# Patient Record
Sex: Female | Born: 1950 | Race: White | Hispanic: No | Smoking: Former smoker
Health system: Southern US, Community
[De-identification: ages and names within clinical notes are randomized; demographics above are authoritative.]

## PROBLEM LIST (undated history)

## (undated) DIAGNOSIS — R519 Headache, unspecified: Secondary | ICD-10-CM

## (undated) DIAGNOSIS — G459 Transient cerebral ischemic attack, unspecified: Secondary | ICD-10-CM

## (undated) DIAGNOSIS — G4730 Sleep apnea, unspecified: Secondary | ICD-10-CM

## (undated) DIAGNOSIS — F329 Major depressive disorder, single episode, unspecified: Secondary | ICD-10-CM

## (undated) DIAGNOSIS — I5030 Unspecified diastolic (congestive) heart failure: Secondary | ICD-10-CM

## (undated) DIAGNOSIS — M1990 Unspecified osteoarthritis, unspecified site: Secondary | ICD-10-CM

## (undated) DIAGNOSIS — E119 Type 2 diabetes mellitus without complications: Secondary | ICD-10-CM

## (undated) DIAGNOSIS — C801 Malignant (primary) neoplasm, unspecified: Secondary | ICD-10-CM

## (undated) DIAGNOSIS — I10 Essential (primary) hypertension: Secondary | ICD-10-CM

## (undated) DIAGNOSIS — C50919 Malignant neoplasm of unspecified site of unspecified female breast: Secondary | ICD-10-CM

## (undated) DIAGNOSIS — I4719 Other supraventricular tachycardia: Secondary | ICD-10-CM

## (undated) DIAGNOSIS — E785 Hyperlipidemia, unspecified: Secondary | ICD-10-CM

## (undated) DIAGNOSIS — I639 Cerebral infarction, unspecified: Secondary | ICD-10-CM

## (undated) DIAGNOSIS — I4891 Unspecified atrial fibrillation: Secondary | ICD-10-CM

## (undated) DIAGNOSIS — F32A Depression, unspecified: Secondary | ICD-10-CM

## (undated) DIAGNOSIS — R51 Headache: Secondary | ICD-10-CM

## (undated) DIAGNOSIS — E118 Type 2 diabetes mellitus with unspecified complications: Secondary | ICD-10-CM

## (undated) HISTORY — DX: Depression, unspecified: F32.A

## (undated) HISTORY — DX: Transient cerebral ischemic attack, unspecified: G45.9

## (undated) HISTORY — PX: CHOLECYSTECTOMY: SHX55

## (undated) HISTORY — DX: Unspecified osteoarthritis, unspecified site: M19.90

## (undated) HISTORY — DX: Hyperlipidemia, unspecified: E78.5

## (undated) HISTORY — DX: Type 2 diabetes mellitus without complications: E11.9

## (undated) HISTORY — PX: HERNIA REPAIR: SHX51

## (undated) HISTORY — DX: Sleep apnea, unspecified: G47.30

## (undated) HISTORY — DX: Unspecified atrial fibrillation: I48.91

## (undated) HISTORY — PX: WRIST SURGERY: SHX841

## (undated) HISTORY — DX: Essential (primary) hypertension: I10

## (undated) HISTORY — DX: Malignant neoplasm of unspecified site of unspecified female breast: C50.919

## (undated) HISTORY — DX: Type 2 diabetes mellitus with unspecified complications: E11.8

## (undated) HISTORY — PX: FOOT SURGERY: SHX648

## (undated) HISTORY — PX: TUBAL LIGATION: SHX77

## (undated) HISTORY — DX: Cerebral infarction, unspecified: I63.9

## (undated) HISTORY — DX: Major depressive disorder, single episode, unspecified: F32.9

## (undated) HISTORY — PX: ABDOMINAL HYSTERECTOMY: SHX81

---
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
## 2001-05-24 ENCOUNTER — Observation Stay (HOSPITAL_COMMUNITY): Admission: AD | Admit: 2001-05-24 | Discharge: 2001-05-25 | Payer: Self-pay | Admitting: *Deleted

## 2001-05-24 ENCOUNTER — Encounter: Payer: Self-pay | Admitting: *Deleted

## 2010-04-15 ENCOUNTER — Inpatient Hospital Stay (HOSPITAL_COMMUNITY): Admission: EM | Admit: 2010-04-15 | Discharge: 2010-04-19 | Payer: Self-pay | Admitting: Emergency Medicine

## 2011-01-12 LAB — COMPREHENSIVE METABOLIC PANEL
ALT: 17 U/L (ref 0–35)
AST: 19 U/L (ref 0–37)
AST: 25 U/L (ref 0–37)
Albumin: 2.8 g/dL — ABNORMAL LOW (ref 3.5–5.2)
Albumin: 3 g/dL — ABNORMAL LOW (ref 3.5–5.2)
Albumin: 3.2 g/dL — ABNORMAL LOW (ref 3.5–5.2)
Alkaline Phosphatase: 87 U/L (ref 39–117)
BUN: 9 mg/dL (ref 6–23)
BUN: 9 mg/dL (ref 6–23)
Calcium: 8.7 mg/dL (ref 8.4–10.5)
Calcium: 9 mg/dL (ref 8.4–10.5)
Chloride: 104 mEq/L (ref 96–112)
Creatinine, Ser: 0.71 mg/dL (ref 0.4–1.2)
Creatinine, Ser: 0.76 mg/dL (ref 0.4–1.2)
Creatinine, Ser: 0.91 mg/dL (ref 0.4–1.2)
GFR calc Af Amer: >60 mL/min (ref >60)
GFR calc Af Amer: >60 mL/min (ref >60)
GFR calc non Af Amer: >60 mL/min (ref >60)
Total Protein: 7.1 g/dL (ref 6.0–8.3)
Total Protein: 7.8 g/dL (ref 6.0–8.3)

## 2011-01-12 LAB — CBC
HCT: 33.1 % — ABNORMAL LOW (ref 36.0–46.0)
HCT: 33.6 % — ABNORMAL LOW (ref 36.0–46.0)
HCT: 36.9 % (ref 36.0–46.0)
HCT: 37.7 % (ref 36.0–46.0)
Hemoglobin: 11.6 g/dL — ABNORMAL LOW (ref 12.0–15.0)
Hemoglobin: 12.9 g/dL (ref 12.0–15.0)
MCH: 29.5 pg (ref 26.0–34.0)
MCHC: 33.9 g/dL (ref 30.0–36.0)
MCHC: 34 g/dL (ref 30.0–36.0)
MCHC: 34.2 g/dL (ref 30.0–36.0)
MCV: 85.7 fL (ref 78.0–100.0)
MCV: 85.7 fL (ref 78.0–100.0)
MCV: 86.3 fL (ref 78.0–100.0)
MCV: 86.5 fL (ref 78.0–100.0)
Platelets: 174 10*3/uL (ref 150–400)
Platelets: 179 10*3/uL (ref 150–400)
Platelets: 204 10*3/uL (ref 150–400)
Platelets: 223 10*3/uL (ref 150–400)
RBC: 3.95 MIL/uL (ref 3.87–5.11)
RBC: 4.4 MIL/uL (ref 3.87–5.11)
RDW: 12.9 % (ref 11.5–15.5)
RDW: 13 % (ref 11.5–15.5)
RDW: 13.1 % (ref 11.5–15.5)
RDW: 13.1 % (ref 11.5–15.5)
WBC: 16.5 10*3/uL — ABNORMAL HIGH (ref 4.0–10.5)

## 2011-01-12 LAB — CULTURE, BLOOD (ROUTINE X 2)
Culture: NO GROWTH
Report Status: 6252011

## 2011-01-12 LAB — LIPID PANEL
Cholesterol: 195 mg/dL (ref 0–200)
HDL: 41 mg/dL (ref >39)
LDL Cholesterol: 125 mg/dL — ABNORMAL HIGH (ref 0–99)
Total CHOL/HDL Ratio: 4.8 RATIO
Triglycerides: 144 mg/dL (ref <150)

## 2011-01-12 LAB — DIFFERENTIAL
Basophils Absolute: 0 10*3/uL (ref 0.0–0.1)
Basophils Absolute: 0.1 10*3/uL (ref 0.0–0.1)
Basophils Relative: 0 % (ref 0–1)
Basophils Relative: 0 % (ref 0–1)
Basophils Relative: 1 % (ref 0–1)
Eosinophils Absolute: 0 10*3/uL (ref 0.0–0.7)
Eosinophils Absolute: 0.1 10*3/uL (ref 0.0–0.7)
Eosinophils Relative: 0 % (ref 0–5)
Eosinophils Relative: 0 % (ref 0–5)
Eosinophils Relative: 2 % (ref 0–5)
Lymphocytes Relative: 17 % (ref 12–46)
Lymphocytes Relative: 9 % — ABNORMAL LOW (ref 12–46)
Lymphs Abs: 1.4 10*3/uL (ref 0.7–4.0)
Lymphs Abs: 1.8 10*3/uL (ref 0.7–4.0)
Lymphs Abs: 2.2 10*3/uL (ref 0.7–4.0)
Monocytes Absolute: 0.8 10*3/uL (ref 0.1–1.0)
Monocytes Absolute: 1 10*3/uL (ref 0.1–1.0)
Monocytes Absolute: 1.4 10*3/uL — ABNORMAL HIGH (ref 0.1–1.0)
Monocytes Relative: 11 % (ref 3–12)
Monocytes Relative: 8 % (ref 3–12)
Neutro Abs: 5.3 10*3/uL (ref 1.7–7.7)
Neutro Abs: 9.5 10*3/uL — ABNORMAL HIGH (ref 1.7–7.7)
Neutrophils Relative %: 61 % (ref 43–77)

## 2011-01-12 LAB — BASIC METABOLIC PANEL
BUN: 6 mg/dL (ref 6–23)
CO2: 28 mEq/L (ref 19–32)
Calcium: 8.8 mg/dL (ref 8.4–10.5)
Creatinine, Ser: 0.84 mg/dL (ref 0.4–1.2)
Creatinine, Ser: 0.89 mg/dL (ref 0.4–1.2)
GFR calc non Af Amer: >60 mL/min (ref >60)
Glucose, Bld: 118 mg/dL — ABNORMAL HIGH (ref 70–99)
Glucose, Bld: 147 mg/dL — ABNORMAL HIGH (ref 70–99)
Potassium: 3.5 mEq/L (ref 3.5–5.1)

## 2011-01-12 LAB — URINALYSIS, ROUTINE W REFLEX MICROSCOPIC
Glucose, UA: NEGATIVE mg/dL
Ketones, ur: 15 mg/dL — AB
Leukocytes, UA: NEGATIVE
Nitrite: NEGATIVE
Protein, ur: 100 mg/dL — AB
Specific Gravity, Urine: 1.025 (ref 1.005–1.030)
Urobilinogen, UA: 1 mg/dL (ref 0.0–1.0)
pH: 7.5 (ref 5.0–8.0)

## 2011-01-12 LAB — URINE MICROSCOPIC-ADD ON

## 2011-03-14 NOTE — Cardiovascular Report (Signed)
[place]. [facility]  Patient:    Diana Mathews Visit Number: [id number] MRN: [id number]          Service Type: Attending:  Arturo Morton. Riley Kill, M.D. [facility] Dictated by:   Arturo Morton. Riley Kill, M.D. [facility] Proc. Date: 05/25/01   CC:         CV Laboratory  Luis Abed, M.D. [facility]   Cardiac Catheterization  INDICATIONS: The patient is a 60 year old with systemic lupus, who presented in February with an acute inferior MI. She was treated with primary angioplasty with stenting. She has done well but continued to smoke. She presented tonight with acute onset of substernal chest pain associated with electrographic evidence of acute inferior wall infarction. She was transferred from the [facility] Emergency Room by [facility] to the cardiac catheterization for emergency treatment and evaluation. Risks and alternatives were discussed with the patient as she was rolled onto the table. She was agreeable to proceed.  PROCEDURES: 1. Left heart catheterization. 2. Selective coronary arteriography. 3. Selective left ventriculography. 4. Percutaneous angioplasty of the right coronary artery. 5. Temporary transvenous insertion.  DESCRIPTION OF PROCEDURE: The procedure was performed from the right femoral artery using 6 French catheters.  Initially, 6 French catheters were utilized. The patient had evidence of an occluded right coronary artery with bradycardia and first-degree AV block as well as some Mobitz I AV block. Because of this, a 6 French sheath was inserted in the right femoral vein. A temporary transvenous pacing wire was passed to the RV apex and tested and found to be appropriate. Following this, appropriate heparinization and Integrilin were given. A JR4 guiding catheter with side holes was utilized and the lesion was crossed with a 0.014 traverse guide wire. The lesion was dilated then using a 3.5 x 20 balloon and 3.5 x 30 balloon, there was a small amount of  residual thrombus noted distal to the stent site, but the RA was intact with good TIMI-3 flow. We elected not to intervene with any other device because of the lumen appeared to be widely patent. The temporary pacer was eventually removed. The patient was in atrial fibrillation, was given two doses of intravenous metoprolol, 5 mg as well as intravenous Lanoxin.  HEMODYNAMIC DATA: The central aortic pressure was 134/88, LV pressure was 128/19. There was no gradient on pullback across the aortic valve.  ANGIOGRAPHIC DATA: 1. The left main coronary artery had about 20% narrowing as previously noted. 2. The left anterior descending artery had about 20% narrowing in the    midportion of the vessel. There was mild irregularity at the ostium of the    circumflex. The LAD, ramus, and circumflex were otherwise free of    critical disease. 3. The right coronary artery was totally occluded at the proximal portion    of the stent. Distally, the vessel opened widely and provided a posterior    descending branch after reperfusion with the wire. The RCA was dilated to    less than 30% residual luminal narrowing.  VENTRICULOGRAPHY: Ventriculography in the RAO projection revealed severe inferior hypokinesis to akinesis. Overall, LV function was reduced but was done in setting of atrial fibrillation.  CONCLUSIONS: 1. Acute inferior wall infarction treated with primary percutaneous    angioplasty in a previously stented artery with subsequent acute    thrombosis. 2. Mild residual disease in the left system as noted above. 3. Systemic lupus erythematous, on multiple drug therapy.  DISPOSITION: The patient will be treated medically. Followup will  be with Dr. Myrtis Ser. Dictated by:   Arturo Morton Riley Kill, M.D. [facility] Attending:  Arturo Morton. Riley Kill, M.D. [facility] DD:  03/10/02 TD:  03/10/02 Job: [id number]

## 2011-03-14 NOTE — Discharge Summary (Signed)
[place]. [facility]  Patient:    Diana Mathews, Diana Mathews                  MRN: [id number] Adm. Date:  [id number] Disc. Date: 05/25/01 Attending:  Daisey Must Dictator:   Tereso Newcomer, P.A. CC:         Fara Chute, M.D.   Discharge Summary  DATE OF BIRTH:  1951-06-13  DISCHARGE DIAGNOSES: 1. Chest pain, etiology unclear. 2. Untreated hyperlipidemia. 3. Status post carpal tunnel surgery. 4. Status post cholecystectomy. 5. Status post hernia repair. 6. Status post tubal ligation. 7. Status post surgery to the left foot. 8. Ex-smoker. 9. Positive family history of coronary artery disease.  PROCEDURE:  Cardiac catheterization by Dr. Diona Browner on May 25, 2001 revealing no flow limiting coronary artery disease, no mitral regurgitation, no wall motion abnormalities, ejection fraction of 55%.  HOSPITAL COURSE:  This 60 year old female was admitted to [facility] on May 24, 2001 with new onset substernal chest pain.  She had been evaluated in the past for chest pain with Cardiolite in December 2001.  This test was negative with no evidence of ischemia.  She presented to [facility] with new onset chest pain.  She had pain throughout the weekend that was somewhat relieved by nitroglycerin.  She denied any exertional dyspnea or chest pain.  The patient had no EKG changes on admission but her pain on admission was relieved by two sublingual nitroglycerin.  PHYSICAL EXAMINATION  VITAL SIGNS:  Blood pressure was 123/84, pulse 78.  NECK:  Without bruits or JVD.  LUNGS:  Clear to auscultation bilaterally.  CARDIAC:  Regular rate and rhythm.  Normal S1, S2.  No murmurs, rubs, or gallops.  ABDOMEN:  Soft, nontender.  No rebound or guarding.  Good bowel sounds.  No abdominal bruits.  EXTREMITIES:  Without edema.  LABORATORIES:  EKG:  Normal sinus rhythm without acute ischemic changes. Chest x-ray:  No cardiomegaly.  No infiltrates.   Normal chest x-ray.  At [facility]:  CPK #1 50, CK-MB 1.6, troponin I 0.01.  CPK #2 89, CK-MB 0.6, troponin I 0.02.  White blood cell count 7200, hemoglobin 12.9, hematocrit 37.4, platelet count 279,000.  INR 1.2.  Sodium 134, potassium 3.6, chloride 102, CO2 27, glucose 134, BUN 15, creatinine 0.9.  The patient was transferred to [facility] for further evaluation.  On May 25, 2001 she had a cardiac catheterization performed by Dr. Diona Browner. The results are noted above.  She tolerated the procedure well, had no immediate complications.  Since her cardiac catheterization was negative, it was felt she was stable enough for discharge to home once her rest period was complete.  She would need to follow-up with her primary care physician, Dr. Neita Carp.  The patient does note indigestion at nighttime.  She denies any water brash symptoms or increased belching.  She does drink a lot of caffeine and carbonated sodas.  She is being sent home today with a prescription for Protonix 40 mg q.d.  She will possibly need a GI evaluation with her primary care physician.  DISCHARGE MEDICATIONS: 1. Ambien p.r.n. 2. Naprosyn p.r.n. 3. Protonix 40 mg q.d.  ACTIVITY:  No driving, heavy lifting, exertion, or sex for three days.  DIET:  Low fat, low sodium.  WOUND CARE:  The patient should call our office in [facility] for any groin swelling, bleeding, or bruising.  FOLLOW-UP:  She should follow-up with Dr. Neita Carp in one to two weeks.  She should call for an appointment.  She should follow-up with Dr. Jens Som only as needed. DD:  05/25/01 TD:  05/25/01 Job: [id number]

## 2011-03-14 NOTE — Cardiovascular Report (Signed)
[place]. [facility]  Patient:    Diana Mathews, Diana Mathews                  MRN: [id number] Proc. Date: 05/25/01 Adm. Date:  [id number] Attending:  Daisey Must                        Cardiac Catheterization  DATE OF BIRTH:  1951-09-24  REFERRING PHYSICIAN:  Fara Chute, M.D.  Corinda Gubler CARDIOLOGIST:  Lewayne Bunting, M.D.  INDICATIONS:  The patient is a 60 year old woman, with a history of hypertension, who presents with progressive chest discomfort.  She underwent a myocardial perfusion study approximately six months ago that was normal, but her symptoms have progressed since that time and she is now referred for cardiac catheterization to define her coronary anatomy.  PROCEDURES PERFORMED:  Left heart catheterization, coronary angiography, left ventriculography.  DESCRIPTION OF PROCEDURE:  After informed consent was obtained, the patient as taken to the cardiac catheterization lab.  She was prepped and draped in the usual sterile fashion and the area about the right femoral artery was anesthetized with 1% lidocaine.  A 6 French sheath was placed in the femoral artery via the modified Seldinger technique and selective coronary angiography was performed using JR4 and JL4 catheters.  A left ventriculogram was performed using a angled pigtail catheter.  The patient tolerated the procedure well without obvious complications.  HEMODYNAMICS:  LV 135/19 (post angiography), aorta 135/75.  ANGIOGRAPHIC FINDINGS: 1. The left main is free of significant flow-limiting coronary artery disease. 2. The left anterior descending is a medium caliber vessel with one    large diagonal branch.  There is no significant flow-limiting coronary    artery disease. 3. The circumflex coronary artery is a medium caliber vessel with two    obtuse marginal branches.  There is also a large atrial branch.  There is    no evidence of significant flow-limiting coronary artery  disease. 4. The right coronary artery is a large caliber vessel with a posterior    descending branch and a large posterolateral branch.  There is a small    area of kinking noted at the site of catheter engagement that may represent    an area of minor luminal irregularity at approximately 20% stenosis.  There    is no evidence of significant flow-limiting coronary artery disease.  LEFT VENTRICULOGRAM:  The left ventriculogram reveals a normal left ventricular ejection fraction of approximately 55% with no focal wall motion abnormalities.  There is no evidence of significant mitral regurgitation.  CONCLUSIONS AND RECOMMENDATIONS: 1. No evidence of significant flow-limiting coronary artery disease in the    major epicardial vessels. 2. Normal left ventricular contraction with an estimated ejection fraction of    55% and no focal wall motion abnormalities.  The left ventricular    end-diastolic pressure is elevated at 19 mmHg post angiography.  There is    no evidence of significant mitral regurgitation. 3. Would consider a possible gastroesophageal etiologies in terms of the    patients symptomatology. DD:  05/25/01 TD:  05/25/01 Job: [id number]

## 2016-04-15 ENCOUNTER — Ambulatory Visit (INDEPENDENT_AMBULATORY_CARE_PROVIDER_SITE_OTHER): Payer: BLUE CROSS/BLUE SHIELD | Admitting: Family Medicine

## 2016-04-15 ENCOUNTER — Encounter: Payer: Self-pay | Admitting: Family Medicine

## 2016-04-15 VITALS — BP 130/58 | HR 63 | Temp 98.3°F | Ht 64.0 in | Wt 261.0 lb

## 2016-04-15 DIAGNOSIS — M26621 Arthralgia of right temporomandibular joint: Secondary | ICD-10-CM

## 2016-04-15 DIAGNOSIS — F39 Unspecified mood [affective] disorder: Secondary | ICD-10-CM

## 2016-04-15 DIAGNOSIS — G4700 Insomnia, unspecified: Secondary | ICD-10-CM

## 2016-04-15 DIAGNOSIS — J040 Acute laryngitis: Secondary | ICD-10-CM

## 2016-04-15 DIAGNOSIS — I10 Essential (primary) hypertension: Secondary | ICD-10-CM | POA: Diagnosis not present

## 2016-04-15 MED ORDER — ZOLPIDEM TARTRATE 5 MG PO TABS: 5.0000 mg | ORAL_TABLET | Freq: Every evening | ORAL | Status: DC | PRN

## 2016-04-15 MED ORDER — METHYLPREDNISOLONE ACETATE 80 MG/ML IJ SUSP
80.0000 mg | Freq: Once | INTRAMUSCULAR | Status: AC
  Administered 2016-04-15: 80 mg via INTRAMUSCULAR

## 2016-04-15 NOTE — Patient Instructions (Signed)
Great to meet you!  I have given you the shot of steroids for your TMJ pain and the laryngitis, please come back or call if these get worse or do not resolve as expected  Temporomandibular Joint Syndrome Temporomandibular joint (TMJ) syndrome is a condition that affects the joints between your jaw and your skull. The TMJs are located near your ears and allow your jaw to open and close. These joints and the nearby muscles are involved in all movements of the jaw. People with TMJ syndrome have pain in the area of these joints and muscles. Chewing, biting, or other movements of the jaw can be difficult or painful. TMJ syndrome can be caused by various things. In many cases, the condition is mild and goes away within a few weeks. For some people, the condition can become a long-term problem. CAUSES Possible causes of TMJ syndrome include:  Grinding your teeth or clenching your jaw. Some people do this when they are under stress.  Arthritis.  Injury to the jaw.  Head or neck injury.  Teeth or dentures that are not aligned well. In some cases, the cause of TMJ syndrome may not be known. SIGNS AND SYMPTOMS The most common symptom is an aching pain on the side of the head in the area of the TMJ. Other symptoms may include:  Pain when moving your jaw, such as when chewing or biting.  Being unable to open your jaw all the way.  Making a clicking sound when you open your mouth.  Headache.  Earache.  Neck or shoulder pain. DIAGNOSIS Diagnosis can usually be made based on your symptoms, your medical history, and a physical exam. Your health care provider may check the range of motion of your jaw. Imaging tests, such as X-rays or an MRI, are sometimes done. You may need to see your dentist to determine if your teeth and jaw are lined up correctly. TREATMENT TMJ syndrome often goes away on its own. If treatment is needed, the options may include:  Eating soft foods and applying ice or  heat.  Medicines to relieve pain or inflammation.  Medicines to relax the muscles.  A splint, bite plate, or mouthpiece to prevent teeth grinding or jaw clenching.  Relaxation techniques or counseling to help reduce stress.  Transcutaneous electrical nerve stimulation (TENS). This helps to relieve pain by applying an electrical current through the skin.  Acupuncture. This is sometimes helpful to relieve pain.  Jaw surgery. This is rarely needed. HOME CARE INSTRUCTIONS  Take medicines only as directed by your health care provider.  Eat a soft diet if you are having trouble chewing.  Apply ice to the painful area.  Put ice in a plastic bag.  Place a towel between your skin and the bag.  Leave the ice on for 20 minutes, 2-3 times a day.  Apply a warm compress to the painful area as directed.  Massage your jaw area and perform any jaw stretching exercises as recommended by your health care provider.  If you were given a mouthpiece or bite plate, wear it as directed.  Avoid foods that require a lot of chewing. Do not chew gum.  Keep all follow-up visits as directed by your health care provider. This is important. SEEK MEDICAL CARE IF:  You are having trouble eating.  You have new or worsening symptoms. SEEK IMMEDIATE MEDICAL CARE IF:  Your jaw locks open or closed.   This information is not intended to replace advice given to you  by your health care provider. Make sure you discuss any questions you have with your health care provider.   Document Released: 07/08/2001 Document Revised: 11/03/2014 Document Reviewed: 05/18/2014     Laryngitis Laryngitis is inflammation of your vocal cords. This causes hoarseness, coughing, loss of voice, sore throat, or a dry throat. Your vocal cords are two bands of muscles that are found in your throat. When you speak, these cords come together and vibrate. These vibrations come out through your mouth as sound. When your vocal cords  are inflamed, your voice sounds different. Laryngitis can be temporary (acute) or long-term (chronic). Most cases of acute laryngitis improve with time. Chronic laryngitis is laryngitis that lasts for more than three weeks. CAUSES Acute laryngitis may be caused by:  A viral infection.  Lots of talking, yelling, or singing. This is also called vocal strain.  Bacterial infections. Chronic laryngitis may be caused by:  Vocal strain.  Injury to your vocal cords.  Acid reflux (gastroesophageal reflux disease or GERD).  Allergies.  Sinus infection.  Smoking.  Alcohol abuse.  Breathing in chemicals or dust.  Growths on the vocal cords. RISK FACTORS Risk factors for laryngitis include:  Smoking.  Alcohol abuse.  Having allergies. SIGNS AND SYMPTOMS Symptoms of laryngitis may include:  Low, hoarse voice.  Loss of voice.  Dry cough.  Sore throat.  Stuffy nose. DIAGNOSIS Laryngitis may be diagnosed by:  Physical exam.  Throat culture.  Blood test.  Laryngoscopy. This procedure allows your health care provider to look at your vocal cords with a mirror or viewing tube. TREATMENT Treatment for laryngitis depends on what is causing it. Usually, treatment involves resting your voice and using medicines to soothe your throat. However, if your laryngitis is caused by a bacterial infection, you may need to take antibiotic medicine. If your laryngitis is caused by a growth, you may need to have a procedure to remove it. HOME CARE INSTRUCTIONS  Drink enough fluid to keep your urine clear or pale yellow.  Breathe in moist air. Use a humidifier if you live in a dry climate.  Take medicines only as directed by your health care provider.  If you were prescribed an antibiotic medicine, finish it all even if you start to feel better.  Do not smoke cigarettes or electronic cigarettes. If you need help quitting, ask your health care provider.  Talk as little as possible.  Also avoid whispering, which can cause vocal strain.  Write instead of talking. Do this until your voice is back to normal. SEEK MEDICAL CARE IF:  You have a fever.  You have increasing pain.  You have difficulty swallowing. SEEK IMMEDIATE MEDICAL CARE IF:  You cough up blood.  You have trouble breathing.   This information is not intended to replace advice given to you by your health care provider. Make sure you discuss any questions you have with your health care provider.   Document Released: 10/13/2005 Document Revised: 11/03/2014 Document Reviewed: 03/28/2014 Elsevier Interactive Patient Education 2016 [employer].  Chartered certified accountant Patient Education [employer].

## 2016-04-15 NOTE — Progress Notes (Signed)
HPI  Patient presents today here to establish care with laryngitis and ear pain.  Hoarseness Is been going on for 9-10 days Does not seem to be getting better.  She's been seen in urgent care and treated with azithromycin with no improvement.  Ear pain Has been going on for about 2-3 months, worse in the morning and associated with dizziness. Describes her dizziness as unsteadiness, no room spinning. No headaches, shortness of breath, or history of stroke.  Hypertension Good medication compliance No chest pain, shortness of breath  Mood disorder Not really helped by fluoxetine  Insomnia Has been taking 5 mg of Ambien for years  PMH: Smoking status noted Past medical history significant for arthritis, depression, sleep apnea, and hypertension Surgical history positive for abdominal hysterectomy and cholecystectomy Family history positive for diabetes in mother, brother, and sister, brother with mental illness She is a never smoker ROS: Per HPI  Objective: BP 130/58 mmHg  Pulse 63  Temp(Src) 98.3 F (36.8 C) (Oral)  Ht 5\' 4"  (1.626 m)  Wt 261 lb (118.389 kg)  BMI 44.78 kg/m2 Gen: NAD, alert, cooperative with exam HEENT: NCAT, tenderness to palpation of right TMJ with mouth opening, TMs normal bilaterally, EOMI, PERRLA CV: RRR, good S1/S2, no murmur Resp: CTABL, no wheezes, non-labored Ext: No edema, warm Neuro: Alert and oriented,  strength 5/5 and sensation intact in bilateral upper and lower extremities, cranial nerves II through XII intact, normal gait   Depression screen PHQ 2/9 04/15/2016  Decreased Interest 0  Down, Depressed, Hopeless 0  PHQ - 2 Score 0      Assessment and plan:  # Temporomandibular joint arthralgia Discussed usual course of illness Discussed options of steroids, which may help her laryngitis, versus NSAIDs and ice She would like systemic steroids No contraindications obvious for systemic steroids   # Laryngitis No improvement  with azithromycin, discussed likely viral etiology Given 9 days with no improvement I have offered systemic steroids today IM Depo-Medrol   # Hypertension Well-controlled with metoprolol  # Insomnia Refilled Ambien, she's been using this for years     Meds ordered this encounter  Medications  . Multiple Vitamin (MULTIVITAMIN) tablet    Sig: Take 1 tablet by mouth daily.  . calcium-vitamin D (OSCAL WITH D) 500-200 MG-UNIT tablet    Sig: Take 1 tablet by mouth.  . Omega-3 Fatty Acids (FISH OIL) 1200 MG CAPS    Sig: Take 1,200 mg by mouth 2 (two) times daily.  . Black Cohosh 40 MG CAPS    Sig: Take 120 mg by mouth daily.  . vitamin B-12 (CYANOCOBALAMIN) 1000 MCG tablet    Sig: Take 1,000 mcg by mouth daily.  Marland Kitchen aspirin 81 MG tablet    Sig: Take 81 mg by mouth daily.  Marland Kitchen acetaminophen (TYLENOL) 500 MG tablet    Sig: Take 500 mg by mouth every 6 (six) hours as needed.  Marland Kitchen DISCONTD: zolpidem (AMBIEN) 5 MG tablet    Sig: Take 5 mg by mouth at bedtime as needed for sleep.  . cetirizine (ZYRTEC) 10 MG tablet    Sig: Take 10 mg by mouth at bedtime.  . metoprolol (LOPRESSOR) 50 MG tablet    Sig: Take 50 mg by mouth 2 (two) times daily.  Marland Kitchen FLUoxetine (PROZAC) 40 MG capsule    Sig: Take 40 mg by mouth daily.  . methylPREDNISolone acetate (DEPO-MEDROL) injection 80 mg    Sig:   . zolpidem (AMBIEN) 5 MG tablet  Sig: Take 1 tablet (5 mg total) by mouth at bedtime as needed for sleep.    Dispense:  30 tablet    Refill:  [place], MD [facility] 04/15/2016, 2:58 PM

## 2016-04-17 ENCOUNTER — Telehealth: Payer: Self-pay | Admitting: Family Medicine

## 2016-04-17 NOTE — Telephone Encounter (Signed)
Patient aware.

## 2016-04-17 NOTE — Telephone Encounter (Signed)
May need to be seen again if her symptoms are severe. Would recommend chest x ray if she returns.   Otherwise would expect gradual improvement over th enetx 3-5 days.   Laroy Apple, MD [facility] 04/17/2016, 1:27 PM

## 2016-05-16 ENCOUNTER — Telehealth: Payer: Self-pay | Admitting: Family Medicine

## 2016-05-16 ENCOUNTER — Other Ambulatory Visit: Payer: Self-pay | Admitting: *Deleted

## 2016-05-19 NOTE — Telephone Encounter (Signed)
Patient called and told 2 refills from 04/15/16  [facility] and straightened out

## 2016-05-26 ENCOUNTER — Telehealth: Payer: Self-pay | Admitting: Family Medicine

## 2016-05-26 MED ORDER — METOPROLOL TARTRATE 50 MG PO TABS: 50.0000 mg | ORAL_TABLET | Freq: Two times a day (BID) | ORAL | 1 refills | Status: DC

## 2016-06-06 ENCOUNTER — Ambulatory Visit (INDEPENDENT_AMBULATORY_CARE_PROVIDER_SITE_OTHER): Payer: BLUE CROSS/BLUE SHIELD | Admitting: Family Medicine

## 2016-06-06 ENCOUNTER — Encounter: Payer: Self-pay | Admitting: Family Medicine

## 2016-06-06 VITALS — BP 138/66 | HR 54 | Temp 97.7°F | Ht 64.0 in | Wt 250.8 lb

## 2016-06-06 DIAGNOSIS — L089 Local infection of the skin and subcutaneous tissue, unspecified: Secondary | ICD-10-CM

## 2016-06-06 DIAGNOSIS — L98492 Non-pressure chronic ulcer of skin of other sites with fat layer exposed: Secondary | ICD-10-CM

## 2016-06-06 MED ORDER — TRAMADOL HCL 50 MG PO TABS: 50.0000 mg | ORAL_TABLET | Freq: Three times a day (TID) | ORAL | 0 refills | Status: DC | PRN

## 2016-06-06 MED ORDER — DOXYCYCLINE HYCLATE 100 MG PO TABS: 100.0000 mg | ORAL_TABLET | Freq: Two times a day (BID) | ORAL | 0 refills | Status: DC

## 2016-06-06 NOTE — Progress Notes (Signed)
BP 138/66 (BP Location: Left Arm, Patient Position: Sitting, Cuff Size: Large)   Pulse (!) 54   Temp 97.7 F (36.5 C) (Oral)   Ht 5\' 4"  (1.626 m)   Wt 250 lb 12.8 oz (113.8 kg)   BMI 43.05 kg/m    Subjective:    Patient ID: Diana Mathews, female    DOB: 04-28-1951, 65 y.o.   MRN: [id number]  HPI: Diana Mathews is a 65 y.o. female presenting on 06/06/2016 for Insect Bite (happened last week - to right side of stomach, area is red and inflammed, draining)   HPI Insect bite and chills and aches Patient is coming today because she is having generalized aches and chills and not feeling well and she has an insect bite on her right abdomen that has been growing worse and she has been using some topical antibiotic on it to get some improvement but she is still just generally not feeling well. She does not know what bit her but she started to develop an ulceration and redness in her site. She says she was bitten about a week and a half to 2 weeks ago.  Relevant past medical, surgical, family and social history reviewed and updated as indicated. Interim medical history since our last visit reviewed. Allergies and medications reviewed and updated.  Review of Systems  Constitutional: Negative for chills and fever.  HENT: Negative for congestion, ear discharge and ear pain.   Eyes: Negative for redness and visual disturbance.  Respiratory: Negative for chest tightness and shortness of breath.   Cardiovascular: Negative for chest pain and leg swelling.  Genitourinary: Negative for difficulty urinating and dysuria.  Musculoskeletal: Negative for back pain and gait problem.  Skin: Positive for color change and wound. Negative for rash.  Neurological: Negative for light-headedness and headaches.  Psychiatric/Behavioral: Negative for agitation and behavioral problems.  All other systems reviewed and are negative.   Per HPI unless specifically indicated above     Medication List      Accurate as of 06/06/16  8:48 AM. Always use your most recent med list.          ALEVE 220 MG Caps Generic drug:  Naproxen Sodium Take 220 mg by mouth 2 (two) times daily.   aspirin 81 MG tablet Take 81 mg by mouth daily.   Black Cohosh 40 MG Caps Take 120 mg by mouth daily.   calcium-vitamin D 500-200 MG-UNIT tablet Commonly known as:  OSCAL WITH D Take 1 tablet by mouth.   cetirizine 10 MG tablet Commonly known as:  ZYRTEC Take 10 mg by mouth at bedtime.   doxycycline 100 MG tablet Commonly known as:  VIBRA-TABS Take 1 tablet (100 mg total) by mouth 2 (two) times daily.   Fish Oil 1200 MG Caps Take 1,200 mg by mouth 2 (two) times daily.   FLUoxetine 40 MG capsule Commonly known as:  PROZAC Take 40 mg by mouth daily.   metoprolol 50 MG tablet Commonly known as:  LOPRESSOR Take 1 tablet (50 mg total) by mouth 2 (two) times daily.   multivitamin tablet Take 1 tablet by mouth daily.   traMADol 50 MG tablet Commonly known as:  ULTRAM Take 1 tablet (50 mg total) by mouth every 8 (eight) hours as needed.   vitamin B-12 1000 MCG tablet Commonly known as:  CYANOCOBALAMIN Take 1,000 mcg by mouth daily.   zolpidem 5 MG tablet Commonly known as:  AMBIEN Take 1 tablet (5 mg total) by  mouth at bedtime as needed for sleep.          Objective:    BP 138/66 (BP Location: Left Arm, Patient Position: Sitting, Cuff Size: Large)   Pulse (!) 54   Temp 97.7 F (36.5 C) (Oral)   Ht 5\' 4"  (1.626 m)   Wt 250 lb 12.8 oz (113.8 kg)   BMI 43.05 kg/m   Wt Readings from Last 3 Encounters:  06/06/16 250 lb 12.8 oz (113.8 kg)  04/15/16 261 lb (118.4 kg)    Physical Exam  Constitutional: She is oriented to person, place, and time. She appears well-developed and well-nourished. No distress.  Eyes: Conjunctivae and EOM are normal. Pupils are equal, round, and reactive to light.  Cardiovascular: Normal rate, regular rhythm, normal heart sounds and intact distal pulses.   No  murmur heard. Pulmonary/Chest: Effort normal and breath sounds normal. No respiratory distress. She has no wheezes.  Musculoskeletal: Normal range of motion. She exhibits no edema or tenderness.  Neurological: She is alert and oriented to person, place, and time. Coordination normal.  Skin: Skin is warm and dry. Lesion (Patient has a 2.5 cm area of ulceration with surrounding erythema and induration and the ulceration appears to go at least centimeter deep. Some purulent drainage is noted.) noted. No rash noted. She is not diaphoretic. There is erythema.  Psychiatric: She has a normal mood and affect. Her behavior is normal.  Nursing note and vitals reviewed.     Assessment & Plan:   Problem List Items Addressed This Visit    None    Visit Diagnoses    Infected ulcer of skin, with fat layer exposed ([place])    -  Primary   Right abdominal wall, will do antibiotic for 1 week and if not improving may have to send to surgery   Relevant Medications   doxycycline (VIBRA-TABS) 100 MG tablet   traMADol (ULTRAM) 50 MG tablet       Follow up plan: Return in about 7 days (around 06/13/2016), or if symptoms worsen or fail to improve, for Follow-up skin ulcer.  Counseling provided for all of the vaccine components No orders of the defined types were placed in this encounter.   Caryl Pina, MD [facility] 06/06/2016, 8:48 AM

## 2016-06-11 ENCOUNTER — Telehealth: Payer: Self-pay | Admitting: Family Medicine

## 2016-06-11 NOTE — Telephone Encounter (Signed)
Pt aware.

## 2016-06-11 NOTE — Telephone Encounter (Signed)
Tell her she can take 2, double the dose. No pain medication will fully relieve the pain but this will hopefully just take the edge off.

## 2016-06-17 ENCOUNTER — Encounter: Payer: Self-pay | Admitting: Family Medicine

## 2016-06-17 ENCOUNTER — Ambulatory Visit (INDEPENDENT_AMBULATORY_CARE_PROVIDER_SITE_OTHER): Payer: BLUE CROSS/BLUE SHIELD | Admitting: Family Medicine

## 2016-06-17 VITALS — BP 135/74 | HR 68 | Temp 98.0°F | Ht 64.0 in | Wt 254.6 lb

## 2016-06-17 DIAGNOSIS — L98492 Non-pressure chronic ulcer of skin of other sites with fat layer exposed: Secondary | ICD-10-CM | POA: Diagnosis not present

## 2016-06-17 MED ORDER — TRAMADOL HCL 50 MG PO TABS: 50.0000 mg | ORAL_TABLET | Freq: Three times a day (TID) | ORAL | 0 refills | Status: DC | PRN

## 2016-06-17 NOTE — Progress Notes (Signed)
BP 135/74 (BP Location: Left Arm, Patient Position: Sitting, Cuff Size: Large)   Pulse 68   Temp 98 F (36.7 C) (Oral)   Ht 5\' 4"  (1.626 m)   Wt 254 lb 9.6 oz (115.5 kg)   BMI 43.70 kg/m    Subjective:    Patient ID: Diana Mathews, female    DOB: [birth year], 65 y.o.   MRN: [id number]  HPI: Diana Mathews is a 65 y.o. female presenting on 06/17/2016 for Followup skin ulcer of abdomen (finished Doxycycline yesterday)   HPI Wound care recheck Patient has significantly reduced in amount of redness and warmth and drainage coming out of it. She still has a small ulceration although smaller than it was previously on her right lower abdomen. She denies any fevers or chills. She denies any skin changes or rashes anywhere else. She finished the doxycycline just yesterday.  Relevant past medical, surgical, family and social history reviewed and updated as indicated. Interim medical history since our last visit reviewed. Allergies and medications reviewed and updated.  Review of Systems  Constitutional: Negative for chills and fever.  HENT: Negative for congestion, ear discharge and ear pain.   Eyes: Negative for redness and visual disturbance.  Respiratory: Negative for chest tightness and shortness of breath.   Cardiovascular: Negative for chest pain and leg swelling.  Genitourinary: Negative for difficulty urinating and dysuria.  Musculoskeletal: Negative for back pain and gait problem.  Skin: Positive for color change and wound. Negative for rash.  Neurological: Negative for light-headedness and headaches.  Psychiatric/Behavioral: Negative for agitation and behavioral problems.  All other systems reviewed and are negative.   Per HPI unless specifically indicated above     Medication List       Accurate as of 06/17/16  8:33 AM. Always use your most recent med list.          ALEVE 220 MG Caps Generic drug:  Naproxen Sodium Take 220 mg by mouth 2 (two) times daily.     aspirin 81 MG tablet Take 81 mg by mouth daily.   Black Cohosh 40 MG Caps Take 120 mg by mouth daily.   calcium-vitamin D 500-200 MG-UNIT tablet Commonly known as:  OSCAL WITH D Take 1 tablet by mouth.   cetirizine 10 MG tablet Commonly known as:  ZYRTEC Take 10 mg by mouth at bedtime.   Fish Oil 1200 MG Caps Take 1,200 mg by mouth 2 (two) times daily.   FLUoxetine 40 MG capsule Commonly known as:  PROZAC Take 40 mg by mouth daily.   metoprolol 50 MG tablet Commonly known as:  LOPRESSOR Take 1 tablet (50 mg total) by mouth 2 (two) times daily.   multivitamin tablet Take 1 tablet by mouth daily.   traMADol 50 MG tablet Commonly known as:  ULTRAM Take 1 tablet (50 mg total) by mouth every 8 (eight) hours as needed.   vitamin B-12 1000 MCG tablet Commonly known as:  CYANOCOBALAMIN Take 1,000 mcg by mouth daily.   zolpidem 5 MG tablet Commonly known as:  AMBIEN Take 1 tablet (5 mg total) by mouth at bedtime as needed for sleep.          Objective:    BP 135/74 (BP Location: Left Arm, Patient Position: Sitting, Cuff Size: Large)   Pulse 68   Temp 98 F (36.7 C) (Oral)   Ht 5\' 4"  (1.626 m)   Wt 254 lb 9.6 oz (115.5 kg)   BMI 43.70 kg/m  Wt Readings from Last 3 Encounters:  06/17/16 254 lb 9.6 oz (115.5 kg)  06/06/16 250 lb 12.8 oz (113.8 kg)  04/15/16 261 lb (118.4 kg)    Physical Exam  Constitutional: She is oriented to person, place, and time. She appears well-developed and well-nourished. No distress.  Eyes: Conjunctivae and EOM are normal. Pupils are equal, round, and reactive to light.  Cardiovascular: Normal rate, regular rhythm, normal heart sounds and intact distal pulses.   No murmur heard. Pulmonary/Chest: Effort normal and breath sounds normal. No respiratory distress. She has no wheezes.  Musculoskeletal: Normal range of motion. She exhibits no edema or tenderness.  Neurological: She is alert and oriented to person, place, and time.  Coordination normal.  Skin: Skin is warm and dry. Lesion (Patient has a 2cm area of ulceration with no surrounding erythema and is 0.5 centimeter deep. No purulent drainage is noted.) noted. No rash noted. She is not diaphoretic. There is erythema.  Psychiatric: She has a normal mood and affect. Her behavior is normal.  Nursing note and vitals reviewed.   Wound care: Place petroleum jelly covering/Xeroform and a nonstick gauze and tape that place. Change every 24 hours, return sooner if worsens.    Assessment & Plan:   Problem List Items Addressed This Visit    None    Visit Diagnoses    Skin ulcer, stage 3, with fat layer exposed ([place])    -  Primary   abdomen, infection cleared, patient given instructions for wound care   Relevant Medications   traMADol (ULTRAM) 50 MG tablet       Follow up plan: Return in about 2 weeks (around 07/01/2016), or if symptoms worsen or fail to improve, for Wound recheck.  Counseling provided for all of the vaccine components No orders of the defined types were placed in this encounter.   Caryl Pina, MD [facility] 06/17/2016, 8:33 AM

## 2016-07-02 ENCOUNTER — Encounter: Payer: Self-pay | Admitting: Family Medicine

## 2016-07-02 ENCOUNTER — Ambulatory Visit (INDEPENDENT_AMBULATORY_CARE_PROVIDER_SITE_OTHER): Payer: BLUE CROSS/BLUE SHIELD | Admitting: Family Medicine

## 2016-07-02 VITALS — BP 134/70 | HR 69 | Temp 97.3°F | Ht 64.0 in | Wt 255.0 lb

## 2016-07-02 DIAGNOSIS — G5601 Carpal tunnel syndrome, right upper limb: Secondary | ICD-10-CM | POA: Diagnosis not present

## 2016-07-02 DIAGNOSIS — L98492 Non-pressure chronic ulcer of skin of other sites with fat layer exposed: Secondary | ICD-10-CM | POA: Diagnosis not present

## 2016-07-02 NOTE — Progress Notes (Signed)
BP 134/70   Pulse 69   Temp 97.3 F (36.3 C) (Oral)   Ht 5\' 4"  (1.626 m)   Wt 255 lb (115.7 kg)   BMI 43.77 kg/m    Subjective:    Patient ID: Diana Mathews, female    DOB: [birth year], 65 y.o.   MRN: [id number]  HPI: Diana Mathews is a 65 y.o. female presenting on 07/02/2016 for Followup skin ulcer of abdomen and Pain and swelling in palm of right hand (no known injury)   HPI Recheck on skin ulcer of abdomen Patient is coming in for recheck on the wound on her abdomen. It has shrunken in size and has almost no depth now and she has continued to use Xeroform gauze. She denies any drainage out of it. It still is quite tender and irritated and is very pruritic now because of its healing. She denies any fevers or chills or ulcerations go anywhere else.  Hand pain right Patient has pain in her right hand over the palm on her thumb side. She also has some tingling going into her first 3 fingers on the right hand. She has had a known history of carpal tunnel syndrome and has had surgery bilaterally. This started flaring up within the past week. For work on a day-to-day basis she does a lot of housework and vacuuming and pushing and pulling things with that hand. This is her dominant hand. She denies any warmth or overlying skin changes. She denies any weakness  Relevant past medical, surgical, family and social history reviewed and updated as indicated. Interim medical history since our last visit reviewed. Allergies and medications reviewed and updated.  Review of Systems  Constitutional: Negative for chills and fever.  HENT: Negative for congestion, ear discharge and ear pain.   Eyes: Negative for redness and visual disturbance.  Respiratory: Negative for chest tightness and shortness of breath.   Cardiovascular: Negative for chest pain and leg swelling.  Genitourinary: Negative for difficulty urinating and dysuria.  Musculoskeletal: Positive for myalgias.  Skin: Positive for  color change and wound. Negative for rash.  Neurological: Positive for numbness. Negative for weakness, light-headedness and headaches.  Psychiatric/Behavioral: Negative for agitation and behavioral problems.  All other systems reviewed and are negative.   Per HPI unless specifically indicated above      Objective:    BP 134/70   Pulse 69   Temp 97.3 F (36.3 C) (Oral)   Ht 5\' 4"  (1.626 m)   Wt 255 lb (115.7 kg)   BMI 43.77 kg/m   Wt Readings from Last 3 Encounters:  07/02/16 255 lb (115.7 kg)  06/17/16 254 lb 9.6 oz (115.5 kg)  06/06/16 250 lb 12.8 oz (113.8 kg)    Physical Exam  Constitutional: She is oriented to person, place, and time. She appears well-developed and well-nourished. No distress.  Eyes: Conjunctivae are normal.  Cardiovascular: Normal rate, regular rhythm, normal heart sounds and intact distal pulses.   No murmur heard. Pulmonary/Chest: Effort normal and breath sounds normal. No respiratory distress. She has no wheezes.  Musculoskeletal: Normal range of motion. She exhibits no edema or tenderness.       Hands: Neurological: She is alert and oriented to person, place, and time. Coordination normal.  Skin: Skin is warm and dry. Lesion noted. No rash noted. She is not diaphoretic.     Psychiatric: She has a normal mood and affect. Her behavior is normal.  Nursing note and vitals reviewed.  Assessment & Plan:   Problem List Items Addressed This Visit    None    Visit Diagnoses    Skin ulcer, stage 3, with fat layer exposed ([place])    -  Primary   Half of the size that it was, continue using petroleum jelly in trouble antibiotic and keeping covered, return in 2 weeks   Carpal tunnel syndrome on right       Shooting pain into palm on thumb side with numbness in the first 3 fingers, use ice, splint, naproxen       Follow up plan: Return if symptoms worsen or fail to improve.  Counseling provided for all of the vaccine components No orders of the  defined types were placed in this encounter.   Caryl Pina, MD [facility] 07/02/2016, 8:44 AM

## 2016-07-14 ENCOUNTER — Other Ambulatory Visit: Payer: Self-pay

## 2016-07-14 MED ORDER — ZOLPIDEM TARTRATE 5 MG PO TABS: 5.0000 mg | ORAL_TABLET | Freq: Every evening | ORAL | 2 refills | Status: DC | PRN

## 2016-07-17 ENCOUNTER — Ambulatory Visit: Payer: BLUE CROSS/BLUE SHIELD | Admitting: Family Medicine

## 2016-07-25 ENCOUNTER — Ambulatory Visit (INDEPENDENT_AMBULATORY_CARE_PROVIDER_SITE_OTHER): Payer: BLUE CROSS/BLUE SHIELD | Admitting: Family Medicine

## 2016-07-25 ENCOUNTER — Encounter: Payer: Self-pay | Admitting: Family Medicine

## 2016-07-25 VITALS — BP 138/62 | HR 64 | Temp 97.4°F | Ht 64.0 in | Wt 251.0 lb

## 2016-07-25 DIAGNOSIS — G5601 Carpal tunnel syndrome, right upper limb: Secondary | ICD-10-CM | POA: Diagnosis not present

## 2016-07-25 DIAGNOSIS — L98499 Non-pressure chronic ulcer of skin of other sites with unspecified severity: Secondary | ICD-10-CM | POA: Diagnosis not present

## 2016-07-25 DIAGNOSIS — Z23 Encounter for immunization: Secondary | ICD-10-CM

## 2016-07-25 DIAGNOSIS — I10 Essential (primary) hypertension: Secondary | ICD-10-CM

## 2016-07-25 DIAGNOSIS — F39 Unspecified mood [affective] disorder: Secondary | ICD-10-CM

## 2016-07-25 DIAGNOSIS — M6530 Trigger finger, unspecified finger: Secondary | ICD-10-CM | POA: Diagnosis not present

## 2016-07-25 MED ORDER — METOPROLOL TARTRATE 50 MG PO TABS: 50.0000 mg | ORAL_TABLET | Freq: Two times a day (BID) | ORAL | 3 refills | Status: DC

## 2016-07-25 MED ORDER — ARIPIPRAZOLE 5 MG PO TABS: 5.0000 mg | ORAL_TABLET | Freq: Every day | ORAL | 3 refills | Status: DC

## 2016-07-25 MED ORDER — FLUOXETINE HCL 40 MG PO CAPS: 40.0000 mg | ORAL_CAPSULE | Freq: Every day | ORAL | 3 refills | Status: DC

## 2016-07-25 MED ORDER — CETIRIZINE HCL 10 MG PO TABS: 10.0000 mg | ORAL_TABLET | Freq: Every day | ORAL | 3 refills | Status: DC

## 2016-07-25 NOTE — Progress Notes (Signed)
   HPI  Patient presents today here for follow-up of chronic medical conditions.  Carpal tunnel syndrome Only mild improvement with cock-up splint, persistent symptoms in the right hand of the first through third fingers Described as pins and needle type pain. Feels that she has a swelling over the radial side of her wrist as well.  Trigger finger Intermittent for a few months on the right third finger.  Hypertension Good medication compliance, needs metoprolol refill.  Anxiety States that her "nerves have been really bad" for a few weeks. Also has severe depression. Denies suicidal thoughts Good Prozac compliance, not helping as much as it was previously  abd wall ulcer Healing, tolerating Neosporin well.  PMH: Smoking status noted ROS: Per HPI  Objective: BP 138/62   Pulse 64   Temp 97.4 F (36.3 C) (Oral)   Ht '5\' 4"'$  (1.626 m)   Wt 251 lb (113.9 kg)   BMI 43.08 kg/m  Gen: NAD, alert, cooperative with exam HEENT: NCAT CV: RRR, good S1/S2, no murmur Resp: CTABL, no wheezes, non-labored Ext: No edema, warm Neuro: Alert and oriented, No gross deficits  Wall ulcer Erythematous ring measuring 1.5 cm in diameter, central lesion with heme crusting measuring 5 mm.  Assessment and plan:  # Mood disorder Exacerbated, uncontrolled mixed anxiety and depression Continue Prozac, starting Abilify  # Hypertension Well-controlled on beta blocker Labs Refill Toprol  # Trigger finger New complaint, no triggering in clinic today Referring orthopedic surgery with carpal tunnel syndrome as well  # Tunnel syndrome Pins and needle type pain in the characteristic distribution of the right hand Mild improvement with cock-up splint Referring for further evaluation  Abdominal wall ulcer Improving, continue neopsporin    Orders Placed This Encounter  Procedures  . CMP14+EGFR  . CBC with Differential  . TSH  . LDL Cholesterol, Direct  . Ambulatory referral to  Orthopedic Surgery    Referral Priority:   Routine    Referral Type:   Surgical    Referral Reason:   Specialty Services Required    Requested Specialty:   Orthopedic Surgery    Number of Visits Requested:   1    Meds ordered this encounter  Medications  . ARIPiprazole (ABILIFY) 5 MG tablet    Sig: Take 1 tablet (5 mg total) by mouth daily.    Dispense:  30 tablet    Refill:  3  . FLUoxetine (PROZAC) 40 MG capsule    Sig: Take 1 capsule (40 mg total) by mouth daily.    Dispense:  90 capsule    Refill:  3  . metoprolol (LOPRESSOR) 50 MG tablet    Sig: Take 1 tablet (50 mg total) by mouth 2 (two) times daily.    Dispense:  180 tablet    Refill:  3  . cetirizine (ZYRTEC) 10 MG tablet    Sig: Take 1 tablet (10 mg total) by mouth at bedtime.    Dispense:  90 tablet    Refill:  Crystal [place], MD [facility] 07/25/2016, 3:27 PM

## 2016-07-25 NOTE — Patient Instructions (Addendum)
Great to see you!  Come back in 1 month to discuss anxiety and depression  Started you on Abilify, one half pill once daily for 2 weeks, then 1 pill once daily.  I have written a referral for orthopedic surgery for you to talk about your wrist pain.

## 2016-07-26 LAB — CBC WITH DIFFERENTIAL/PLATELET
Basophils Absolute: 0 10*3/uL (ref 0.0–0.2)
Basos: 0 %
EOS (ABSOLUTE): 0.2 10*3/uL (ref 0.0–0.4)
EOS: 3 %
Hematocrit: 38.7 % (ref 34.0–46.6)
Hemoglobin: 13.3 g/dL (ref 11.1–15.9)
IMMATURE GRANULOCYTES: 0 %
Immature Grans (Abs): 0 10*3/uL (ref 0.0–0.1)
LYMPHS: 44 %
Lymphocytes Absolute: 3.2 10*3/uL — ABNORMAL HIGH (ref 0.7–3.1)
MCH: 30.6 pg (ref 26.6–33.0)
MCHC: 34.4 g/dL (ref 31.5–35.7)
MCV: 89 fL (ref 79–97)
MONOS ABS: 0.5 10*3/uL (ref 0.1–0.9)
Monocytes: 8 %
NEUTROS PCT: 45 %
Neutrophils Absolute: 3.2 10*3/uL (ref 1.4–7.0)
PLATELETS: 229 10*3/uL (ref 150–379)
RBC: 4.35 x10E6/uL (ref 3.77–5.28)
RDW: 14.2 % (ref 12.3–15.4)
WBC: 7.1 10*3/uL (ref 3.4–10.8)

## 2016-07-26 LAB — CMP14+EGFR
ALK PHOS: 95 IU/L (ref 39–117)
ALT: 43 IU/L — AB (ref 0–32)
AST: 31 IU/L (ref 0–40)
Albumin/Globulin Ratio: 1.5 (ref 1.2–2.2)
Albumin: 4.4 g/dL (ref 3.6–4.8)
BUN/Creatinine Ratio: 29 — ABNORMAL HIGH (ref 12–28)
BUN: 20 mg/dL (ref 8–27)
Bilirubin Total: 0.3 mg/dL (ref 0.0–1.2)
CO2: 25 mmol/L (ref 18–29)
CREATININE: 0.68 mg/dL (ref 0.57–1.00)
Calcium: 9.6 mg/dL (ref 8.7–10.3)
Chloride: 103 mmol/L (ref 96–106)
GFR calc Af Amer: 108 mL/min/{1.73_m2} (ref >59)
GFR calc non Af Amer: 94 mL/min/{1.73_m2} (ref >59)
GLOBULIN, TOTAL: 3 g/dL (ref 1.5–4.5)
GLUCOSE: 110 mg/dL — AB (ref 65–99)
POTASSIUM: 4.7 mmol/L (ref 3.5–5.2)
SODIUM: 143 mmol/L (ref 134–144)
Total Protein: 7.4 g/dL (ref 6.0–8.5)

## 2016-07-26 LAB — TSH: TSH: 1.53 u[IU]/mL (ref 0.450–4.500)

## 2016-07-26 LAB — LDL CHOLESTEROL, DIRECT: LDL DIRECT: 144 mg/dL — AB (ref 0–99)

## 2016-08-26 ENCOUNTER — Encounter: Payer: Self-pay | Admitting: Family Medicine

## 2016-08-26 ENCOUNTER — Ambulatory Visit (INDEPENDENT_AMBULATORY_CARE_PROVIDER_SITE_OTHER): Payer: BLUE CROSS/BLUE SHIELD | Admitting: Family Medicine

## 2016-08-26 VITALS — BP 157/62 | HR 61 | Temp 97.0°F | Ht 64.0 in | Wt 257.6 lb

## 2016-08-26 DIAGNOSIS — F39 Unspecified mood [affective] disorder: Secondary | ICD-10-CM

## 2016-08-26 DIAGNOSIS — I10 Essential (primary) hypertension: Secondary | ICD-10-CM | POA: Diagnosis not present

## 2016-08-26 DIAGNOSIS — M5441 Lumbago with sciatica, right side: Secondary | ICD-10-CM

## 2016-08-26 DIAGNOSIS — E785 Hyperlipidemia, unspecified: Secondary | ICD-10-CM | POA: Diagnosis not present

## 2016-08-26 DIAGNOSIS — R7301 Impaired fasting glucose: Secondary | ICD-10-CM

## 2016-08-26 MED ORDER — ARIPIPRAZOLE 5 MG PO TABS: ORAL_TABLET | ORAL | 3 refills | Status: DC

## 2016-08-26 MED ORDER — PREDNISONE 10 MG PO TABS: ORAL_TABLET | ORAL | 0 refills | Status: DC

## 2016-08-26 NOTE — Patient Instructions (Signed)
Great to see you!  Come back in 1 month, I think if we stick with it we can get you good relief for some of your depression. Consider making a counseling appointment with your pastor

## 2016-08-26 NOTE — Progress Notes (Signed)
   HPI  Patient presents today here to follow-up for depression.  She states that she continues to take Abilify, however she does not see a difference. She has frequent thoughts of being better off dead but states that she wouldn't hurt herself. She contracts for safety. She states that most of her depression comes from being very busy, she "never gets a break". She has never had a vacation.  She takes her blood pressure medications daily. No chest pain, dyspnea, palpitations.  She does not think a colonoscopy, she will do of stool blood test. And she agrees to do colonoscopy if it's positive.  Versus 2 weeks of right-sided low back pain with sciatica The leg weakness, bowel or bladder dysfunction, or saddle anesthesia. No improvement with Aleve   PMH: Smoking status noted ROS: Per HPI  Objective: BP (!) 157/62   Pulse 61   Temp 97 F (36.1 C) (Oral)   Ht 5' 4" (1.626 m)   Wt 257 lb 9.6 oz (116.8 kg)   BMI 44.22 kg/m  Gen: NAD, alert, cooperative with exam HEENT: NCAT CV: RRR, good S1/S2, no murmur Resp: CTABL, no wheezes, non-labored Ext: No edema, warm Neuro: Alert and oriented, drink 5/5 and sensation intact in bilateral lower extremities  MSK Tenderness to palpation of right lower back over the paraspinal muscles and SI joint   Psych: Appropriate mood and affect, affect is actually not depressed or anxious at all, she is quite pleasant. Contracts for safety, denies suicidal thoughts but it does endorse feelings of being "better off that"  Depression screen [facility] 2/9 08/26/2016 07/25/2016 07/02/2016 06/17/2016 06/06/2016  Decreased Interest _0 0  Down, Depressed, Hopeless _1 0  PHQ - 2 Score _2 0  Altered sleeping _3 -  Tired, decreased energy _4 -  Change in appetite _5 -  Feeling bad or failure about yourself  _6 -  Trouble concentrating _7 -  Moving slowly or fidgety/restless 0 0 0 2 -  Suicidal thoughts 2 0 0 0 -  PHQ-9  Score _8 -  Difficult doing work/chores Very difficult Very difficult Somewhat difficult Somewhat difficult -     Assessment and plan:  # Mood disorder  No improvement with Abilify, tolerating well Titrated 10 mg Follow-up 3-4 weeks Consider changing Prozac to trintellix  # Hypertension Elevated today, continue following Continue beta blocker for now   # Hyperlipidemia Checking fasting labs today  # Right-sided low back pain with sciatica Prednisone course No red flags   Orders Placed This Encounter  Procedures  . CMP14+EGFR  . Lipid panel    Meds ordered this encounter  Medications  . ARIPiprazole (ABILIFY) 5 MG tablet    Sig: Take 1.5 tabs daily for 2 weeks then increase to 2 tabs daily    Dispense:  60 tablet    Refill:  3  . predniSONE (DELTASONE) 10 MG tablet    Sig: Take 4 pills a day for 3 days, then 3 pills a day for 3 days, then 2 pills a day for 3 days, then 1 pill a day for 3 days, then stop    Dispense:  30 tablet    Refill:  0    Laroy Apple, MD [facility] 08/26/2016, 8:55 AM

## 2016-08-27 LAB — LIPID PANEL
Chol/HDL Ratio: 3.1 ratio units (ref 0.0–4.4)
Cholesterol, Total: 190 mg/dL (ref 100–199)
HDL: 61 mg/dL (ref >39)
LDL Calculated: 100 mg/dL — ABNORMAL HIGH (ref 0–99)
TRIGLYCERIDES: 143 mg/dL (ref 0–149)
VLDL Cholesterol Cal: 29 mg/dL (ref 5–40)

## 2016-08-27 LAB — CMP14+EGFR
A/G RATIO: 1.3 (ref 1.2–2.2)
ALT: 52 IU/L — AB (ref 0–32)
AST: 29 IU/L (ref 0–40)
Albumin: 4.1 g/dL (ref 3.6–4.8)
Alkaline Phosphatase: 100 IU/L (ref 39–117)
BILIRUBIN TOTAL: 0.2 mg/dL (ref 0.0–1.2)
BUN/Creatinine Ratio: 23 (ref 12–28)
BUN: 14 mg/dL (ref 8–27)
CHLORIDE: 106 mmol/L (ref 96–106)
CO2: 25 mmol/L (ref 18–29)
Calcium: 9.2 mg/dL (ref 8.7–10.3)
Creatinine, Ser: 0.62 mg/dL (ref 0.57–1.00)
GFR calc Af Amer: 112 mL/min/{1.73_m2} (ref >59)
GFR calc non Af Amer: 97 mL/min/{1.73_m2} (ref >59)
Globulin, Total: 3.1 g/dL (ref 1.5–4.5)
Glucose: 116 mg/dL — ABNORMAL HIGH (ref 65–99)
POTASSIUM: 4.8 mmol/L (ref 3.5–5.2)
SODIUM: 147 mmol/L — AB (ref 134–144)
Total Protein: 7.2 g/dL (ref 6.0–8.5)

## 2016-08-28 ENCOUNTER — Other Ambulatory Visit: Payer: Self-pay

## 2016-08-28 DIAGNOSIS — R7301 Impaired fasting glucose: Secondary | ICD-10-CM

## 2016-08-28 LAB — BAYER DCA HB A1C WAIVED: HB A1C (BAYER DCA - WAIVED): 5.7 % (ref <7.0)

## 2016-08-28 NOTE — Addendum Note (Signed)
Addended by: Liliane Bade on: 08/28/2016 12:10 PM   Modules accepted: Orders

## 2016-09-05 ENCOUNTER — Other Ambulatory Visit: Payer: BLUE CROSS/BLUE SHIELD

## 2016-09-05 DIAGNOSIS — Z1212 Encounter for screening for malignant neoplasm of rectum: Secondary | ICD-10-CM

## 2016-09-07 LAB — FECAL OCCULT BLOOD, IMMUNOCHEMICAL: FECAL OCCULT BLD: NEGATIVE

## 2016-09-12 ENCOUNTER — Other Ambulatory Visit: Payer: Self-pay | Admitting: *Deleted

## 2016-09-12 MED ORDER — ZOLPIDEM TARTRATE 5 MG PO TABS: 5.0000 mg | ORAL_TABLET | Freq: Every evening | ORAL | 2 refills | Status: DC | PRN

## 2016-09-12 NOTE — Telephone Encounter (Signed)
Refill called in to [facility] for Ambien 5 mg #30, with 2 refills, patient notified

## 2016-09-12 NOTE — Telephone Encounter (Signed)
Last seen bradshaw - 08/26/16

## 2016-11-27 ENCOUNTER — Encounter: Payer: Self-pay | Admitting: Family Medicine

## 2016-11-27 ENCOUNTER — Ambulatory Visit (INDEPENDENT_AMBULATORY_CARE_PROVIDER_SITE_OTHER): Payer: BLUE CROSS/BLUE SHIELD | Admitting: Family Medicine

## 2016-11-27 VITALS — BP 150/68 | HR 76 | Temp 96.2°F | Ht 64.0 in | Wt 264.2 lb

## 2016-11-27 DIAGNOSIS — F39 Unspecified mood [affective] disorder: Secondary | ICD-10-CM

## 2016-11-27 DIAGNOSIS — R252 Cramp and spasm: Secondary | ICD-10-CM | POA: Diagnosis not present

## 2016-11-27 DIAGNOSIS — I10 Essential (primary) hypertension: Secondary | ICD-10-CM

## 2016-11-27 MED ORDER — GABAPENTIN 300 MG PO CAPS: 300.0000 mg | ORAL_CAPSULE | Freq: Every day | ORAL | 0 refills | Status: DC

## 2016-11-27 MED ORDER — AMLODIPINE BESYLATE 10 MG PO TABS: 10.0000 mg | ORAL_TABLET | Freq: Every day | ORAL | 3 refills | Status: DC

## 2016-11-27 NOTE — Patient Instructions (Signed)
Great to see you!  I have sent gabapentin for your leg cramps, take 1 capsule for 3 nights, if you are not seeing relief, try 2.   In 1 week start amlodipine, 1 pill once daily at night.   Come back in 3 months unless you need [place] sooner.

## 2016-11-27 NOTE — Progress Notes (Signed)
   HPI  Patient presents today here for chronic medical condition follow-up as well as milligrams.  Mood disorders doing much better, denies any suicidal thoughts or feelings of being better off dead. Good medication compliance with no side effects.  Hypertension routinely Q000111Q and 0000000 systolic, heart rate usually in the Q000111Q, diastolic usually the Q000111Q and 80s No headache, chest pain, palpitations, leg swelling.  Leg cramps Patient reports 3-4 weeks of bilateral leg pain and foot pain and leg cramps mostly at night. Denies any problems during the daytime. She has good relief with rinsing her legs with warm water and then placing socks on going back to bed.  She denies any serious back pain, she does have some mild right-sided low back pain in the paraspinal muscles.  PMH: Smoking status noted ROS: Per HPI  Objective: BP (!) 150/68   Pulse 76   Temp (!) 96.2 F (35.7 C) (Oral)   Ht 5\' 4"  (1.626 m)   Wt 264 lb 3.2 oz (119.8 kg)   BMI 45.35 kg/m  Gen: NAD, alert, cooperative with exam HEENT: NCAT CV: RRR, good S1/S2, no murmur Resp: CTABL, no wheezes, non-labored Ext: No edema, warm Neuro: Alert and oriented, No gross deficits  Msk:  Tenderness to palpation of paraspinal muscles and lumbar spine or midline spine Normal-appearing feet with 2+ dorsalis pedis pulses, sensation intact to monofilament throughout   Assessment and plan:  # Hypertension Uncontrolled Continue metoprolol, having amlodipine Follow-up 3 months  Amlodipine one week after start gabapentin  # Mood disorder Improved, denies suicidal thoughts Continue Abilify plus Prozac  # Leg cramps No clear etiology, new problem Good circulation in feet Trial of gabapentin, may titrate to 600 mg once at night in 3 days if not improved   Laroy Apple, MD [facility] 11/27/2016, 8:31 AM

## 2016-12-11 ENCOUNTER — Other Ambulatory Visit: Payer: Self-pay | Admitting: Family Medicine

## 2016-12-12 ENCOUNTER — Telehealth: Payer: Self-pay | Admitting: *Deleted

## 2016-12-12 NOTE — Telephone Encounter (Signed)
Ambien [facility] to [facility]

## 2016-12-19 ENCOUNTER — Ambulatory Visit (INDEPENDENT_AMBULATORY_CARE_PROVIDER_SITE_OTHER): Payer: BLUE CROSS/BLUE SHIELD | Admitting: Family Medicine

## 2016-12-19 ENCOUNTER — Encounter: Payer: Self-pay | Admitting: Family Medicine

## 2016-12-19 VITALS — BP 137/62 | HR 84 | Temp 98.3°F | Ht 64.0 in | Wt 270.0 lb

## 2016-12-19 DIAGNOSIS — J209 Acute bronchitis, unspecified: Secondary | ICD-10-CM | POA: Diagnosis not present

## 2016-12-19 LAB — VERITOR FLU A/B WAIVED
Influenza A: NEGATIVE
Influenza B: NEGATIVE

## 2016-12-19 MED ORDER — AZITHROMYCIN 250 MG PO TABS: ORAL_TABLET | ORAL | 0 refills | Status: DC

## 2016-12-19 NOTE — Progress Notes (Signed)
BP 137/62   Pulse 84   Temp 98.3 F (36.8 C) (Oral)   Ht 5\' 4"  (1.626 m)   Wt 270 lb (122.5 kg)   BMI 46.35 kg/m    Subjective:    Patient ID: Diana Mathews, female    DOB: 17-Jan-1951, 66 y.o.   MRN: [id number]  HPI: Diana Mathews is a 66 y.o. female presenting on 12/19/2016 for Generalized Body Aches; Fever; and Cough   HPI Cough and fever and body aches Patient has been having cough and fever and body aches is going on for the past 2 days. 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 She developed these symptoms starting 2 days ago and had a fever of 99 yesterday. She denies any fevers higher than that. She has been using Aleve which is helped with the achiness and the fever but not really using anything else to this point. She denies any shortness of breath but thinks she may have had some wheezing intermittently especially at night. The cough is especially worse at night as well.  Relevant past medical, surgical, family and social history reviewed and updated as indicated. Interim medical history since our last visit reviewed. Allergies and medications reviewed and updated.  Review of Systems  Constitutional: Positive for chills and fever.  HENT: Positive for congestion, postnasal drip, rhinorrhea, sinus pressure and sore throat. Negative for ear discharge, ear pain and sneezing.   Eyes: Negative for pain, redness and visual disturbance.  Respiratory: Positive for cough. Negative for chest tightness and shortness of breath.   Cardiovascular: Negative for chest pain and leg swelling.  Genitourinary: Negative for difficulty urinating and dysuria.  Musculoskeletal: Positive for myalgias. Negative for back pain and gait problem.  Skin: Negative for rash.  Neurological: Negative for light-headedness and headaches.  Psychiatric/Behavioral: Negative for agitation and behavioral problems.  All other systems  reviewed and are negative.   Per HPI unless specifically indicated above   Allergies as of 12/19/2016      Reactions   Codeine Rash      Medication List       Accurate as of 12/19/16  8:58 AM. Always use your most recent med list.          amLODipine 10 MG tablet Commonly known as:  NORVASC Take 1 tablet (10 mg total) by mouth daily.   ARIPiprazole 5 MG tablet Commonly known as:  ABILIFY Take 1.5 tabs daily for 2 weeks then increase to 2 tabs daily   aspirin 81 MG tablet Take 81 mg by mouth daily.   azithromycin 250 MG tablet Commonly known as:  ZITHROMAX Take 2 the first day and then one each day after.   cetirizine 10 MG tablet Commonly known as:  ZYRTEC Take 1 tablet (10 mg total) by mouth at bedtime.   FLUoxetine 40 MG capsule Commonly known as:  PROZAC Take 1 capsule (40 mg total) by mouth daily.   gabapentin 300 MG capsule Commonly known as:  NEURONTIN Take 1-2 capsules (300-600 mg total) by mouth at bedtime.   metoprolol 50 MG tablet Commonly known as:  LOPRESSOR Take 1 tablet (50 mg total) by mouth 2 (two) times daily.   vitamin B-12 1000 MCG tablet Commonly known as:  CYANOCOBALAMIN Take 1,000 mcg by mouth daily.   zolpidem 5 MG tablet Commonly known as:  AMBIEN TAKE ONE (1) TABLET EACH DAY  Objective:    BP 137/62   Pulse 84   Temp 98.3 F (36.8 C) (Oral)   Ht 5\' 4"  (1.626 m)   Wt 270 lb (122.5 kg)   BMI 46.35 kg/m   Wt Readings from Last 3 Encounters:  12/19/16 270 lb (122.5 kg)  11/27/16 264 lb 3.2 oz (119.8 kg)  08/26/16 257 lb 9.6 oz (116.8 kg)    Physical Exam  Constitutional: She is oriented to person, place, and time. She appears well-developed and well-nourished. No distress.  HENT:  Right Ear: Tympanic membrane, external ear and ear canal normal.  Left Ear: Tympanic membrane, external ear and ear canal normal.  Nose: Mucosal edema and rhinorrhea present. No epistaxis. Right sinus exhibits no maxillary sinus  tenderness and no frontal sinus tenderness. Left sinus exhibits no maxillary sinus tenderness and no frontal sinus tenderness.  Mouth/Throat: Uvula is midline and mucous membranes are normal. Posterior oropharyngeal edema and posterior oropharyngeal erythema present. No oropharyngeal exudate or tonsillar abscesses.  Eyes: Conjunctivae are normal.  Cardiovascular: Normal rate, regular rhythm, normal heart sounds and intact distal pulses.   No murmur heard. Pulmonary/Chest: Effort normal and breath sounds normal. No respiratory distress. She has no wheezes. She has no rales.  Musculoskeletal: Normal range of motion. She exhibits no edema or tenderness.  Neurological: She is alert and oriented to person, place, and time. Coordination normal.  Skin: Skin is warm and dry. No rash noted. She is not diaphoretic.  Psychiatric: She has a normal mood and affect. Her behavior is normal.  Vitals reviewed.   Rapid flu: Negative    Assessment & Plan:   Problem List Items Addressed This Visit    None    Visit Diagnoses    Acute bronchitis, unspecified organism    -  Primary   Relevant Medications   azithromycin (ZITHROMAX) 250 MG tablet   Other Relevant Orders   Veritor Flu A/B Waived       Follow up plan: Return if symptoms worsen or fail to improve.  Counseling provided for all of the vaccine components Orders Placed This Encounter  Procedures  . Veritor Flu A/B [place], MD [facility] 12/19/2016, 8:58 AM

## 2016-12-30 ENCOUNTER — Other Ambulatory Visit: Payer: Self-pay | Admitting: Family Medicine

## 2017-01-30 ENCOUNTER — Other Ambulatory Visit: Payer: Self-pay | Admitting: Family Medicine

## 2017-02-17 ENCOUNTER — Telehealth: Payer: Self-pay

## 2017-02-17 MED ORDER — ARIPIPRAZOLE 10 MG PO TABS: 10.0000 mg | ORAL_TABLET | Freq: Every day | ORAL | 3 refills | Status: DC

## 2017-02-17 NOTE — Telephone Encounter (Signed)
Order changed.   Laroy Apple, MD [facility] 02/17/2017, 12:27 PM

## 2017-02-26 ENCOUNTER — Ambulatory Visit: Payer: BLUE CROSS/BLUE SHIELD | Admitting: Family Medicine

## 2017-03-18 ENCOUNTER — Other Ambulatory Visit: Payer: Self-pay | Admitting: Family Medicine

## 2017-04-09 ENCOUNTER — Encounter: Payer: Self-pay | Admitting: Family Medicine

## 2017-04-09 ENCOUNTER — Ambulatory Visit (INDEPENDENT_AMBULATORY_CARE_PROVIDER_SITE_OTHER): Payer: BLUE CROSS/BLUE SHIELD | Admitting: Family Medicine

## 2017-04-09 VITALS — BP 152/71 | HR 63 | Temp 97.6°F | Ht 64.0 in | Wt 264.8 lb

## 2017-04-09 DIAGNOSIS — I10 Essential (primary) hypertension: Secondary | ICD-10-CM | POA: Diagnosis not present

## 2017-04-09 DIAGNOSIS — G4700 Insomnia, unspecified: Secondary | ICD-10-CM

## 2017-04-09 DIAGNOSIS — B349 Viral infection, unspecified: Secondary | ICD-10-CM

## 2017-04-09 MED ORDER — METOPROLOL TARTRATE 50 MG PO TABS: 50.0000 mg | ORAL_TABLET | Freq: Two times a day (BID) | ORAL | 3 refills | Status: DC

## 2017-04-09 MED ORDER — ZOLPIDEM TARTRATE 5 MG PO TABS: ORAL_TABLET | ORAL | 2 refills | Status: DC

## 2017-04-09 NOTE — Patient Instructions (Signed)
Great to see you!  Please call if you are having worsening symptoms. At this point I think you should gradually improve over the next 2 weeks.

## 2017-04-09 NOTE — Progress Notes (Signed)
   HPI  Patient presents today here for follow-up of chronic medical conditions also with headache and balance difficulty.  Patient explains she's had right-sided headache, off and on for about 2 weeks. He states that it's improved today, she also has had some unsteadiness of gait off and on for the same time. She reports 2-3 days of feeling ill with chills and fatigue. Patient states that she stayed in bed most the time during that time.  No cough, shortness of breath, chest pain.  She did have shortness of breath and racing heart with activity during this time. These are all improving at this time.  Sleep, doing well with Ambien, still has difficulty sleeping at night without Ambien.  Hypertension Good medication compliance, needs refill of metoprolol.  PMH: Smoking status noted ROS: Per HPI  Objective: BP (!) 152/71   Pulse 63   Temp 97.6 F (36.4 C) (Oral)   Ht 5\' 4"  (1.626 m)   Wt 264 lb 12.8 oz (120.1 kg)   BMI 45.45 kg/m  Gen: NAD, alert, cooperative with exam HEENT: NCAT, no tenderness to palpation over frontal or maxillary sinus, erythema in the nares, oropharynx clear, + tenderness to palpation over the right mastoid area as well as superior to the right ear. CV: RRR, good S1/S2, no murmur Resp: CTABL, no wheezes, non-labored Abd: SNTND, BS present, no guarding or organomegaly Ext: No edema, warm Neuro: Alert and oriented, No gross deficits  Assessment and plan:  # Viral illness Symptoms most consistent with viral illness, and concerned with mild mastoid tenderness. However her symptoms are improving I do not believe workup for mastoiditis is warranted today. I have recommended the patient keep a very low threshold for calling back, if her right sided head pain returns or if her symptoms like chills or fatigue return I would like to pursue CT scan of the head to rule out mastoiditis.   # Hypertension Doing well, slightly elevated today. Refilled metoprolol No  changes.  # Insomnia Doing well with Ambien, refilled Discussed refill only at [facility] ordered this encounter  Medications  . metoprolol tartrate (LOPRESSOR) 50 MG tablet    Sig: Take 1 tablet (50 mg total) by mouth 2 (two) times daily.    Dispense:  180 tablet    Refill:  3  . zolpidem (AMBIEN) 5 MG tablet    Sig: TAKE ONE (1) TABLET EACH DAY    Dispense:  30 tablet    Refill:  2    Laroy Apple, MD [facility] 04/09/2017, 8:53 AM

## 2017-05-19 ENCOUNTER — Other Ambulatory Visit: Payer: Self-pay | Admitting: Family Medicine

## 2017-07-02 ENCOUNTER — Other Ambulatory Visit: Payer: Self-pay | Admitting: Family Medicine

## 2017-07-03 NOTE — Telephone Encounter (Signed)
Last seen 04/09/17  Dr Wendi Snipes  If approved route to nurse to call into [facility]

## 2017-07-03 NOTE — Telephone Encounter (Signed)
Rx phoned in. Attempted to contact patient and busy x2. Drug store will let patient know she needs to be seen for any more refills.

## 2018-05-20 ENCOUNTER — Ambulatory Visit (INDEPENDENT_AMBULATORY_CARE_PROVIDER_SITE_OTHER): Payer: PRIVATE HEALTH INSURANCE | Admitting: Family Medicine

## 2018-05-20 ENCOUNTER — Ambulatory Visit (INDEPENDENT_AMBULATORY_CARE_PROVIDER_SITE_OTHER): Payer: PRIVATE HEALTH INSURANCE

## 2018-05-20 ENCOUNTER — Encounter: Payer: Self-pay | Admitting: Family Medicine

## 2018-05-20 VITALS — BP 134/76 | HR 82 | Temp 98.0°F | Ht 64.0 in | Wt 255.0 lb

## 2018-05-20 DIAGNOSIS — F339 Major depressive disorder, recurrent, unspecified: Secondary | ICD-10-CM | POA: Diagnosis not present

## 2018-05-20 DIAGNOSIS — M545 Low back pain, unspecified: Secondary | ICD-10-CM

## 2018-05-20 DIAGNOSIS — F39 Unspecified mood [affective] disorder: Secondary | ICD-10-CM

## 2018-05-20 DIAGNOSIS — L989 Disorder of the skin and subcutaneous tissue, unspecified: Secondary | ICD-10-CM | POA: Diagnosis not present

## 2018-05-20 MED ORDER — CYCLOBENZAPRINE HCL 10 MG PO TABS: 10.0000 mg | ORAL_TABLET | Freq: Every day | ORAL | 0 refills | Status: DC

## 2018-05-20 MED ORDER — NAPROXEN 500 MG PO TABS: 500.0000 mg | ORAL_TABLET | Freq: Two times a day (BID) | ORAL | 0 refills | Status: DC

## 2018-05-20 MED ORDER — MUPIROCIN 2 % EX OINT: 1.0000 "application " | TOPICAL_OINTMENT | Freq: Two times a day (BID) | CUTANEOUS | 0 refills | Status: DC

## 2018-05-20 MED ORDER — MIRTAZAPINE 15 MG PO TABS: 15.0000 mg | ORAL_TABLET | Freq: Every day | ORAL | 3 refills | Status: DC
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
## 2018-06-02 ENCOUNTER — Other Ambulatory Visit: Payer: Self-pay | Admitting: Family Medicine

## 2018-06-12 ENCOUNTER — Emergency Department (HOSPITAL_COMMUNITY): Payer: PRIVATE HEALTH INSURANCE

## 2018-06-12 ENCOUNTER — Encounter (HOSPITAL_COMMUNITY): Payer: Self-pay | Admitting: Emergency Medicine

## 2018-06-12 ENCOUNTER — Ambulatory Visit (INDEPENDENT_AMBULATORY_CARE_PROVIDER_SITE_OTHER): Payer: PRIVATE HEALTH INSURANCE | Admitting: Family Medicine

## 2018-06-12 ENCOUNTER — Other Ambulatory Visit: Payer: Self-pay

## 2018-06-12 ENCOUNTER — Observation Stay (HOSPITAL_COMMUNITY): Payer: PRIVATE HEALTH INSURANCE

## 2018-06-12 ENCOUNTER — Encounter: Payer: Self-pay | Admitting: Family Medicine

## 2018-06-12 ENCOUNTER — Inpatient Hospital Stay (HOSPITAL_COMMUNITY)
Admission: EM | Admit: 2018-06-12 | Discharge: 2018-06-14 | DRG: 069 | Disposition: A | Discharge status: Home / Self Care | Payer: PRIVATE HEALTH INSURANCE | Attending: Family Medicine | Admitting: Family Medicine

## 2018-06-12 VITALS — BP 161/86 | HR 136 | Temp 98.2°F | Ht 64.0 in | Wt 254.5 lb

## 2018-06-12 DIAGNOSIS — Z713 Dietary counseling and surveillance: Secondary | ICD-10-CM

## 2018-06-12 DIAGNOSIS — G459 Transient cerebral ischemic attack, unspecified: Secondary | ICD-10-CM | POA: Diagnosis not present

## 2018-06-12 DIAGNOSIS — I10 Essential (primary) hypertension: Secondary | ICD-10-CM | POA: Diagnosis not present

## 2018-06-12 DIAGNOSIS — I160 Hypertensive urgency: Secondary | ICD-10-CM

## 2018-06-12 DIAGNOSIS — R471 Dysarthria and anarthria: Secondary | ICD-10-CM | POA: Diagnosis not present

## 2018-06-12 DIAGNOSIS — E876 Hypokalemia: Secondary | ICD-10-CM | POA: Diagnosis present

## 2018-06-12 DIAGNOSIS — Z8673 Personal history of transient ischemic attack (TIA), and cerebral infarction without residual deficits: Secondary | ICD-10-CM

## 2018-06-12 DIAGNOSIS — Z833 Family history of diabetes mellitus: Secondary | ICD-10-CM | POA: Diagnosis not present

## 2018-06-12 DIAGNOSIS — R002 Palpitations: Secondary | ICD-10-CM | POA: Diagnosis present

## 2018-06-12 DIAGNOSIS — I4891 Unspecified atrial fibrillation: Secondary | ICD-10-CM | POA: Diagnosis present

## 2018-06-12 DIAGNOSIS — R29702 NIHSS score 2: Secondary | ICD-10-CM | POA: Diagnosis not present

## 2018-06-12 DIAGNOSIS — G4733 Obstructive sleep apnea (adult) (pediatric): Secondary | ICD-10-CM | POA: Diagnosis present

## 2018-06-12 DIAGNOSIS — Z7982 Long term (current) use of aspirin: Secondary | ICD-10-CM

## 2018-06-12 DIAGNOSIS — Z9049 Acquired absence of other specified parts of digestive tract: Secondary | ICD-10-CM | POA: Diagnosis not present

## 2018-06-12 DIAGNOSIS — E1165 Type 2 diabetes mellitus with hyperglycemia: Secondary | ICD-10-CM | POA: Diagnosis not present

## 2018-06-12 DIAGNOSIS — E785 Hyperlipidemia, unspecified: Secondary | ICD-10-CM | POA: Diagnosis present

## 2018-06-12 DIAGNOSIS — Z79899 Other long term (current) drug therapy: Secondary | ICD-10-CM

## 2018-06-12 DIAGNOSIS — F329 Major depressive disorder, single episode, unspecified: Secondary | ICD-10-CM | POA: Diagnosis present

## 2018-06-12 DIAGNOSIS — Z91120 Patient's intentional underdosing of medication regimen due to financial hardship: Secondary | ICD-10-CM | POA: Diagnosis not present

## 2018-06-12 DIAGNOSIS — R4702 Dysphasia: Secondary | ICD-10-CM | POA: Diagnosis present

## 2018-06-12 DIAGNOSIS — R4781 Slurred speech: Secondary | ICD-10-CM | POA: Insufficient documentation

## 2018-06-12 DIAGNOSIS — T465X6A Underdosing of other antihypertensive drugs, initial encounter: Secondary | ICD-10-CM | POA: Diagnosis present

## 2018-06-12 DIAGNOSIS — E66813 Obesity, class 3: Secondary | ICD-10-CM

## 2018-06-12 DIAGNOSIS — Z885 Allergy status to narcotic agent status: Secondary | ICD-10-CM | POA: Diagnosis not present

## 2018-06-12 DIAGNOSIS — Z90710 Acquired absence of both cervix and uterus: Secondary | ICD-10-CM | POA: Diagnosis not present

## 2018-06-12 DIAGNOSIS — Z6839 Body mass index (BMI) 39.0-39.9, adult: Secondary | ICD-10-CM | POA: Insufficient documentation

## 2018-06-12 DIAGNOSIS — R739 Hyperglycemia, unspecified: Secondary | ICD-10-CM

## 2018-06-12 DIAGNOSIS — R000 Tachycardia, unspecified: Secondary | ICD-10-CM | POA: Diagnosis not present

## 2018-06-12 DIAGNOSIS — Z79890 Hormone replacement therapy (postmenopausal): Secondary | ICD-10-CM

## 2018-06-12 DIAGNOSIS — Z825 Family history of asthma and other chronic lower respiratory diseases: Secondary | ICD-10-CM | POA: Diagnosis not present

## 2018-06-12 DIAGNOSIS — Z6841 Body Mass Index (BMI) 40.0 and over, adult: Secondary | ICD-10-CM

## 2018-06-12 DIAGNOSIS — I639 Cerebral infarction, unspecified: Secondary | ICD-10-CM

## 2018-06-12 HISTORY — DX: Headache, unspecified: R51.9

## 2018-06-12 HISTORY — DX: Unspecified atrial fibrillation: I48.91

## 2018-06-12 HISTORY — DX: Headache: R51

## 2018-06-12 LAB — URINALYSIS, ROUTINE W REFLEX MICROSCOPIC
BILIRUBIN URINE: NEGATIVE
GLUCOSE, UA: NEGATIVE mg/dL
Hgb urine dipstick: NEGATIVE
KETONES UR: NEGATIVE mg/dL
Leukocytes, UA: NEGATIVE
Nitrite: NEGATIVE
PH: 5 (ref 5.0–8.0)
PROTEIN: NEGATIVE mg/dL
Specific Gravity, Urine: 1.025 (ref 1.005–1.030)

## 2018-06-12 LAB — CBC WITH DIFFERENTIAL/PLATELET
BASOS ABS: 0 10*3/uL (ref 0.0–0.1)
Basophils Relative: 1 %
EOS ABS: 0.1 10*3/uL (ref 0.0–0.7)
EOS PCT: 2 %
HCT: 41 % (ref 36.0–46.0)
Hemoglobin: 14.1 g/dL (ref 12.0–15.0)
LYMPHS ABS: 2.6 10*3/uL (ref 0.7–4.0)
Lymphocytes Relative: 42 %
MCH: 32 pg (ref 26.0–34.0)
MCHC: 34.4 g/dL (ref 30.0–36.0)
MCV: 93.2 fL (ref 78.0–100.0)
MONO ABS: 0.5 10*3/uL (ref 0.1–1.0)
Monocytes Relative: 8 %
Neutro Abs: 3.1 10*3/uL (ref 1.7–7.7)
Neutrophils Relative %: 49 %
PLATELETS: 269 10*3/uL (ref 150–400)
RBC: 4.4 MIL/uL (ref 3.87–5.11)
RDW: 12.4 % (ref 11.5–15.5)
WBC: 6.3 10*3/uL (ref 4.0–10.5)

## 2018-06-12 LAB — BASIC METABOLIC PANEL
Anion gap: 13 (ref 5–15)
BUN: 22 mg/dL (ref 8–23)
CO2: 22 mmol/L (ref 22–32)
CREATININE: 0.86 mg/dL (ref 0.44–1.00)
Calcium: 9.7 mg/dL (ref 8.9–10.3)
Chloride: 103 mmol/L (ref 98–111)
GFR calc Af Amer: >60 mL/min (ref >60)
GLUCOSE: 192 mg/dL — AB (ref 70–99)
Potassium: 3.4 mmol/L — ABNORMAL LOW (ref 3.5–5.1)
SODIUM: 138 mmol/L (ref 135–145)

## 2018-06-12 LAB — TROPONIN I

## 2018-06-12 LAB — MRSA PCR SCREENING: MRSA by PCR: NEGATIVE

## 2018-06-12 LAB — GLUCOSE, CAPILLARY
Glucose-Capillary: 115 mg/dL — ABNORMAL HIGH (ref 70–99)
Glucose-Capillary: 122 mg/dL — ABNORMAL HIGH (ref 70–99)

## 2018-06-12 MED ORDER — SENNOSIDES-DOCUSATE SODIUM 8.6-50 MG PO TABS: 1.0000 | ORAL_TABLET | Freq: Every evening | ORAL | Status: DC | PRN

## 2018-06-12 MED ORDER — INSULIN ASPART 100 UNIT/ML ~~LOC~~ SOLN
0.0000 [IU] | Freq: Three times a day (TID) | SUBCUTANEOUS | Status: DC
  Administered 2018-06-13 – 2018-06-14 (×4): 1 [IU] via SUBCUTANEOUS

## 2018-06-12 MED ORDER — ASPIRIN 325 MG PO TABS
325.0000 mg | ORAL_TABLET | Freq: Every day | ORAL | Status: DC
  Administered 2018-06-13 – 2018-06-14 (×2): 325 mg via ORAL
  Filled 2018-06-12 (×2): qty 1

## 2018-06-12 MED ORDER — IOPAMIDOL (ISOVUE-370) INJECTION 76%
50.0000 mL | Freq: Once | INTRAVENOUS | Status: AC | PRN
  Administered 2018-06-12: 50 mL via INTRAVENOUS

## 2018-06-12 MED ORDER — ASPIRIN 81 MG PO CHEW
324.0000 mg | CHEWABLE_TABLET | Freq: Once | ORAL | Status: AC
  Administered 2018-06-12: 324 mg via ORAL
  Filled 2018-06-12: qty 4

## 2018-06-12 MED ORDER — MELATONIN 3 MG PO TABS
3.0000 mg | ORAL_TABLET | Freq: Every day | ORAL | Status: DC
Start: 2018-06-12 — End: 2018-06-14
  Administered 2018-06-12 – 2018-06-13 (×2): 3 mg via ORAL
  Filled 2018-06-12 (×2): qty 1

## 2018-06-12 MED ORDER — ACETAMINOPHEN 160 MG/5ML PO SOLN: 650.0000 mg | ORAL | Status: DC | PRN

## 2018-06-12 MED ORDER — ENOXAPARIN SODIUM 40 MG/0.4ML ~~LOC~~ SOLN
40.0000 mg | SUBCUTANEOUS | Status: DC
  Administered 2018-06-12 – 2018-06-13 (×2): 40 mg via SUBCUTANEOUS
  Filled 2018-06-12 (×2): qty 0.4

## 2018-06-12 MED ORDER — ACETAMINOPHEN 325 MG PO TABS
650.0000 mg | ORAL_TABLET | ORAL | Status: DC | PRN
  Administered 2018-06-12 – 2018-06-13 (×2): 650 mg via ORAL
  Filled 2018-06-12 (×2): qty 2

## 2018-06-12 MED ORDER — ACETAMINOPHEN 325 MG PO TABS
650.0000 mg | ORAL_TABLET | Freq: Once | ORAL | Status: AC
  Administered 2018-06-12: 650 mg via ORAL
  Filled 2018-06-12: qty 2

## 2018-06-12 MED ORDER — HYDRALAZINE HCL 20 MG/ML IJ SOLN: 10.0000 mg | Freq: Four times a day (QID) | INTRAMUSCULAR | Status: DC | PRN

## 2018-06-12 MED ORDER — MIRTAZAPINE 15 MG PO TABS
15.0000 mg | ORAL_TABLET | Freq: Every day | ORAL | Status: DC
  Administered 2018-06-12 – 2018-06-13 (×2): 15 mg via ORAL
  Filled 2018-06-12 (×2): qty 1

## 2018-06-12 MED ORDER — ACETAMINOPHEN 650 MG RE SUPP: 650.0000 mg | RECTAL | Status: DC | PRN

## 2018-06-12 MED ORDER — SODIUM CHLORIDE 0.9 % IV SOLN
INTRAVENOUS | Status: DC
  Administered 2018-06-13: 01:00:00 via INTRAVENOUS

## 2018-06-12 MED ORDER — STROKE: EARLY STAGES OF RECOVERY BOOK
Freq: Once | Status: AC
  Administered 2018-06-12: 18:00:00
  Filled 2018-06-12: qty 1

## 2018-06-12 MED ORDER — IOPAMIDOL (ISOVUE-370) INJECTION 76%
INTRAVENOUS | Status: AC
  Filled 2018-06-12: qty 50

## 2018-06-12 MED ORDER — ASPIRIN 300 MG RE SUPP: 300.0000 mg | Freq: Every day | RECTAL | Status: DC

## 2018-06-12 NOTE — Consult Note (Addendum)
NEUROLOGY CONSULT  Reason for Consult: stroke Referring Physician: Forestine Na  CC: "my doctor thinks I'm having TIAs"  HPI: Diana Mathews is an 67 y.o. female who was admitted to [facility] for new onset Afib discovered at her PCP office by 12-lead EKG.  She reports that she has been quite distressed over the last week due to the death of her neighbor. On Feb 21, 2023, she reports that she had a racing HR. Her son at bedside says that she has been having waxing/waning speech changes over last few days.  Speech was described as being slurred.  It has been improving over the last 2 days but is still not back to baseline. CTH showed a left thalamic infarct likely remote (she reports right arm numbness for "a long time"). MRI ordered to assess for acute changes that would relate to her new speech changes. She currently has some mild dysarthria on exam, NIHSS 2 (scored 1 for the chronic numbness in right arm + 1 for dysarthria).  Patient reports history of hypertension but not taking medications because of lack of insurance. Neurology was consulted for stroke work up. No acute stroke treatments offered due to several days outside window and low NIH stroke scale.  Last seen normal-7 days ago NIH stroke scale-2 TPA given: Known-outside the window.  Stroke Risks: obesity, HTN, HLD, OSA, prev stroke   Past Medical History Past Medical History:  Diagnosis Date  . Arthritis    osteoarthritis  . Depression    pt taking prozac  . Headache   . HLD (hyperlipidemia) 08/26/2016  . Hypertension   . Sleep apnea    pt is supposed to be on CPAP    Past Surgical History Past Surgical History:  Procedure Laterality Date  . ABDOMINAL HYSTERECTOMY    . CHOLECYSTECTOMY    . HERNIA REPAIR    . WRIST SURGERY Bilateral    Carpal Tunnel    Family History Family History  Problem Relation Age of Onset  . Arthritis Mother   . COPD Mother   . Diabetes Mother   . Hypertension Mother   .  Arthritis Sister   . Diabetes Brother   . Arthritis Sister   . Diabetes Sister   . Diabetes Sister   . Diabetes Brother   . Mental illness Brother     Social History    reports that she has never smoked. She has never used smokeless tobacco. She reports that she does not drink alcohol or use drugs.  Allergies Allergies  Allergen Reactions  . Codeine Rash    Home Medications Medications Prior to Admission  Medication Sig Dispense Refill  . acetaminophen (TYLENOL) 500 MG tablet Take 1,000 mg by mouth every 6 (six) hours as needed.    Marland Kitchen aspirin 81 MG tablet Take 81 mg by mouth daily.    . cyclobenzaprine (FLEXERIL) 10 MG tablet Take 1 tablet (10 mg total) by mouth at bedtime. 20 tablet 0  . diphenhydrAMINE (BENADRYL) 25 mg capsule Take 25 mg by mouth at bedtime as needed.    . Melatonin 10 MG CAPS Take 20 mg by mouth at bedtime.    . mirtazapine (REMERON) 15 MG tablet Take 1 tablet (15 mg total) by mouth at bedtime. 30 tablet 3  . mupirocin ointment (BACTROBAN) 2 % Apply 1 application topically 2 (two) times daily. 22 g 0  . naproxen (NAPROSYN) 500 MG tablet TAKE 1 TABLET TWICE DAILY WITH A MEAL 28 tablet 2  Hospital Medications .  stroke: mapping our early stages of recovery book   Does not apply Once  . [START ON 06/13/2018] aspirin  300 mg Rectal Daily   Or  . [START ON 06/13/2018] aspirin  325 mg Oral Daily  . enoxaparin (LOVENOX) injection  40 mg Subcutaneous Q24H  . insulin aspart  0-9 Units Subcutaneous TID WC  . Melatonin  3 mg Oral QHS  . mirtazapine  15 mg Oral QHS   ROS: History obtained from pt  General ROS: negative for - chills, fatigue, fever, night sweats, weight gain or weight loss Psychological ROS: negative for - behavioral disorder, hallucinations, memory difficulties, mood swings or suicidal ideation. + grief in setting of loss of close neighbor/friend. Ophthalmic ROS: negative for - double vision, eye pain or loss of vision. + blurry vision ENT ROS:  negative for - epistaxis, nasal discharge, oral lesions, sore throat, tinnitus or vertigo Allergy and Immunology ROS: negative for - hives or itchy/watery eyes Hematological and Lymphatic ROS: negative for - bleeding problems, bruising or swollen lymph nodes Endocrine ROS: negative for - galactorrhea, hair pattern changes, polydipsia/polyuria or temperature intolerance Respiratory ROS: negative for - cough, hemoptysis, shortness of breath or wheezing Cardiovascular ROS: negative for - chest pain, dyspnea on exertion, edema or irregular heartbeat Gastrointestinal ROS: negative for - abdominal pain, diarrhea, hematemesis, nausea/vomiting or stool incontinence Genito-Urinary ROS: negative for - dysuria, hematuria, incontinence or urinary frequency/urgency Musculoskeletal ROS: negative for - joint swelling or muscular weakness Neurological ROS: as noted in HPI Dermatological ROS: negative for rash and skin lesion changes   Physical Examination: Vitals:   06/12/18 1209 06/12/18 1400 06/12/18 1430 06/12/18 1553  BP: 133/82 138/82 (!) 143/73 (!) 153/86  Pulse: 96 94 93 96  Resp: 20 17 (!) 30 18  Temp:    97.7 F (36.5 C)  TempSrc:    Oral  SpO2: 99% 97% 97% 97%  Weight:      Height:        General - no acute distress Heart - Regular rate and rhythm - no murmer Lungs - Clear to auscultation Abdomen - Soft - non tender Extremities - Distal pulses intact - no edema Skin - Warm and dry  Neurologic Examination:   Mental Status:  Alert, oriented, thought content appropriate. Speech with mild dysarthria. No aphasia. Able to follow 3 step commands without difficulty.  Cranial Nerves:  II-bilateral visual fields intact III/IV/VI-Pupils were equal and reacted. Extraocular movements were full.  V/VII-no facial numbness and no facial weakness.  VIII-hearing normal.  X-normal speech and symmetrical palatal movement.  XII-midline tongue extension  Motor: Right : Upper extremity    5/5    Left:     Upper extremity   5/5  Lower extremity   5/5     Lower extremity   5/5 Tone and bulk:normal tone throughout; no atrophy noted Sensory: Intact to light touch in all extremities except for right arm Deep Tendon Reflexes: 2/4 throughout Plantars: Downgoing bilaterally  Cerebellar: Normal finger to nose and heel to shin bilaterally. Gait: not tested  NIHSS 2 (scored 1 for the chronic numbness in right arm + 1 for dysarthria).  LABORATORY STUDIES:  Basic Metabolic Panel: Recent Labs  Lab 06/12/18 0954  NA 138  K 3.4*  CL 103  CO2 22  GLUCOSE 192*  BUN 22  CREATININE 0.86  CALCIUM 9.7  CBC: Recent Labs  Lab 06/12/18 0954  WBC 6.3  NEUTROABS 3.1  HGB 14.1  HCT 41.0  MCV 93.2  PLT 269   Urinalysis:  Recent Labs  Lab 06/12/18 1022  COLORURINE YELLOW  LABSPEC 1.025  PHURINE 5.0  GLUCOSEU NEGATIVE  HGBUR NEGATIVE  BILIRUBINUR NEGATIVE  KETONESUR NEGATIVE  PROTEINUR NEGATIVE  NITRITE NEGATIVE  LEUKOCYTESUR NEGATIVE     EKG  EKG-reportedly twelve-lead EKG showed A. fib with RVR at the primary care's office this morning at [facility].   IMAGING: Dg Chest 2 View  Result Date: 06/12/2018 CLINICAL DATA:  palpitations for 2 days, migraine for 1 week, HTN, A-Fib, SOB- Death of neighbor this week has her upset EXAM: CHEST - 2 VIEW COMPARISON:  None. FINDINGS: Lateral view degraded by patient arm position. Moderate thoracic spondylosis. Midline trachea. Borderline cardiomegaly. No pleural effusion or pneumothorax. Clear lungs. IMPRESSION: Borderline cardiomegaly, without acute disease. Electronically Signed   By: Abigail Miyamoto M.D.   On: 06/12/2018 10:49   Ct Head Wo Contrast  Result Date: 06/12/2018 CLINICAL DATA:  67 year old female with acute headache and dizziness for 2 days. EXAM: CT HEAD WITHOUT CONTRAST TECHNIQUE: Contiguous axial images were obtained from the base of the skull through the vertex without intravenous contrast. COMPARISON:  None.  FINDINGS: Brain: A LEFT thalamic infarct appears more remote. Mild probable chronic small-vessel white matter ischemic changes noted. There is no evidence of hemorrhage, midline shift, mass lesion, mass effect, extra-axial collection or other infarct. Vascular: No hyperdense vessel or unexpected calcification. Skull: Normal. Negative for fracture or focal lesion. Sinuses/orbits: No acute finding. Other: None. IMPRESSION: 1. LEFT thalamic infarct which appears more remote but correlate clinically. 2. Mild probable chronic small-vessel white matter ischemic changes. Electronically Signed   By: Margarette Canada M.D.   On: 06/12/2018 11:40   Assessment/Plan:  67yr old lady with possible new onset Afib with waxing/waning dysarthria.  Age-indeterminate lacunar stroke seen on CTH in left thalamus. Current symptoms might be consistent with a new lacunar stroke. Other differentials include TIA. Although the location of the thalamic stroke is not typical for cardio embolic stroke, cardiac Bolick strokes can happen in small vessel territories.  Impression Evaluate for stroke/TIA.  # Dysarthria- new onset Thursday. Wax/waning in nature. Ddx: stroke or TIA. MRI to look for acute strokes not seen on CT # h/o left thalmic stroke- seems to have subsequent right arm numbness. Currently takes daily ASA # Afib RVR (new onset)-  CHADS2 score 4. Anticoagulation is indicated in setting of secondary stroke prevention.  # HTN urgency- normotensive goals. Will also r/o PRES with MRI # DM2 w/hyperglycemia- A1c pending # HLD- LDL pending   PLAN/RECS:  MRI brain without contrast CTA head/neck Echo Lipid/A1C in am Frequent neurochecks Rehab evals Management of possible A. fib/PSVT per primary team/cardiology. If A. fib is confirmed, patient will need anticoagulation for stroke prevention. Stroke team follow up  Attending neurologist's note to follow  Attending Neurohospitalist Addendum Patient seen and examined with  APP/Resident. Agree with the history and physical as documented above. Agree with the plan as documented, which I helped formulate. I have independently reviewed the chart, obtained history, review of systems and examined the patient.I have personally reviewed pertinent head/neck/spine imaging (CT/MRI). Please feel free to call with any questions. --- Amie Portland, MD [facility] Pager: [phone]  If 7pm to 7am, please call on call as listed on AMION.

## 2018-06-12 NOTE — ED Notes (Signed)
[facility] arrived  

## 2018-06-12 NOTE — ED Notes (Signed)
Attempted report. RN will call when available.

## 2018-06-12 NOTE — ED Triage Notes (Addendum)
Patient sent to ER from PCP for abnormal EKG indicating SVT. Patient reports feeling her heart race and dizziness since yesterday. Per patient has had SVT once in the pass. Denies any chest pain but reports shortness of breath. Patient on monitor-afib, no hx, EDP made aware.  Patient also reports having EMS called to house on Thursday because her son couldn't wake her after she took tylenol for headache but patient refused to come to Er. Patient still reports having the headache.

## 2018-06-12 NOTE — ED Notes (Signed)
Pt back from XR, hooked back up to monitor

## 2018-06-12 NOTE — Progress Notes (Signed)
Received patient from [facility] alert, oriented x 4 with no Neuro deficits except for complaint of headache and slight dizziness after standing a bit. Patient down for CT without any problems.

## 2018-06-12 NOTE — H&P (Signed)
History and Physical  Diana Mathews AOZ:[id number] DOB: 10-Apr-1951 DOA: 06/12/2018   PCP: Claretta Fraise, MD   Patient coming from: Home  Chief Complaint: palpitations and dysarthria  HPI:  Diana Mathews is a 67 y.o. female with medical history of palpitations, headache, and dysarthria for 2 to 3 days.  The patient states that she developed a headache on 06/07/2018 and had been taking acetaminophen.  In early afternoon on 06/10/2018, the patient took Tylenol and went to take a nap.  When her son came home from work, the patient was noted to be unresponsive and difficult to arouse.  EMS was activated.  Upon EMS arrival, the patient was noted to have a blood pressure of 215/100.  She subsequently woke up and was lucid.  She refused to go to the hospital at that time.  Later that evening, the patient developed dysphasia and dysarthria.  On 06/11/2018, she felt that her dysarthria was a little bit better, but she had felt generalized weakness.  She contacted her primary care provider, but could not get an appointment until the morning of 06/12/2018.  She continued to have palpitations.  At her PCPs office, the patient had an EKG which showed atrial fibrillation with RVR with a heart rate in the 120s.  As result, the patient was sent to the emergency department for further evaluation.  The patient denies any visual loss, focal extremity weakness, fevers, chills, chest pain, shortness breath, nausea, vomiting, diarrhea.  She feels that her dysarthria and dysphasia are better but not completely resolved in the ED. The patient denied taking any other muscle relaxers or opioids with the acetaminophen that she took on 06/10/2018. In the emergency department, the patient was initially noted to have atrial fibrillation with RVR with heart rate in the 130s.  She spontaneously converted to sinus rhythm.  She was afebrile hemodynamically stable saturating 99% on room air.  BMP showed a potassium 3.4, but was  otherwise unremarkable.  CBC was unremarkable.  Urinalysis was negative for pyuria.  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  Chest x-ray was negative for acute findings.  Because of no neurology coverage and lack of MRI capability, the patient is being transferred to [facility] for further evaluation.  Assessment/Plan: New onset atrial fibrillation -Patient has spontaneously converted to sinus rhythm -CHADSVASc  = 4 if pt is determined to have a stroke -defer anticoagulation until MRI brain is obtained to help risk stratify -ASA for now  Dysarthria/Dysphasia -certainly, the patient may have a complicated migraine, but in the setting of new onset atrial fibrillation, I am concerned about stroke -Transfer to [facility] for neurology consult and MRI -PT/OT evaluation -Speech therapy eval -CT brain-- -MRI brain-- -MRA brain-- -Carotid Duplex-- -Echo-- -LDL-- -HbA1C-- -Antiplatelet--ASA 325 mg  Essential hypertension -The patient states that she has taken herself off of antihypertensive medications because of lack of insurance -She has not taken any antihypertensive medications for over 1 year -In the setting of possible stroke, allow for permissive hypertension for now -hydralazine prn SBP >220  Hyperglycemia -check A1C -novolog sliding scale  Hypokalemia -Replete -Check magnesium  Morbid obesity -Lifestyle modification -BMI 43.60  Depression -continue remeron        Past Medical History:  Diagnosis Date  . Arthritis    osteoarthritis  . Depression    pt taking prozac  . Headache   . HLD (hyperlipidemia) 08/26/2016  .  Hypertension   . Sleep apnea    pt is supposed to be on CPAP   Past Surgical History:  Procedure Laterality Date  . ABDOMINAL HYSTERECTOMY    . CHOLECYSTECTOMY    . HERNIA REPAIR    . WRIST SURGERY Bilateral    Carpal Tunnel   Social History:  reports that she has never  smoked. She has never used smokeless tobacco. She reports that she does not drink alcohol or use drugs.   Family History  Problem Relation Age of Onset  . Arthritis Mother   . COPD Mother   . Diabetes Mother   . Hypertension Mother   . Arthritis Sister   . Diabetes Brother   . Arthritis Sister   . Diabetes Sister   . Diabetes Sister   . Diabetes Brother   . Mental illness Brother      Allergies  Allergen Reactions  . Codeine Rash     Prior to Admission medications   Medication Sig Start Date End Date Taking? Authorizing Provider  acetaminophen (TYLENOL) 500 MG tablet Take 1,000 mg by mouth every 6 (six) hours as needed.   Yes [provider]  aspirin 81 MG tablet Take 81 mg by mouth daily.   Yes [provider]  cyclobenzaprine (FLEXERIL) 10 MG tablet Take 1 tablet (10 mg total) by mouth at bedtime. 05/20/18  Yes Timmothy Euler, MD  diphenhydrAMINE (BENADRYL) 25 mg capsule Take 25 mg by mouth at bedtime as needed.   Yes [provider]  Melatonin 10 MG CAPS Take 20 mg by mouth at bedtime.   Yes [provider]  mirtazapine (REMERON) 15 MG tablet Take 1 tablet (15 mg total) by mouth at bedtime. 05/20/18  Yes Timmothy Euler, MD  mupirocin ointment (BACTROBAN) 2 % Apply 1 application topically 2 (two) times daily. 05/20/18  Yes Timmothy Euler, MD  naproxen (NAPROSYN) 500 MG tablet TAKE 1 TABLET TWICE DAILY WITH A MEAL 06/03/18  Yes Chipper Herb, MD    Review of Systems:  Constitutional:  No weight loss, night sweats, Fevers, chills,  Head&Eyes: No headache.  No vision loss.  No eye pain or scotoma ENT:  No Difficulty swallowing,Tooth/dental problems,Sore throat,  No ear ache, post nasal drip,  Cardio-vascular:  No chest pain, Orthopnea, PND, swelling in lower extremities,  dizziness GI:  No  abdominal pain, nausea, vomiting, diarrhea, loss of appetite, hematochezia, melena, heartburn, indigestion, Resp:  No shortness of  breath with exertion or at rest. No cough. No coughing up of blood .No wheezing.No chest wall deformity  Skin:  no rash or lesions.  GU:  no dysuria, change in color of urine, no urgency or frequency. No flank pain.  Musculoskeletal:  No joint pain or swelling. No decreased range of motion. No back pain.  Psych:  No change in mood or affect. No depression or anxiety. Neurologic: No headache, no dysesthesia, no focal weakness, no vision loss. No syncope  Physical Exam: Vitals:   06/12/18 0939 06/12/18 1000 06/12/18 1140  BP:  (!) 146/106 (!) 142/85  Pulse:  (!) 107 (!) 101  Resp:  (!) 22 20  SpO2:  98% 98%  Weight: 115.2 kg    Height: 5\' 4"  (1.626 m)     General:  A&O x 3, NAD, nontoxic, pleasant/cooperative Head/Eye: No conjunctival hemorrhage, no icterus, [place]/AT, No nystagmus ENT:  No icterus,  No thrush, good dentition, no pharyngeal exudate Neck:  No masses, no lymphadenpathy, no bruits CV:  RRR, no rub, no gallop, no S3 Lung:  CTAB, good air movement, no wheeze, no rhonchi Abdomen: soft/NT, +BS, nondistended, no peritoneal signs Ext: No cyanosis, No rashes, No petechiae, No lymphangitis, No edema Neuro: CNII-XII intact, strength 4/5 in bilateral upper and lower extremities, no dysmetria  Labs on Admission:  Basic Metabolic Panel: Recent Labs  Lab 06/12/18 0954  NA 138  K 3.4*  CL 103  CO2 22  GLUCOSE 192*  BUN 22  CREATININE 0.86  CALCIUM 9.7   Liver Function Tests: No results for input(s): AST, ALT, ALKPHOS, BILITOT, PROT, ALBUMIN in the last 168 hours. No results for input(s): LIPASE, AMYLASE in the last 168 hours. No results for input(s): AMMONIA in the last 168 hours. CBC: Recent Labs  Lab 06/12/18 0954  WBC 6.3  NEUTROABS 3.1  HGB 14.1  HCT 41.0  MCV 93.2  PLT 269   Coagulation Profile: No results for input(s): INR, PROTIME in the last 168 hours. Cardiac Enzymes: Recent Labs  Lab 06/12/18 0954  TROPONINI <0.03   BNP: Invalid input(s):  POCBNP CBG: No results for input(s): GLUCAP in the last 168 hours. Urine analysis:    Component Value Date/Time   COLORURINE YELLOW 06/12/2018 1022   APPEARANCEUR CLEAR 06/12/2018 1022   LABSPEC 1.025 06/12/2018 1022   PHURINE 5.0 06/12/2018 1022   GLUCOSEU NEGATIVE 06/12/2018 1022   HGBUR NEGATIVE 06/12/2018 1022   BILIRUBINUR NEGATIVE 06/12/2018 1022   KETONESUR NEGATIVE 06/12/2018 1022   PROTEINUR NEGATIVE 06/12/2018 1022   UROBILINOGEN 1.0 04/15/2010 1656   NITRITE NEGATIVE 06/12/2018 1022   LEUKOCYTESUR NEGATIVE 06/12/2018 1022   Sepsis Labs: @LABRCNTIP (procalcitonin:4,lacticidven:4) )No results found for this or any previous visit (from the past 240 hour(s)).   Radiological Exams on Admission: Dg Chest 2 View  Result Date: 06/12/2018 CLINICAL DATA:  palpitations for 2 days, migraine for 1 week, HTN, A-Fib, SOB- Death of neighbor this week has her upset EXAM: CHEST - 2 VIEW COMPARISON:  None. FINDINGS: Lateral view degraded by patient arm position. Moderate thoracic spondylosis. Midline trachea. Borderline cardiomegaly. No pleural effusion or pneumothorax. Clear lungs. IMPRESSION: Borderline cardiomegaly, without acute disease. Electronically Signed   By: Abigail Miyamoto M.D.   On: 06/12/2018 10:49   Ct Head Wo Contrast  Result Date: 06/12/2018 CLINICAL DATA:  67 year old female with acute headache and dizziness for 2 days. EXAM: CT HEAD WITHOUT CONTRAST TECHNIQUE: Contiguous axial images were obtained from the base of the skull through the vertex without intravenous contrast. COMPARISON:  None. FINDINGS: Brain: A LEFT thalamic infarct appears more remote. Mild probable chronic small-vessel white matter ischemic changes noted. There is no evidence of hemorrhage, midline shift, mass lesion, mass effect, extra-axial collection or other infarct. Vascular: No hyperdense vessel or unexpected calcification. Skull: Normal. Negative for fracture or focal lesion. Sinuses/orbits: No acute  finding. Other: None. IMPRESSION: 1. LEFT thalamic infarct which appears more remote but correlate clinically. 2. Mild probable chronic small-vessel white matter ischemic changes. Electronically Signed   By: Margarette Canada M.D.   On: 06/12/2018 11:40    EKG: Independently reviewed. Afib, no ST-T changes    Time spent:60 minutes Code Status:   FULL Family Communication:  No Family at bedside Disposition Plan: expect 1-2 day hospitalization Consults called: neurology DVT Prophylaxis: [place] Lovenox  Orson Eva, DO  [facility] Pager [phone]  If 7PM-7AM, please contact night-coverage [email or web address] Password [facility] 06/12/2018, 12:39 PM

## 2018-06-12 NOTE — ED Notes (Signed)
Hospitalist at bedside 

## 2018-06-12 NOTE — Progress Notes (Signed)
Subjective: CC: headache and weak PCP: Timmothy Euler, MD [facility]:Diana Mathews is a 67 y.o. female presenting to clinic today for:  1. Headache Patient reports that she has had elevated blood pressures over the last week.  She states that earlier in the week, she had such a severe headache that she took 4 Tylenols and laid down.  Her son came to the house and felt like she was unresponsive after trying to arouse her twice.  He called EMS and she had a measured blood pressure of 215/105.  She reports that EMS had questioned as to whether or not she had an irregular heartbeat.  She declined transport to the hospital because she "wanted to sleep".  She notes that she had some difficulty with speech yesterday.  This is since resolved.  She denies any visual disturbance, unilateral weakness, numbness or tingling, falls.  She feels like she is generally fatigued and weak today.  Denies any chest pain, shortness of breath.  She continues to have a headache today.  She notes that she was previously on blood pressure medication but has been off of it since losing her job last year.  ROS: Per HPI  Allergies  Allergen Reactions  . Codeine Rash   Past Medical History:  Diagnosis Date  . Arthritis    osteoarthritis  . Depression    pt taking prozac  . HLD (hyperlipidemia) 08/26/2016  . Hypertension   . Sleep apnea    pt is supposed to be on CPAP    Current Outpatient Medications:  .  acetaminophen (TYLENOL) 500 MG tablet, Take 1,000 mg by mouth every 6 (six) hours as needed., Disp: , Rfl:  .  aspirin 81 MG tablet, Take 81 mg by mouth daily., Disp: , Rfl:  .  cyclobenzaprine (FLEXERIL) 10 MG tablet, Take 1 tablet (10 mg total) by mouth at bedtime., Disp: 20 tablet, Rfl: 0 .  Melatonin 10 MG CAPS, Take 20 mg by mouth at bedtime., Disp: , Rfl:  .  mirtazapine (REMERON) 15 MG tablet, Take 1 tablet (15 mg total) by mouth at bedtime., Disp: 30 tablet, Rfl: 3 .  mupirocin ointment (BACTROBAN) 2  %, Apply 1 application topically 2 (two) times daily., Disp: 22 g, Rfl: 0 .  naproxen (NAPROSYN) 500 MG tablet, TAKE 1 TABLET TWICE DAILY WITH A MEAL, Disp: 28 tablet, Rfl: 2 Social History   Socioeconomic History  . Marital status: Divorced    Spouse name: Not on file  . Number of children: Not on file  . Years of education: Not on file  . Highest education level: Not on file  Occupational History  . Not on file  Social Needs  . Financial resource strain: Not on file  . Food insecurity:    Worry: Not on file    Inability: Not on file  . Transportation needs:    Medical: Not on file    Non-medical: Not on file  Tobacco Use  . Smoking status: Never Smoker  . Smokeless tobacco: Never Used  Substance and Sexual Activity  . Alcohol use: No    Alcohol/week: 0.0 standard drinks  . Drug use: No  . Sexual activity: Not on file  Lifestyle  . Physical activity:    Days per week: Not on file    Minutes per session: Not on file  . Stress: Not on file  Relationships  . Social connections:    Talks on phone: Not on file    Gets together:  Not on file    Attends religious service: Not on file    Active member of club or organization: Not on file    Attends meetings of clubs or organizations: Not on file    Relationship status: Not on file  . Intimate partner violence:    Fear of current or ex partner: Not on file    Emotionally abused: Not on file    Physically abused: Not on file    Forced sexual activity: Not on file  Other Topics Concern  . Not on file  Social History Narrative  . Not on file   Family History  Problem Relation Age of Onset  . Arthritis Mother   . COPD Mother   . Diabetes Mother   . Hypertension Mother   . Arthritis Sister   . Diabetes Brother   . Arthritis Sister   . Diabetes Sister   . Diabetes Sister   . Diabetes Brother   . Mental illness Brother     Objective: Office vital signs reviewed. BP (!) 161/86   Pulse (!) 136   Temp 98.2 F (36.8  C) (Oral)   Ht 5\' 4"  (1.626 m)   Wt 254 lb 8 oz (115.4 kg)   BMI 43.68 kg/m   Physical Examination:  General: Awake, alert, obese, No acute distress HEENT: Normal    Neck: No masses palpated. No lymphadenopathy; no goiter    Eyes: PERRLA, extraocular membranes intact, sclera white.  No exophthalmos    Throat: moist mucus membranes, no erythema.  Symmetric rise of palate Cardio: Tachycardic with seemingly regular rhythm, S1S2 heard, no murmurs appreciated Pulm: clear to auscultation bilaterally, no wheezes, rhonchi or rales; normal work of breathing on room air Extremities: warm, well perfused, No edema, cyanosis or clubbing; +2 pulses bilaterally MSK: Normal gait and normal station Neuro: 5/5 UE and LE Strength and light touch sensation grossly intact, cranial nerves II through XII grossly intact.  Alert and oriented x3.  Assessment/ Plan: 67 y.o. female   1. Hypertensive urgency Blood pressure elevated though from her report appears to be near normal then when checked earlier this week.  She was tachycardic on exam and therefore EKG was obtained.  EKG with atrial fibrillation with RVR.  Heart rate 142.  I recommended the patient seek immediate medical attention the emergency department.  I recommended that she be transported by EMS the patient declined.  She has capacity and was unable to understand the risks of transport by private vehicle, including the risk of death.  She excepted these risks and will proceed immediately to [place] was contacted and updated as to patient's condition.  2. Accelerated hypertension No medication started today given findings on EKG.  She will be seen emergently in the emergency department for supraventricular tachycardia.  3. Tachycardia - EKG 12-Lead  4. Atrial fibrillation with RVR ([place]) Appreciated on EKG.  Patient to go directly to ED.   Orders Placed This Encounter  Procedures  . EKG 12-Lead    Janora Norlander, [facility] [phone]

## 2018-06-12 NOTE — ED Provider Notes (Signed)
[facility] EMERGENCY DEPARTMENT Provider Note   CSN: [id number] Arrival date & time: 06/12/18  4627     History   Chief Complaint Chief Complaint  Patient presents with  . Palpitations    HPI Diana Mathews is a 67 y.o. female.  HPI  Pt was seen at 0950.  Per pt, c/o gradual onset and persistence of intermittent "palpitations" for the past 2 days. Pt states she was evaluated by her PMD PTA, was told her "HR was high" and to come to the ED for evaluation. Pt also states she has had a vague frontal headache for the past 2 days. Endorses hx of migraine headaches. States she took 2 OTC tylenol 2 days ago and laid down. Pt states her son tried to wake her, and "told me I was unresponsive twice when he tried to wake me up." EMS was called to the house, pt refused transport to the ED, but her BP was noted to be "215/105." Pt states she "just went to bed because I was tired." States starting yesterday her "speech wasn't right." States "it's better today, but still there." States she continues to feel generalized fatigue/weakness today. Pt non-compliant with her BP meds. Denies taking any other meds other than tylenol and her rx medications. Denies headache was sudden or maximal at onset or at any time. Denies CP/SOB, no abd pain, no N/V/D, no fevers, no neck pain, no injury, no visual changes, no focal motor weakness, no tingling/numbness in extremities, no ataxia, no facial droop.     Past Medical History:  Diagnosis Date  . Arthritis    osteoarthritis  . Depression    pt taking prozac  . Headache   . HLD (hyperlipidemia) 08/26/2016  . Hypertension   . Sleep apnea    pt is supposed to be on CPAP    Patient Active Problem List   Diagnosis Date Noted  . Dysarthria 06/12/2018  . HLD (hyperlipidemia) 08/26/2016  . HTN (hypertension) 04/15/2016  . Mood disorder ([place]) 04/15/2016  . Insomnia 04/15/2016    Past Surgical History:  Procedure Laterality Date  . ABDOMINAL HYSTERECTOMY     . CHOLECYSTECTOMY    . HERNIA REPAIR    . WRIST SURGERY Bilateral    Carpal Tunnel     OB History   None      Home Medications    Prior to Admission medications   Medication Sig Start Date End Date Taking? Authorizing Provider  acetaminophen (TYLENOL) 500 MG tablet Take 1,000 mg by mouth every 6 (six) hours as needed.   Yes [provider]  aspirin 81 MG tablet Take 81 mg by mouth daily.   Yes [provider]  cyclobenzaprine (FLEXERIL) 10 MG tablet Take 1 tablet (10 mg total) by mouth at bedtime. 05/20/18  Yes Timmothy Euler, MD  diphenhydrAMINE (BENADRYL) 25 mg capsule Take 25 mg by mouth at bedtime as needed.   Yes [provider]  Melatonin 10 MG CAPS Take 20 mg by mouth at bedtime.   Yes [provider]  mirtazapine (REMERON) 15 MG tablet Take 1 tablet (15 mg total) by mouth at bedtime. 05/20/18  Yes Timmothy Euler, MD  mupirocin ointment (BACTROBAN) 2 % Apply 1 application topically 2 (two) times daily. 05/20/18  Yes Timmothy Euler, MD  naproxen (NAPROSYN) 500 MG tablet TAKE 1 TABLET TWICE DAILY WITH A MEAL 06/03/18  Yes Chipper Herb, MD    Family History Family History  Problem Relation Age  of Onset  . Arthritis Mother   . COPD Mother   . Diabetes Mother   . Hypertension Mother   . Arthritis Sister   . Diabetes Brother   . Arthritis Sister   . Diabetes Sister   . Diabetes Sister   . Diabetes Brother   . Mental illness Brother     Social History Social History   Tobacco Use  . Smoking status: Never Smoker  . Smokeless tobacco: Never Used  Substance Use Topics  . Alcohol use: No    Alcohol/week: 0.0 standard drinks  . Drug use: No     Allergies   Codeine   Review of Systems Review of Systems ROS: Statement: All systems negative except as marked or noted in the HPI; Constitutional: Negative for fever and chills. ; ; Eyes: Negative for eye pain, redness and discharge. ; ; ENMT: Negative for ear pain,  hoarseness, nasal congestion, sinus pressure and sore throat. ; ; Cardiovascular: +palpitations. Negative for chest pain, diaphoresis, dyspnea and peripheral edema. ; ; Respiratory: Negative for cough, wheezing and stridor. ; ; Gastrointestinal: Negative for nausea, vomiting, diarrhea, abdominal pain, blood in stool, hematemesis, jaundice and rectal bleeding. . ; ; Genitourinary: Negative for dysuria, flank pain and hematuria. ; ; Musculoskeletal: Negative for back pain and neck pain. Negative for swelling and trauma.; ; Skin: Negative for pruritus, rash, abrasions, blisters, bruising and skin lesion.; ; Neuro: +headache, generalized fatigue/weakness, slurred speech. Negative for lightheadedness and neck stiffness. Negative for extremity weakness, paresthesias, involuntary movement, seizure and syncope.      Physical Exam Updated Vital Signs BP (!) 142/85   Pulse (!) 101   Resp 20   Ht 5\' 4"  (1.626 m)   Wt 115.2 kg   SpO2 98%   BMI 43.60 kg/m    10:14 Orthostatic Vital Signs LA  Orthostatic Lying   BP- Lying: 150/82   Pulse- Lying: 104       Orthostatic Sitting  BP- Sitting: 149/94Abnormal    Pulse- Sitting: 113       Orthostatic Standing at 0 minutes  BP- Standing at 0 minutes: 135/91Abnormal    Pulse- Standing at 0 minutes: 112      Physical Exam 2951: Physical examination:  Nursing notes reviewed; Vital signs and O2 SAT reviewed;  Constitutional: Well developed, Well nourished, Well hydrated, In no acute distress; Head:  Normocephalic, atraumatic; Eyes: EOMI, PERRL, No scleral icterus; ENMT: TM's clear bilat. Mouth and pharynx normal, Mucous membranes moist; Neck: Supple, Full range of motion, No lymphadenopathy. No meningeal signs.; Cardiovascular: Regular rate and rhythm, No gallop; Respiratory: Breath sounds clear & equal bilaterally, No wheezes.  Speaking full sentences with ease, Normal respiratory effort/excursion; Chest: Nontender, Movement normal; Abdomen: Soft,  Nontender, Nondistended, Normal bowel sounds; Genitourinary: No CVA tenderness; Spine:  No midline CS, TS, LS tenderness.;; Extremities: Peripheral pulses normal, No tenderness, No edema, No calf edema or asymmetry.; Neuro: AA&Ox3, Major CN grossly intact.  Speech slightly slurred.  No facial droop.  No nystagmus. Grips equal. Strength 5/5 equal bilat UE's and LE's.  DTR 2/4 equal bilat UE's and LE's.  No gross sensory deficits.  Normal cerebellar testing bilat UE's (finger-nose) and LE's (heel-shin)..; Skin: Color normal, Warm, Dry.   ED Treatments / Results  Labs (all labs ordered are listed, but only abnormal results are displayed)   EKG EKG Interpretation  Date/Time:  Saturday June 12 2018 09:41:24 EDT Ventricular Rate:  125 PR Interval:    QRS Duration: 92 QT Interval:  331 QTC Calculation: 478 R Axis:   18 Text Interpretation:  Atrial fibrillation When compared with ECG of 05/24/2001 Atrial fibrillation has replaced Normal sinus rhythm Confirmed by Francine Graven [phone]) on 06/12/2018 12:36:45 PM   EKG Interpretation  Date/Time:  Saturday June 12 2018 10:13:30 EDT Ventricular Rate:  107 PR Interval:    QRS Duration: 79 QT Interval:  358 QTC Calculation: 478 R Axis:   10 Text Interpretation:  Sinus tachycardia Since last tracing of earlier today Sinus tachycardia has replaced Atrial fibrillation Confirmed by Francine Graven [phone]) on 06/12/2018 12:37:18 PM          Radiology   Procedures Procedures (including critical care time)  Medications Ordered in ED Medications  aspirin chewable tablet 324 mg (324 mg Oral Given 06/12/18 1217)  acetaminophen (TYLENOL) tablet 650 mg (650 mg Oral Given 06/12/18 1212)     Initial Impression / Assessment and Plan / ED Course  I have reviewed the triage vital signs and the nursing notes.  Pertinent labs & imaging results that were available during my care of the patient were reviewed by me and considered in my medical  decision making (see chart for details).  MDM Reviewed: previous chart, nursing note and vitals Reviewed previous: labs and ECG Interpretation: labs, ECG, x-ray and CT scan    Results for orders placed or performed during the hospital encounter of 63/01/60  Basic metabolic panel  Result Value Ref Range   Sodium 138 135 - 145 mmol/L   Potassium 3.4 (L) 3.5 - 5.1 mmol/L   Chloride 103 98 - 111 mmol/L   CO2 22 22 - 32 mmol/L   Glucose, Bld 192 (H) 70 - 99 mg/dL   BUN 22 8 - 23 mg/dL   Creatinine, Ser 0.86 0.44 - 1.00 mg/dL   Calcium 9.7 8.9 - 10.3 mg/dL   GFR calc non Af Amer >60 >60 mL/min   GFR calc Af Amer >60 >60 mL/min   Anion gap 13 5 - 15  CBC with Differential  Result Value Ref Range   WBC 6.3 4.0 - 10.5 K/uL   RBC 4.40 3.87 - 5.11 MIL/uL   Hemoglobin 14.1 12.0 - 15.0 g/dL   HCT 41.0 36.0 - 46.0 %   MCV 93.2 78.0 - 100.0 fL   MCH 32.0 26.0 - 34.0 pg   MCHC 34.4 30.0 - 36.0 g/dL   RDW 12.4 11.5 - 15.5 %   Platelets 269 150 - 400 K/uL   Neutrophils Relative % 49 %   Neutro Abs 3.1 1.7 - 7.7 K/uL   Lymphocytes Relative 42 %   Lymphs Abs 2.6 0.7 - 4.0 K/uL   Monocytes Relative 8 %   Monocytes Absolute 0.5 0.1 - 1.0 K/uL   Eosinophils Relative 2 %   Eosinophils Absolute 0.1 0.0 - 0.7 K/uL   Basophils Relative 1 %   Basophils Absolute 0.0 0.0 - 0.1 K/uL  Troponin I  Result Value Ref Range   Troponin I <0.03 <0.03 ng/mL  Urinalysis, Routine w reflex microscopic  Result Value Ref Range   Color, Urine YELLOW YELLOW   APPearance CLEAR CLEAR   Specific Gravity, Urine 1.025 1.005 - 1.030   pH 5.0 5.0 - 8.0   Glucose, UA NEGATIVE NEGATIVE mg/dL   Hgb urine dipstick NEGATIVE NEGATIVE   Bilirubin Urine NEGATIVE NEGATIVE   Ketones, ur NEGATIVE NEGATIVE mg/dL   Protein, ur NEGATIVE NEGATIVE mg/dL   Nitrite NEGATIVE NEGATIVE   Leukocytes, UA NEGATIVE NEGATIVE  Dg Chest 2 View Result Date: 06/12/2018 CLINICAL DATA:  palpitations for 2 days, migraine for 1 week, HTN,  A-Fib, SOB- Death of neighbor this week has her upset EXAM: CHEST - 2 VIEW COMPARISON:  None. FINDINGS: Lateral view degraded by patient arm position. Moderate thoracic spondylosis. Midline trachea. Borderline cardiomegaly. No pleural effusion or pneumothorax. Clear lungs. IMPRESSION: Borderline cardiomegaly, without acute disease. Electronically Signed   By: Abigail Miyamoto M.D.   On: 06/12/2018 10:49    Ct Head Wo Contrast Result Date: 06/12/2018 CLINICAL DATA:  66 year old female with acute headache and dizziness for 2 days. EXAM: CT HEAD WITHOUT CONTRAST TECHNIQUE: Contiguous axial images were obtained from the base of the skull through the vertex without intravenous contrast. COMPARISON:  None. FINDINGS: Brain: A LEFT thalamic infarct appears more remote. Mild probable chronic small-vessel white matter ischemic changes noted. There is no evidence of hemorrhage, midline shift, mass lesion, mass effect, extra-axial collection or other infarct. Vascular: No hyperdense vessel or unexpected calcification. Skull: Normal. Negative for fracture or focal lesion. Sinuses/orbits: No acute finding. Other: None. IMPRESSION: 1. LEFT thalamic infarct which appears more remote but correlate clinically. 2. Mild probable chronic small-vessel white matter ischemic changes. Electronically Signed   By: Margarette Canada M.D.   On: 06/12/2018 11:40     1200:  APAP given for headache. Doubt SAH. CT-H with new possible newer stroke; pt will need MRI. Given pt's hx uncontrolled HTN and probable PAF over the past 2 days; will dose ASA while in the ED.  Pt currently in NSR on monitor. BP stable. T/C returned from [facility] Carles Collet, case discussed, including:  HPI, pertinent PM/SHx, VS/PE, dx testing, ED course and treatment:  Agreeable to admit/transfer to [facility].     Final Clinical Impressions(s) / ED Diagnoses   Final diagnoses:  Palpitations  Atrial fibrillation with rapid ventricular response [facility])  Slurred speech    ED  Discharge Orders    None       Francine Graven, DO 06/16/18 1616

## 2018-06-13 ENCOUNTER — Observation Stay (HOSPITAL_BASED_OUTPATIENT_CLINIC_OR_DEPARTMENT_OTHER): Payer: PRIVATE HEALTH INSURANCE

## 2018-06-13 ENCOUNTER — Observation Stay (HOSPITAL_COMMUNITY): Payer: PRIVATE HEALTH INSURANCE

## 2018-06-13 ENCOUNTER — Encounter (HOSPITAL_COMMUNITY): Payer: Self-pay | Admitting: Radiology

## 2018-06-13 DIAGNOSIS — F329 Major depressive disorder, single episode, unspecified: Secondary | ICD-10-CM | POA: Diagnosis present

## 2018-06-13 DIAGNOSIS — Z91120 Patient's intentional underdosing of medication regimen due to financial hardship: Secondary | ICD-10-CM | POA: Diagnosis not present

## 2018-06-13 DIAGNOSIS — Z885 Allergy status to narcotic agent status: Secondary | ICD-10-CM | POA: Diagnosis not present

## 2018-06-13 DIAGNOSIS — I160 Hypertensive urgency: Secondary | ICD-10-CM | POA: Diagnosis present

## 2018-06-13 DIAGNOSIS — R471 Dysarthria and anarthria: Secondary | ICD-10-CM | POA: Diagnosis present

## 2018-06-13 DIAGNOSIS — I4891 Unspecified atrial fibrillation: Secondary | ICD-10-CM | POA: Diagnosis present

## 2018-06-13 DIAGNOSIS — Z833 Family history of diabetes mellitus: Secondary | ICD-10-CM | POA: Diagnosis not present

## 2018-06-13 DIAGNOSIS — Z713 Dietary counseling and surveillance: Secondary | ICD-10-CM | POA: Diagnosis not present

## 2018-06-13 DIAGNOSIS — Z79890 Hormone replacement therapy (postmenopausal): Secondary | ICD-10-CM | POA: Diagnosis not present

## 2018-06-13 DIAGNOSIS — R29702 NIHSS score 2: Secondary | ICD-10-CM | POA: Diagnosis present

## 2018-06-13 DIAGNOSIS — Z90710 Acquired absence of both cervix and uterus: Secondary | ICD-10-CM | POA: Diagnosis not present

## 2018-06-13 DIAGNOSIS — E785 Hyperlipidemia, unspecified: Secondary | ICD-10-CM | POA: Diagnosis present

## 2018-06-13 DIAGNOSIS — E876 Hypokalemia: Secondary | ICD-10-CM | POA: Diagnosis present

## 2018-06-13 DIAGNOSIS — G4733 Obstructive sleep apnea (adult) (pediatric): Secondary | ICD-10-CM | POA: Diagnosis present

## 2018-06-13 DIAGNOSIS — Z7982 Long term (current) use of aspirin: Secondary | ICD-10-CM | POA: Diagnosis not present

## 2018-06-13 DIAGNOSIS — Z8673 Personal history of transient ischemic attack (TIA), and cerebral infarction without residual deficits: Secondary | ICD-10-CM | POA: Diagnosis not present

## 2018-06-13 DIAGNOSIS — G459 Transient cerebral ischemic attack, unspecified: Secondary | ICD-10-CM | POA: Diagnosis present

## 2018-06-13 DIAGNOSIS — R002 Palpitations: Secondary | ICD-10-CM | POA: Diagnosis present

## 2018-06-13 DIAGNOSIS — Z6841 Body Mass Index (BMI) 40.0 and over, adult: Secondary | ICD-10-CM | POA: Diagnosis not present

## 2018-06-13 DIAGNOSIS — Z825 Family history of asthma and other chronic lower respiratory diseases: Secondary | ICD-10-CM | POA: Diagnosis not present

## 2018-06-13 DIAGNOSIS — E1165 Type 2 diabetes mellitus with hyperglycemia: Secondary | ICD-10-CM | POA: Diagnosis present

## 2018-06-13 DIAGNOSIS — R4702 Dysphasia: Secondary | ICD-10-CM | POA: Diagnosis present

## 2018-06-13 DIAGNOSIS — Z9049 Acquired absence of other specified parts of digestive tract: Secondary | ICD-10-CM | POA: Diagnosis not present

## 2018-06-13 DIAGNOSIS — I10 Essential (primary) hypertension: Secondary | ICD-10-CM | POA: Diagnosis not present

## 2018-06-13 LAB — LIPID PANEL
CHOL/HDL RATIO: 5 ratio
Cholesterol: 228 mg/dL — ABNORMAL HIGH (ref 0–200)
HDL: 46 mg/dL (ref >40)
LDL CALC: 120 mg/dL — AB (ref 0–99)
Triglycerides: 308 mg/dL — ABNORMAL HIGH (ref <150)
VLDL: 62 mg/dL — AB (ref 0–40)

## 2018-06-13 LAB — MAGNESIUM: MAGNESIUM: 1.9 mg/dL (ref 1.7–2.4)

## 2018-06-13 LAB — CBC
HCT: 38.4 % (ref 36.0–46.0)
Hemoglobin: 12.6 g/dL (ref 12.0–15.0)
MCH: 31.4 pg (ref 26.0–34.0)
MCHC: 32.8 g/dL (ref 30.0–36.0)
MCV: 95.8 fL (ref 78.0–100.0)
PLATELETS: 223 10*3/uL (ref 150–400)
RBC: 4.01 MIL/uL (ref 3.87–5.11)
RDW: 12.3 % (ref 11.5–15.5)
WBC: 6.6 10*3/uL (ref 4.0–10.5)

## 2018-06-13 LAB — RAPID URINE DRUG SCREEN, HOSP PERFORMED
Amphetamines: NOT DETECTED
BARBITURATES: NOT DETECTED
BENZODIAZEPINES: NOT DETECTED
Cocaine: NOT DETECTED
Opiates: NOT DETECTED
Tetrahydrocannabinol: NOT DETECTED

## 2018-06-13 LAB — BASIC METABOLIC PANEL
ANION GAP: 9 (ref 5–15)
BUN: 20 mg/dL (ref 8–23)
CALCIUM: 9.1 mg/dL (ref 8.9–10.3)
CO2: 25 mmol/L (ref 22–32)
CREATININE: 0.82 mg/dL (ref 0.44–1.00)
Chloride: 107 mmol/L (ref 98–111)
Glucose, Bld: 121 mg/dL — ABNORMAL HIGH (ref 70–99)
Potassium: 4.2 mmol/L (ref 3.5–5.1)
SODIUM: 141 mmol/L (ref 135–145)

## 2018-06-13 LAB — GLUCOSE, CAPILLARY
GLUCOSE-CAPILLARY: 137 mg/dL — AB (ref 70–99)
GLUCOSE-CAPILLARY: 146 mg/dL — AB (ref 70–99)
GLUCOSE-CAPILLARY: 151 mg/dL — AB (ref 70–99)

## 2018-06-13 LAB — HIV ANTIBODY (ROUTINE TESTING W REFLEX): HIV Screen 4th Generation wRfx: NONREACTIVE

## 2018-06-13 LAB — HEMOGLOBIN A1C
Hgb A1c MFr Bld: 6.8 % — ABNORMAL HIGH (ref 4.8–5.6)
Mean Plasma Glucose: 148.46 mg/dL

## 2018-06-13 LAB — ECHOCARDIOGRAM COMPLETE
Height: 64 in
Weight: 4064 oz

## 2018-06-13 MED ORDER — LOPERAMIDE HCL 2 MG PO CAPS
2.0000 mg | ORAL_CAPSULE | Freq: Three times a day (TID) | ORAL | Status: DC | PRN
  Administered 2018-06-13: 2 mg via ORAL
  Filled 2018-06-13: qty 1

## 2018-06-13 MED ORDER — POTASSIUM CHLORIDE 10 MEQ/100ML IV SOLN
10.0000 meq | INTRAVENOUS | Status: AC
  Administered 2018-06-13 (×2): 10 meq via INTRAVENOUS
  Filled 2018-06-13 (×2): qty 100

## 2018-06-13 MED ORDER — CYCLOBENZAPRINE HCL 10 MG PO TABS
5.0000 mg | ORAL_TABLET | Freq: Once | ORAL | Status: AC
  Administered 2018-06-13: 5 mg via ORAL
  Filled 2018-06-13: qty 1

## 2018-06-13 MED ORDER — ATORVASTATIN CALCIUM 40 MG PO TABS
40.0000 mg | ORAL_TABLET | Freq: Every day | ORAL | Status: DC
  Administered 2018-06-13: 40 mg via ORAL
  Filled 2018-06-13: qty 1

## 2018-06-13 NOTE — Evaluation (Signed)
Physical Therapy Evaluation Patient Details Name: Diana Mathews MRN: [id number] DOB: 1951/05/02 Today's Date: 06/13/2018   History of Present Illness  Pt is a 67 y.o. female admitted to [facility] for new onset atrial fibrillation and slurred speech. CT revealing L thalamic lacunar infarct. MRI pending. PMH significant for hypertension, hyperlipidemia, arthritis, headache, depression, and sleep apnea.  Clinical Impression  Patient evaluated by Physical Therapy with no further acute PT needs identified. All education has been completed and the patient has no further questions. I encouraged pt to continue to follow up with her primary care physician for BP control;  See below for any follow-up Physical Therapy or equipment needs. PT is signing off. Thank you for this referral.     Follow Up Recommendations No PT follow up    Equipment Recommendations  None recommended by PT    Recommendations for Other Services       Precautions / Restrictions Precautions Precautions: None Restrictions Weight Bearing Restrictions: No      Mobility  Bed Mobility Overal bed mobility: Independent                Transfers Overall transfer level: Modified independent               General transfer comment: Increased time due to line management.   Ambulation/Gait Ambulation/Gait assistance: Supervision;Independent Gait Distance (Feet): 200 Feet Assistive device: None Gait Pattern/deviations: Step-through pattern     General Gait Details: Overall walking well; one small loss of balance with turn from which she recovered independentlySupervision progressing to independent   Stairs            Wheelchair Mobility    Modified Rankin (Stroke Patients Only)       Balance Overall balance assessment: Mild deficits observed, not formally tested                                           Pertinent Vitals/Pain Pain Assessment: No/denies pain    Home  Living Family/patient expects to be discharged to:: Private residence Living Arrangements: Children(adult son) Available Help at Discharge: Family;Available PRN/intermittently Type of Home: Apartment Home Access: Stairs to enter Entrance Stairs-Rails: Psychiatric nurse of Steps: 2 Home Layout: One level Home Equipment: Grab bars - tub/shower      Prior Function Level of Independence: Independent         Comments: 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 Some decreased accuracy with finger to nose testing but not present during functional tasks.     Lower Extremity Assessment Lower Extremity Assessment: Overall WFL for tasks assessed       Communication   Communication: No difficulties  Cognition Arousal/Alertness: Awake/alert Behavior During Therapy: WFL for tasks assessed/performed Overall Cognitive Status: Within Functional Limits for tasks assessed                                        General Comments General comments (skin integrity, edema, etc.): Educated pt in signs and symptoms of CVA    Exercises  Assessment/Plan    PT Assessment Patent does not need any further PT services  PT Problem List         PT Treatment Interventions      PT Goals (Current goals can be found in the Care Plan section)  Acute Rehab PT Goals Patient Stated Goal: "to get home today and quit my job" PT Goal Formulation: All assessment and education complete, DC therapy    Frequency     Barriers to discharge        Co-evaluation               AM-PAC PT "6 Clicks" Daily Activity  Outcome Measure Difficulty turning over in bed (including adjusting bedclothes, sheets and blankets)?:  None Difficulty moving from lying on back to sitting on the side of the bed? : None Difficulty sitting down on and standing up from a chair with arms (e.g., wheelchair, bedside commode, etc,.)?: None Help needed moving to and from a bed to chair (including a wheelchair)?: None Help needed walking in hospital room?: None Help needed climbing 3-5 steps with a railing? : None 6 Click Score: 24    End of Session Equipment Utilized During Treatment: Gait belt Activity Tolerance: Patient tolerated treatment well Patient left: in bed;with call bell/phone within reach(sitting EOB) Nurse Communication: Mobility status PT Visit Diagnosis: Other symptoms and signs involving the nervous system (G90.211)    Time: 1552-0802 PT Time Calculation (min) (ACUTE ONLY): 17 min   Charges:   PT Evaluation $PT Eval Low Complexity: [place], PT  Acute Rehabilitation Services Pager [phone] Office [place] 06/13/2018, 11:05 AM

## 2018-06-13 NOTE — Evaluation (Signed)
Speech Language Pathology Evaluation Patient Details Name: Diana Mathews MRN: [id number] DOB: 06-25-1951 Today's Date: 06/13/2018 Time: 8453-6468 SLP Time Calculation (min) (ACUTE ONLY): 9 min  Problem List:  Patient Active Problem List   Diagnosis Date Noted  . Dysarthria 06/12/2018  . New onset atrial fibrillation ([place]) 06/12/2018  . Hyperglycemia 06/12/2018  . Obesity, Class III, BMI 40-49.9 (morbid obesity) ([place]) 06/12/2018  . Atrial fibrillation with rapid ventricular response ([place])   . Palpitations   . Slurred speech   . HLD (hyperlipidemia) 08/26/2016  . HTN (hypertension) 04/15/2016  . Mood disorder ([place]) 04/15/2016  . Insomnia 04/15/2016   Past Medical History:  Past Medical History:  Diagnosis Date  . Arthritis    osteoarthritis  . Depression    pt taking prozac  . Headache   . HLD (hyperlipidemia) 08/26/2016  . Hypertension   . Sleep apnea    pt is supposed to be on CPAP   Past Surgical History:  Past Surgical History:  Procedure Laterality Date  . ABDOMINAL HYSTERECTOMY    . CHOLECYSTECTOMY    . HERNIA REPAIR    . WRIST SURGERY Bilateral    Carpal Tunnel   HPI:  Pt is a 67 y.o. female admitted to [facility] for new onset atrial fibrillation and slurred speech. CT revealing L thalamic lacunar infarct. MRI Negative for acute infarct. PMH significant for hypertension, hyperlipidemia, arthritis, headache, depression, and sleep apnea.   Assessment / Plan / Recommendation Clinical Impression   Pt's cognitive-linguistic skills are within normal limits for tasks assessed. Speech is fluent, clear, without dysarthria. Verbal expression and auditory comprehension intact for moderately complex conversation. SLP administered [place] as well as supplemental cognitive-linguistic tasks for assessment. Pt scored 26/30, indicating cognitive skills within normal limits (26 and above is WNL). Reading intact for mod complex paragraph level material. Pt reports she is at  her baseline level of function. No further skilled ST needs identified; SLP will s/o.     SLP Assessment  SLP Recommendation/Assessment: Patient does not need any further Speech Lanaguage Pathology Services SLP Visit Diagnosis: Cognitive communication deficit (R41.841)    Follow Up Recommendations  None    Frequency and Duration Other (Comment)(evaluation only)         SLP Evaluation Cognition  Overall Cognitive Status: Within Functional Limits for tasks assessed Arousal/Alertness: Awake/alert Orientation Level: Oriented X4 Attention: Selective;Alternating Selective Attention: Appears intact Alternating Attention: Appears intact Memory: Appears intact(delayed recall 4/5) Awareness: Appears intact Problem Solving: Appears intact Executive Function: Reasoning Reasoning: Appears intact Safety/Judgment: Appears intact       Comprehension  Auditory Comprehension Overall Auditory Comprehension: Appears within functional limits for tasks assessed Yes/No Questions: Within Functional Limits Commands: Within Functional Limits Conversation: Other (comment)(mod complex) Visual Recognition/Discrimination Discrimination: Not tested Reading Comprehension Reading Status: Within funtional limits    Expression Expression Primary Mode of Expression: Verbal Verbal Expression Overall Verbal Expression: Appears within functional limits for tasks assessed Initiation: No impairment Automatic Speech: Name;Social Response Level of Generative/Spontaneous Verbalization: Financial controller Expression Dominant Hand: Right Written Expression: Not tested   Oral / Motor  Oral Motor/Sensory Function Overall Oral Motor/Sensory Function: Within functional limits Motor Speech Overall Motor Speech: Appears within functional limits for tasks assessed Respiration: Within functional limits Phonation: Normal Resonance: Within functional limits Articulation: Within functional  limitis Intelligibility: Intelligible Motor Planning: Witnin functional limits Motor Speech Errors: Not applicable   GO  Deneise Lever, [place], [place] Speech-Language Pathologist [place] 06/13/2018, 9:31 AM

## 2018-06-13 NOTE — Progress Notes (Signed)
.   [facility] PROGRESS NOTE  Diana Mathews DBZ:[id number] DOB: 1950/12/06 DOA: 06/12/2018 PCP: Claretta Fraise, MD   Narrative:  40 ? Body mass index is 43.6 kg/m., chr muscle pains, sleep disorder Admit at [facility] from PCP office with New AFib, L sided weakness slurred speech/dysarthria  headaches and severely elevated blood pressures and cocnern for CVA-tx to [facility] for the same Neuro consulting   [place] ~ 2-3 on monitors seems to have converted-needs 30-d monitor-ILR probably will be placed a.m. Defer AC choices at this stage to neuro-would at least need ASA  Glucose tolerance, probable new onset diabetes mellitus-Ai1c 6.8 hesitate to use metformin given diarrhea-may need outpatient counseling regarding the same-121-137 She states she will talk to her PCP and exercise options as well as possible medications as an outpatient and does not wish to start anything at this time  ?TIA-MRI neg-finish work-up if an echo still pending  Sleep disorder-some correlation with strokes-needs outpatient sleep study  BMI 43-needs outpatient counseling reviewed weight loss  Chronic diarrhea-takes over-the-counter meds at home-has been going on for 9 years-low suspicion for infectious process with no fever     DVT prophylaxis: Lovenox code Status: f   Family Communication: none   Disposition Plan: d/c after ILR?    Verlon Au, MD  [facility] Direct contact: [phone] --Via amion app OR  --[email or web address]; password [email or web address]  7PM-7AM contact night coverage as above 06/13/2018, 8:53 AM  LOS: 0 days   Consultants:  Neurology  Cardiology  Procedures:  Multiple  Antimicrobials:  None  Interval history/Subjective: Seen earlier this morning in no distress Reexam later on and her symptoms essentially resolved Having diarrhea states this is normal for her when she does not eat-no fever no chills no burning in the urine no cough no cold  Objective:  Vitals:   Vitals:   06/13/18 0300 06/13/18 0815  BP: 137/81 (!) 161/84  Pulse: 80 98  Resp: 17 20  Temp: 97.7 F (36.5 C) 97.9 F (36.6 C)  SpO2: 97% 96%    Exam:  . Neuro intact Mallampati 4 no JVD . Chest clear no added sound . S1-S2 regular rate rhythm-monitor show predominant in his rhythm with some PVCs . Abdomen soft obese nontender poor exam no lower extremity edema . Power 5/5 smile symmetric external ocular movements intact no nystagmus . Sensory grossly intact, vibratory sensation normal reflexes are 2/3   I have personally reviewed the following:   Labs:  Cholesterol 228, LDL 120, VLDL 62, triglycerides 308  A1c 6.8  Imaging studies:  multiple  Medical tests:  no   Test discussed with performing physician:  Yes, neuro MD  Decision to obtain old records:  yes  Review and summation of old records:  yes  Scheduled Meds: . aspirin  300 mg Rectal Daily   Or  . aspirin  325 mg Oral Daily  . enoxaparin (LOVENOX) injection  40 mg Subcutaneous Q24H  . insulin aspart  0-9 Units Subcutaneous TID WC  . Melatonin  3 mg Oral QHS  . mirtazapine  15 mg Oral QHS   Continuous Infusions: . sodium chloride 10 mL/hr at 06/13/18 0057    Active Problems:   Dysarthria   New onset atrial fibrillation ([facility])   Hyperglycemia   Obesity, Class III, BMI 40-49.9 (morbid obesity) ([place])   LOS: 0 days

## 2018-06-13 NOTE — Plan of Care (Signed)
Pt is able to communicate needs effectively as there are not any residual cognitive or speech issues shown at this time.  Pt has no signs or symptoms that would indicate an aspiration risk at this time.  Will continue to monitor for safety.

## 2018-06-13 NOTE — Progress Notes (Signed)
Patient given cracker and cup of water per MD with RN and MD at bedside. Patient without any side effects, MD placed pt on dys 3 diet. Speech to further clear patient after eval.

## 2018-06-13 NOTE — Progress Notes (Signed)
Patient requesting anti-diarrheal, MD paged.

## 2018-06-13 NOTE — Progress Notes (Signed)
STROKE TEAM PROGRESS NOTE   HISTORY OF PRESENT ILLNESS (per record) Diana Mathews is an 67 y.o. female who was admitted to [facility] for new onset Afib discovered at her PCP office by 12-lead EKG.  She reports that she has been quite distressed over the last week due to the death of her neighbor. On Feb 13, 2023, she reports that she had a racing HR. Her son at bedside says that she has been having waxing/waning speech changes over last few days.  Speech was described as being slurred.  It has been improving over the last 2 days but is still not back to baseline. CTH showed a left thalamic infarct likely remote (she reports right arm numbness for "a long time"). MRI ordered to assess for acute changes that would relate to her new speech changes. She currently has some mild dysarthria on exam, NIHSS 2 (scored 1 for the chronic numbness in right arm + 1 for dysarthria).  Patient reports history of hypertension but not taking medications because of lack of insurance. Neurology was consulted for stroke work up. No acute stroke treatments offered due to several days outside window and low NIH stroke scale.  Last seen normal-7 days ago NIH stroke scale-2 TPA given: Known-outside the window.  Stroke Risks: obesity, HTN, HLD, OSA, prev stroke   SUBJECTIVE (INTERVAL HISTORY) Back to baseline, no complaints     OBJECTIVE Vitals:   06/12/18 2300 06/12/18 2311 06/13/18 0300 06/13/18 0815  BP: (!) 126/57 (!) 143/76 137/81 (!) 161/84  Pulse: 81  80 98  Resp: 15  17 20   Temp: 97.6 F (36.4 C)  97.7 F (36.5 C) 97.9 F (36.6 C)  TempSrc: Oral  Oral Oral  SpO2: 98%  97% 96%  Weight:      Height:        CBC:  Recent Labs  Lab 06/12/18 0954 06/13/18 0246  WBC 6.3 6.6  NEUTROABS 3.1  --   HGB 14.1 12.6  HCT 41.0 38.4  MCV 93.2 95.8  PLT 269 277    Basic Metabolic Panel:  Recent Labs  Lab 06/12/18 0954 06/13/18 0246  NA 138 141  K 3.4* 4.2  CL 103 107  CO2 22 25   GLUCOSE 192* 121*  BUN 22 20  CREATININE 0.86 0.82  CALCIUM 9.7 9.1  MG  --  1.9    Lipid Panel:     Component Value Date/Time   CHOL 228 (H) 06/13/2018 0246   CHOL 190 08/26/2016 0855   TRIG 308 (H) 06/13/2018 0246   HDL 46 06/13/2018 0246   HDL 61 08/26/2016 0855   CHOLHDL 5.0 06/13/2018 0246   VLDL 62 (H) 06/13/2018 0246   LDLCALC 120 (H) 06/13/2018 0246   LDLCALC 100 (H) 08/26/2016 0855   HgbA1c:  Lab Results  Component Value Date   HGBA1C 6.8 (H) 06/13/2018   Urine Drug Screen: No results found for: LABOPIA, COCAINSCRNUR, LABBENZ, AMPHETMU, THCU, LABBARB  Alcohol Level No results found for: ETH  IMAGING   Ct Angio Head W Or Wo Contrast Ct Angio Neck W Or Wo Contrast 06/12/2018 IMPRESSION:  1. Negative for emergent large vessel occlusion.  2. Highly diminutive right vertebral artery which terminates in the upper neck. Dominant left vertebral artery which supplies the basilar without stenosis, and otherwise negative posterior circulation.  3. Minimal carotid atherosclerosis.  Negative anterior circulation.    Dg Chest 2 View 06/12/2018 IMPRESSION:  Borderline cardiomegaly, without acute disease.    Ct Head  Wo Contrast 06/12/2018 IMPRESSION:  1. LEFT thalamic infarct which appears more remote but correlate clinically.  2. Mild probable chronic small-vessel white matter ischemic changes.     Mr Brain Wo Contrast 06/13/2018 IMPRESSION:  Negative for acute infarct. 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 Chronic ischemia in the deep white matter bilaterally.     Transthoracic Echocardiogram - pending 00/00/00     PHYSICAL EXAM Blood pressure (!) 161/84, pulse 98, temperature 97.9 F (36.6 C), temperature source Oral, resp. rate 20, height 5\' 4"  (1.626 m), weight 115.2 kg, SpO2 96 %.  AAOx4 PERRL No ataxia 5/5 stregthn throughout Normal speech No sensory deficit      HOME MEDICATIONS:  Medications Prior to Admission  Medication Sig  Dispense Refill  . acetaminophen (TYLENOL) 500 MG tablet Take 1,000 mg by mouth every 6 (six) hours as needed.    Marland Kitchen aspirin 81 MG tablet Take 81 mg by mouth daily.    . cyclobenzaprine (FLEXERIL) 10 MG tablet Take 1 tablet (10 mg total) by mouth at bedtime. 20 tablet 0  . diphenhydrAMINE (BENADRYL) 25 mg capsule Take 25 mg by mouth at bedtime as needed.    . Melatonin 10 MG CAPS Take 20 mg by mouth at bedtime.    . mirtazapine (REMERON) 15 MG tablet Take 1 tablet (15 mg total) by mouth at bedtime. 30 tablet 3  . mupirocin ointment (BACTROBAN) 2 % Apply 1 application topically 2 (two) times daily. 22 g 0  . naproxen (NAPROSYN) 500 MG tablet TAKE 1 TABLET TWICE DAILY WITH A MEAL 28 tablet 2      HOSPITAL MEDICATIONS:  . aspirin  300 mg Rectal Daily   Or  . aspirin  325 mg Oral Daily  . enoxaparin (LOVENOX) injection  40 mg Subcutaneous Q24H  . insulin aspart  0-9 Units Subcutaneous TID WC  . Melatonin  3 mg Oral QHS  . mirtazapine  15 mg Oral QHS    ALLERGIES Allergies  Allergen Reactions  . Codeine Rash    PAST MEDICAL HISTORY Past Medical History:  Diagnosis Date  . Arthritis    osteoarthritis  . Depression    pt taking prozac  . Headache   . HLD (hyperlipidemia) 08/26/2016  . Hypertension   . Sleep apnea    pt is supposed to be on CPAP    SURGICAL HISTORY Past Surgical History:  Procedure Laterality Date  . ABDOMINAL HYSTERECTOMY    . CHOLECYSTECTOMY    . HERNIA REPAIR    . WRIST SURGERY Bilateral    Carpal Tunnel      ASSESSMENT/PLAN [name] is a 67 y.o. female with history of OSA, HTN, HLD, HA, arthritis and depression presenting with speech difficulties. She did not receive IV t-PA due to late presentation.  Probable TIA:    Afib vs modifiable stroke risk factors   CT head - LEFT thalamic infarct which appears more remote but correlate clinically.   MRI head - Negative for acute infarct. Chronic infarct in the left thalamus  MRA head -  not performed  CTA H&N - Highly diminutive right vertebral artery which terminates in the upper neck.   Carotid Doppler - CTA neck performed - carotid dopplers not indicated.  2D Echo - pending  LDL - 120  HgbA1c - 6.8  UDS - pending  VTE prophylaxis - Lovenox  Diet Heart healthy with thin liquids.  aspirin 81 mg daily prior to admission, now on aspirin 325 mg daily  Patient counseled to be compliant with her antithrombotic medications  Ongoing aggressive stroke risk factor management  Therapy recommendations:  No f/u therapies recommended  Disposition:  Pending  Hypertension  Stable . Permissive hypertension (OK if < 220/120) but gradually normalize in 5-7 days . Long-term BP goal normotensive  Hyperlipidemia  Lipid lowering medication PTA:  none  LDL 120, goal < 70  Current lipid lowering medication: will add Lipitor 40 mg daily  Continue statin at discharge   Diabetes  HgbA1c 6.8, goal < 7.0  Controlled    Other Stroke Risk Factors  Advanced age  Obesity, Body mass index is 43.6 kg/m., recommend weight loss, diet and exercise as appropriate   Hx stroke/TIA by imaging  Obstructive sleep apnea,  CPAP at home  Atrial Fib - not anticoagulated  Other Active Problems  Newly diagnosed afib - if confirmed pt will need anticoagulation.   CHA2DS2-VASc Score  (0=low risk) (1=moderate risk)  (?2 = high risk anticoagulation recommended) Age in Years:(<65 0) (65-75 +1) (75 or > +2)  Sex: (Female 0)  (Female +1) Hypertension History: yes +1   Diabetes Mellitus: yes +1 ? Congestive Heart Failure History: yes +1 Vascular Disease History: yes +1  Stroke/TIA/Thromboembolism History: yes +2   Unclear if patient has true Afib vs SVT- no documented reports of EKG only verbal report  - Since patient did not have an  acute stroke and most likely TIA, would continue ASA for now and get Loop recorder unless Afib or arrhythmia captured during hospital stay. If captured then patient qualifies for anticoagulation and would start anticoagulation and stop asa. If not continue ASA and loop recorder with outpatient neurology follow up.   Hospital day # 0  To contact Stroke Continuity provider, please refer to [email or web address] After hours, contact General Neurology

## 2018-06-13 NOTE — Evaluation (Signed)
Occupational Therapy Evaluation Patient Details Name: Diana Mathews MRN: [id number] DOB: [birth year] Today's Date: 06/13/2018    History of Present Illness Pt is a 67 y.o. female admitted to [facility] for new onset atrial fibrillation and slurred speech. CT revealing L thalamic lacunar infarct. MRI pending. PMH significant for hypertension, hyperlipidemia, arthritis, headache, depression, and sleep apnea.   Clinical Impression   Patient evaluated by Occupational Therapy with no further acute OT needs identified. Pt is able to complete all ADL in hospital setting with modified independence. She demonstrated good coordination, cognition, and functional use of vision throughout functional tasks. She does present with baseline R handed numbness due to carpal tunnel at baseline. All education has been completed and the patient has no further questions. See below for any follow-up Occupational Therapy or equipment needs. OT to sign off. Thank you for referral.      Follow Up Recommendations  No OT follow up;Supervision - Intermittent    Equipment Recommendations  None recommended by OT    Recommendations for Other Services       Precautions / Restrictions Precautions Precautions: None Restrictions Weight Bearing Restrictions: No      Mobility Bed Mobility Overal bed mobility: Independent                Transfers Overall transfer level: Modified independent               General transfer comment: Increased time due to line management.     Balance Overall balance assessment: Mild deficits observed, not formally tested                                         ADL either performed or assessed with clinical judgement   ADL Overall ADL's : Modified independent                                       General ADL Comments: Able to complete basic ADL in hospital setting with increased time due to lines and otherwise independent.       Vision Baseline Vision/History: No visual deficits Patient Visual Report: No change from baseline Vision Assessment?: Yes Eye Alignment: Within Functional Limits Ocular Range of Motion: Within Functional Limits Alignment/Gaze Preference: Within Defined Limits Tracking/Visual Pursuits: Able to track stimulus in all quads without difficulty Saccades: Within functional limits Convergence: Within functional limits Visual Fields: No apparent deficits Additional Comments: No visual deficits noted throughout assessment during functional tasks and vision screening     Perception     Praxis      Pertinent Vitals/Pain Pain Assessment: No/denies pain     Hand Dominance Right   Extremity/Trunk Assessment Upper Extremity Assessment Upper Extremity Assessment: Generalized weakness;RUE deficits/detail RUE Deficits / Details: Chronic R hand numbness due to carpal tunnel syndrome at baseline. Some decreased accuracy with finger to nose testing but not present during functional tasks.    Lower Extremity Assessment Lower Extremity Assessment: Overall WFL for tasks assessed       Communication Communication Communication: No difficulties   Cognition Arousal/Alertness: Awake/alert Behavior During Therapy: WFL for tasks assessed/performed Overall Cognitive Status: Within Functional Limits for tasks assessed  General Comments  Pt reports that her speech is improved.     Exercises     Shoulder Instructions      Home Living Family/patient expects to be discharged to:: Private residence Living Arrangements: Children(adult son) Available Help at Discharge: Family;Available PRN/intermittently Type of Home: Apartment Home Access: Stairs to enter Entrance Stairs-Number of Steps: 2 Entrance Stairs-Rails: Right;Left Home Layout: One level     Bathroom Shower/Tub: Teacher, early years/pre: Standard     Home Equipment:  Grab bars - tub/shower          Prior Functioning/Environment Level of Independence: Independent        Comments: Works for [employer] with a pt        OT Problem List: Decreased activity tolerance;Impaired balance (sitting and/or standing)      OT Treatment/Interventions:      OT Goals(Current goals can be found in the care plan section) Acute Rehab OT Goals Patient Stated Goal: "to get home today and quit my job" OT Goal Formulation: With patient  OT Frequency:     Barriers to D/C:            Co-evaluation              AM-PAC PT "6 Clicks" Daily Activity     Outcome Measure Help from another person eating meals?: Total(currently NPO but able to complete hand to mouth) Help from another person taking care of personal grooming?: None Help from another person toileting, which includes using toliet, bedpan, or urinal?: None Help from another person bathing (including washing, rinsing, drying)?: None Help from another person to put on and taking off regular upper body clothing?: None Help from another person to put on and taking off regular lower body clothing?: None 6 Click Score: 21   End of Session Nurse Communication: Mobility status  Activity Tolerance: Patient tolerated treatment well Patient left: in bed;with call bell/phone within reach(seated at EOB (offered recliner and pt declined))  OT Visit Diagnosis: Muscle weakness (generalized) (M62.81)                Time: 8315-1761 OT Time Calculation (min): 18 min Charges:  OT General Charges $OT Visit: 1 Visit OT Evaluation $OT Eval Low Complexity: 1 Low  Norman Herrlich, MS OTR/L  Pager: [place] A Miquel Lamson 06/13/2018, 8:44 AM

## 2018-06-13 NOTE — Progress Notes (Signed)
  Echocardiogram 2D Echocardiogram has been performed.  Diana Mathews 06/13/2018, 3:42 PM

## 2018-06-13 NOTE — Progress Notes (Signed)
Paged echo, pt not currently assigned to list for today; tech said she will see if pt can be added if able.

## 2018-06-13 NOTE — Evaluation (Signed)
Clinical/Bedside Swallow Evaluation Patient Details  Name: Diana Mathews MRN: [id number] Date of Birth: [birth year]  Today's Date: 06/13/2018 Time: SLP Start Time (ACUTE ONLY): 0910 SLP Stop Time (ACUTE ONLY): 0916 SLP Time Calculation (min) (ACUTE ONLY): 6 min  Past Medical History:  Past Medical History:  Diagnosis Date  . Arthritis    osteoarthritis  . Depression    pt taking prozac  . Headache   . HLD (hyperlipidemia) 08/26/2016  . Hypertension   . Sleep apnea    pt is supposed to be on CPAP   Past Surgical History:  Past Surgical History:  Procedure Laterality Date  . ABDOMINAL HYSTERECTOMY    . CHOLECYSTECTOMY    . HERNIA REPAIR    . WRIST SURGERY Bilateral    Carpal Tunnel   HPI:  Pt is a 67 y.o. female admitted to [facility] for new onset atrial fibrillation and slurred speech. CT revealing L thalamic lacunar infarct. MRI Negative for acute infarct. PMH significant for hypertension, hyperlipidemia, arthritis, headache, depression, and sleep apnea.   Assessment / Plan / Recommendation Clinical Impression  Patient presents with oropharyngeal swallow which appears at bedside to be within functional limits with adequate airway protection. No overt signs of aspiration observed despite challenging with consecutive straw sips of thin liquids in excess of 3oz. No focal cranial nerve weakness noted during oral motor examination and pt denies history of dysphagia. Mastication and oral clearance adequate for regular solids. Recommend regular diet with thin liquids, no further skilled ST needs identified. Will s/o.   SLP Visit Diagnosis: Dysphagia, unspecified (R13.10)    Aspiration Risk  No limitations    Diet Recommendation Regular;Thin liquid   Liquid Administration via: Cup;Straw Medication Administration: Whole meds with liquid Supervision: Patient able to self feed    Other  Recommendations Oral Care Recommendations: Oral care BID   Follow up Recommendations None       Frequency and Duration            Prognosis Prognosis for Safe Diet Advancement: Good      Swallow Study   General Date of Onset: 06/12/18 HPI: Pt is a 67 y.o. female admitted to [facility] for new onset atrial fibrillation and slurred speech. CT revealing L thalamic lacunar infarct. MRI Negative for acute infarct. PMH significant for hypertension, hyperlipidemia, arthritis, headache, depression, and sleep apnea. Type of Study: Bedside Swallow Evaluation Previous Swallow Assessment: none on file Diet Prior to this Study: Dysphagia 3 (soft);Thin liquids Temperature Spikes Noted: No Respiratory Status: Room air History of Recent Intubation: No Behavior/Cognition: Alert;Cooperative;Pleasant mood Oral Cavity Assessment: Within Functional Limits Oral Care Completed by SLP: No Oral Cavity - Dentition: Dentures, top;Missing dentition Vision: Functional for self-feeding Self-Feeding Abilities: Able to feed self Patient Positioning: Upright in bed Baseline Vocal Quality: Normal Volitional Cough: Strong Volitional Swallow: Able to elicit    Oral/Motor/Sensory Function Overall Oral Motor/Sensory Function: Within functional limits   Ice Chips Ice chips: Not tested   Thin Liquid Thin Liquid: Within functional limits Presentation: Cup;Straw;Self Fed    Nectar Thick Nectar Thick Liquid: Not tested   Honey Thick Honey Thick Liquid: Not tested   Puree Puree: Not tested   Solid     Solid: Within functional limits Presentation: [place], [place], CCC-SLP Speech-Language Pathologist [phone]  Aliene Altes 06/13/2018,9:26 AM

## 2018-06-14 ENCOUNTER — Encounter (HOSPITAL_COMMUNITY): Payer: Self-pay | Admitting: *Deleted

## 2018-06-14 DIAGNOSIS — I4891 Unspecified atrial fibrillation: Secondary | ICD-10-CM

## 2018-06-14 LAB — GLUCOSE, CAPILLARY
GLUCOSE-CAPILLARY: 133 mg/dL — AB (ref 70–99)
Glucose-Capillary: 135 mg/dL — ABNORMAL HIGH (ref 70–99)

## 2018-06-14 MED ORDER — APIXABAN 5 MG PO TABS: 5.0000 mg | ORAL_TABLET | Freq: Two times a day (BID) | ORAL | 11 refills | Status: DC

## 2018-06-14 MED ORDER — ATORVASTATIN CALCIUM 80 MG PO TABS: 80.0000 mg | ORAL_TABLET | Freq: Every day | ORAL | 11 refills | Status: DC

## 2018-06-14 NOTE — Care Management Note (Signed)
Case Management Note  Patient Details  Name: Diana Mathews MRN: [id number] Date of Birth: 1951/03/23  Subjective/Objective:   Pt admitted on 06/12/18 with new onset afib and dysarthria.  PTA, pt independent of ADLS.                   Action/Plan: Pt to discharge on Eliquis therapy for Afib.  Checked Rx benefits; unfortunately, pt does not have rx coverage, per pharmacy, only discount card.  Pt's Medicare will be effective in December, and she has already signed up for drug plan with [employer].  Cost of Eliquis is over $500 with current Rx benefits.   Notified Dr. Verlon Au of cost of med.  Pt provided with 30 day free trial card for first Rx of Eliquis.  Also provide [employer] patient assistance form for Eliquis; instructed pt to fill out patient portion and take to her PCP to complete the rest and fax back to drug company.  Stressed the importance of completing forms and following up with PCP.  She verbalizes understanding, and states she will follow up ASAP.  She has PCP appt scheduled next week.    Expected Discharge Date:  06/14/18               Expected Discharge Plan:  Home/Self Care  In-House Referral:     Discharge planning Services  CM Consult, Medication Assistance  Post Acute Care Choice:    Choice offered to:     DME Arranged:    DME Agency:     HH Arranged:    HH Agency:     Status of Service:  Completed, signed off  If discussed at [employer] of Stay Meetings, dates discussed:    Additional Comments:  Ella Bodo, RN 06/14/2018, 3:46 PM

## 2018-06-14 NOTE — Progress Notes (Signed)
STROKE TEAM PROGRESS NOTE   HISTORY OF PRESENT ILLNESS (per Dr Rory Percy) Diana Mathews is an 67 y.o. female who was admitted to [facility] for new onset Afib discovered at her PCP office by 12-lead EKG.  She reports that she has been quite distressed over the last week due to the death of her neighbor. On 02/07/23, she reports that she had a racing HR. Her son at bedside says that she has been having waxing/waning speech changes over last few days.  Speech was described as being slurred.  It has been improving over the last 2 days but is still not back to baseline. CTH showed a left thalamic infarct likely remote (she reports right arm numbness for "a long time"). MRI ordered to assess for acute changes that would relate to her new speech changes. She currently has some mild dysarthria on exam, NIHSS 2 (scored 1 for the chronic numbness in right arm + 1 for dysarthria).  Patient reports history of hypertension but not taking medications because of lack of insurance. Neurology was consulted for stroke work up. No acute stroke treatments offered due to several days outside window and low NIH stroke scale.  Last seen normal-7 days ago NIH stroke scale-2 TPA given: Known-outside the window.  Stroke Risks: obesity, HTN, HLD, OSA, prev stroke   SUBJECTIVE (INTERVAL HISTORY) Back to baseline, no complaints .patientdenies prior history of A. Fib but as per cardiology review of her chart it has been documented in the past hence no need for checking TEE and loop recorder. Patient agreeable to being started on anticoagulation with eliquis. I have discussed risk benefits with the patient and answered questions    OBJECTIVE Vitals:   06/14/18 0725 06/14/18 1050 06/14/18 1055 06/14/18 1200  BP: 111/89   (!) 143/75  Pulse: 75 (!) 117 93 90  Resp: 15 (!) 27 19 (!) 28  Temp: 97.9 F (36.6 C)   97.8 F (36.6 C)  TempSrc: Oral   Oral  SpO2: 94% 94% 97% 99%  Weight:      Height:         CBC:  Recent Labs  Lab 06/12/18 0954 06/13/18 0246  WBC 6.3 6.6  NEUTROABS 3.1  --   HGB 14.1 12.6  HCT 41.0 38.4  MCV 93.2 95.8  PLT 269 099    Basic Metabolic Panel:  Recent Labs  Lab 06/12/18 0954 06/13/18 0246  NA 138 141  K 3.4* 4.2  CL 103 107  CO2 22 25  GLUCOSE 192* 121*  BUN 22 20  CREATININE 0.86 0.82  CALCIUM 9.7 9.1  MG  --  1.9    Lipid Panel:     Component Value Date/Time   CHOL 228 (H) 06/13/2018 0246   CHOL 190 08/26/2016 0855   TRIG 308 (H) 06/13/2018 0246   HDL 46 06/13/2018 0246   HDL 61 08/26/2016 0855   CHOLHDL 5.0 06/13/2018 0246   VLDL 62 (H) 06/13/2018 0246   LDLCALC 120 (H) 06/13/2018 0246   LDLCALC 100 (H) 08/26/2016 0855   HgbA1c:  Lab Results  Component Value Date   HGBA1C 6.8 (H) 06/13/2018   Urine Drug Screen:     Component Value Date/Time   LABOPIA NONE DETECTED 06/13/2018 1136   COCAINSCRNUR NONE DETECTED 06/13/2018 1136   LABBENZ NONE DETECTED 06/13/2018 1136   AMPHETMU NONE DETECTED 06/13/2018 1136   THCU NONE DETECTED 06/13/2018 1136   LABBARB NONE DETECTED 06/13/2018 1136    Alcohol Level No  results found for: ETH  IMAGING   Ct Angio Head W Or Wo Contrast Ct Angio Neck W Or Wo Contrast 06/12/2018 IMPRESSION:  1. Negative for emergent large vessel occlusion.  2. Highly diminutive right vertebral artery which terminates in the upper neck. Dominant left vertebral artery which supplies the basilar without stenosis, and otherwise negative posterior circulation.  3. Minimal carotid atherosclerosis.  Negative anterior circulation.    Dg Chest 2 View 06/12/2018 IMPRESSION:  Borderline cardiomegaly, without acute disease.    Ct Head Wo Contrast 06/12/2018 IMPRESSION:  1. LEFT thalamic infarct which appears more remote but correlate clinically.  2. Mild probable chronic small-vessel white matter ischemic changes.     Mr Brain Wo Contrast 06/13/2018 IMPRESSION:  Negative for acute infarct. Chronic  infarct in the left thalamus. Chronic ischemia in the deep white matter bilaterally.     Transthoracic Echocardiogram -Left ventricle: The cavity size was normal. There was mild   concentric hypertrophy. Systolic function was normal. The   estimated ejection fraction was in the range of 60% to 65%. Wall   motion was normal; there were no regional wall motion   abnormalities    PHYSICAL EXAM Blood pressure (!) 143/75, pulse 90, temperature 97.8 F (36.6 C), temperature source Oral, resp. rate (!) 28, height 5\' 4"  (1.626 m), weight 115.2 kg, SpO2 99 %.   Pleasant obese middle-age Caucasian lady. Afebrile. Head is nontraumatic. Neck is supple without bruit.    Cardiac exam no murmur or gallop. Lungs are clear to auscultation. Distal pulses are well felt.  Neurological Exam ;  Awake  Alert oriented x 3. Normal speech and language.eye movements full without nystagmus.fundi were not visualized. Vision acuity and fields appear normal. Hearing is normal. Palatal movements are normal. Face symmetric. Tongue midline. Normal strength, tone, reflexes and coordination. Normal sensation. Gait deferred.   HOME MEDICATIONS:  No medications prior to admission.      HOSPITAL MEDICATIONS:  . aspirin  300 mg Rectal Daily   Or  . aspirin  325 mg Oral Daily  . atorvastatin  40 mg Oral q1800  . enoxaparin (LOVENOX) injection  40 mg Subcutaneous Q24H  . insulin aspart  0-9 Units Subcutaneous TID WC  . Melatonin  3 mg Oral QHS  . mirtazapine  15 mg Oral QHS    ALLERGIES Allergies  Allergen Reactions  . Codeine Rash    PAST MEDICAL HISTORY Past Medical History:  Diagnosis Date  . Arthritis    osteoarthritis  . Depression    pt taking prozac  . Headache   . HLD (hyperlipidemia) 08/26/2016  . Hypertension   . Sleep apnea    pt is supposed to be on CPAP    SURGICAL HISTORY Past Surgical History:  Procedure Laterality Date  . ABDOMINAL HYSTERECTOMY    . CHOLECYSTECTOMY    . HERNIA  REPAIR    . WRIST SURGERY Bilateral    Carpal Tunnel      ASSESSMENT/PLAN Diana Mathews is a 67 y.o. female with history of OSA, HTN, HLD, HA, arthritis and depression presenting with speech difficulties. She did not receive IV t-PA due to late presentation.  Probable TIA:    Afib vs modifiable stroke risk factors   CT head - LEFT thalamic infarct which appears more remote but correlate clinically.   MRI head - Negative for acute infarct. Chronic infarct in the left thalamus  MRA head - not performed  CTA H&N - Highly diminutive right vertebral artery which  terminates in the upper neck.   Carotid Doppler - CTA neck performed - carotid dopplers not indicated.  2D Echo -normal ejection fraction. No clot  LDL - 120  HgbA1c - 6.8  UDS - negativeVTE prophylaxis - Lovenox  Diet Heart healthy with thin liquids.  aspirin 81 mg daily prior to admission, now on aspirin 325 mg daily  Patient counseled to be compliant with her antithrombotic medications  Ongoing aggressive stroke risk factor management  Therapy recommendations:  No f/u therapies recommended Disposition:  home Hypertension  Stable . Permissive hypertension (OK if < 220/120) but gradually normalize in 5-7 days . Long-term BP goal normotensive  Hyperlipidemia  Lipid lowering medication PTA:  none  LDL 120, goal < 70  Current lipid lowering medication: will add Lipitor 40 mg daily  Continue statin at discharge   Diabetes  HgbA1c 6.8, goal < 7.0  Controlled    Other Stroke Risk Factors  Advanced age  Obesity, Body mass index is 43.6 kg/m., recommend weight loss, diet and exercise as appropriate   Hx stroke/TIA by imaging  Obstructive sleep apnea,  CPAP at home  Atrial Fib - not anticoagulated  Other Active Problems  Newly diagnosed afib - if confirmed pt will need anticoagulation.   CHA2DS2-VASc Score  (0=low risk) (1=moderate risk)  (?2 = high risk anticoagulation  recommended) Age in Years:(<65 0) (65-75 +1) (75 or > +2)  Sex: (Female 0)  (Female +1) Hypertension History: yes +1   Diabetes Mellitus: yes +1 ? Congestive Heart Failure History: yes +1 Vascular Disease History: yes +1  Stroke/TIA/Thromboembolism History: yes +2   I have personally examined this patient, reviewed notes, independently viewed imaging studies, participated in medical decision making and plan of care.ROS completed by me personally and pertinent positives fully documented  I have made any additions or clarifications directly to the above note. Recommend start eliquis for long-term anticoagulation. I have discussed risk benefits with the patient and answered questions. Discharged on today. Follow-up as an outpatient in stroke clinic in 6 weeks. Long discussion with the patient and Dr. Glennis Brink and answered questions. Greater than 50% time during this 25 minute visit was spent on counseling and coordination of care about a TIA and atrial fibrillation and answering questions  Antony Contras, MD Medical Director [facility] Pager: [phone] 06/14/2018 4:54 PM   Hospital day # 1  To contact Stroke Continuity provider, please refer to [email or web address] After hours, contact General Neurology
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
## 2018-06-14 NOTE — Care Management (Signed)
06-14-18 BENEFITS CHECK :  #  3.    S/W CHRIS @ [place] /  [facility]  # [phone]              ELIQUIS  5 MG BID  PATIENT HAS A  : DISCOUNT CARD ( COUPON )  PREFERRED PHARMACY : [facility]

## 2018-06-14 NOTE — Progress Notes (Signed)
Chart reviewed. Pt with documented AF, no need for ILR. Dr Curt Bears discussed with Dr Verlon Au.   Chanetta Marshall, NP 06/14/2018 9:06 AM

## 2018-06-14 NOTE — Progress Notes (Signed)
Patient given d/c instructions, no printed prescriptions or equipment to give. Questions answered.  IV removed. Patient taken to car via wheelchair.  Continue to monitor patient.

## 2018-06-21 ENCOUNTER — Encounter: Payer: Self-pay | Admitting: Family Medicine

## 2018-06-21 ENCOUNTER — Ambulatory Visit (INDEPENDENT_AMBULATORY_CARE_PROVIDER_SITE_OTHER): Payer: PRIVATE HEALTH INSURANCE | Admitting: Family Medicine

## 2018-06-21 VITALS — BP 159/78 | HR 85 | Temp 98.3°F | Ht 64.0 in | Wt 259.1 lb

## 2018-06-21 DIAGNOSIS — I6381 Other cerebral infarction due to occlusion or stenosis of small artery: Secondary | ICD-10-CM

## 2018-06-21 DIAGNOSIS — I4891 Unspecified atrial fibrillation: Secondary | ICD-10-CM | POA: Diagnosis not present

## 2018-06-21 DIAGNOSIS — I6930 Unspecified sequelae of cerebral infarction: Secondary | ICD-10-CM

## 2018-06-21 DIAGNOSIS — G253 Myoclonus: Secondary | ICD-10-CM

## 2018-06-21 DIAGNOSIS — Z09 Encounter for follow-up examination after completed treatment for conditions other than malignant neoplasm: Secondary | ICD-10-CM | POA: Diagnosis not present

## 2018-06-21 DIAGNOSIS — E118 Type 2 diabetes mellitus with unspecified complications: Secondary | ICD-10-CM

## 2018-06-21 DIAGNOSIS — G4701 Insomnia due to medical condition: Secondary | ICD-10-CM

## 2018-06-21 DIAGNOSIS — R531 Weakness: Secondary | ICD-10-CM | POA: Diagnosis not present

## 2018-06-21 MED ORDER — ARIPIPRAZOLE 5 MG PO TABS: 5.0000 mg | ORAL_TABLET | Freq: Every day | ORAL | 2 refills | Status: DC

## 2018-06-21 MED ORDER — METFORMIN HCL ER (MOD) 500 MG PO TB24: 500.0000 mg | ORAL_TABLET | Freq: Two times a day (BID) | ORAL | 0 refills | Status: DC

## 2018-06-21 MED ORDER — DILTIAZEM HCL ER COATED BEADS 240 MG PO CP24: 240.0000 mg | ORAL_CAPSULE | Freq: Every day | ORAL | 1 refills | Status: DC

## 2018-06-21 MED ORDER — NABUMETONE 500 MG PO TABS: 500.0000 mg | ORAL_TABLET | Freq: Two times a day (BID) | ORAL | 2 refills | Status: DC

## 2018-06-21 NOTE — Patient Instructions (Signed)
Ok to take tylenol

## 2018-06-21 NOTE — Progress Notes (Signed)
Subjective:  Patient ID: Diana Mathews, female    DOB: [birth year]  Age: 67 y.o. MRN: [id number]  CC: Hospitalization Follow-up (pt here today for f/u after being admitted to the hospital for "stroke")   HPI Diana Mathews presents for recheck after hospitalization last week for apparent stroke.  She had an MRI of the brain showing previous lacunar infarcts and she has had some right-sided weakness reported noted in the office with the same time as a headache.  Additionally she has had elevated blood pressure since she is been off of her medication after losing her job.  When she was last seen in the office she was noted to have atrial fibrillation with rapid ventricular response as well markedly elevated blood pressure.  She was transported to [facility] and subsequently transferred to [facility].  Patient is now complaining of right lower extremity pain as well as bilateral lower extremity weakness.  She denies any chest pain or shortness of breath currently.  She does say that she remains sad.  She is even more confused and sad since hospitalization.  Of note is that she is currently taking Eliquis.  She has not been placed on a rate control medication as yet.  She was diagnosed with diabetes in the hospital.  She was on a sliding scale insulin but did not leave the hospital on a diabetic medication.  Nor was she trained in using a monitor etc.  Review of the MRI done in the hospital shows the previous lacunar infarcts but nothing acute.  Review of her office visits reveals that she had some suicidal thoughts but no action plan.  She does endorse depression.  PHQ score is noted below.  If anything she says it is worse now.  She tells me that she just wants to sit around and cry all the time.  She is worried about being able to afford her medications as well as her living quarters.  This is because she may lose her job.  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   Depression screen [facility] 2/9 05/20/2018 04/09/2017 12/19/2016  Decreased Interest 1 2 0  Down, Depressed, Hopeless 3 2 0  PHQ - 2 Score 4 4 0  Altered sleeping 3 3 -  Tired, decreased energy 1 2 -  Change in appetite 1 1 -  Feeling bad or failure about yourself  2 1 -  Trouble concentrating 1 0 -  Moving slowly or fidgety/restless 0 0 -  Suicidal thoughts 3 0 -  PHQ-9 Score 15 11 -  Difficult doing work/chores - Not difficult at all -    Depression screen [facility] 2/9 05/20/2018 04/09/2017 12/19/2016  Decreased Interest 1 2 0  Down, Depressed, Hopeless 3 2 0  PHQ - 2 Score 4 4 0  Altered sleeping 3 3 -  Tired, decreased energy 1 2 -  Change in appetite 1 1 -  Feeling bad or failure about yourself  2 1 -  Trouble concentrating 1 0 -  Moving slowly or fidgety/restless 0 0 -  Suicidal thoughts 3 0 -  PHQ-9 Score 15 11 -  Difficult doing work/chores - Not difficult at all -    History Diana Mathews has a past medical history of Arthritis, Depression, Headache, HLD (hyperlipidemia) (08/26/2016), Hypertension, and Sleep apnea.   She has a past surgical history that includes Abdominal hysterectomy; Wrist surgery (Bilateral);  Hernia repair; and Cholecystectomy.   Her family history includes Arthritis in her mother, sister, and sister; COPD in her mother; Diabetes in her brother, brother, mother, sister, and sister; Hypertension in her mother; Mental illness in her brother.She reports that she has never smoked. She has never used smokeless tobacco. She reports that she does not drink alcohol or use drugs.  Current Outpatient Medications on File Prior to Visit  Medication Sig Dispense Refill  . apixaban (ELIQUIS) 5 MG TABS tablet Take 1 tablet (5 mg total) by mouth 2 (two) times daily. 60 tablet 11  . atorvastatin (LIPITOR) 80 MG tablet Take 1 tablet (80 mg total) by mouth daily. 30 tablet 11  . cyclobenzaprine (FLEXERIL) 10 MG tablet Take 1 tablet (10 mg total) by  mouth at bedtime. 20 tablet 0  . diphenhydrAMINE (BENADRYL) 25 mg capsule Take 25 mg by mouth at bedtime as needed.    . Melatonin 10 MG CAPS Take 20 mg by mouth at bedtime.    . mirtazapine (REMERON) 15 MG tablet Take 1 tablet (15 mg total) by mouth at bedtime. 30 tablet 3  . mupirocin ointment (BACTROBAN) 2 % Apply 1 application topically 2 (two) times daily. 22 g 0   No current facility-administered medications on file prior to visit.      ROS Review of Systems  Constitutional: Positive for activity change (Decreased due to weakness) and fatigue. Negative for chills and fever.  HENT: Negative for congestion.   Eyes: Negative for visual disturbance.  Respiratory: Negative for shortness of breath.   Cardiovascular: Negative for chest pain and palpitations.  Gastrointestinal: Negative for abdominal pain, constipation, diarrhea, nausea and vomiting.  Genitourinary: Negative for difficulty urinating.  Musculoskeletal: Positive for arthralgias. Myalgias: Cramping in right leg.  Throbs at night reaching 8/10 and interfering with sleep.  Neurological: Negative for headaches.  Psychiatric/Behavioral: Positive for confusion. Negative for sleep disturbance.    Objective:  BP (!) 159/78   Pulse 85   Temp 98.3 F (36.8 C) (Oral)   Ht 5' 4"  (1.626 m)   Wt 259 lb 2 oz (117.5 kg)   BMI 44.48 kg/m   BP Readings from Last 3 Encounters:  06/21/18 (!) 159/78  06/14/18 (!) 143/75  06/12/18 (!) 161/86    Wt Readings from Last 3 Encounters:  06/21/18 259 lb 2 oz (117.5 kg)  06/12/18 254 lb (115.2 kg)  06/12/18 254 lb 8 oz (115.4 kg)     Physical Exam  Constitutional: She is oriented to person, place, and time. She appears well-developed and well-nourished. She appears distressed (Looks tired and weak).  HENT:  Head: Normocephalic and atraumatic.  Right Ear: External ear normal.  Left Ear: External ear normal.  Nose: Nose normal.  Mouth/Throat: Oropharynx is clear and moist.  Eyes:  Pupils are equal, round, and reactive to light. Conjunctivae and EOM are normal.  Neck: Normal range of motion. Neck supple. No thyromegaly present.  Cardiovascular: Normal rate, regular rhythm and normal heart sounds.  No murmur heard. Pulmonary/Chest: Effort normal and breath sounds normal. No respiratory distress. She has no wheezes. She has no rales.  Abdominal: Soft. Bowel sounds are normal. She exhibits no distension. There is no tenderness.  Lymphadenopathy:    She has no cervical adenopathy.  Neurological: She is alert and oriented to person, place, and time. She has normal reflexes.  Skin: Skin is warm and dry.  Psychiatric: Thought content normal. Her mood appears anxious. Her affect is blunt. Her speech is delayed.  She is slowed and withdrawn. Cognition and memory are impaired. She exhibits a depressed mood.      Assessment & Plan:   Anyae was seen today for hospitalization follow-up.  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 -     ARIPiprazole (ABILIFY) 5 MG tablet; Take 1 tablet (5 mg total) by mouth daily. -     nabumetone (RELAFEN) 500 MG tablet; Take 1-2 tablets (500-1,000 mg total) by mouth 2 (two) times daily. -     diltiazem (CARDIZEM CD) 240 MG 24 hr capsule; Take 1 capsule (240 mg total) by mouth daily. For blood pressure and heart rate  control.    Patient's pulse is currently controlled therefore will focus on treating high blood pressure and introduce a rate control medicine at the same time.  She will also need treatment for depression and pain relief as well as starting treatment for diabetes.    I have discontinued Celester Morgan. [place]'s naproxen. I am also having her start on metFORMIN, ARIPiprazole, nabumetone, and diltiazem. Additionally, I am having her maintain her Melatonin, mirtazapine, cyclobenzaprine, mupirocin ointment, diphenhydrAMINE, apixaban, and atorvastatin.  Allergies as of 06/21/2018      Reactions   Codeine Rash      Medication List        Accurate as of 06/21/18  2:14 PM. Always use your most recent med list.          apixaban 5 MG Tabs tablet Commonly known as:  ELIQUIS Take 1 tablet (5 mg total) by mouth 2 (two) times daily.   ARIPiprazole 5 MG tablet Commonly known as:  ABILIFY Take 1 tablet (5 mg total) by mouth daily.   atorvastatin 80 MG tablet Commonly known as:  LIPITOR Take 1 tablet (80 mg total) by mouth daily.   cyclobenzaprine 10 MG tablet Commonly known as:  FLEXERIL Take 1 tablet (10 mg total) by mouth at bedtime.   diltiazem 240 MG 24 hr capsule Commonly known as:  CARDIZEM CD Take 1 capsule (240 mg total) by mouth daily. For blood pressure and heart rate control.   diphenhydrAMINE 25 mg capsule Commonly known as:  BENADRYL Take 25 mg by mouth at bedtime as needed.   Melatonin 10 MG Caps Take 20 mg by mouth at bedtime.   metFORMIN 500 MG (MOD) 24 hr tablet Commonly known as:  GLUMETZA Take 1 tablet (500 mg total) by mouth 2 (two) times daily with a meal.   mirtazapine 15 MG tablet Commonly known as:  REMERON Take 1 tablet (15 mg total) by mouth at bedtime.   mupirocin ointment 2 % Commonly known as:  BACTROBAN Apply 1 application topically 2 (two) times daily.   nabumetone 500 MG tablet Commonly known as:  RELAFEN Take 1-2 tablets (500-1,000 mg total)  by mouth 2 (two) times daily.      Patient may be permanently disabled.  She certainly needs a handicap sticker which was completed for her along with patient assistance program for her Eliquis.  Multiple medications were started which should help control her heart rate, her blood pressure, her depression her diabetic blood sugar, as well as relieving her arthritis related pain.  The Eliquis of course is meant to help prevent further stroke.  Follow-up: Return in about 2 weeks (around 07/05/2018).  Claretta Fraise, M.D.

## 2018-06-22 ENCOUNTER — Other Ambulatory Visit: Payer: Self-pay

## 2018-06-22 ENCOUNTER — Other Ambulatory Visit: Payer: Self-pay | Admitting: *Deleted

## 2018-06-22 LAB — CMP14+EGFR
ALT: 23 IU/L (ref 0–32)
AST: 17 IU/L (ref 0–40)
Albumin/Globulin Ratio: 1.4 (ref 1.2–2.2)
Albumin: 4.4 g/dL (ref 3.6–4.8)
Alkaline Phosphatase: 83 IU/L (ref 39–117)
BILIRUBIN TOTAL: 0.3 mg/dL (ref 0.0–1.2)
BUN / CREAT RATIO: 27 (ref 12–28)
BUN: 19 mg/dL (ref 8–27)
CO2: 22 mmol/L (ref 20–29)
Calcium: 9.8 mg/dL (ref 8.7–10.3)
Chloride: 104 mmol/L (ref 96–106)
Creatinine, Ser: 0.71 mg/dL (ref 0.57–1.00)
GFR calc Af Amer: 104 mL/min/{1.73_m2} (ref >59)
GFR calc non Af Amer: 90 mL/min/{1.73_m2} (ref >59)
GLUCOSE: 123 mg/dL — AB (ref 65–99)
Globulin, Total: 3.1 g/dL (ref 1.5–4.5)
Potassium: 4.3 mmol/L (ref 3.5–5.2)
Sodium: 142 mmol/L (ref 134–144)
Total Protein: 7.5 g/dL (ref 6.0–8.5)

## 2018-06-22 LAB — CBC WITH DIFFERENTIAL/PLATELET
BASOS ABS: 0 10*3/uL (ref 0.0–0.2)
Basos: 0 %
EOS (ABSOLUTE): 0.1 10*3/uL (ref 0.0–0.4)
Eos: 1 %
Hematocrit: 39.4 % (ref 34.0–46.6)
Hemoglobin: 12.8 g/dL (ref 11.1–15.9)
Immature Grans (Abs): 0 10*3/uL (ref 0.0–0.1)
Immature Granulocytes: 0 %
LYMPHS ABS: 4 10*3/uL — AB (ref 0.7–3.1)
Lymphs: 39 %
MCH: 30.5 pg (ref 26.6–33.0)
MCHC: 32.5 g/dL (ref 31.5–35.7)
MCV: 94 fL (ref 79–97)
Monocytes Absolute: 0.7 10*3/uL (ref 0.1–0.9)
Monocytes: 7 %
NEUTROS ABS: 5.4 10*3/uL (ref 1.4–7.0)
Neutrophils: 53 %
Platelets: 279 10*3/uL (ref 150–450)
RBC: 4.19 x10E6/uL (ref 3.77–5.28)
RDW: 13.9 % (ref 12.3–15.4)
WBC: 10.2 10*3/uL (ref 3.4–10.8)

## 2018-06-22 LAB — VITAMIN D 25 HYDROXY (VIT D DEFICIENCY, FRACTURES): VIT D 25 HYDROXY: 15 ng/mL — AB (ref 30.0–100.0)

## 2018-06-22 LAB — TSH: TSH: 0.713 u[IU]/mL (ref 0.450–4.500)

## 2018-06-22 MED ORDER — VITAMIN D (ERGOCALCIFEROL) 1.25 MG (50000 UNIT) PO CAPS: 50000.0000 [IU] | ORAL_CAPSULE | ORAL | 1 refills | Status: DC

## 2018-06-22 MED ORDER — CYCLOBENZAPRINE HCL 10 MG PO TABS: 10.0000 mg | ORAL_TABLET | Freq: Every day | ORAL | 0 refills | Status: DC

## 2018-06-30 ENCOUNTER — Encounter: Payer: Self-pay | Admitting: Physical Therapy

## 2018-06-30 ENCOUNTER — Other Ambulatory Visit: Payer: Self-pay

## 2018-06-30 ENCOUNTER — Ambulatory Visit: Payer: PRIVATE HEALTH INSURANCE | Attending: Family Medicine | Admitting: Physical Therapy

## 2018-06-30 DIAGNOSIS — M6281 Muscle weakness (generalized): Secondary | ICD-10-CM | POA: Insufficient documentation

## 2018-06-30 DIAGNOSIS — R2689 Other abnormalities of gait and mobility: Secondary | ICD-10-CM | POA: Insufficient documentation

## 2018-06-30 NOTE — Therapy (Addendum)
[facility] [place], [place], [address] Phone: [phone]   Fax:  [phone]  Physical Therapy Evaluation/Discharge PHYSICAL THERAPY DISCHARGE SUMMARY  Visits from Start of Care: 1  Current functional level related to goals / functional outcomes: See below   Remaining deficits: Goal met   Education / Equipment: HEP Plan: Patient agrees to discharge.  Patient goals were met. Patient is being discharged due to financial reasons.  ?????    IE with HEP only due to finances.  Gabriela Eves, PT, DPT   Patient Details  Name: Diana Mathews MRN: [id number] Date of Birth: 30-Jan-1951 Referring Provider: Claretta Fraise, MD   Encounter Date: 06/30/2018  PT End of Session - 06/30/18 0916    Visit Number  1    Number of Visits  1    PT Start Time  0816    PT Stop Time  0900    PT Time Calculation (min)  44 min    Activity Tolerance  Patient tolerated treatment well    Behavior During Therapy  [facility] for tasks assessed/performed       Past Medical History:  Diagnosis Date  . Arthritis    osteoarthritis  . Depression    pt taking prozac  . Headache   . HLD (hyperlipidemia) 08/26/2016  . Hypertension   . Sleep apnea    pt is supposed to be on CPAP    Past Surgical History:  Procedure Laterality Date  . ABDOMINAL HYSTERECTOMY    . CHOLECYSTECTOMY    . HERNIA REPAIR    . WRIST SURGERY Bilateral    Carpal Tunnel    There were no vitals filed for this visit.   Subjective Assessment - 06/30/18 1029    Subjective  Patient arrives to physical therapy with reports of bilateral LE weakness and pain secondary to a stroke on 06/12/18. Patient stated she went to her primary care physician to which they had sent her to [facility] due to an abnormal EKG. Patient reported from there, she was transferred to [facility]. Patient reports being able to perform all ADLs with increased time and rest. Patient reports  pain in bilateral LEs R>L with increased activity and increased standing time.  Patient reports pain can get to 0/10 with rest. Patient has family support from son at home and with her sister and nephew near her home. Patient's goals are to decrease pain, improve movement, improve standing time to greater than 5 minutes, and improve ability to perform all home activities.     Limitations  House hold activities;Walking;Standing    How long can you stand comfortably?  less than 5 minutes    How long can you walk comfortably?  limited to 200 feet per hospital MD    Diagnostic tests  MRI, CT scan    Patient Stated Goals  get stronger and decrease pain    Currently in Pain?  Yes    Pain Score  7     Pain Location  Leg    Pain Orientation  Right;Left   Right>Left   Pain Descriptors / Indicators  Aching;Sore;Tiring    Pain Type  Acute pain    Pain Onset  1 to 4 weeks ago    Pain Frequency  Intermittent    Aggravating Factors   standing and walking    Pain Relieving Factors  resting, lying down    Effect of Pain on Daily Activities  "can't do what I want to do."  [facility] PT Assessment - 06/30/18 0001      Assessment   Medical Diagnosis  Generalized weakness, lacunar infarct    Referring Provider  Claretta Fraise, MD    Onset Date/Surgical Date  06/12/18    Hand Dominance  Right    Next MD Visit  Sept 10, 2019    Prior Therapy  no      Precautions   Precautions  None      Restrictions   Weight Bearing Restrictions  No      Balance Screen   Has the patient fallen in the past 6 months  No    Has the patient had a decrease in activity level because of a fear of falling?   No    Is the patient reluctant to leave their home because of a fear of falling?   No      Home Environment   Living Environment  Private residence    Living Arrangements  Children    Available Help at Discharge  Family    Type of [place] to enter    Entrance Stairs-Number of  Steps  1    Entrance Stairs-Rails  None    Home Layout  One level      Prior Function   Level of Independence  Independent    Vocation  --   applying for disability     ROM / Strength   AROM / PROM / Strength  AROM;Strength      AROM   Overall AROM   Within functional limits for tasks performed    Overall AROM Comments  LE AROM WNL      Strength   Strength Assessment Site  Hip;Knee;Ankle    Right/Left Hip  Right;Left    Right Hip Flexion  4-/5    Left Hip Flexion  4/5    Right/Left Knee  Right;Left    Right Knee Flexion  3+/5    Right Knee Extension  3+/5    Left Knee Flexion  4-/5    Left Knee Extension  4+/5    Right/Left Ankle  Right;Left    Right Ankle Dorsiflexion  4/5    Right Ankle Inversion  3+/5    Right Ankle Eversion  3+/5    Left Ankle Dorsiflexion  4/5    Left Ankle Inversion  3+/5    Left Ankle Eversion  3+/5      Balance   Balance Assessed  Yes      Standardized Balance Assessment   Standardized Balance Assessment  Berg Balance Test      Berg Balance Test   Sit to Stand  Able to stand  independently using hands    Standing Unsupported  Able to stand 30 seconds unsupported   (+) pain in legs   Sitting with Back Unsupported but Feet Supported on Floor or Stool  Able to sit 2 minutes under supervision    Stand to Sit  Controls descent by using hands    Transfers  Able to transfer safely, definite need of hands    Berg comment:  incomplete testing                Objective measurements completed on examination: See above findings.      [place] Adult PT Treatment/Exercise - 06/30/18 0001      Transfers   Transfers  Sit to Stand;Stand to Sit    Sit to Stand  5: Supervision  Stand to Sit  5: Supervision      Ambulation/Gait   Gait Pattern  Decreased step length - left;Decreased stance time - right;Wide base of support;Trendelenburg;Step-through pattern;Decreased arm swing - right;Decreased weight shift to right    Ambulation Surface   Level;Indoor      Exercises   Exercises  --             PT Education - 06/30/18 1042    Education Details  sit to stands, LAQ, hip abd, hip add, marching    Person(s) Educated  Patient    Methods  Explanation;Demonstration;Handout    Comprehension  Returned demonstration;Verbalized understanding          PT Long Term Goals - 06/30/18 0918      PT LONG TERM GOAL #1   Title  Patient will be independent with HEP    Time  1    Period  Days    Status  New             Plan - 06/30/18 1043    Clinical Impression Statement  Patient is a 67 year old female who presents to physical therapy with bilateral LE weakness R>L secondary to a stroke on 06/12/18. Patient requires UE support for transitions from sit to stand. Patient ambulates with decreased left step length, decreased stance time, and wide base of support. During evaluation, patient was informed physical therapy is not covered under her insurance. Patient and PT discussed terminated evaluation and emphasizing exercises that can be performed at home. Patient in agreement. HEP print out was provided. Patient was able to perform exercises with good form after initial demonstration and explanation. Patient instructed to perform standing exercises with her son home for safety. Patient reported understanding. Patient discharged with HEP at this time secondary to lack of insurance and lack of finances to pay for self-pay rate. Patient instructed to call if any question arise.     History and Personal Factors relevant to plan of care:  06/12/18 stroke; HTN, DM    Clinical Presentation  Stable    Clinical Decision Making  Moderate    Rehab Potential  Good    PT Frequency  One time visit    PT Treatment/Interventions  ADLs/Self Care Home Management;Functional mobility training;Stair training;Gait training;Balance training;Therapeutic exercise;Therapeutic activities;Neuromuscular re-education    PT Next Visit Plan  DC    PT Home  Exercise Plan  see patient education sheet    Consulted and Agree with Plan of Care  Patient       Patient will benefit from skilled therapeutic intervention in order to improve the following deficits and impairments:  Pain, Decreased activity tolerance, Decreased endurance, Decreased strength, Difficulty walking, Decreased balance  Visit Diagnosis: Muscle weakness (generalized)  Other abnormalities of gait and mobility     Problem List Patient Active Problem List   Diagnosis Date Noted  . Fibrillation, atrial ([place]) 06/13/2018  . Dysarthria 06/12/2018  . New onset atrial fibrillation ([place]) 06/12/2018  . Hyperglycemia 06/12/2018  . Obesity, Class III, BMI 40-49.9 (morbid obesity) ([place]) 06/12/2018  . Atrial fibrillation with rapid ventricular response ([place])   . Palpitations   . Slurred speech   . HLD (hyperlipidemia) 08/26/2016  . HTN (hypertension) 04/15/2016  . Mood disorder ([place]) 04/15/2016  . Insomnia 04/15/2016    Gabriela Eves, PT, DPT 06/30/2018, 10:48 AM  [facility] [address] [place], [place], [address] Phone: [phone]   Fax:  [phone]  Name: Diana Mathews MRN: [id number]  Date of Birth: 1951-03-23

## 2018-07-05 ENCOUNTER — Other Ambulatory Visit: Payer: Self-pay | Admitting: Family Medicine

## 2018-07-06 ENCOUNTER — Ambulatory Visit (INDEPENDENT_AMBULATORY_CARE_PROVIDER_SITE_OTHER): Payer: PRIVATE HEALTH INSURANCE | Admitting: Family Medicine

## 2018-07-06 ENCOUNTER — Encounter: Payer: Self-pay | Admitting: Family Medicine

## 2018-07-06 VITALS — BP 157/72 | HR 76 | Temp 98.0°F | Ht 64.0 in | Wt 253.1 lb

## 2018-07-06 DIAGNOSIS — E118 Type 2 diabetes mellitus with unspecified complications: Secondary | ICD-10-CM | POA: Diagnosis not present

## 2018-07-06 DIAGNOSIS — I482 Chronic atrial fibrillation, unspecified: Secondary | ICD-10-CM

## 2018-07-06 DIAGNOSIS — G44209 Tension-type headache, unspecified, not intractable: Secondary | ICD-10-CM

## 2018-07-06 DIAGNOSIS — I10 Essential (primary) hypertension: Secondary | ICD-10-CM | POA: Diagnosis not present

## 2018-07-06 LAB — GLUCOSE HEMOCUE WAIVED: GLU HEMOCUE WAIVED: 117 mg/dL — AB (ref 65–99)

## 2018-07-06 MED ORDER — DILTIAZEM HCL ER COATED BEADS 240 MG PO CP24: 240.0000 mg | ORAL_CAPSULE | Freq: Two times a day (BID) | ORAL | 1 refills | Status: DC

## 2018-07-06 MED ORDER — BLOOD GLUCOSE MONITOR SYSTEM W/DEVICE KIT: 1.0000 | PACK | Freq: Two times a day (BID) | 0 refills | Status: DC

## 2018-07-06 NOTE — Progress Notes (Signed)
Subjective:  Patient ID: Diana Mathews, female    DOB: 05-30-1951  Age: 67 y.o. MRN: [id number]  CC: Follow-up (pt here today for 2 week f/u )   HPI LYZBETH GENRICH presents for recheck of her recent stroke.  She was also diagnosed with atrial fibrillation with rapid ventricular response and was started on Eliquis.  Her insurance will not cover the Eliquis nor will it cover physical therapy for her right lower extremity which is cramping at night and still somewhat weak.  Additionally she has not been able to get a glucose monitor due to lack of insurance coverage.  She says her son will pay for strips for her though.  She denies any bleeding episodes from the use of Eliquis.  She is having some increased headaches.  Depression screen [facility] 2/9 07/06/2018 05/20/2018 04/09/2017  Decreased Interest 3 1 2   Down, Depressed, Hopeless 3 3 2   PHQ - 2 Score 6 4 4   Altered sleeping 3 3 3   Tired, decreased energy 3 1 2   Change in appetite 2 1 1   Feeling bad or failure about yourself  3 2 1   Trouble concentrating 3 1 0  Moving slowly or fidgety/restless 1 0 0  Suicidal thoughts 2 3 0  PHQ-9 Score 23 15 11   Difficult doing work/chores - - Not difficult at all  Some recent data might be hidden    History Aasiyah has a past medical history of Arthritis, Controlled type 2 diabetes mellitus with complication, without long-term current use of insulin ([place]) (07/06/2018), Depression, Headache, HLD (hyperlipidemia) (08/26/2016), Hypertension, and Sleep apnea.   She has a past surgical history that includes Abdominal hysterectomy; Wrist surgery (Bilateral); Hernia repair; and Cholecystectomy.   Her family history includes Arthritis in her mother, sister, and sister; COPD in her mother; Diabetes in her brother, brother, mother, sister, and sister; Hypertension in her mother; Mental illness in her brother.She reports that she has never smoked. She has never used smokeless tobacco. She reports that she does not  drink alcohol or use drugs.    ROS Review of Systems  Constitutional: Negative.   HENT: Negative for congestion.   Eyes: Negative for visual disturbance.  Respiratory: Negative for shortness of breath.   Cardiovascular: Negative for chest pain.  Gastrointestinal: Negative for abdominal pain, constipation, diarrhea, nausea and vomiting.  Genitourinary: Negative for difficulty urinating.  Musculoskeletal: Negative for arthralgias and myalgias.  Neurological: Negative for headaches.  Psychiatric/Behavioral: Negative for sleep disturbance.    Objective:  BP (!) 157/72   Pulse 76   Temp 98 F (36.7 C) (Oral)   Ht 5\' 4"  (1.626 m)   Wt 253 lb 2 oz (114.8 kg)   BMI 43.45 kg/m   BP Readings from Last 3 Encounters:  07/06/18 (!) 157/72  06/21/18 (!) 159/78  06/14/18 (!) 143/75    Wt Readings from Last 3 Encounters:  07/06/18 253 lb 2 oz (114.8 kg)  06/21/18 259 lb 2 oz (117.5 kg)  06/12/18 254 lb (115.2 kg)     Physical Exam  Constitutional: She is oriented to person, place, and time. She appears well-developed and well-nourished. No distress.  HENT:  Head: Normocephalic and atraumatic.  Right Ear: External ear normal.  Left Ear: External ear normal.  Nose: Nose normal.  Mouth/Throat: Oropharynx is clear and moist.  Eyes: Pupils are equal, round, and reactive to light. Conjunctivae and EOM are normal.  Neck: Normal range of motion. Neck supple. No thyromegaly present.  Cardiovascular: Normal  rate, regular rhythm and normal heart sounds.  No murmur heard. Pulmonary/Chest: Effort normal and breath sounds normal. No respiratory distress. She has no wheezes. She has no rales.  Abdominal: Soft. Bowel sounds are normal. She exhibits no distension. There is no tenderness.  Lymphadenopathy:    She has no cervical adenopathy.  Neurological: She is alert and oriented to person, place, and time. She has normal reflexes.  Skin: Skin is warm and dry.  Psychiatric: She has a normal  mood and affect. Her behavior is normal. Judgment and thought content normal.      Assessment & Plan:   Terea was seen today for follow-up.  Diagnoses and all orders for this visit:  Chronic atrial fibrillation ([place])  Controlled type 2 diabetes mellitus with complication, without long-term current use of insulin ([place])  Essential hypertension  Acute non intractable tension-type headache       I am having Osker Mason. Brier maintain her Melatonin, mirtazapine, mupirocin ointment, diphenhydrAMINE, apixaban, atorvastatin, metFORMIN, ARIPiprazole, nabumetone, diltiazem, cyclobenzaprine, and Vitamin D (Ergocalciferol).  Allergies as of 07/06/2018      Reactions   Codeine Rash      Medication List        Accurate as of 07/06/18  1:32 PM. Always use your most recent med list.          apixaban 5 MG Tabs tablet Commonly known as:  ELIQUIS Take 1 tablet (5 mg total) by mouth 2 (two) times daily.   ARIPiprazole 5 MG tablet Commonly known as:  ABILIFY Take 1 tablet (5 mg total) by mouth daily.   atorvastatin 80 MG tablet Commonly known as:  LIPITOR Take 1 tablet (80 mg total) by mouth daily.   cyclobenzaprine 10 MG tablet Commonly known as:  FLEXERIL Take 1 tablet (10 mg total) by mouth at bedtime.   diltiazem 240 MG 24 hr capsule Commonly known as:  CARDIZEM CD Take 1 capsule (240 mg total) by mouth daily. For blood pressure and heart rate control.   diphenhydrAMINE 25 mg capsule Commonly known as:  BENADRYL Take 25 mg by mouth at bedtime as needed.   Melatonin 10 MG Caps Take 20 mg by mouth at bedtime.   metFORMIN 500 MG (MOD) 24 hr tablet Commonly known as:  GLUMETZA Take 1 tablet (500 mg total) by mouth 2 (two) times daily with a meal.   mirtazapine 15 MG tablet Commonly known as:  REMERON Take 1 tablet (15 mg total) by mouth at bedtime.   mupirocin ointment 2 % Commonly known as:  BACTROBAN Apply 1 application topically 2 (two) times daily.     nabumetone 500 MG tablet Commonly known as:  RELAFEN Take 1-2 tablets (500-1,000 mg total) by mouth 2 (two) times daily.   Vitamin D (Ergocalciferol) 50000 units Caps capsule Commonly known as:  DRISDOL TAKE 1 CAPSULE EVERY 7 DAYS        Follow-up: Return in about 1 month (around 08/05/2018).  Claretta Fraise, M.D.

## 2018-07-06 NOTE — Addendum Note (Signed)
Addended by: Marylin Crosby on: 07/06/2018 01:46 PM   Modules accepted: Orders

## 2018-07-06 NOTE — Addendum Note (Signed)
Addended by: Marylin Crosby on: 07/06/2018 01:43 PM   Modules accepted: Orders

## 2018-07-20 ENCOUNTER — Other Ambulatory Visit: Payer: Self-pay | Admitting: Family Medicine

## 2018-07-26 ENCOUNTER — Other Ambulatory Visit: Payer: Self-pay | Admitting: Family Medicine

## 2018-07-27 ENCOUNTER — Encounter: Payer: Self-pay | Admitting: Family Medicine

## 2018-07-27 ENCOUNTER — Ambulatory Visit (INDEPENDENT_AMBULATORY_CARE_PROVIDER_SITE_OTHER): Payer: PRIVATE HEALTH INSURANCE | Admitting: Family Medicine

## 2018-07-27 VITALS — BP 146/80 | HR 80 | Temp 97.8°F | Ht 64.0 in | Wt 253.0 lb

## 2018-07-27 DIAGNOSIS — Z23 Encounter for immunization: Secondary | ICD-10-CM | POA: Diagnosis not present

## 2018-07-27 DIAGNOSIS — E118 Type 2 diabetes mellitus with unspecified complications: Secondary | ICD-10-CM | POA: Diagnosis not present

## 2018-07-27 LAB — URINALYSIS
BILIRUBIN UA: NEGATIVE
Glucose, UA: NEGATIVE
Leukocytes, UA: NEGATIVE
Nitrite, UA: NEGATIVE
Protein, UA: NEGATIVE
RBC, UA: NEGATIVE
Specific Gravity, UA: 1.02 (ref 1.005–1.030)
Urobilinogen, Ur: 0.2 mg/dL (ref 0.2–1.0)
pH, UA: 5.5 (ref 5.0–7.5)

## 2018-07-27 LAB — CBC WITH DIFFERENTIAL/PLATELET
Basophils Absolute: 0 10*3/uL (ref 0.0–0.2)
Basos: 1 %
EOS (ABSOLUTE): 0.1 10*3/uL (ref 0.0–0.4)
Eos: 2 %
Hematocrit: 38.7 % (ref 34.0–46.6)
Hemoglobin: 12.7 g/dL (ref 11.1–15.9)
Immature Grans (Abs): 0 10*3/uL (ref 0.0–0.1)
Immature Granulocytes: 0 %
Lymphocytes Absolute: 2.5 10*3/uL (ref 0.7–3.1)
Lymphs: 48 %
MCH: 30.1 pg (ref 26.6–33.0)
MCHC: 32.8 g/dL (ref 31.5–35.7)
MCV: 92 fL (ref 79–97)
Monocytes Absolute: 0.5 10*3/uL (ref 0.1–0.9)
Monocytes: 9 %
Neutrophils Absolute: 2.1 10*3/uL (ref 1.4–7.0)
Neutrophils: 40 %
Platelets: 246 10*3/uL (ref 150–450)
RBC: 4.22 x10E6/uL (ref 3.77–5.28)
RDW: 13.4 % (ref 12.3–15.4)
WBC: 5.2 10*3/uL (ref 3.4–10.8)

## 2018-07-27 LAB — CMP14+EGFR
A/G RATIO: 1.7 (ref 1.2–2.2)
ALT: 27 IU/L (ref 0–32)
AST: 26 IU/L (ref 0–40)
Albumin: 4.4 g/dL (ref 3.6–4.8)
Alkaline Phosphatase: 99 IU/L (ref 39–117)
BUN / CREAT RATIO: 14 (ref 12–28)
BUN: 11 mg/dL (ref 8–27)
Bilirubin Total: 0.3 mg/dL (ref 0.0–1.2)
CALCIUM: 9.4 mg/dL (ref 8.7–10.3)
CO2: 25 mmol/L (ref 20–29)
CREATININE: 0.76 mg/dL (ref 0.57–1.00)
Chloride: 101 mmol/L (ref 96–106)
GFR calc Af Amer: 96 mL/min/{1.73_m2} (ref >59)
GFR, EST NON AFRICAN AMERICAN: 83 mL/min/{1.73_m2} (ref >59)
GLUCOSE: 124 mg/dL — AB (ref 65–99)
Globulin, Total: 2.6 g/dL (ref 1.5–4.5)
POTASSIUM: 4.3 mmol/L (ref 3.5–5.2)
SODIUM: 143 mmol/L (ref 134–144)
TOTAL PROTEIN: 7 g/dL (ref 6.0–8.5)

## 2018-07-27 LAB — LIPID PANEL
Chol/HDL Ratio: 2.1 ratio (ref 0.0–4.4)
Cholesterol, Total: 107 mg/dL (ref 100–199)
HDL: 52 mg/dL (ref >39)
LDL Calculated: 34 mg/dL (ref 0–99)
Triglycerides: 106 mg/dL (ref 0–149)
VLDL Cholesterol Cal: 21 mg/dL (ref 5–40)

## 2018-07-27 LAB — BAYER DCA HB A1C WAIVED: HB A1C: 6.7 % (ref <7.0)

## 2018-07-27 MED ORDER — ARIPIPRAZOLE 5 MG PO TABS: 5.0000 mg | ORAL_TABLET | Freq: Every day | ORAL | 2 refills | Status: DC

## 2018-07-27 MED ORDER — DILTIAZEM HCL ER COATED BEADS 240 MG PO CP24: 240.0000 mg | ORAL_CAPSULE | Freq: Two times a day (BID) | ORAL | 1 refills | Status: DC

## 2018-07-27 MED ORDER — METFORMIN HCL ER 500 MG PO TB24: ORAL_TABLET | ORAL | 2 refills | Status: DC

## 2018-07-27 MED ORDER — MIRTAZAPINE 15 MG PO TABS: 15.0000 mg | ORAL_TABLET | Freq: Every day | ORAL | 3 refills | Status: DC

## 2018-07-27 MED ORDER — NABUMETONE 500 MG PO TABS: 500.0000 mg | ORAL_TABLET | Freq: Two times a day (BID) | ORAL | 2 refills | Status: DC

## 2018-07-27 NOTE — Progress Notes (Signed)
Subjective:  Patient ID: Diana Mathews, female    DOB: Mar 18, 1951  Age: 67 y.o. MRN: [id number]  CC: Medical Management of Chronic Issues (1 month)   HPI FLORENTINA MARQUART presents forFollow-up of diabetes. Patient checks blood sugar at home.  Readings running around 128 both fasting and postprandial. Patient denies symptoms such as polyuria, polydipsia, excessive hunger, nausea No significant hypoglycemic spells noted. Medications reviewed. Pt reports taking them regularly without complication/adverse reaction being reported today.    History Diana Mathews has a past medical history of Arthritis, Controlled type 2 diabetes mellitus with complication, without long-term current use of insulin ([place]) (07/06/2018), Depression, Headache, HLD (hyperlipidemia) (08/26/2016), Hypertension, and Sleep apnea.   She has a past surgical history that includes Abdominal hysterectomy; Wrist surgery (Bilateral); Hernia repair; and Cholecystectomy.   Her family history includes Arthritis in her mother, sister, and sister; COPD in her mother; Diabetes in her brother, brother, mother, sister, and sister; Hypertension in her mother; Mental illness in her brother.She reports that she has never smoked. She has never used smokeless tobacco. She reports that she does not drink alcohol or use drugs.  Current Outpatient Medications on File Prior to Visit  Medication Sig Dispense Refill  . apixaban (ELIQUIS) 5 MG TABS tablet Take 1 tablet (5 mg total) by mouth 2 (two) times daily. 60 tablet 11  . atorvastatin (LIPITOR) 80 MG tablet Take 1 tablet (80 mg total) by mouth daily. 30 tablet 11  . Blood Glucose Monitoring Suppl (BLOOD GLUCOSE MONITOR SYSTEM) w/Device KIT 1 Device by Does not apply route 2 (two) times daily. 1 each 0  . diphenhydrAMINE (BENADRYL) 25 mg capsule Take 25 mg by mouth at bedtime as needed.    . Melatonin 10 MG CAPS Take 20 mg by mouth at bedtime.    . mupirocin ointment (BACTROBAN) 2 % Apply 1  application topically 2 (two) times daily. 22 g 0  . ACCU-CHEK GUIDE test strip 4 TIMES DAILY 400 each 3   No current facility-administered medications on file prior to visit.     ROS Review of Systems  Constitutional: Negative.   HENT: Negative for congestion.   Eyes: Negative for visual disturbance.  Respiratory: Negative for shortness of breath.   Cardiovascular: Negative for chest pain.  Gastrointestinal: Negative for abdominal pain, constipation, diarrhea, nausea and vomiting.  Genitourinary: Negative for difficulty urinating.  Musculoskeletal: Negative for arthralgias and myalgias.  Neurological: Negative for headaches.  Psychiatric/Behavioral: Negative for sleep disturbance.    Objective:  BP (!) 146/80   Pulse 80   Temp 97.8 F (36.6 C) (Oral)   Ht 5' 4"  (1.626 m)   Wt 253 lb (114.8 kg)   BMI 43.43 kg/m   BP Readings from Last 3 Encounters:  07/27/18 (!) 146/80  07/06/18 (!) 157/72  06/21/18 (!) 159/78    Wt Readings from Last 3 Encounters:  07/27/18 253 lb (114.8 kg)  07/06/18 253 lb 2 oz (114.8 kg)  06/21/18 259 lb 2 oz (117.5 kg)     Physical Exam  Constitutional: She is oriented to person, place, and time. She appears well-developed and well-nourished. No distress.  HENT:  Head: Normocephalic and atraumatic.  Right Ear: External ear normal.  Left Ear: External ear normal.  Nose: Nose normal.  Mouth/Throat: Oropharynx is clear and moist.  Eyes: Pupils are equal, round, and reactive to light. Conjunctivae and EOM are normal.  Neck: Normal range of motion. Neck supple. No thyromegaly present.  Cardiovascular: Normal rate, regular rhythm  and normal heart sounds.  No murmur heard. Pulmonary/Chest: Effort normal and breath sounds normal. No respiratory distress. She has no wheezes. She has no rales.  Abdominal: Soft. Bowel sounds are normal. She exhibits no distension. There is no tenderness.  Lymphadenopathy:    She has no cervical adenopathy.    Neurological: She is alert and oriented to person, place, and time. She has normal reflexes.  Skin: Skin is warm and dry.  Psychiatric: She has a normal mood and affect. Her behavior is normal. Judgment and thought content normal.      Assessment & Plan:   [place] was seen today for medical management of chronic issues.  Diagnoses and all orders for this visit:  Controlled type 2 diabetes mellitus with complication, without long-term current use of insulin ([place]) -     Cancel: Microalbumin / creatinine urine ratio -     CBC with Differential/Platelet -     CMP14+EGFR -     Lipid panel -     Microalbumin / creatinine urine ratio -     Urinalysis -     Bayer DCA Hb A1c Waived  Need for immunization against influenza -     Flu Vaccine QUAD 36+ mos IM  Other orders -     mirtazapine (REMERON) 15 MG tablet; Take 1 tablet (15 mg total) by mouth at bedtime. -     Discontinue: ARIPiprazole (ABILIFY) 5 MG tablet; Take 1 tablet (5 mg total) by mouth daily. -     nabumetone (RELAFEN) 500 MG tablet; Take 1-2 tablets (500-1,000 mg total) by mouth 2 (two) times daily. -     metFORMIN (GLUCOPHAGE-XR) 500 MG 24 hr tablet; TAKE ONE TABLET TWICE A DAY WITH FOOD -     diltiazem (CARDIZEM CD) 240 MG 24 hr capsule; Take 1 capsule (240 mg total) by mouth 2 (two) times daily. For blood pressure and heart rate control. -     ARIPiprazole (ABILIFY) 5 MG tablet; Take 1 tablet (5 mg total) by mouth daily.      I have discontinued Maliha Outten. B[place]ell's cyclobenzaprine and Vitamin D (Ergocalciferol). I am also having her maintain her Melatonin, mupirocin ointment, diphenhydrAMINE, apixaban, atorvastatin, Blood Glucose Monitor System, mirtazapine, nabumetone, metFORMIN, diltiazem, and ARIPiprazole.  Meds ordered this encounter  Medications  . mirtazapine (REMERON) 15 MG tablet    Sig: Take 1 tablet (15 mg total) by mouth at bedtime.    Dispense:  30 tablet    Refill:  3  . DISCONTD: ARIPiprazole  (ABILIFY) 5 MG tablet    Sig: Take 1 tablet (5 mg total) by mouth daily.    Dispense:  30 tablet    Refill:  2  . nabumetone (RELAFEN) 500 MG tablet    Sig: Take 1-2 tablets (500-1,000 mg total) by mouth 2 (two) times daily.    Dispense:  120 tablet    Refill:  2  . metFORMIN (GLUCOPHAGE-XR) 500 MG 24 hr tablet    Sig: TAKE ONE TABLET TWICE A DAY WITH FOOD    Dispense:  60 tablet    Refill:  2  . diltiazem (CARDIZEM CD) 240 MG 24 hr capsule    Sig: Take 1 capsule (240 mg total) by mouth 2 (two) times daily. For blood pressure and heart rate control.    Dispense:  180 capsule    Refill:  1  . ARIPiprazole (ABILIFY) 5 MG tablet    Sig: Take 1 tablet (5 mg total) by mouth daily.  Dispense:  30 tablet    Refill:  2     Follow-up: Return in about 3 months (around 10/27/2018).  Claretta Fraise, M.D.

## 2018-07-28 ENCOUNTER — Other Ambulatory Visit: Payer: Self-pay | Admitting: Family Medicine

## 2018-07-28 LAB — MICROALBUMIN / CREATININE URINE RATIO
Creatinine, Urine: 142.9 mg/dL
Microalb/Creat Ratio: 5.7 mg/g creat (ref 0.0–30.0)
Microalbumin, Urine: 8.1 ug/mL

## 2018-08-06 ENCOUNTER — Ambulatory Visit: Payer: PRIVATE HEALTH INSURANCE | Admitting: Family Medicine

## 2018-09-03 ENCOUNTER — Other Ambulatory Visit: Payer: Self-pay | Admitting: Family Medicine

## 2018-11-01 ENCOUNTER — Ambulatory Visit (INDEPENDENT_AMBULATORY_CARE_PROVIDER_SITE_OTHER): Payer: Medicare Other | Admitting: Family Medicine

## 2018-11-01 ENCOUNTER — Encounter: Payer: Self-pay | Admitting: Family Medicine

## 2018-11-01 VITALS — BP 164/85 | HR 115 | Temp 96.8°F | Ht 64.0 in | Wt 266.5 lb

## 2018-11-01 DIAGNOSIS — R200 Anesthesia of skin: Secondary | ICD-10-CM | POA: Diagnosis not present

## 2018-11-01 DIAGNOSIS — E118 Type 2 diabetes mellitus with unspecified complications: Secondary | ICD-10-CM | POA: Diagnosis not present

## 2018-11-01 DIAGNOSIS — R202 Paresthesia of skin: Secondary | ICD-10-CM | POA: Diagnosis not present

## 2018-11-01 DIAGNOSIS — I10 Essential (primary) hypertension: Secondary | ICD-10-CM | POA: Diagnosis not present

## 2018-11-01 DIAGNOSIS — E785 Hyperlipidemia, unspecified: Secondary | ICD-10-CM

## 2018-11-01 LAB — BAYER DCA HB A1C WAIVED: HB A1C (BAYER DCA - WAIVED): 6.8 % (ref <7.0)

## 2018-11-01 MED ORDER — METFORMIN HCL ER 500 MG PO TB24: ORAL_TABLET | ORAL | 2 refills | Status: DC

## 2018-11-01 MED ORDER — ARIPIPRAZOLE 5 MG PO TABS: 5.0000 mg | ORAL_TABLET | Freq: Every day | ORAL | 2 refills | Status: DC

## 2018-11-01 MED ORDER — MIRTAZAPINE 15 MG PO TABS: 15.0000 mg | ORAL_TABLET | Freq: Every day | ORAL | 3 refills | Status: DC

## 2018-11-01 MED ORDER — VENLAFAXINE HCL ER 75 MG PO CP24: 75.0000 mg | ORAL_CAPSULE | Freq: Two times a day (BID) | ORAL | 2 refills | Status: DC

## 2018-11-01 MED ORDER — NABUMETONE 500 MG PO TABS: 500.0000 mg | ORAL_TABLET | Freq: Two times a day (BID) | ORAL | 2 refills | Status: DC

## 2018-11-01 MED ORDER — CARVEDILOL 3.125 MG PO TABS: 3.1250 mg | ORAL_TABLET | Freq: Two times a day (BID) | ORAL | 3 refills | Status: DC

## 2018-11-01 MED ORDER — DILTIAZEM HCL ER COATED BEADS 240 MG PO CP24: 240.0000 mg | ORAL_CAPSULE | Freq: Two times a day (BID) | ORAL | 1 refills | Status: DC

## 2018-11-01 NOTE — Progress Notes (Signed)
Subjective:  Patient ID: Diana Mathews,  female    DOB: 02-22-1951  Age: 68 y.o.    CC: Medical Management of Chronic Issues   HPI Diana Mathews presents for  follow-up of hypertension. Patient has no history of headache chest pain or shortness of breath or recent cough. Patient also denies symptoms of TIA such as numbness weakness lateralizing. Patient denies side effects from medication. States taking it regularly.  Patient also  in for follow-up of elevated cholesterol. Doing well without complaints on current medication. Denies side effects  including myalgia and arthralgia and nausea. Also in today for liver function testing. Currently no chest pain, shortness of breath or other cardiovascular related symptoms noted.  Follow-up of diabetes. Patient does check blood sugar at home. Readings run between 130 and 140 Patient denies symptoms such as excessive hunger or urinary frequency, excessive hunger, nausea No significant hypoglycemic spells noted. Medications reviewed. Pt reports taking them regularly. Pt. denies complication/adverse reaction today.   Struggling with depression still. Depression screen [facility] 2/9 11/01/2018 07/27/2018 07/06/2018 05/20/2018 04/09/2017  Decreased Interest _0 Down, Depressed, Hopeless _1 PHQ - 2 Score _2 Altered sleeping _3 Tired, decreased energy _4 Change in appetite _5 Feeling bad or failure about yourself  _6 Trouble concentrating _7 0  Moving slowly or fidgety/restless _8 0 0  Suicidal thoughts _9 0  PHQ-9 Score _10 Difficult doing work/chores - Somewhat difficult - - Not difficult at all  Some recent data might be hidden    History Diana Mathews has a past medical history of Arthritis, Controlled type 2 diabetes mellitus with complication, without long-term current use of insulin ([place]) (07/06/2018), Depression, Headache, HLD (hyperlipidemia) (08/26/2016),  Hypertension, and Sleep apnea.   She has a past surgical history that includes Abdominal hysterectomy; Wrist surgery (Bilateral); Hernia repair; and Cholecystectomy.   Her family history includes Arthritis in her mother, sister, and sister; COPD in her mother; Diabetes in her brother, brother, mother, sister, and sister; Hypertension in her mother; Mental illness in her brother.She reports that she has never smoked. She has never used smokeless tobacco. She reports that she does not drink alcohol or use drugs.  Current Outpatient Medications on File Prior to Visit  Medication Sig Dispense Refill  . ACCU-CHEK FASTCLIX LANCETS MISC 4 TIMES DAILY 306 each 3  . ACCU-CHEK GUIDE test strip 4 TIMES DAILY 400 each 3  . apixaban (ELIQUIS) 5 MG TABS tablet Take 1 tablet (5 mg total) by mouth 2 (two) times daily. 60 tablet 11  . atorvastatin (LIPITOR) 80 MG tablet Take 1 tablet (80 mg total) by mouth daily. 30 tablet 11  . Blood Glucose Monitoring Suppl (BLOOD GLUCOSE MONITOR SYSTEM) w/Device KIT 1 Device by Does not apply route 2 (two) times daily. 1 each 0  . diphenhydrAMINE (BENADRYL) 25 mg capsule Take 25 mg by mouth at bedtime as needed.    . Melatonin 10 MG CAPS Take 20 mg by mouth at bedtime.    . mupirocin ointment (BACTROBAN) 2 % Apply 1 application topically 2 (two) times daily. 22 g 0   No current facility-administered medications on file prior to visit.     ROS Review of Systems  Constitutional: Negative.  HENT: Negative for congestion.   Eyes: Negative for visual disturbance.  Respiratory: Positive for shortness of breath.   Cardiovascular: Negative for chest pain.       Noted that pulse and BP high at home  Gastrointestinal: Negative for abdominal pain, constipation, diarrhea, nausea and vomiting.  Genitourinary: Negative for difficulty urinating.  Musculoskeletal: Negative for arthralgias and myalgias.  Neurological: Negative for headaches.  Psychiatric/Behavioral: Negative for  sleep disturbance.    Objective:  BP (!) 164/85   Pulse (!) 115   Temp (!) 96.8 F (36 C) (Oral)   Ht _0  (1.626 m)   Wt 266 lb 8 oz (120.9 kg)   BMI 45.74 kg/m   BP Readings from Last 3 Encounters:  11/01/18 (!) 164/85  07/27/18 (!) 146/80  07/06/18 (!) 157/72    Wt Readings from Last 3 Encounters:  11/01/18 266 lb 8 oz (120.9 kg)  07/27/18 253 lb (114.8 kg)  07/06/18 253 lb 2 oz (114.8 kg)     Physical Exam Constitutional:      General: She is not in acute distress.    Appearance: She is well-developed.  HENT:     Head: Normocephalic and atraumatic.     Right Ear: External ear normal.     Left Ear: External ear normal.     Nose: Nose normal.  Eyes:     Conjunctiva/sclera: Conjunctivae normal.     Pupils: Pupils are equal, round, and reactive to light.  Neck:     Musculoskeletal: Normal range of motion and neck supple.     Thyroid: No thyromegaly.  Cardiovascular:     Rate and Rhythm: Normal rate and regular rhythm.     Heart sounds: Normal heart sounds. No murmur.  Pulmonary:     Effort: Pulmonary effort is normal. No respiratory distress.     Breath sounds: Wheezing present. No rhonchi or rales.  Abdominal:     General: Bowel sounds are normal. There is no distension.     Palpations: Abdomen is soft.     Tenderness: There is no abdominal tenderness.  Lymphadenopathy:     Cervical: No cervical adenopathy.  Skin:    General: Skin is warm and dry.  Neurological:     Mental Status: She is alert and oriented to person, place, and time.     Deep Tendon Reflexes: Reflexes are normal and symmetric.  Psychiatric:        Behavior: Behavior normal.        Thought Content: Thought content normal.        Judgment: Judgment normal.     Diabetic Foot Exam - Simple   Simple Foot Form Diabetic Foot exam was performed with the following findings:  Yes 11/01/2018  8:22 AM  Visual Inspection No deformities, no ulcerations, no other skin breakdown bilaterally:   Yes Sensation Testing Intact to touch and monofilament testing bilaterally:  Yes Pulse Check Posterior Tibialis and Dorsalis pulse intact bilaterally:  Yes Comments       Assessment & Plan:   Diana Mathews was seen today for medical management of chronic issues.  Diagnoses and all orders for this visit:  Essential hypertension  Hyperlipidemia, unspecified hyperlipidemia type  Controlled type 2 diabetes mellitus with complication, without long-term current use of insulin ([facility]) -     CMP14+EGFR -     Bayer DCA Hb A1c Waived  Numbness and tingling of lower extremity -     Nerve conduction test; Future  Other orders -  venlafaxine XR (EFFEXOR XR) 75 MG 24 hr capsule; Take 1 capsule (75 mg total) by mouth 2 (two) times daily. -     carvedilol (COREG) 3.125 MG tablet; Take 1 tablet (3.125 mg total) by mouth 2 (two) times daily with a meal. For blood pressure &/or kidney function -     ARIPiprazole (ABILIFY) 5 MG tablet; Take 1 tablet (5 mg total) by mouth daily. -     diltiazem (CARDIZEM CD) 240 MG 24 hr capsule; Take 1 capsule (240 mg total) by mouth 2 (two) times daily. For blood pressure and heart rate control. -     metFORMIN (GLUCOPHAGE-XR) 500 MG 24 hr tablet; TAKE ONE TABLET TWICE A DAY WITH FOOD -     mirtazapine (REMERON) 15 MG tablet; Take 1 tablet (15 mg total) by mouth at bedtime. -     nabumetone (RELAFEN) 500 MG tablet; Take 1-2 tablets (500-1,000 mg total) by mouth 2 (two) times daily.   I am having Diana Mathews start on venlafaxine XR and carvedilol. I am also having her maintain her Melatonin, mupirocin ointment, diphenhydrAMINE, apixaban, atorvastatin, Blood Glucose Monitor System, ACCU-CHEK GUIDE, ACCU-CHEK FASTCLIX LANCETS, ARIPiprazole, diltiazem, metFORMIN, mirtazapine, and nabumetone.  Meds ordered this encounter  Medications  . venlafaxine XR (EFFEXOR XR) 75 MG 24 hr capsule    Sig: Take 1 capsule (75 mg total) by mouth 2 (two) times daily.    Dispense:   60 capsule    Refill:  2  . carvedilol (COREG) 3.125 MG tablet    Sig: Take 1 tablet (3.125 mg total) by mouth 2 (two) times daily with a meal. For blood pressure &/or kidney function    Dispense:  60 tablet    Refill:  3  . ARIPiprazole (ABILIFY) 5 MG tablet    Sig: Take 1 tablet (5 mg total) by mouth daily.    Dispense:  30 tablet    Refill:  2  . diltiazem (CARDIZEM CD) 240 MG 24 hr capsule    Sig: Take 1 capsule (240 mg total) by mouth 2 (two) times daily. For blood pressure and heart rate control.    Dispense:  180 capsule    Refill:  1  . metFORMIN (GLUCOPHAGE-XR) 500 MG 24 hr tablet    Sig: TAKE ONE TABLET TWICE A DAY WITH FOOD    Dispense:  60 tablet    Refill:  2  . mirtazapine (REMERON) 15 MG tablet    Sig: Take 1 tablet (15 mg total) by mouth at bedtime.    Dispense:  30 tablet    Refill:  3  . nabumetone (RELAFEN) 500 MG tablet    Sig: Take 1-2 tablets (500-1,000 mg total) by mouth 2 (two) times daily.    Dispense:  120 tablet    Refill:  2     Follow-up: Return in about 2 weeks (around 11/15/2018).  Claretta Fraise, M.D.

## 2018-11-02 LAB — CMP14+EGFR
ALT: 32 IU/L (ref 0–32)
AST: 26 IU/L (ref 0–40)
Albumin/Globulin Ratio: 1.5 (ref 1.2–2.2)
Albumin: 4.3 g/dL (ref 3.6–4.8)
Alkaline Phosphatase: 118 IU/L — ABNORMAL HIGH (ref 39–117)
BUN/Creatinine Ratio: 14 (ref 12–28)
BUN: 10 mg/dL (ref 8–27)
Bilirubin Total: 0.3 mg/dL (ref 0.0–1.2)
CO2: 21 mmol/L (ref 20–29)
CREATININE: 0.72 mg/dL (ref 0.57–1.00)
Calcium: 9.2 mg/dL (ref 8.7–10.3)
Chloride: 101 mmol/L (ref 96–106)
GFR calc Af Amer: 102 mL/min/{1.73_m2} (ref >59)
GFR calc non Af Amer: 88 mL/min/{1.73_m2} (ref >59)
GLOBULIN, TOTAL: 2.9 g/dL (ref 1.5–4.5)
Glucose: 218 mg/dL — ABNORMAL HIGH (ref 65–99)
Potassium: 4.1 mmol/L (ref 3.5–5.2)
Sodium: 142 mmol/L (ref 134–144)
Total Protein: 7.2 g/dL (ref 6.0–8.5)

## 2018-11-02 NOTE — Progress Notes (Signed)
Hello Kaisyn,  Your lab result is normal.Some minor variations that are not significant are commonly marked abnormal, but do not represent any medical problem for you.  Best regards, Claretta Fraise, M.D.

## 2018-11-15 ENCOUNTER — Telehealth: Payer: Self-pay

## 2018-11-15 ENCOUNTER — Ambulatory Visit: Payer: Medicare Other | Admitting: Family Medicine

## 2018-11-15 NOTE — Telephone Encounter (Signed)
[facility] - Left a message.  Patient has a PHQ score of 17 from the [place] Report.

## 2018-11-18 ENCOUNTER — Ambulatory Visit: Payer: Medicare Other | Admitting: Family Medicine

## 2018-11-22 ENCOUNTER — Encounter: Payer: Self-pay | Admitting: Family Medicine

## 2018-11-22 ENCOUNTER — Ambulatory Visit (INDEPENDENT_AMBULATORY_CARE_PROVIDER_SITE_OTHER): Payer: Medicare Other | Admitting: Family Medicine

## 2018-11-22 VITALS — BP 123/63 | HR 72 | Temp 97.6°F | Ht 64.0 in | Wt 257.5 lb

## 2018-11-22 DIAGNOSIS — I10 Essential (primary) hypertension: Secondary | ICD-10-CM | POA: Diagnosis not present

## 2018-11-22 NOTE — Progress Notes (Signed)
Subjective:  Patient ID: Diana Mathews, female    DOB: 05/11/1951  Age: 68 y.o. MRN: [id number]  CC: Medical Management of Chronic Issues   HPI CORRINA STEFFENSEN presents for  follow-up of hypertension. Patient has no history of headache chest pain or shortness of breath.  Patient also denies symptoms of TIA such as focal numbness or weakness. Patient states was drowsy at first from coreg, but sx wore off after a week or so. Now denies side effects from medication. States taking it regularly.   History Lexandra has a past medical history of Arthritis, Controlled type 2 diabetes mellitus with complication, without long-term current use of insulin ([place]) (07/06/2018), Depression, Headache, HLD (hyperlipidemia) (08/26/2016), Hypertension, and Sleep apnea.   She has a past surgical history that includes Abdominal hysterectomy; Wrist surgery (Bilateral); Hernia repair; and Cholecystectomy.   Her family history includes Arthritis in her mother, sister, and sister; COPD in her mother; Diabetes in her brother, brother, mother, sister, and sister; Hypertension in her mother; Mental illness in her brother.She reports that she has never smoked. She has never used smokeless tobacco. She reports that she does not drink alcohol or use drugs.  Current Outpatient Medications on File Prior to Visit  Medication Sig Dispense Refill  . ACCU-CHEK FASTCLIX LANCETS MISC 4 TIMES DAILY 306 each 3  . ACCU-CHEK GUIDE test strip 4 TIMES DAILY 400 each 3  . apixaban (ELIQUIS) 5 MG TABS tablet Take 1 tablet (5 mg total) by mouth 2 (two) times daily. 60 tablet 11  . ARIPiprazole (ABILIFY) 5 MG tablet Take 1 tablet (5 mg total) by mouth daily. 30 tablet 2  . atorvastatin (LIPITOR) 80 MG tablet Take 1 tablet (80 mg total) by mouth daily. 30 tablet 11  . Blood Glucose Monitoring Suppl (BLOOD GLUCOSE MONITOR SYSTEM) w/Device KIT 1 Device by Does not apply route 2 (two) times daily. 1 each 0  . carvedilol (COREG) 3.125 MG  tablet Take 1 tablet (3.125 mg total) by mouth 2 (two) times daily with a meal. For blood pressure &/or kidney function 60 tablet 3  . diltiazem (CARDIZEM CD) 240 MG 24 hr capsule Take 1 capsule (240 mg total) by mouth 2 (two) times daily. For blood pressure and heart rate control. 180 capsule 1  . diphenhydrAMINE (BENADRYL) 25 mg capsule Take 25 mg by mouth at bedtime as needed.    . Melatonin 10 MG CAPS Take 20 mg by mouth at bedtime.    . metFORMIN (GLUCOPHAGE-XR) 500 MG 24 hr tablet TAKE ONE TABLET TWICE A DAY WITH FOOD 60 tablet 2  . mirtazapine (REMERON) 15 MG tablet Take 1 tablet (15 mg total) by mouth at bedtime. 30 tablet 3  . mupirocin ointment (BACTROBAN) 2 % Apply 1 application topically 2 (two) times daily. 22 g 0  . nabumetone (RELAFEN) 500 MG tablet Take 1-2 tablets (500-1,000 mg total) by mouth 2 (two) times daily. 120 tablet 2  . venlafaxine XR (EFFEXOR XR) 75 MG 24 hr capsule Take 1 capsule (75 mg total) by mouth 2 (two) times daily. 60 capsule 2   No current facility-administered medications on file prior to visit.     ROS Review of Systems  Constitutional: Negative.   HENT: Negative.   Eyes: Negative for visual disturbance.  Respiratory: Negative for shortness of breath.   Cardiovascular: Negative for chest pain.  Gastrointestinal: Negative for abdominal pain.  Musculoskeletal: Negative for arthralgias.    Objective:  BP 123/63   Pulse 72  Temp 97.6 F (36.4 C) (Oral)   Ht _0  (1.626 m)   Wt 257 lb 8 oz (116.8 kg)   BMI 44.20 kg/m   BP Readings from Last 3 Encounters:  11/22/18 123/63  11/01/18 (!) 164/85  07/27/18 (!) 146/80    Wt Readings from Last 3 Encounters:  11/22/18 257 lb 8 oz (116.8 kg)  11/01/18 266 lb 8 oz (120.9 kg)  07/27/18 253 lb (114.8 kg)     Physical Exam Constitutional:      General: She is not in acute distress.    Appearance: She is well-developed.  Cardiovascular:     Rate and Rhythm: Normal rate and regular rhythm.    Pulmonary:     Breath sounds: Normal breath sounds.  Skin:    General: Skin is warm and dry.  Neurological:     Mental Status: She is alert and oriented to person, place, and time.       Assessment & Plan:   Shannie was seen today for medical management of chronic issues.  Diagnoses and all orders for this visit:  Essential hypertension   Allergies as of 11/22/2018      Reactions   Codeine Rash      Medication List       Accurate as of November 22, 2018  2:52 PM. Always use your most recent med list.        ACCU-CHEK FASTCLIX LANCETS Misc 4 TIMES DAILY   ACCU-CHEK GUIDE test strip Generic drug:  glucose blood 4 TIMES DAILY   apixaban 5 MG Tabs tablet Commonly known as:  ELIQUIS Take 1 tablet (5 mg total) by mouth 2 (two) times daily.   ARIPiprazole 5 MG tablet Commonly known as:  ABILIFY Take 1 tablet (5 mg total) by mouth daily.   atorvastatin 80 MG tablet Commonly known as:  LIPITOR Take 1 tablet (80 mg total) by mouth daily.   Blood Glucose Monitor System w/Device Kit 1 Device by Does not apply route 2 (two) times daily.   carvedilol 3.125 MG tablet Commonly known as:  COREG Take 1 tablet (3.125 mg total) by mouth 2 (two) times daily with a meal. For blood pressure &/or kidney function   diltiazem 240 MG 24 hr capsule Commonly known as:  CARDIZEM CD Take 1 capsule (240 mg total) by mouth 2 (two) times daily. For blood pressure and heart rate control.   diphenhydrAMINE 25 mg capsule Commonly known as:  BENADRYL Take 25 mg by mouth at bedtime as needed.   Melatonin 10 MG Caps Take 20 mg by mouth at bedtime.   metFORMIN 500 MG 24 hr tablet Commonly known as:  GLUCOPHAGE-XR TAKE ONE TABLET TWICE A DAY WITH FOOD   mirtazapine 15 MG tablet Commonly known as:  REMERON Take 1 tablet (15 mg total) by mouth at bedtime.   mupirocin ointment 2 % Commonly known as:  BACTROBAN Apply 1 application topically 2 (two) times daily.   nabumetone 500 MG  tablet Commonly known as:  RELAFEN Take 1-2 tablets (500-1,000 mg total) by mouth 2 (two) times daily.   venlafaxine XR 75 MG 24 hr capsule Commonly known as:  EFFEXOR XR Take 1 capsule (75 mg total) by mouth 2 (two) times daily.       No orders of the defined types were placed in this encounter.     Follow-up: Return in about 3 months (around 02/21/2019).  Claretta Fraise, M.D.

## 2018-11-30 ENCOUNTER — Encounter: Payer: Medicare Other | Admitting: Family Medicine

## 2018-12-22 ENCOUNTER — Other Ambulatory Visit: Payer: Self-pay | Admitting: *Deleted

## 2018-12-22 MED ORDER — MIRTAZAPINE 15 MG PO TABS: 15.0000 mg | ORAL_TABLET | Freq: Every day | ORAL | 3 refills | Status: DC

## 2018-12-30 ENCOUNTER — Other Ambulatory Visit: Payer: Self-pay | Admitting: Family Medicine

## 2018-12-31 ENCOUNTER — Ambulatory Visit: Payer: Medicare Other | Admitting: Family Medicine

## 2019-01-10 ENCOUNTER — Ambulatory Visit (INDEPENDENT_AMBULATORY_CARE_PROVIDER_SITE_OTHER): Payer: Medicare Other | Admitting: Family Medicine

## 2019-01-10 ENCOUNTER — Encounter: Payer: Self-pay | Admitting: Family Medicine

## 2019-01-10 ENCOUNTER — Other Ambulatory Visit: Payer: Self-pay

## 2019-01-10 VITALS — BP 119/65 | HR 84 | Temp 97.4°F | Ht 64.0 in | Wt 258.5 lb

## 2019-01-10 DIAGNOSIS — Z0000 Encounter for general adult medical examination without abnormal findings: Secondary | ICD-10-CM | POA: Diagnosis not present

## 2019-01-10 MED ORDER — MINOCYCLINE HCL 100 MG PO CAPS: 100.0000 mg | ORAL_CAPSULE | Freq: Two times a day (BID) | ORAL | 0 refills | Status: DC

## 2019-01-10 MED ORDER — CARVEDILOL 3.125 MG PO TABS: 4.6875 mg | ORAL_TABLET | Freq: Two times a day (BID) | ORAL | 1 refills | Status: DC

## 2019-01-10 NOTE — Progress Notes (Signed)
Subjective:   Diana Mathews is a 68 y.o. female who presents for an Initial Medicare Annual Wellness Visit.   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  HENT: Negative for congestion, ear pain, hearing loss, postnasal drip, rhinorrhea, sneezing, sore throat and trouble swallowing.   Eyes: Negative for pain.  Respiratory: Negative for cough, chest tightness and shortness of breath.   Cardiovascular: Positive for palpitations (rate up to 140 in a shower last week.). Negative for chest pain.  Gastrointestinal: Negative for abdominal pain, constipation, diarrhea, nausea and vomiting.  Endocrine: Negative for cold intolerance, heat intolerance, polydipsia, polyphagia and polyuria.  Genitourinary: Negative for dysuria, frequency and menstrual problem.  Musculoskeletal: Negative for arthralgias and joint swelling.  Skin: Negative for rash.  Allergic/Immunologic: Negative for environmental allergies.  Neurological: Negative for dizziness, weakness, numbness and headaches.  Psychiatric/Behavioral: Negative for agitation and dysphoric mood.   Patient Active Problem List   Diagnosis Date Noted  . Controlled type 2 diabetes mellitus with complication, without long-term current use of insulin ([place]) 07/06/2018  . Fibrillation, atrial ([place]) 06/13/2018  . Dysarthria 06/12/2018  . New onset atrial fibrillation ([place]) 06/12/2018  . Hyperglycemia 06/12/2018  . Obesity, Class III, BMI 40-49.9 (morbid obesity) ([place]) 06/12/2018  . Atrial fibrillation with rapid ventricular response ([place])   . Palpitations   . Slurred speech   . HLD (hyperlipidemia) 08/26/2016  . Essential hypertension 04/15/2016  .  Mood disorder ([place]) 04/15/2016  . Insomnia 04/15/2016     Current Medications (verified) Outpatient Encounter Medications as of 01/10/2019  Medication Sig  . ACCU-CHEK FASTCLIX LANCETS MISC 4 TIMES DAILY  . ACCU-CHEK GUIDE test strip 4 TIMES DAILY  . apixaban (ELIQUIS) 5 MG TABS tablet Take 1 tablet (5 mg total) by mouth 2 (two) times daily.  . ARIPiprazole (ABILIFY) 5 MG tablet Take 1 tablet (5 mg total) by mouth daily.  Marland Kitchen atorvastatin (LIPITOR) 80 MG tablet Take 1 tablet (80 mg total) by mouth daily.  . Blood Glucose Monitoring Suppl (BLOOD GLUCOSE MONITOR SYSTEM) w/Device KIT 1 Device by Does not apply route 2 (two) times daily.  . carvedilol (COREG) 3.125 MG tablet Take 1.5 tablets (4.6875 mg total) by mouth 2 (two) times daily with a meal. For heart rate  . diltiazem (CARDIZEM CD) 240 MG 24 hr capsule Take 1 capsule (240 mg total) by mouth 2 (two) times daily. For blood pressure and heart rate control.  . diphenhydrAMINE (BENADRYL) 25 mg capsule Take 25 mg by mouth at bedtime as needed.  . Melatonin 10 MG CAPS Take 20 mg by mouth at bedtime.  . metFORMIN (GLUCOPHAGE-XR) 500 MG 24 hr tablet TAKE ONE TABLET TWICE A DAY WITH FOOD  . mirtazapine (REMERON) 15 MG tablet Take 1 tablet (15 mg total) by mouth at bedtime.  . mupirocin ointment (BACTROBAN) 2 % Apply 1 application topically 2 (two) times daily.  . nabumetone (RELAFEN) 500 MG tablet Take 1-2 tablets (500-1,000 mg total) by mouth 2 (two) times daily.  . [DISCONTINUED] carvedilol (COREG) 3.125 MG tablet Take 1 tablet (3.125 mg total) by mouth 2 (two) times daily with a meal. For blood pressure &/or kidney function  . minocycline (MINOCIN) 100 MG capsule Take 1 capsule (100 mg total) by mouth 2 (two) times daily. Take  on an empty stomach  . [DISCONTINUED] venlafaxine XR (EFFEXOR-XR) 75 MG 24 hr capsule TAKE ONE CAPSULE BY MOUTH TWICE A DAY   No facility-administered encounter medications on file as of 01/10/2019.     Allergies  (verified) Codeine   History: Past Medical History:  Diagnosis Date  . Arthritis    osteoarthritis  . Controlled type 2 diabetes mellitus with complication, without long-term current use of insulin ([place]) 07/06/2018  . Depression    pt taking prozac  . Headache   . HLD (hyperlipidemia) 08/26/2016  . Hypertension   . Sleep apnea    pt is supposed to be on CPAP  . Stroke [facility])    Past Surgical History:  Procedure Laterality Date  . ABDOMINAL HYSTERECTOMY    . CHOLECYSTECTOMY    . FOOT SURGERY Left   . HERNIA REPAIR    . TUBAL LIGATION    . WRIST SURGERY Bilateral    Carpal Tunnel   Family History  Problem Relation Age of Onset  . Arthritis Mother   . COPD Mother   . Diabetes Mother   . Hypertension Mother   . Arthritis Sister   . Diabetes Brother   . Arthritis Sister   . Diabetes Sister   . Diabetes Sister   . Diabetes Brother   . Mental illness Brother    Social History   Occupational History  . Occupation: Retired  Tobacco Use  . Smoking status: Former Smoker    Types: Cigarettes    Last attempt to quit: 01/09/1985    Years since quitting: 34.0  . Smokeless tobacco: Never Used  Substance and Sexual Activity  . Alcohol use: No    Alcohol/week: 0.0 standard drinks  . Drug use: No  . Sexual activity: Never    Birth control/protection: Surgical    Do you feel safe at home?  Yes Are there smokers in your home (other than you)? Yes  Dietary issues and exercise activities: Current Exercise Habits: The patient does not participate in regular exercise at present, Exercise limited by: neurologic condition(s)  Current Dietary habits:  Salads. Baked chicken. Off bread. Avoids fried foods. Eats fruit- apples and grapes.    Objective:    Today's Vitals   01/10/19 1052 01/10/19 1053  BP: 119/65   Pulse: 84   Temp: (!) 97.4 F (36.3 C)   TempSrc: Oral   Weight: 258 lb 8 oz (117.3 kg)   Height: 5' 4"  (1.626 m)   PainSc:  7    Body mass index is 44.37  kg/m.  Activities of Daily Living In your present state of health, do you have any difficulty performing the following activities: 01/10/2019 06/14/2018  Hearing? N N  Vision? N N  Difficulty concentrating or making decisions? Y N  Comment pt states her memory isn't what it used to be prior to her stroke in August 2019 -  Walking or climbing stairs? Y N  Comment pt was told to not use steps if possible due to balance issues after stroke -  Dressing or bathing? N N  Doing errands, shopping? N N  Preparing Food and eating ? N -  Using the Toilet? N -  In the past six months, have you accidently leaked urine? N -  Do you have problems with loss of bowel control? N -  Managing your Medications? N -  Managing your Finances? N -  Housekeeping or managing your Housekeeping? N -  Some recent data might be hidden  Cardiac Risk Factors include: advanced age (>78mn, >>78women);diabetes mellitus;dyslipidemia;hypertension;sedentary lifestyle;obesity (BMI >30kg/m2)  Depression Screen PHQ 2/9 Scores 01/10/2019 11/22/2018 11/01/2018 07/27/2018  PHQ - 2 Score 3 4 5 6   PHQ- 9 Score 11 12 17 21     [facility] 01/10/2019 11/22/2018 11/01/2018  Decreased Interest 1 2 2   Down, Depressed, Hopeless 2 2 3   PHQ - 2 Score 3 4 5   Altered sleeping 1 1 2   Tired, decreased energy 2 2 2   Change in appetite 1 1 2   Feeling bad or failure about yourself  1 1 1   Trouble concentrating 1 1 2   Moving slowly or fidgety/restless 1 1 1   Suicidal thoughts 1 1 2   PHQ-9 Score 11 12 17   Difficult doing work/chores - - -  Some recent data might be hidden    Fall Risk Fall Risk  01/10/2019 11/22/2018 07/27/2018 04/09/2017 12/19/2016  Falls in the past year? 0 0 No Yes No  Number falls in past yr: - - - 2 or more -    Cognitive Function: MMSE - Mini Mental State Exam 01/10/2019  Orientation to time 5  Orientation to Place 5  Registration 3  Attention/ Calculation 3  Recall 2  Language- name 2 objects 2   Language- repeat 1  Language- follow 3 step command 3  Language- read & follow direction 1  Write a sentence 1  Copy design 1  Total score 27    Immunizations and Health Maintenance Immunization History  Administered Date(s) Administered  . Influenza,inj,Quad PF,6+ Mos 07/25/2016, 07/27/2018   Health Maintenance Due  Topic Date Due  . OPHTHALMOLOGY EXAM  10/21/1963  . TETANUS/TDAP  10/20/1972  . PAP SMEAR-Modifier  10/20/1974  . PNA vac Low Risk Adult (1 of 2 - PCV13) 10/20/2018    Patient Care Team: SClaretta Fraise MD as PCP - General (Family Medicine)  Indicate any recent Medical Services you may have received from other than [facility] providers in the past year (date may be approximate).    Assessment:    Annual Wellness Visit    Screening Tests Health Maintenance  Topic Date Due  . OPHTHALMOLOGY EXAM  10/21/1963  . TETANUS/TDAP  10/20/1972  . PAP SMEAR-Modifier  10/20/1974  . PNA vac Low Risk Adult (1 of 2 - PCV13) 10/20/2018  . MAMMOGRAM  07/28/2019 (Originally 10/31/2016)  . COLONOSCOPY  07/28/2019 (Originally 10/21/2003)  . Hepatitis C Screening  07/28/2019 (Originally [id number]  . HEMOGLOBIN A1C  05/02/2019  . URINE MICROALBUMIN  07/28/2019  . FOOT EXAM  11/02/2019  . INFLUENZA VACCINE  Completed  . DEXA SCAN  Completed  . HIV Screening  Completed        Plan:   During the course of the visit [place] educated and counseled about the following appropriate screening and preventive services:   Vaccines to include Pneumoccal, Influenza, Td and Shingles  Colorectal cancer screening  Cardiovascular disease screening  Diabetes screening  Bone Denisty / Osteoporosis Screening  Mammogram  Glaucoma screening / Diabetic Eye Exam  Nutrition counseling, done  Advanced Directives  Physical Activity   Goals    . DIET - REDUCE CALORIE INTAKE    . Exercise 150 min/wk Moderate Activity      Allergies as of 01/10/2019      Reactions   Codeine Rash       Medication List       Accurate as of January 10, 2019  1:07 PM. Always use your most recent med  list.        Accu-Chek FastClix Lancets Misc 4 TIMES DAILY   Accu-Chek Guide test strip Generic drug:  glucose blood 4 TIMES DAILY   apixaban 5 MG Tabs tablet Commonly known as:  ELIQUIS Take 1 tablet (5 mg total) by mouth 2 (two) times daily.   ARIPiprazole 5 MG tablet Commonly known as:  Abilify Take 1 tablet (5 mg total) by mouth daily.   atorvastatin 80 MG tablet Commonly known as:  Lipitor Take 1 tablet (80 mg total) by mouth daily.   Blood Glucose Monitor System w/Device Kit 1 Device by Does not apply route 2 (two) times daily.   carvedilol 3.125 MG tablet Commonly known as:  COREG Take 1.5 tablets (4.6875 mg total) by mouth 2 (two) times daily with a meal. For heart rate   diltiazem 240 MG 24 hr capsule Commonly known as:  Cardizem CD Take 1 capsule (240 mg total) by mouth 2 (two) times daily. For blood pressure and heart rate control.   diphenhydrAMINE 25 mg capsule Commonly known as:  BENADRYL Take 25 mg by mouth at bedtime as needed.   Melatonin 10 MG Caps Take 20 mg by mouth at bedtime.   metFORMIN 500 MG 24 hr tablet Commonly known as:  GLUCOPHAGE-XR TAKE ONE TABLET TWICE A DAY WITH FOOD   minocycline 100 MG capsule Commonly known as:  Minocin Take 1 capsule (100 mg total) by mouth 2 (two) times daily. Take on an empty stomach   mirtazapine 15 MG tablet Commonly known as:  Remeron Take 1 tablet (15 mg total) by mouth at bedtime.   mupirocin ointment 2 % Commonly known as:  Bactroban Apply 1 application topically 2 (two) times daily.   nabumetone 500 MG tablet Commonly known as:  RELAFEN Take 1-2 tablets (500-1,000 mg total) by mouth 2 (two) times daily.        Patient Instructions (the written plan) were given to the patient.   Claretta Fraise, MD   01/10/2019

## 2019-01-21 ENCOUNTER — Other Ambulatory Visit: Payer: Self-pay | Admitting: Family Medicine

## 2019-02-09 ENCOUNTER — Other Ambulatory Visit: Payer: Self-pay

## 2019-02-09 ENCOUNTER — Encounter: Payer: Self-pay | Admitting: Family Medicine

## 2019-02-09 ENCOUNTER — Ambulatory Visit (INDEPENDENT_AMBULATORY_CARE_PROVIDER_SITE_OTHER): Payer: Medicare Other | Admitting: Family Medicine

## 2019-02-09 DIAGNOSIS — E118 Type 2 diabetes mellitus with unspecified complications: Secondary | ICD-10-CM

## 2019-02-09 DIAGNOSIS — I4811 Longstanding persistent atrial fibrillation: Secondary | ICD-10-CM

## 2019-02-09 DIAGNOSIS — E785 Hyperlipidemia, unspecified: Secondary | ICD-10-CM | POA: Diagnosis not present

## 2019-02-09 DIAGNOSIS — I10 Essential (primary) hypertension: Secondary | ICD-10-CM | POA: Diagnosis not present

## 2019-02-09 MED ORDER — CARVEDILOL 3.125 MG PO TABS: 4.6875 mg | ORAL_TABLET | Freq: Two times a day (BID) | ORAL | 1 refills | Status: DC

## 2019-02-09 MED ORDER — NABUMETONE 500 MG PO TABS: 1000.0000 mg | ORAL_TABLET | Freq: Two times a day (BID) | ORAL | 2 refills | Status: DC

## 2019-02-09 MED ORDER — ARIPIPRAZOLE 5 MG PO TABS: 5.0000 mg | ORAL_TABLET | Freq: Every day | ORAL | 2 refills | Status: DC

## 2019-02-09 MED ORDER — METFORMIN HCL ER 500 MG PO TB24: ORAL_TABLET | ORAL | 2 refills | Status: DC

## 2019-02-09 MED ORDER — MIRTAZAPINE 15 MG PO TABS: 15.0000 mg | ORAL_TABLET | Freq: Every day | ORAL | 3 refills | Status: DC

## 2019-02-09 NOTE — Progress Notes (Signed)
Subjective:  Patient ID: Diana Mathews,  female    DOB: [birth year]  Age: 68 y.o.    CC: No chief complaint on file.   HPI XIAO GRAUL presents for  follow-up of hypertension. Patient has no history of headache chest pain or shortness of breath or recent cough. Patient also denies symptoms of TIA such as numbness weakness lateralizing. Patient denies side effects from medication. States taking it regularly. BP 126/86 this AM.   Patient also  in for follow-up of elevated cholesterol. Doing well without complaints on current medication. Denies side effects  including myalgia and arthralgia and nausea. Also in today for liver function testing. Currently no chest pain, shortness of breath or other cardiovascular related symptoms noted.  Follow-up of diabetes. Patient does check blood sugar at home. Readings run between 120 and 130. Controlling carbs and lost 5 lb over 2 weeks. Patient denies symptoms such as excessive hunger or urinary frequency, excessive hunger, nausea No significant hypoglycemic spells noted. Medications reviewed. Pt reports taking them regularly. Pt. denies complication/adverse reaction today.   Left leg pain onset three days ago.Couldn't find any OTC acetaminophen. Staying home for 2-3 weeks. Using nabumetone 500 mg BID   History Paxton has a past medical history of Arthritis, Atrial fibrillation with rapid ventricular response ([place]), Controlled type 2 diabetes mellitus with complication, without long-term current use of insulin ([place]) (07/06/2018), Depression, Headache, HLD (hyperlipidemia) (08/26/2016), Hypertension, New onset atrial fibrillation ([place]) (06/12/2018), Sleep apnea, and Stroke ([place]).   She has a past surgical history that includes Abdominal hysterectomy; Wrist surgery (Bilateral); Hernia repair; Cholecystectomy; Tubal ligation; and Foot surgery (Left).   Her family history includes Arthritis in her mother, sister, and sister; COPD in her mother;  Diabetes in her brother, brother, mother, sister, and sister; Hypertension in her mother; Mental illness in her brother.She reports that she quit smoking about 34 years ago. Her smoking use included cigarettes. She has never used smokeless tobacco. She reports that she does not drink alcohol or use drugs.  Current Outpatient Medications on File Prior to Visit  Medication Sig Dispense Refill  . ACCU-CHEK FASTCLIX LANCETS MISC 4 TIMES DAILY 306 each 3  . ACCU-CHEK GUIDE test strip 4 TIMES DAILY 400 each 3  . apixaban (ELIQUIS) 5 MG TABS tablet Take 1 tablet (5 mg total) by mouth 2 (two) times daily. 60 tablet 11  . atorvastatin (LIPITOR) 80 MG tablet Take 1 tablet (80 mg total) by mouth daily. 30 tablet 11  . diltiazem (CARDIZEM CD) 240 MG 24 hr capsule Take 1 capsule (240 mg total) by mouth 2 (two) times daily. For blood pressure and heart rate control. 180 capsule 1  . diphenhydrAMINE (BENADRYL) 25 mg capsule Take 25 mg by mouth at bedtime as needed.    . Melatonin 10 MG CAPS Take 20 mg by mouth at bedtime.     No current facility-administered medications on file prior to visit.     ROS Review of Systems  Constitutional: Positive for fever (101 today.).  HENT: Positive for sore throat. Negative for congestion.   Eyes: Negative for visual disturbance.  Respiratory: Positive for cough. Negative for shortness of breath.   Cardiovascular: Negative for chest pain and leg swelling.  Gastrointestinal: Negative for abdominal pain, constipation, diarrhea, nausea and vomiting.  Genitourinary: Negative for difficulty urinating.  Musculoskeletal: Positive for arthralgias and myalgias.  Neurological: Negative for headaches.  Psychiatric/Behavioral: Negative for sleep disturbance.    Objective:  There were no vitals taken  for this visit.  BP Readings from Last 3 Encounters:  01/10/19 119/65  11/22/18 123/63  11/01/18 (!) 164/85    Wt Readings from Last 3 Encounters:  01/10/19 258 lb 8 oz  (117.3 kg)  11/22/18 257 lb 8 oz (116.8 kg)  11/01/18 266 lb 8 oz (120.9 kg)     Physical Exam  Diabetic Foot Exam - Simple   No data filed        Assessment & Plan:   Diagnoses and all orders for this visit:  Controlled type 2 diabetes mellitus with complication, without long-term current use of insulin ([facility])  Longstanding persistent atrial fibrillation  Obesity, Class III, BMI 40-49.9 (morbid obesity) ([place])  Hyperlipidemia, unspecified hyperlipidemia type  Essential hypertension  Other orders -     ARIPiprazole (ABILIFY) 5 MG tablet; Take 1 tablet (5 mg total) by mouth daily. -     carvedilol (COREG) 3.125 MG tablet; Take 1.5 tablets (4.6875 mg total) by mouth 2 (two) times daily with a meal. For heart rate -     metFORMIN (GLUCOPHAGE-XR) 500 MG 24 hr tablet; TAKE ONE TABLET TWICE A DAY WITH FOOD -     mirtazapine (REMERON) 15 MG tablet; Take 1 tablet (15 mg total) by mouth at bedtime. -     nabumetone (RELAFEN) 500 MG tablet; Take 2 tablets (1,000 mg total) by mouth 2 (two) times daily.   I have discontinued Quincy Prisco. Dunn's mupirocin ointment, Blood Glucose Monitor System, and minocycline. I have also changed her nabumetone. Additionally, I am having her maintain her Melatonin, diphenhydrAMINE, apixaban, atorvastatin, Accu-Chek Guide, Accu-Chek FastClix Lancets, diltiazem, ARIPiprazole, carvedilol, metFORMIN, and mirtazapine.    Increase Nabumetone to 1000 mg BID for leg. Report, redness, swelling, dyspnea   Virtual Visit via telephone Note  I discussed the limitations, risks, security and privacy concerns of performing an evaluation and management service by telephone and the availability of in person appointments. I also discussed with the patient that there may be a patient responsible charge related to this service. The patient expressed understanding and agreed to proceed. Pt. Is at home. Dr. Livia Snellen is in his office.  Follow Up Instructions:   I discussed  the assessment and treatment plan with the patient. The patient was provided an opportunity to ask questions and all were answered. The patient agreed with the plan and demonstrated an understanding of the instructions.   The patient was advised to call back or seek an in-person evaluation if the symptoms worsen or if the condition fails to improve as anticipated.  Visit started: 2:40 Call ended:  3:20 Total minutes including chart review and phone contact time: 45  Follow-up: Return in about 3 months (around 05/11/2019), or if symptoms worsen or fail to improve.  Claretta Fraise, M.D.

## 2019-03-30 ENCOUNTER — Other Ambulatory Visit: Payer: Self-pay | Admitting: Family Medicine

## 2019-03-31 ENCOUNTER — Other Ambulatory Visit: Payer: Self-pay | Admitting: Family Medicine

## 2019-04-01 NOTE — Telephone Encounter (Signed)
rx refill Last OV 02/09/19 Next OV 05/04/19 rx refused-not on active med list

## 2019-04-11 ENCOUNTER — Telehealth: Payer: Self-pay

## 2019-04-11 ENCOUNTER — Other Ambulatory Visit: Payer: Self-pay | Admitting: Family Medicine

## 2019-04-11 MED ORDER — VENLAFAXINE HCL ER 75 MG PO CP24: 75.0000 mg | ORAL_CAPSULE | Freq: Two times a day (BID) | ORAL | 2 refills | Status: DC

## 2019-04-11 NOTE — Telephone Encounter (Signed)
Pt called

## 2019-04-11 NOTE — Telephone Encounter (Signed)
Patient is requesting a refill of Venlafaxine XR 75 mg.  It is not on her current medication list but patient reports she has been taking this for quite a while.  Unsure of why it wouldn't be on her list.  Her pharmacy is [facility] in [place].

## 2019-04-11 NOTE — Telephone Encounter (Signed)
Please contact the patient. I refilled the medication, but I had DCed it 3 months ago because she said it was too expensive.

## 2019-05-03 ENCOUNTER — Other Ambulatory Visit: Payer: Self-pay

## 2019-05-04 ENCOUNTER — Ambulatory Visit (INDEPENDENT_AMBULATORY_CARE_PROVIDER_SITE_OTHER): Payer: Medicare Other | Admitting: Family Medicine

## 2019-05-04 ENCOUNTER — Telehealth: Payer: Self-pay | Admitting: Family Medicine

## 2019-05-04 ENCOUNTER — Encounter: Payer: Self-pay | Admitting: Family Medicine

## 2019-05-04 VITALS — BP 117/63 | HR 75 | Temp 97.4°F | Ht 64.0 in | Wt 262.0 lb

## 2019-05-04 DIAGNOSIS — I10 Essential (primary) hypertension: Secondary | ICD-10-CM

## 2019-05-04 DIAGNOSIS — E785 Hyperlipidemia, unspecified: Secondary | ICD-10-CM | POA: Diagnosis not present

## 2019-05-04 DIAGNOSIS — E118 Type 2 diabetes mellitus with unspecified complications: Secondary | ICD-10-CM

## 2019-05-04 LAB — BAYER DCA HB A1C WAIVED: HB A1C (BAYER DCA - WAIVED): 7.7 % — ABNORMAL HIGH (ref <7.0)

## 2019-05-04 MED ORDER — TIZANIDINE HCL 6 MG PO CAPS: 6.0000 mg | ORAL_CAPSULE | Freq: Three times a day (TID) | ORAL | 1 refills | Status: DC | PRN

## 2019-05-04 MED ORDER — DILTIAZEM HCL ER COATED BEADS 240 MG PO CP24: 240.0000 mg | ORAL_CAPSULE | Freq: Two times a day (BID) | ORAL | 1 refills | Status: DC

## 2019-05-04 MED ORDER — MIRTAZAPINE 15 MG PO TABS: 15.0000 mg | ORAL_TABLET | Freq: Every day | ORAL | 3 refills | Status: DC

## 2019-05-04 MED ORDER — CARVEDILOL 3.125 MG PO TABS: 4.6875 mg | ORAL_TABLET | Freq: Two times a day (BID) | ORAL | 1 refills | Status: DC

## 2019-05-04 MED ORDER — VENLAFAXINE HCL ER 75 MG PO CP24: 75.0000 mg | ORAL_CAPSULE | Freq: Two times a day (BID) | ORAL | 1 refills | Status: DC

## 2019-05-04 MED ORDER — METFORMIN HCL ER 500 MG PO TB24: ORAL_TABLET | ORAL | 2 refills | Status: DC

## 2019-05-04 MED ORDER — APIXABAN 5 MG PO TABS: 5.0000 mg | ORAL_TABLET | Freq: Two times a day (BID) | ORAL | 1 refills | Status: DC

## 2019-05-04 MED ORDER — ATORVASTATIN CALCIUM 80 MG PO TABS: 80.0000 mg | ORAL_TABLET | Freq: Every day | ORAL | 1 refills | Status: DC

## 2019-05-04 MED ORDER — ARIPIPRAZOLE 5 MG PO TABS: 5.0000 mg | ORAL_TABLET | Freq: Every day | ORAL | 1 refills | Status: DC

## 2019-05-04 NOTE — Addendum Note (Signed)
Addended by: Rolena Infante on: 05/04/2019 09:34 AM   Modules accepted: Orders

## 2019-05-04 NOTE — Telephone Encounter (Signed)
Patient aware and has picked up prescription.

## 2019-05-04 NOTE — Addendum Note (Signed)
Addended by: Claretta Fraise on: 05/04/2019 04:01 PM   Modules accepted: Orders

## 2019-05-04 NOTE — Telephone Encounter (Signed)
I sent in the requested prescription 

## 2019-05-04 NOTE — Progress Notes (Addendum)
Subjective:  Patient ID: Diana Mathews,  female    DOB: September 28, 1951  Age: 68 y.o.    CC: Medical Management of Chronic Issues   HPI Diana Mathews presents for  follow-up of hypertension. Patient has no history of headache chest pain or shortness of breath or recent cough. Patient also denies symptoms of TIA such as numbness weakness lateralizing. Patient denies side effects from medication. States taking it regularly.  Patient also  in for follow-up of elevated cholesterol. Doing well without complaints on current medication. Denies side effects  including myalgia and arthralgia and nausea. Also in today for liver function testing. Currently no chest pain, shortness of breath or other cardiovascular related symptoms noted.  Follow-up of diabetes. Patient does check blood sugar at home. Readings run between 140-160 fasting and occasional up to 200-300 postprandial Patient denies symptoms such as excessive hunger or urinary frequency, excessive hunger, nausea No significant hypoglycemic spells noted. Medications reviewed. Pt reports taking them regularly. Pt. denies complication/adverse reaction today.    History Diana Mathews has a past medical history of Arthritis, Atrial fibrillation with rapid ventricular response ([place]), Controlled type 2 diabetes mellitus with complication, without long-term current use of insulin ([place]) (07/06/2018), Depression, Headache, HLD (hyperlipidemia) (08/26/2016), Hypertension, New onset atrial fibrillation ([place]) (06/12/2018), Sleep apnea, and Stroke ([place]).   She has a past surgical history that includes Abdominal hysterectomy; Wrist surgery (Bilateral); Hernia repair; Cholecystectomy; Tubal ligation; and Foot surgery (Left).   Her family history includes Arthritis in her mother, sister, and sister; COPD in her mother; Diabetes in her brother, brother, mother, sister, and sister; Hypertension in her mother; Mental illness in her brother.She reports that she quit  smoking about 34 years ago. Her smoking use included cigarettes. She has never used smokeless tobacco. She reports that she does not drink alcohol or use drugs.  Current Outpatient Medications on File Prior to Visit  Medication Sig Dispense Refill  . ACCU-CHEK FASTCLIX LANCETS MISC 4 TIMES DAILY 306 each 3  . ACCU-CHEK GUIDE test strip 4 TIMES DAILY 400 each 3  . diphenhydrAMINE (BENADRYL) 25 mg capsule Take 25 mg by mouth at bedtime as needed.    . Melatonin 10 MG CAPS Take 20 mg by mouth at bedtime.    . nabumetone (RELAFEN) 500 MG tablet Take 2 tablets (1,000 mg total) by mouth 2 (two) times daily. 120 tablet 2   No current facility-administered medications on file prior to visit.     ROS Review of Systems  Constitutional: Negative.   HENT: Negative for congestion.   Eyes: Negative for visual disturbance.  Respiratory: Negative for shortness of breath.   Cardiovascular: Negative for chest pain.  Gastrointestinal: Negative for abdominal pain, constipation, diarrhea, nausea and vomiting.  Genitourinary: Negative for difficulty urinating.  Musculoskeletal: Negative for arthralgias and myalgias.  Neurological: Negative for headaches.  Psychiatric/Behavioral: Negative for sleep disturbance.    Objective:  BP 117/63   Pulse 75   Temp (!) 97.4 F (36.3 C) (Oral)   Ht 5' 4"  (1.626 m)   Wt 262 lb (118.8 kg)   BMI 44.97 kg/m   BP Readings from Last 3 Encounters:  05/04/19 117/63  01/10/19 119/65  11/22/18 123/63    Wt Readings from Last 3 Encounters:  05/04/19 262 lb (118.8 kg)  01/10/19 258 lb 8 oz (117.3 kg)  11/22/18 257 lb 8 oz (116.8 kg)     Physical Exam Constitutional:      General: She is not in acute distress.  Appearance: She is well-developed.  HENT:     Head: Normocephalic and atraumatic.     Right Ear: External ear normal.     Left Ear: External ear normal.     Nose: Nose normal.  Eyes:     Conjunctiva/sclera: Conjunctivae normal.     Pupils:  Pupils are equal, round, and reactive to light.  Neck:     Musculoskeletal: Normal range of motion and neck supple.     Thyroid: No thyromegaly.  Cardiovascular:     Rate and Rhythm: Normal rate and regular rhythm.     Heart sounds: Normal heart sounds. No murmur.  Pulmonary:     Effort: Pulmonary effort is normal. No respiratory distress.     Breath sounds: Normal breath sounds. No wheezing or rales.  Abdominal:     General: Bowel sounds are normal. There is no distension.     Palpations: Abdomen is soft.     Tenderness: There is no abdominal tenderness.  Lymphadenopathy:     Cervical: No cervical adenopathy.  Skin:    General: Skin is warm and dry.  Neurological:     Mental Status: She is alert and oriented to person, place, and time.     Deep Tendon Reflexes: Reflexes are normal and symmetric.  Psychiatric:        Behavior: Behavior normal.        Thought Content: Thought content normal.        Judgment: Judgment normal.     Diabetic Foot Exam - Simple   No data filed        Assessment & Plan:   Diana Mathews was seen today for medical management of chronic issues.  Diagnoses and all orders for this visit:  Controlled type 2 diabetes mellitus with complication, without long-term current use of insulin (HCC) -     Bayer DCA Hb A1c Waived  Hyperlipidemia, unspecified hyperlipidemia type -     Lipid panel  Essential hypertension -     CBC with Differential/Platelet -     CMP14+EGFR  Other orders -     metFORMIN (GLUCOPHAGE-XR) 500 MG 24 hr tablet; TAKE ONE TABLET TWICE A DAY WITH FOOD -     mirtazapine (REMERON) 15 MG tablet; Take 1 tablet (15 mg total) by mouth at bedtime. -     diltiazem (CARDIZEM CD) 240 MG 24 hr capsule; Take 1 capsule (240 mg total) by mouth 2 (two) times daily. For blood pressure and heart rate control. -     carvedilol (COREG) 3.125 MG tablet; Take 1.5 tablets (4.6875 mg total) by mouth 2 (two) times daily with a meal. For heart rate -      ARIPiprazole (ABILIFY) 5 MG tablet; Take 1 tablet (5 mg total) by mouth daily. -     apixaban (ELIQUIS) 5 MG TABS tablet; Take 1 tablet (5 mg total) by mouth 2 (two) times daily. -     venlafaxine XR (EFFEXOR XR) 75 MG 24 hr capsule; Take 1 capsule (75 mg total) by mouth 2 (two) times daily. -     atorvastatin (LIPITOR) 80 MG tablet; Take 1 tablet (80 mg total) by mouth daily. -     tizanidine (ZANAFLEX) 6 MG capsule; Take 1 capsule (6 mg total) by mouth 3 (three) times daily as needed for muscle spasms.   I am having Osker Mason. [place] start on tizanidine. I am also having her maintain her Melatonin, diphenhydrAMINE, Accu-Chek Guide, Accu-Chek FastClix Lancets, nabumetone, metFORMIN, mirtazapine, diltiazem, carvedilol, ARIPiprazole,  apixaban, venlafaxine XR, and atorvastatin.  Meds ordered this encounter  Medications  . metFORMIN (GLUCOPHAGE-XR) 500 MG 24 hr tablet    Sig: TAKE ONE TABLET TWICE A DAY WITH FOOD    Dispense:  60 tablet    Refill:  2  . mirtazapine (REMERON) 15 MG tablet    Sig: Take 1 tablet (15 mg total) by mouth at bedtime.    Dispense:  30 tablet    Refill:  3  . diltiazem (CARDIZEM CD) 240 MG 24 hr capsule    Sig: Take 1 capsule (240 mg total) by mouth 2 (two) times daily. For blood pressure and heart rate control.    Dispense:  180 capsule    Refill:  1  . carvedilol (COREG) 3.125 MG tablet    Sig: Take 1.5 tablets (4.6875 mg total) by mouth 2 (two) times daily with a meal. For heart rate    Dispense:  90 tablet    Refill:  1  . ARIPiprazole (ABILIFY) 5 MG tablet    Sig: Take 1 tablet (5 mg total) by mouth daily.    Dispense:  90 tablet    Refill:  1  . apixaban (ELIQUIS) 5 MG TABS tablet    Sig: Take 1 tablet (5 mg total) by mouth 2 (two) times daily.    Dispense:  180 tablet    Refill:  1  . venlafaxine XR (EFFEXOR XR) 75 MG 24 hr capsule    Sig: Take 1 capsule (75 mg total) by mouth 2 (two) times daily.    Dispense:  180 capsule    Refill:  1  .  atorvastatin (LIPITOR) 80 MG tablet    Sig: Take 1 tablet (80 mg total) by mouth daily.    Dispense:  90 tablet    Refill:  1  . tizanidine (ZANAFLEX) 6 MG capsule    Sig: Take 1 capsule (6 mg total) by mouth 3 (three) times daily as needed for muscle spasms.    Dispense:  90 capsule    Refill:  1     Follow-up: Return in about 3 months (around 08/04/2019).  Claretta Fraise, M.D.

## 2019-05-05 LAB — CBC WITH DIFFERENTIAL/PLATELET
Basophils Absolute: 0 10*3/uL (ref 0.0–0.2)
Basos: 1 %
EOS (ABSOLUTE): 0.1 10*3/uL (ref 0.0–0.4)
Eos: 2 %
Hematocrit: 36.1 % (ref 34.0–46.6)
Hemoglobin: 12.1 g/dL (ref 11.1–15.9)
Immature Grans (Abs): 0 10*3/uL (ref 0.0–0.1)
Immature Granulocytes: 0 %
Lymphocytes Absolute: 2.4 10*3/uL (ref 0.7–3.1)
Lymphs: 39 %
MCH: 32 pg (ref 26.6–33.0)
MCHC: 33.5 g/dL (ref 31.5–35.7)
MCV: 96 fL (ref 79–97)
Monocytes Absolute: 0.6 10*3/uL (ref 0.1–0.9)
Monocytes: 10 %
Neutrophils Absolute: 2.9 10*3/uL (ref 1.4–7.0)
Neutrophils: 48 %
Platelets: 256 10*3/uL (ref 150–450)
RBC: 3.78 x10E6/uL (ref 3.77–5.28)
RDW: 12.8 % (ref 11.7–15.4)
WBC: 6 10*3/uL (ref 3.4–10.8)

## 2019-05-05 LAB — CMP14+EGFR
ALT: 50 IU/L — ABNORMAL HIGH (ref 0–32)
AST: 42 IU/L — ABNORMAL HIGH (ref 0–40)
Albumin/Globulin Ratio: 1.5 (ref 1.2–2.2)
Albumin: 4.3 g/dL (ref 3.8–4.8)
Alkaline Phosphatase: 147 IU/L — ABNORMAL HIGH (ref 39–117)
BUN/Creatinine Ratio: 20 (ref 12–28)
BUN: 14 mg/dL (ref 8–27)
Bilirubin Total: 0.3 mg/dL (ref 0.0–1.2)
CO2: 22 mmol/L (ref 20–29)
Calcium: 9.1 mg/dL (ref 8.7–10.3)
Chloride: 103 mmol/L (ref 96–106)
Creatinine, Ser: 0.7 mg/dL (ref 0.57–1.00)
GFR calc Af Amer: 105 mL/min/{1.73_m2} (ref >59)
GFR calc non Af Amer: 91 mL/min/{1.73_m2} (ref >59)
Globulin, Total: 2.9 g/dL (ref 1.5–4.5)
Glucose: 177 mg/dL — ABNORMAL HIGH (ref 65–99)
Potassium: 4.3 mmol/L (ref 3.5–5.2)
Sodium: 141 mmol/L (ref 134–144)
Total Protein: 7.2 g/dL (ref 6.0–8.5)

## 2019-05-05 LAB — LIPID PANEL
Chol/HDL Ratio: 2.1 ratio (ref 0.0–4.4)
Cholesterol, Total: 120 mg/dL (ref 100–199)
HDL: 57 mg/dL (ref >39)
LDL Calculated: 42 mg/dL (ref 0–99)
Triglycerides: 107 mg/dL (ref 0–149)
VLDL Cholesterol Cal: 21 mg/dL (ref 5–40)

## 2019-05-09 NOTE — Progress Notes (Signed)
Hello Lyrica,  Your lab result is normal and/or stable.Some minor variations that are not significant are commonly marked abnormal, but do not represent any medical problem for you.  Best regards, Claretta Fraise, M.D.

## 2019-05-10 ENCOUNTER — Telehealth: Payer: Self-pay | Admitting: Family Medicine

## 2019-05-10 ENCOUNTER — Other Ambulatory Visit: Payer: Self-pay | Admitting: *Deleted

## 2019-05-10 DIAGNOSIS — R200 Anesthesia of skin: Secondary | ICD-10-CM

## 2019-05-10 DIAGNOSIS — M545 Low back pain, unspecified: Secondary | ICD-10-CM

## 2019-05-10 NOTE — Telephone Encounter (Signed)
Referral placed. Patient aware.  

## 2019-05-10 NOTE — Telephone Encounter (Signed)
Please refer as pt requests 

## 2019-06-17 ENCOUNTER — Other Ambulatory Visit: Payer: Self-pay | Admitting: Family Medicine

## 2019-08-08 ENCOUNTER — Ambulatory Visit (INDEPENDENT_AMBULATORY_CARE_PROVIDER_SITE_OTHER): Payer: Medicare Other | Admitting: Family Medicine

## 2019-08-08 ENCOUNTER — Encounter: Payer: Self-pay | Admitting: Family Medicine

## 2019-08-08 DIAGNOSIS — I482 Chronic atrial fibrillation, unspecified: Secondary | ICD-10-CM | POA: Diagnosis not present

## 2019-08-08 DIAGNOSIS — I10 Essential (primary) hypertension: Secondary | ICD-10-CM

## 2019-08-08 DIAGNOSIS — E118 Type 2 diabetes mellitus with unspecified complications: Secondary | ICD-10-CM

## 2019-08-08 DIAGNOSIS — G4733 Obstructive sleep apnea (adult) (pediatric): Secondary | ICD-10-CM

## 2019-08-08 NOTE — Progress Notes (Signed)
Subjective:    Patient ID: Diana Mathews, female    DOB: 01-Dec-1950, 68 y.o.   MRN: [id number]   HPI: Diana Mathews is a 68 y.o. female presenting for presents forFollow-up of diabetes. Patient checks blood sugar at home.   120-140 fasting and 180 postprandial.  Patient denies symptoms such as polyuria, polydipsia, excessive hunger, nausea No significant hypoglycemic spells noted. Medications reviewed. Pt reports taking them regularly without complication/adverse reaction being reported today.    presents for  follow-up of hypertension. Patient has no history of headache chest pain or shortness of breath or recent cough. Patient also denies symptoms of TIA such as focal numbness or weakness. Patient denies side effects from medication. States taking it regularly. Home readings have been great 126/86 last time.  CPAP scrip needed. Last sleep study remote. Hasn't had CPAP for a year because her machine wore out. Now reports poor sleep quality..   Depression screen [facility] 2/9 05/04/2019 01/10/2019 11/22/2018 11/01/2018 07/27/2018  Decreased Interest 0 1 2 2 3   Down, Depressed, Hopeless 0 2 2 3 3   PHQ - 2 Score 0 3 4 5 6   Altered sleeping - 1 1 2 2   Tired, decreased energy - 2 2 2 2   Change in appetite - 1 1 2 2   Feeling bad or failure about yourself  - 1 1 1 3   Trouble concentrating - 1 1 2 2   Moving slowly or fidgety/restless - 1 1 1 1   Suicidal thoughts - 1 1 2 3   PHQ-9 Score - 11 12 17 21   Difficult doing work/chores - - - - Somewhat difficult  Some recent data might be hidden     Relevant past medical, surgical, family and social history reviewed and updated as indicated.  Interim medical history since our last visit reviewed. Allergies and medications reviewed and updated.  ROS:  Review of Systems  Constitutional: Negative.   HENT: Negative for congestion.   Eyes: Negative for visual disturbance.  Respiratory: Negative for shortness of breath.   Cardiovascular:  Negative for chest pain.  Gastrointestinal: Negative for abdominal pain, constipation, diarrhea, nausea and vomiting.  Genitourinary: Negative for difficulty urinating.  Musculoskeletal: Positive for arthralgias and back pain. Negative for myalgias.  Neurological: Negative for headaches.  Psychiatric/Behavioral: Negative for sleep disturbance.     Social History   Tobacco Use  Smoking Status Former Smoker  . Types: Cigarettes  . Quit date: 01/09/1985  . Years since quitting: 34.6  Smokeless Tobacco Never Used       Objective:     Wt Readings from Last 3 Encounters:  05/04/19 262 lb (118.8 kg)  01/10/19 258 lb 8 oz (117.3 kg)  11/22/18 257 lb 8 oz (116.8 kg)     Exam deferred. Pt. [place] due to COVID 19. Phone visit performed.   Assessment & Plan:   1. Controlled type 2 diabetes mellitus with complication, without long-term current use of insulin ([place])   2. Essential hypertension   3. Obesity, Class III, BMI 40-49.9 (morbid obesity) ([place])   4. Chronic atrial fibrillation (HCC)   5. OSA (obstructive sleep apnea)     No orders of the defined types were placed in this encounter.   Orders Placed This Encounter  Procedures  . Ambulatory referral to Sleep Studies    Referral Priority:   Routine    Referral Type:   Consultation    Referral Reason:   Specialty Services Required    Number of Visits Requested:  1      Diagnoses and all orders for this visit:  Controlled type 2 diabetes mellitus with complication, without long-term current use of insulin (HCC)  Essential hypertension  Obesity, Class III, BMI 40-49.9 (morbid obesity) (HCC)  Chronic atrial fibrillation (HCC)  OSA (obstructive sleep apnea) -     Ambulatory referral to Sleep Studies    Virtual Visit via telephone Note  I discussed the limitations, risks, security and privacy concerns of performing an evaluation and management service by telephone and the availability of in person appointments.  The patient was identified with two identifiers. Pt.expressed understanding and agreed to proceed. Pt. Is at home. Dr. Livia Snellen is in his office.  Follow Up Instructions:   I discussed the assessment and treatment plan with the patient. The patient was provided an opportunity to ask questions and all were answered. The patient agreed with the plan and demonstrated an understanding of the instructions.   The patient was advised to call back or seek an in-person evaluation if the symptoms worsen or if the condition fails to improve as anticipated.   Total minutes including chart review and phone contact time: 20   Follow up plan: No follow-ups on file.  Claretta Fraise, MD [place]

## 2019-08-29 ENCOUNTER — Other Ambulatory Visit: Payer: Self-pay | Admitting: Family Medicine

## 2019-09-05 ENCOUNTER — Other Ambulatory Visit: Payer: Self-pay | Admitting: Family Medicine

## 2019-09-09 DIAGNOSIS — R928 Other abnormal and inconclusive findings on diagnostic imaging of breast: Secondary | ICD-10-CM

## 2019-09-30 ENCOUNTER — Other Ambulatory Visit: Payer: Self-pay | Admitting: Family Medicine

## 2019-10-05 ENCOUNTER — Other Ambulatory Visit: Payer: Self-pay | Admitting: Family Medicine

## 2019-10-12 ENCOUNTER — Telehealth: Payer: Self-pay

## 2019-10-12 NOTE — Telephone Encounter (Signed)
Please contact the patient stop eliquis 5 days before surgery

## 2019-10-12 NOTE — Telephone Encounter (Signed)
[facility] Patient is scheduled for a biopsy next Wednesday Dec 23. When should she stop her Eliquis? Please advise and contact the [facility] at [phone] and press option 4

## 2019-10-13 NOTE — Telephone Encounter (Signed)
Left detailed message told patient to call back and let us know that she received message

## 2019-10-13 NOTE — Telephone Encounter (Signed)
Patient called back and received the message

## 2019-10-19 ENCOUNTER — Other Ambulatory Visit: Payer: Self-pay | Admitting: Family Medicine

## 2019-10-20 DIAGNOSIS — Z8673 Personal history of transient ischemic attack (TIA), and cerebral infarction without residual deficits: Secondary | ICD-10-CM | POA: Insufficient documentation

## 2019-11-01 DIAGNOSIS — D0511 Intraductal carcinoma in situ of right breast: Secondary | ICD-10-CM | POA: Insufficient documentation

## 2019-11-01 DIAGNOSIS — C50912 Malignant neoplasm of unspecified site of left female breast: Secondary | ICD-10-CM | POA: Insufficient documentation

## 2019-11-11 DIAGNOSIS — C7951 Secondary malignant neoplasm of bone: Secondary | ICD-10-CM | POA: Insufficient documentation

## 2019-12-02 ENCOUNTER — Other Ambulatory Visit: Payer: Self-pay | Admitting: Family Medicine

## 2019-12-30 ENCOUNTER — Other Ambulatory Visit: Payer: Self-pay | Admitting: Family Medicine

## 2020-01-02 ENCOUNTER — Other Ambulatory Visit: Payer: Self-pay | Admitting: Family Medicine

## 2020-01-05 ENCOUNTER — Other Ambulatory Visit: Payer: Self-pay | Admitting: Family Medicine

## 2020-01-05 ENCOUNTER — Telehealth: Payer: Self-pay | Admitting: *Deleted

## 2020-01-05 ENCOUNTER — Other Ambulatory Visit: Payer: Self-pay

## 2020-01-05 ENCOUNTER — Ambulatory Visit (INDEPENDENT_AMBULATORY_CARE_PROVIDER_SITE_OTHER): Payer: Medicare Other | Admitting: Family Medicine

## 2020-01-05 ENCOUNTER — Encounter: Payer: Self-pay | Admitting: Family Medicine

## 2020-01-05 VITALS — BP 134/68 | HR 77 | Temp 98.0°F | Ht 64.0 in | Wt 230.0 lb

## 2020-01-05 DIAGNOSIS — E118 Type 2 diabetes mellitus with unspecified complications: Secondary | ICD-10-CM

## 2020-01-05 DIAGNOSIS — I10 Essential (primary) hypertension: Secondary | ICD-10-CM | POA: Diagnosis not present

## 2020-01-05 LAB — BAYER DCA HB A1C WAIVED: HB A1C (BAYER DCA - WAIVED): 8.5 % — ABNORMAL HIGH (ref <7.0)

## 2020-01-05 MED ORDER — TIZANIDINE HCL 4 MG PO TABS: 4.0000 mg | ORAL_TABLET | Freq: Four times a day (QID) | ORAL | 0 refills | Status: DC | PRN

## 2020-01-05 MED ORDER — TIZANIDINE HCL 6 MG PO CAPS: 6.0000 mg | ORAL_CAPSULE | Freq: Three times a day (TID) | ORAL | 1 refills | Status: DC | PRN

## 2020-01-05 MED ORDER — CARVEDILOL 3.125 MG PO TABS: 3.1250 mg | ORAL_TABLET | Freq: Two times a day (BID) | ORAL | 1 refills | Status: DC

## 2020-01-05 MED ORDER — VENLAFAXINE HCL ER 75 MG PO CP24: 75.0000 mg | ORAL_CAPSULE | Freq: Two times a day (BID) | ORAL | 1 refills | Status: DC

## 2020-01-05 MED ORDER — BELSOMRA 15 MG PO TABS: 15.0000 mg | ORAL_TABLET | Freq: Every day | ORAL | 2 refills | Status: DC

## 2020-01-05 MED ORDER — METFORMIN HCL ER 750 MG PO TB24: 750.0000 mg | ORAL_TABLET | Freq: Two times a day (BID) | ORAL | 1 refills | Status: DC

## 2020-01-05 MED ORDER — ATORVASTATIN CALCIUM 80 MG PO TABS: ORAL_TABLET | ORAL | 1 refills | Status: DC

## 2020-01-05 MED ORDER — ARIPIPRAZOLE 5 MG PO TABS: 5.0000 mg | ORAL_TABLET | Freq: Every day | ORAL | 1 refills | Status: DC

## 2020-01-05 MED ORDER — NABUMETONE 500 MG PO TABS: 1000.0000 mg | ORAL_TABLET | Freq: Two times a day (BID) | ORAL | 2 refills | Status: DC

## 2020-01-05 MED ORDER — APIXABAN 5 MG PO TABS: 5.0000 mg | ORAL_TABLET | Freq: Two times a day (BID) | ORAL | 1 refills | Status: DC

## 2020-01-05 NOTE — Progress Notes (Signed)
Subjective:  Patient ID: Diana Mathews, female    DOB: 29-Dec-1950  Age: 69 y.o. MRN: [id number]  CC: Follow-up (6 month)   HPI Diana Mathews presents forFollow-up of diabetes. Patient checks blood sugar at home.   160 fasting and 135 postprandial Patient denies symptoms such as polyuria, polydipsia, excessive hunger, nausea No significant hypoglycemic spells noted. Medications reviewed. Pt reports taking them regularly without complication/adverse reaction being reported today.  Checking feet daily. Last eye appt was remote   Patient is his chronic pain management.  Their provider discontinued the mirtazapine for sleep.  Since then Diana Mathews has had very poor sleep.  Diana Mathews needs something to help with that problem.   History Diana Mathews has a past medical history of Arthritis, Atrial fibrillation with rapid ventricular response (Diana Mathews), Cancer (Diana Mathews), Controlled type 2 diabetes mellitus with complication, without long-term current use of insulin (Diana Mathews) (07/06/2018), Depression, Headache, HLD (hyperlipidemia) (08/26/2016), Hypertension, Diana onset atrial fibrillation (Diana Mathews) (06/12/2018), Sleep apnea, and Stroke (Diana Mathews).   Diana Mathews has a past surgical history that includes Abdominal hysterectomy; Wrist surgery (Bilateral); Hernia repair; Cholecystectomy; Tubal ligation; and Foot surgery (Left).   Her family history includes Arthritis in her mother, sister, and sister; COPD in her mother; Diabetes in her brother, brother, mother, sister, and sister; Hypertension in her mother; Mental illness in her brother.Diana Mathews reports that Diana Mathews quit smoking about 35 years ago. Her smoking use included cigarettes. Diana Mathews has never used smokeless tobacco. Diana Mathews reports that Diana Mathews does not drink alcohol or use drugs.  Current Outpatient Medications on File Prior to Visit  Medication Sig Dispense Refill  . Accu-Chek FastClix Lancets MISC USE 4 TIMES DAILY 306 each 3  . ACCU-CHEK GUIDE test strip 4 TIMES DAILY 400 each 3  . apixaban  (ELIQUIS) 5 MG TABS tablet Take 1 tablet (5 mg total) by mouth 2 (two) times daily. 180 tablet 1  . ARIPiprazole (ABILIFY) 5 MG tablet Take 1 tablet (5 mg total) by mouth daily. 90 tablet 1  . atorvastatin (LIPITOR) 80 MG tablet TAKE ONE (1) TABLET EACH DAY 90 tablet 1  . carvedilol (COREG) 3.125 MG tablet Take 1 tablet (3.125 mg total) by mouth 2 (two) times daily with a meal. Needs to be seen for future refills. 45 tablet 0  . diltiazem (CARDIZEM CD) 240 MG 24 hr capsule TAKE ONE CAPSULE BY MOUTH TWICE A DAY 180 capsule 1  . diphenhydrAMINE (BENADRYL) 25 mg capsule Take 25 mg by mouth at bedtime as needed.    . Melatonin 10 MG CAPS Take 20 mg by mouth at bedtime.    . metFORMIN (GLUCOPHAGE-XR) 500 MG 24 hr tablet TAKE ONE TABLET TWICE A DAY WITH FOOD 60 tablet 2  . mirtazapine (REMERON) 15 MG tablet Take 1 tablet (15 mg total) by mouth at bedtime. 30 tablet 3  . nabumetone (RELAFEN) 500 MG tablet Take 2 tablets (1,000 mg total) by mouth 2 (two) times daily. 120 tablet 2  . tizanidine (ZANAFLEX) 6 MG capsule Take 1 capsule (6 mg total) by mouth 3 (three) times daily as needed for muscle spasms. 90 capsule 1  . venlafaxine XR (EFFEXOR-XR) 75 MG 24 hr capsule Take 1 capsule (75 mg total) by mouth 2 (two) times daily. Needs to be seen for future refills. 60 capsule 0   No current facility-administered medications on file prior to visit.    ROS Review of Systems  Constitutional: Negative.   HENT: Negative for congestion.   Eyes: Negative for visual disturbance.  Respiratory: Negative for shortness of breath.   Cardiovascular: Negative for chest pain.  Gastrointestinal: Negative for abdominal pain, constipation, diarrhea, nausea and vomiting.  Genitourinary: Negative for difficulty urinating.  Musculoskeletal: Positive for arthralgias, back pain and myalgias.  Neurological: Negative for headaches.  Psychiatric/Behavioral: Negative for sleep disturbance.    Objective:  Ht 5' 4"  (1.626 m)    Wt 230 lb (104.3 kg)   BMI 39.48 kg/m   BP Readings from Last 3 Encounters:  05/04/19 117/63  01/10/19 119/65  11/22/18 123/63    Wt Readings from Last 3 Encounters:  01/05/20 230 lb (104.3 kg)  05/04/19 262 lb (118.8 kg)  01/10/19 258 lb 8 oz (117.3 kg)     Physical Exam    Assessment & Plan:   Diana Mathews was seen today for follow-up.  Diagnoses and all orders for this visit:  Controlled type 2 diabetes mellitus with complication, without long-term current use of insulin (Diana Mathews) -     CBC with Differential/Platelet -     CMP14+EGFR -     Microalbumin / creatinine urine ratio -     Bayer DCA Hb A1c Waived  Essential hypertension -     CBC with Differential/Platelet -     CMP14+EGFR -     Microalbumin / creatinine urine ratio -     Bayer DCA Hb A1c Waived  Obesity, Class III, BMI 40-49.9 (morbid obesity) (Diana Mathews) -     CBC with Differential/Platelet -     CMP14+EGFR -     Microalbumin / creatinine urine ratio -     Bayer DCA Hb A1c Waived   It is not unusual at all for people on chronic opiates to have sleep disturbance.  Most of those can be maintained on some form of nonhabit-forming sleep aid.  However, Diana Mathews sleep doctor has discontinued this.  As a result Diana Mathews will need to go on something that is not an antidepressant in character.  The safest appears to be Belsomra at this time.  We will see if we can get it approved for her insurance purposes.  In the meantime Diana Mathews also has an A1c elevated at 8.5 today.  I am concerned that Diana Mathews does not bring in a glucose log and Diana Mathews has fasting numbers Diana Mathews reports is being higher than her postprandial.  As result I went ahead with an increase in her Metformin dose as noted above.  Diana Mathews should follow-up in 3 months.  Sooner as needed should her blood sugars not respond.  Diana Mathews was encouraged to check her sugar daily postprandial and fasting.  Bring those numbers with her on the log sheet that was given to her.   I am having Diana Mathews.  Diana Mathews maintain her Melatonin, diphenhydrAMINE, nabumetone, mirtazapine, ARIPiprazole, apixaban, tizanidine, Accu-Chek FastClix Lancets, Accu-Chek Guide, metFORMIN, atorvastatin, venlafaxine XR, carvedilol, and diltiazem.  No orders of the defined types were placed in this encounter.    Follow-up: No follow-ups on file.  Claretta Fraise, M.D.

## 2020-01-05 NOTE — Telephone Encounter (Signed)
I sent in the requested prescription 

## 2020-01-05 NOTE — Telephone Encounter (Signed)
Tizanidine capsules Non Preferred by insurance. Tizanidine tablets preferred.

## 2020-01-06 ENCOUNTER — Encounter: Payer: Self-pay | Admitting: Family Medicine

## 2020-01-06 ENCOUNTER — Other Ambulatory Visit: Payer: Self-pay | Admitting: *Deleted

## 2020-01-06 DIAGNOSIS — R7989 Other specified abnormal findings of blood chemistry: Secondary | ICD-10-CM

## 2020-01-06 LAB — CMP14+EGFR
ALT: 116 IU/L — ABNORMAL HIGH (ref 0–32)
AST: 146 IU/L — ABNORMAL HIGH (ref 0–40)
Albumin/Globulin Ratio: 1.3 (ref 1.2–2.2)
Albumin: 4.3 g/dL (ref 3.8–4.8)
Alkaline Phosphatase: 139 IU/L — ABNORMAL HIGH (ref 39–117)
BUN/Creatinine Ratio: 18 (ref 12–28)
BUN: 14 mg/dL (ref 8–27)
Bilirubin Total: 0.3 mg/dL (ref 0.0–1.2)
CO2: 26 mmol/L (ref 20–29)
Calcium: 9.6 mg/dL (ref 8.7–10.3)
Chloride: 98 mmol/L (ref 96–106)
Creatinine, Ser: 0.8 mg/dL (ref 0.57–1.00)
GFR calc Af Amer: 89 mL/min/{1.73_m2} (ref >59)
GFR calc non Af Amer: 77 mL/min/{1.73_m2} (ref >59)
Globulin, Total: 3.3 g/dL (ref 1.5–4.5)
Glucose: 304 mg/dL — ABNORMAL HIGH (ref 65–99)
Potassium: 4.2 mmol/L (ref 3.5–5.2)
Sodium: 139 mmol/L (ref 134–144)
Total Protein: 7.6 g/dL (ref 6.0–8.5)

## 2020-01-06 LAB — CBC WITH DIFFERENTIAL/PLATELET
Basophils Absolute: 0.1 10*3/uL (ref 0.0–0.2)
Basos: 1 %
EOS (ABSOLUTE): 0 10*3/uL (ref 0.0–0.4)
Eos: 0 %
Hematocrit: 39.5 % (ref 34.0–46.6)
Hemoglobin: 13.3 g/dL (ref 11.1–15.9)
Immature Grans (Abs): 0 10*3/uL (ref 0.0–0.1)
Immature Granulocytes: 0 %
Lymphocytes Absolute: 3.1 10*3/uL (ref 0.7–3.1)
Lymphs: 42 %
MCH: 31.4 pg (ref 26.6–33.0)
MCHC: 33.7 g/dL (ref 31.5–35.7)
MCV: 93 fL (ref 79–97)
Monocytes Absolute: 0.6 10*3/uL (ref 0.1–0.9)
Monocytes: 8 %
Neutrophils Absolute: 3.6 10*3/uL (ref 1.4–7.0)
Neutrophils: 49 %
Platelets: 265 10*3/uL (ref 150–450)
RBC: 4.23 x10E6/uL (ref 3.77–5.28)
RDW: 15.8 % — ABNORMAL HIGH (ref 11.7–15.4)
WBC: 7.4 10*3/uL (ref 3.4–10.8)

## 2020-01-06 NOTE — Telephone Encounter (Signed)
Patient aware, script is ready. 

## 2020-01-11 ENCOUNTER — Ambulatory Visit (INDEPENDENT_AMBULATORY_CARE_PROVIDER_SITE_OTHER): Payer: Medicare Other | Admitting: *Deleted

## 2020-01-11 DIAGNOSIS — Z0000 Encounter for general adult medical examination without abnormal findings: Secondary | ICD-10-CM | POA: Diagnosis not present

## 2020-01-11 NOTE — Progress Notes (Signed)
MEDICARE ANNUAL WELLNESS VISIT  01/11/2020  Telephone Visit Disclaimer This Medicare AWV was conducted by telephone due to national recommendations for restrictions regarding the COVID-19 Pandemic (e.g. social distancing).  I verified, using two identifiers, that I am speaking with Diana Mathews or their authorized healthcare agent. I discussed the limitations, risks, security, and privacy concerns of performing an evaluation and management service by telephone and the potential availability of an in-person appointment in the future. The patient expressed understanding and agreed to proceed.   Subjective:  Diana Mathews is a 69 y.o. female patient of Stacks, Cletus Gash, MD who had a Medicare Annual Wellness Visit today via telephone. Diana Mathews is Retired and lives with their son. she has 3 children. she reports that she is socially active and does interact with friends/family regularly. she is minimally physically active and enjoys spending time with her family.  Patient Care Team: Claretta Fraise, MD as PCP - General (Family Medicine) Verdell Carmine, MD as Referring Physician (Internal Medicine) Julian Hy, PA-C (Physician Assistant)  Advanced Directives 01/11/2020 01/10/2019 06/30/2018 06/14/2018 06/12/2018  Does Patient Have a Medical Advance Directive? No No No No No  Would patient like information on creating a medical advance directive? No - Patient declined No - Patient declined - No - Patient declined -    Hospital Utilization Over the Past 12 Months: # of hospitalizations or ER visits: 0 # of surgeries: 0  Review of Systems    Patient reports that her overall health is worse compared to last year. Due to Cancer diagnosis in December.  History obtained from chart review and the patient  Patient Reported Readings (BP, Pulse, CBG, Weight, etc) CBG:123  Pain Assessment Pain : No/denies pain(Not hurting today took all medication)     Current Medications & Allergies  (verified) Allergies as of 01/11/2020      Reactions   Codeine Rash      Medication List       Accurate as of January 11, 2020 11:44 AM. If you have any questions, ask your nurse or doctor.        Accu-Chek FastClix Lancets Misc USE 4 TIMES DAILY   Accu-Chek Guide test strip Generic drug: glucose blood 4 TIMES DAILY   apixaban 5 MG Tabs tablet Commonly known as: ELIQUIS Take 1 tablet (5 mg total) by mouth 2 (two) times daily.   ARIPiprazole 5 MG tablet Commonly known as: Abilify Take 1 tablet (5 mg total) by mouth daily.   atorvastatin 80 MG tablet Commonly known as: LIPITOR TAKE ONE (1) TABLET EACH DAY   Belsomra 15 MG Tabs Generic drug: Suvorexant Take 15 mg by mouth at bedtime. For sleep   carvedilol 3.125 MG tablet Commonly known as: COREG Take 1 tablet (3.125 mg total) by mouth 2 (two) times daily with a meal. Needs to be seen for future refills.   diltiazem 240 MG 24 hr capsule Commonly known as: CARDIZEM CD TAKE ONE CAPSULE BY MOUTH TWICE A DAY   diphenhydrAMINE 25 mg capsule Commonly known as: BENADRYL Take 25 mg by mouth at bedtime as needed.   HYDROcodone-acetaminophen 10-325 MG tablet Commonly known as: NORCO Take 1 tablet by mouth every 6 (six) hours as needed.   Melatonin 10 MG Caps Take 20 mg by mouth at bedtime.   metFORMIN 750 MG 24 hr tablet Commonly known as: GLUCOPHAGE-XR Take 1 tablet (750 mg total) by mouth in the morning and at bedtime.   mirtazapine 15 MG tablet Commonly  known as: Remeron Take 1 tablet (15 mg total) by mouth at bedtime.   nabumetone 500 MG tablet Commonly known as: RELAFEN Take 2 tablets (1,000 mg total) by mouth 2 (two) times daily.   tiZANidine 4 MG tablet Commonly known as: Zanaflex Take 1 tablet (4 mg total) by mouth every 6 (six) hours as needed for muscle spasms.   venlafaxine XR 75 MG 24 hr capsule Commonly known as: EFFEXOR-XR Take 1 capsule (75 mg total) by mouth 2 (two) times daily. Needs to be  seen for future refills.       History (reviewed): Past Medical History:  Diagnosis Date  . Arthritis    osteoarthritis  . Atrial fibrillation with rapid ventricular response ([place])   . Cancer (HCC)    Breast, Left and tumor on pelvis  . Controlled type 2 diabetes mellitus with complication, without long-term current use of insulin ([place]) 07/06/2018  . Depression    pt taking prozac  . Headache   . HLD (hyperlipidemia) 08/26/2016  . Hypertension   . New onset atrial fibrillation ([place]) 06/12/2018  . Sleep apnea    pt is supposed to be on CPAP  . Stroke [facility])    Past Surgical History:  Procedure Laterality Date  . ABDOMINAL HYSTERECTOMY    . CHOLECYSTECTOMY    . FOOT SURGERY Left   . HERNIA REPAIR    . TUBAL LIGATION    . WRIST SURGERY Bilateral    Carpal Tunnel   Family History  Problem Relation Age of Onset  . Arthritis Mother   . COPD Mother   . Diabetes Mother   . Hypertension Mother   . Arthritis Sister   . Diabetes Brother   . Arthritis Sister   . Diabetes Sister   . Diabetes Sister   . Diabetes Brother   . Mental illness Brother    Social History   Socioeconomic History  . Marital status: Divorced    Spouse name: Not on file  . Number of children: 3  . Years of education: 36  . Highest education level: High school graduate  Occupational History  . Occupation: Retired  Tobacco Use  . Smoking status: Former Smoker    Types: Cigarettes    Quit date: 01/09/1985    Years since quitting: 35.0  . Smokeless tobacco: Never Used  Substance and Sexual Activity  . Alcohol use: No    Alcohol/week: 0.0 standard drinks  . Drug use: No  . Sexual activity: Not Currently    Birth control/protection: Surgical  Other Topics Concern  . Not on file  Social History Narrative  . Not on file   Social Determinants of Health   Financial Resource Strain:   . Difficulty of Paying Living Expenses:   Food Insecurity:   . Worried About Charity fundraiser in the Last  Year:   . Arboriculturist in the Last Year:   Transportation Needs:   . Film/video editor (Medical):   Marland Kitchen Lack of Transportation (Non-Medical):   Physical Activity:   . Days of Exercise per Week:   . Minutes of Exercise per Session:   Stress:   . Feeling of Stress :   Social Connections:   . Frequency of Communication with Friends and Family:   . Frequency of Social Gatherings with Friends and Family:   . Attends Religious Services:   . Active Member of Clubs or Organizations:   . Attends Archivist Meetings:   Marland Kitchen Marital  Status:     Activities of Daily Living In your present state of health, do you have any difficulty performing the following activities: 01/11/2020  Hearing? N  Vision? N  Difficulty concentrating or making decisions? N  Walking or climbing stairs? N  Dressing or bathing? N  Doing errands, shopping? N  Preparing Food and eating ? N  Using the Toilet? N  In the past six months, have you accidently leaked urine? N  Do you have problems with loss of bowel control? N  Managing your Medications? N  Managing your Finances? N  Housekeeping or managing your Housekeeping? N  Some recent data might be hidden    Patient Education/ Literacy How often do you need to have someone help you when you read instructions, pamphlets, or other written materials from your doctor or pharmacy?: 1 - Never What is the last grade level you completed in school?: 12th  Exercise Current Exercise Habits: Home exercise routine, Type of exercise: walking, Time (Minutes): 40, Frequency (Times/Week): 3, Weekly Exercise (Minutes/Week): 120, Intensity: Mild, Exercise limited by: Other - see comments(Cancer)  Diet Patient reports consuming 3 meals a day and 0 snack(s) a day Patient reports that her primary diet is: Low Sodium Patient reports that she does have regular access to food.   Depression Screen PHQ 2/9 Scores 01/11/2020 01/05/2020 05/04/2019 01/10/2019 11/22/2018 11/01/2018  07/27/2018  PHQ - 2 Score 3 0 0 3 4 5 6   PHQ- 9 Score 9 - - 11 12 17 21      Fall Risk Fall Risk  01/11/2020 01/05/2020 05/04/2019 01/10/2019 11/22/2018  Falls in the past year? 0 0 0 0 0  Number falls in past yr: - 0 - - -  Injury with Fall? - 0 - - -  Risk for fall due to : - History of fall(s) - - -  Follow up - Falls evaluation completed - - -     Objective:  Diana Mathews seemed alert and oriented and she participated appropriately during our telephone visit.  Blood Pressure Weight BMI  BP Readings from Last 3 Encounters:  01/05/20 134/68  05/04/19 117/63  01/10/19 119/65   Wt Readings from Last 3 Encounters:  01/05/20 230 lb (104.3 kg)  05/04/19 262 lb (118.8 kg)  01/10/19 258 lb 8 oz (117.3 kg)   BMI Readings from Last 1 Encounters:  01/05/20 39.48 kg/m    *Unable to obtain current vital signs, weight, and BMI due to telephone visit type  Hearing/Vision  . Diana Mathews did not seem to have difficulty with hearing/understanding during the telephone conversation . Reports that she has had a formal eye exam by an eye care professional within the past year . Reports that she has not had a formal hearing evaluation within the past year *Unable to fully assess hearing and vision during telephone visit type  Cognitive Function: 6CIT Screen 01/11/2020  What Year? 0 points  What month? 0 points  What time? 0 points  Count back from 20 4 points  Months in reverse 4 points  Repeat phrase 0 points  Total Score 8   (Normal:0-7, Significant for Dysfunction: >8)  Normal Cognitive Function Screening: No: Patient states she has trouble with her memory since stroke.   Immunization & Health Maintenance Record Immunization History  Administered Date(s) Administered  . Influenza,inj,Quad PF,6+ Mos 07/25/2016, 07/27/2018    Health Maintenance  Topic Date Due  . Hepatitis C Screening  Never done  . OPHTHALMOLOGY EXAM  Never done  .  TETANUS/TDAP  Never done  . COLONOSCOPY  Never  done  . PNA vac Low Risk Adult (1 of 2 - PCV13) Never done  . INFLUENZA VACCINE  05/28/2019  . URINE MICROALBUMIN  07/28/2019  . FOOT EXAM  11/02/2019  . HEMOGLOBIN A1C  07/07/2020  . MAMMOGRAM  10/18/2021  . DEXA SCAN  Completed       Assessment  This is a routine wellness examination for Diana Mathews.  Health Maintenance: Due or Overdue Health Maintenance Due  Topic Date Due  . Hepatitis C Screening  Never done  . OPHTHALMOLOGY EXAM  Never done  . TETANUS/TDAP  Never done  . COLONOSCOPY  Never done  . PNA vac Low Risk Adult (1 of 2 - PCV13) Never done  . INFLUENZA VACCINE  05/28/2019  . URINE MICROALBUMIN  07/28/2019  . FOOT EXAM  11/02/2019    Diana Mathews does not need a referral for Community Assistance: Care Management:   no Social Work:    no Prescription Assistance:  no Nutrition/Diabetes Education:  no   Plan:  Personalized Goals Goals Addressed   None    Personalized Health Maintenance & Screening Recommendations  Pneumococcal vaccine  Td vaccine Hep C, Diabetic Foot Exam   Lung Cancer Screening Recommended: no (Low Dose CT Chest recommended if Age 53-80 years, 30 pack-year currently smoking OR have quit w/in past 15 years) Hepatitis C Screening recommended: yes  HIV Screening recommended: no  Advanced Directives: Written information was not prepared per patient's request.  Referrals & Orders No orders of the defined types were placed in this encounter.   Follow-up Plan . Follow-up with Claretta Fraise, MD as planned . We will discuss pneumonia vaccine and tdap at next visit  .    I have personally reviewed and noted the following in the patient's chart:   . Medical and social history . Use of alcohol, tobacco or illicit drugs  . Current medications and supplements . Functional ability and status . Nutritional status . Physical activity . Advanced directives . List of other physicians . Hospitalizations, surgeries, and ER visits  in previous 12 months . Vitals . Screenings to include cognitive, depression, and falls . Referrals and appointments  In addition, I have reviewed and discussed with Diana Mathews certain preventive protocols, quality metrics, and best practice recommendations. A written personalized care plan for preventive services as well as general preventive health recommendations is available and can be mailed to the patient at her request.      Lynnea Ferrier, LPN  [phone]

## 2020-02-02 ENCOUNTER — Other Ambulatory Visit: Payer: Self-pay | Admitting: Family Medicine

## 2020-02-02 NOTE — Telephone Encounter (Signed)
Last office visit 01/05/2020 Last refill 3/11/20212, #30, no refills

## 2020-02-07 DIAGNOSIS — E611 Iron deficiency: Secondary | ICD-10-CM | POA: Insufficient documentation

## 2020-02-10 ENCOUNTER — Other Ambulatory Visit: Payer: Medicare Other

## 2020-02-10 ENCOUNTER — Other Ambulatory Visit: Payer: Self-pay

## 2020-02-10 DIAGNOSIS — R7989 Other specified abnormal findings of blood chemistry: Secondary | ICD-10-CM

## 2020-02-11 LAB — HEPATIC FUNCTION PANEL
ALT: 27 IU/L (ref 0–32)
AST: 24 IU/L (ref 0–40)
Albumin: 4.7 g/dL (ref 3.8–4.8)
Alkaline Phosphatase: 114 IU/L (ref 39–117)
Bilirubin Total: 0.2 mg/dL (ref 0.0–1.2)
Bilirubin, Direct: 0.11 mg/dL (ref 0.00–0.40)
Total Protein: 7.5 g/dL (ref 6.0–8.5)

## 2020-02-11 NOTE — Progress Notes (Signed)
Hello Threasa,  Your lab result is normal and/or stable.Some minor variations that are not significant are commonly marked abnormal, but do not represent any medical problem for you.  Best regards, Jakiya Bookbinder, M.D.

## 2020-02-29 DIAGNOSIS — D72820 Lymphocytosis (symptomatic): Secondary | ICD-10-CM | POA: Insufficient documentation

## 2020-03-02 ENCOUNTER — Other Ambulatory Visit: Payer: Self-pay | Admitting: Family Medicine

## 2020-03-02 NOTE — Telephone Encounter (Signed)
Last office visit 01/05/2020 Upcoming office visit 04/10/2020 Last refill 02/02/2020, #30, no refills

## 2020-03-05 ENCOUNTER — Other Ambulatory Visit: Payer: Self-pay | Admitting: Family Medicine

## 2020-03-05 NOTE — Telephone Encounter (Signed)
Covering provider for [employer] appointment 04/10/2020 Last appointment 01/05/2020 Last refill 01/05/2020, #30, 2 refills

## 2020-03-21 ENCOUNTER — Other Ambulatory Visit: Payer: Self-pay | Admitting: Family Medicine

## 2020-03-27 ENCOUNTER — Other Ambulatory Visit: Payer: Self-pay | Admitting: Family Medicine

## 2020-04-06 ENCOUNTER — Other Ambulatory Visit: Payer: Self-pay | Admitting: Family Medicine

## 2020-04-06 ENCOUNTER — Other Ambulatory Visit: Payer: Self-pay | Admitting: Nurse Practitioner

## 2020-04-06 DIAGNOSIS — E118 Type 2 diabetes mellitus with unspecified complications: Secondary | ICD-10-CM

## 2020-04-10 ENCOUNTER — Encounter: Payer: Self-pay | Admitting: Family Medicine

## 2020-04-10 ENCOUNTER — Other Ambulatory Visit: Payer: Self-pay

## 2020-04-10 ENCOUNTER — Ambulatory Visit (INDEPENDENT_AMBULATORY_CARE_PROVIDER_SITE_OTHER): Payer: Medicare Other | Admitting: Family Medicine

## 2020-04-10 VITALS — BP 135/84 | HR 69 | Temp 97.4°F | Ht 64.0 in | Wt 227.0 lb

## 2020-04-10 DIAGNOSIS — I4811 Longstanding persistent atrial fibrillation: Secondary | ICD-10-CM

## 2020-04-10 DIAGNOSIS — F39 Unspecified mood [affective] disorder: Secondary | ICD-10-CM

## 2020-04-10 DIAGNOSIS — E1165 Type 2 diabetes mellitus with hyperglycemia: Secondary | ICD-10-CM | POA: Diagnosis not present

## 2020-04-10 DIAGNOSIS — I10 Essential (primary) hypertension: Secondary | ICD-10-CM

## 2020-04-10 DIAGNOSIS — E785 Hyperlipidemia, unspecified: Secondary | ICD-10-CM

## 2020-04-10 DIAGNOSIS — Z23 Encounter for immunization: Secondary | ICD-10-CM

## 2020-04-10 LAB — BAYER DCA HB A1C WAIVED: HB A1C (BAYER DCA - WAIVED): 8.9 % — ABNORMAL HIGH (ref <7.0)

## 2020-04-10 MED ORDER — SITAGLIPTIN PHOSPHATE 100 MG PO TABS: 100.0000 mg | ORAL_TABLET | Freq: Every day | ORAL | 2 refills | Status: DC

## 2020-04-10 NOTE — Progress Notes (Signed)
Subjective:  Patient ID: Diana Mathews,  female    DOB: 1950-12-01  Age: 69 y.o.    CC: Follow-up   HPI Diana Mathews presents for  follow-up of hypertension. Patient has no history of headache chest pain or shortness of breath or recent cough. Patient also denies symptoms of TIA such as numbness weakness lateralizing. Patient denies side effects from medication. States taking it regularly.  Patient also  in for follow-up of elevated cholesterol.  Her cancer specialist took him off of the Lipitor and several others due to elevated liver functions.  They do say the cancer is about to come under control. Follow-up of diabetes.Patient denies symptoms such as excessive hunger or urinary frequency, excessive hunger, nausea No significant hypoglycemic spells noted. Medications reviewed. Pt reports taking them regularly. Pt. denies complication/adverse reaction today.   Patient in for follow-up of atrial fibrillation. Patient denies any recent bouts of chest pain or palpitations. Additionally, patient is taking anticoagulants. Patient denies any recent excessive bleeding episodes including epistaxis, bleeding from the gums, genitalia, rectal bleeding or hematuria. Additionally there has been no excessive bruising.  History Diana Mathews has a past medical history of Arthritis, Atrial fibrillation with rapid ventricular response ([place]), Cancer ([place]), Controlled type 2 diabetes mellitus with complication, without long-term current use of insulin ([place]) (07/06/2018), Depression, Headache, HLD (hyperlipidemia) (08/26/2016), Hypertension, New onset atrial fibrillation ([place]) (06/12/2018), Sleep apnea, and Stroke ([place]).   She has a past surgical history that includes Abdominal hysterectomy; Wrist surgery (Bilateral); Hernia repair; Cholecystectomy; Tubal ligation; and Foot surgery (Left).   Her family history includes Arthritis in her mother, sister, and sister; COPD in her mother; Diabetes in her brother,  brother, mother, sister, and sister; Hypertension in her mother; Mental illness in her brother.She reports that she quit smoking about 35 years ago. Her smoking use included cigarettes. She has never used smokeless tobacco. She reports that she does not drink alcohol and does not use drugs.  Current Outpatient Medications on File Prior to Visit  Medication Sig Dispense Refill  . Accu-Chek FastClix Lancets MISC USE 4 TIMES DAILY 306 each 3  . ACCU-CHEK GUIDE test strip 4 TIMES DAILY 400 each 3  . apixaban (ELIQUIS) 5 MG TABS tablet Take 1 tablet (5 mg total) by mouth 2 (two) times daily. 180 tablet 1  . ARIPiprazole (ABILIFY) 5 MG tablet Take 1 tablet (5 mg total) by mouth daily. 90 tablet 1  . BELSOMRA 15 MG TABS TAKE 1 TABLET AT BEDTIME FOR SLEEP 30 tablet 0  . carvedilol (COREG) 3.125 MG tablet TAKE 1 AND 1/2 TABLET TWICE A DAY 45 tablet 0  . diltiazem (CARDIZEM CD) 240 MG 24 hr capsule TAKE ONE CAPSULE BY MOUTH TWICE A DAY 180 capsule 1  . diphenhydrAMINE (BENADRYL) 25 mg capsule Take 25 mg by mouth at bedtime as needed.    . DULoxetine (CYMBALTA) 60 MG capsule Take 60 mg by mouth 2 (two) times daily.    Marland Kitchen HYDROcodone-acetaminophen (NORCO) 10-325 MG tablet Take 1 tablet by mouth every 6 (six) hours as needed.    . metFORMIN (GLUCOPHAGE-XR) 750 MG 24 hr tablet TAKE 1 TABLET IN THE MORNING AND AT BEDTIME 180 tablet 0  . mirtazapine (REMERON) 15 MG tablet Take 1 tablet (15 mg total) by mouth at bedtime. 30 tablet 3  . palbociclib (IBRANCE) 125 MG tablet Take by mouth.    Marland Kitchen tiZANidine (ZANAFLEX) 4 MG tablet TAKE 1 TABLET EVERY 6 HOURS AS NEEDED FOR MUSCLE SPASMS  30 tablet 0  . atorvastatin (LIPITOR) 80 MG tablet TAKE ONE (1) TABLET EACH DAY (Patient not taking: Reported on 01/11/2020) 90 tablet 1  . Melatonin 10 MG CAPS Take 20 mg by mouth at bedtime. (Patient not taking: Reported on 04/10/2020)    . nabumetone (RELAFEN) 500 MG tablet TAKE TWO TABLETS BY MOUTH TWICE DAILY (Patient not taking:  Reported on 04/10/2020) 120 tablet 0  . venlafaxine XR (EFFEXOR-XR) 75 MG 24 hr capsule Take 1 capsule (75 mg total) by mouth 2 (two) times daily. Needs to be seen for future refills. (Patient not taking: Reported on 04/10/2020) 180 capsule 1   No current facility-administered medications on file prior to visit.    ROS Review of Systems  Constitutional: Negative.   HENT: Negative.   Eyes: Negative for visual disturbance.  Respiratory: Negative for shortness of breath.   Cardiovascular: Negative for chest pain.  Gastrointestinal: Negative for abdominal pain.  Musculoskeletal: Negative for arthralgias.    Objective:  BP 135/84   Pulse 69   Temp (!) 97.4 F (36.3 C) (Temporal)   Ht 5' 4"  (1.626 m)   Wt 227 lb (103 kg)   BMI 38.96 kg/m   BP Readings from Last 3 Encounters:  04/10/20 135/84  01/05/20 134/68  05/04/19 117/63    Wt Readings from Last 3 Encounters:  04/10/20 227 lb (103 kg)  01/05/20 230 lb (104.3 kg)  05/04/19 262 lb (118.8 kg)     Physical Exam Constitutional:      General: She is not in acute distress.    Appearance: She is well-developed.  Cardiovascular:     Rate and Rhythm: Normal rate and regular rhythm.  Pulmonary:     Breath sounds: Normal breath sounds.  Skin:    General: Skin is warm and dry.  Neurological:     Mental Status: She is alert and oriented to person, place, and time.    Results for orders placed or performed in visit on 04/10/20  Bayer DCA Hb A1c Waived  Result Value Ref Range   HB A1C (BAYER DCA - WAIVED) 8.9 (H) <7.0 %     Diabetic Foot Exam - Simple   No data filed        Assessment & Plan:   Diana Mathews was seen today for follow-up.  Diagnoses and all orders for this visit:  Uncontrolled type 2 diabetes mellitus with hyperglycemia ([place]) -     CBC with Differential/Platelet -     CMP14+EGFR -     Bayer DCA Hb A1c Waived -     Microalbumin / creatinine urine ratio -     sitaGLIPtin (JANUVIA) 100 MG tablet; Take 1  tablet (100 mg total) by mouth daily.  Essential hypertension -     CBC with Differential/Platelet -     CMP14+EGFR  Obesity, Class III, BMI 40-49.9 (morbid obesity) (HCC) -     CBC with Differential/Platelet -     CMP14+EGFR  Hyperlipidemia, unspecified hyperlipidemia type -     CBC with Differential/Platelet -     CMP14+EGFR  Longstanding persistent atrial fibrillation (HCC)  Mood disorder ([place])  Other orders -     Tdap vaccine greater than or equal to 7yo IM -     Pneumococcal conjugate vaccine 13-valent   I am having Diana Mathews. Fichter start on sitaGLIPtin. I am also having her maintain her Melatonin, diphenhydrAMINE, mirtazapine, Accu-Chek FastClix Lancets, Accu-Chek Guide, diltiazem, venlafaxine XR, atorvastatin, ARIPiprazole, apixaban, HYDROcodone-acetaminophen, Belsomra, nabumetone, tiZANidine, metFORMIN, carvedilol,  palbociclib, and DULoxetine.  Meds ordered this encounter  Medications  . sitaGLIPtin (JANUVIA) 100 MG tablet    Sig: Take 1 tablet (100 mg total) by mouth daily.    Dispense:  30 tablet    Refill:  2     Follow-up: Return in about 3 months (around 07/11/2020).  Claretta Fraise, M.D.

## 2020-04-11 LAB — CMP14+EGFR
ALT: 39 IU/L — ABNORMAL HIGH (ref 0–32)
AST: 19 IU/L (ref 0–40)
Albumin/Globulin Ratio: 1.4 (ref 1.2–2.2)
Albumin: 4.7 g/dL (ref 3.8–4.8)
Alkaline Phosphatase: 169 IU/L — ABNORMAL HIGH (ref 48–121)
BUN/Creatinine Ratio: 21 (ref 12–28)
BUN: 16 mg/dL (ref 8–27)
Bilirubin Total: 0.3 mg/dL (ref 0.0–1.2)
CO2: 20 mmol/L (ref 20–29)
Calcium: 9.9 mg/dL (ref 8.7–10.3)
Chloride: 97 mmol/L (ref 96–106)
Creatinine, Ser: 0.77 mg/dL (ref 0.57–1.00)
GFR calc Af Amer: 93 mL/min/{1.73_m2} (ref >59)
GFR calc non Af Amer: 81 mL/min/{1.73_m2} (ref >59)
Globulin, Total: 3.3 g/dL (ref 1.5–4.5)
Glucose: 357 mg/dL — ABNORMAL HIGH (ref 65–99)
Potassium: 4.4 mmol/L (ref 3.5–5.2)
Sodium: 137 mmol/L (ref 134–144)
Total Protein: 8 g/dL (ref 6.0–8.5)

## 2020-04-11 LAB — CBC WITH DIFFERENTIAL/PLATELET
Basophils Absolute: 0 10*3/uL (ref 0.0–0.2)
Basos: 1 %
EOS (ABSOLUTE): 0.1 10*3/uL (ref 0.0–0.4)
Eos: 1 %
Hematocrit: 40.9 % (ref 34.0–46.6)
Hemoglobin: 14 g/dL (ref 11.1–15.9)
Immature Grans (Abs): 0 10*3/uL (ref 0.0–0.1)
Immature Granulocytes: 0 %
Lymphocytes Absolute: 2.9 10*3/uL (ref 0.7–3.1)
Lymphs: 53 %
MCH: 32.7 pg (ref 26.6–33.0)
MCHC: 34.2 g/dL (ref 31.5–35.7)
MCV: 96 fL (ref 79–97)
Monocytes Absolute: 0.2 10*3/uL (ref 0.1–0.9)
Monocytes: 4 %
Neutrophils Absolute: 2.3 10*3/uL (ref 1.4–7.0)
Neutrophils: 41 %
Platelets: 251 10*3/uL (ref 150–450)
RBC: 4.28 x10E6/uL (ref 3.77–5.28)
RDW: 13.6 % (ref 11.7–15.4)
WBC: 5.5 10*3/uL (ref 3.4–10.8)

## 2020-04-18 ENCOUNTER — Other Ambulatory Visit: Payer: Self-pay | Admitting: Family Medicine

## 2020-05-02 DIAGNOSIS — E119 Type 2 diabetes mellitus without complications: Secondary | ICD-10-CM | POA: Diagnosis not present

## 2020-05-02 DIAGNOSIS — Z79899 Other long term (current) drug therapy: Secondary | ICD-10-CM | POA: Diagnosis not present

## 2020-05-02 DIAGNOSIS — G894 Chronic pain syndrome: Secondary | ICD-10-CM | POA: Diagnosis not present

## 2020-05-02 DIAGNOSIS — Z9181 History of falling: Secondary | ICD-10-CM | POA: Diagnosis not present

## 2020-05-04 DIAGNOSIS — D0511 Intraductal carcinoma in situ of right breast: Secondary | ICD-10-CM | POA: Diagnosis not present

## 2020-05-04 DIAGNOSIS — C7951 Secondary malignant neoplasm of bone: Secondary | ICD-10-CM | POA: Diagnosis not present

## 2020-05-04 DIAGNOSIS — C773 Secondary and unspecified malignant neoplasm of axilla and upper limb lymph nodes: Secondary | ICD-10-CM | POA: Diagnosis not present

## 2020-05-04 DIAGNOSIS — C50912 Malignant neoplasm of unspecified site of left female breast: Secondary | ICD-10-CM | POA: Diagnosis not present

## 2020-05-04 DIAGNOSIS — Z5112 Encounter for antineoplastic immunotherapy: Secondary | ICD-10-CM | POA: Diagnosis not present

## 2020-05-05 DIAGNOSIS — M62838 Other muscle spasm: Secondary | ICD-10-CM | POA: Diagnosis not present

## 2020-05-05 DIAGNOSIS — G8929 Other chronic pain: Secondary | ICD-10-CM | POA: Diagnosis not present

## 2020-05-07 ENCOUNTER — Other Ambulatory Visit: Payer: Self-pay | Admitting: Family Medicine

## 2020-05-07 NOTE — Telephone Encounter (Signed)
Last OV 04/10/20. Last RF 04/18/20 # 30 no refills Next OV 07/11/20  Ok to refill?

## 2020-05-10 DIAGNOSIS — Z79891 Long term (current) use of opiate analgesic: Secondary | ICD-10-CM | POA: Diagnosis not present

## 2020-05-19 DIAGNOSIS — G8929 Other chronic pain: Secondary | ICD-10-CM | POA: Diagnosis not present

## 2020-05-19 DIAGNOSIS — M62838 Other muscle spasm: Secondary | ICD-10-CM | POA: Diagnosis not present

## 2020-05-23 ENCOUNTER — Other Ambulatory Visit: Payer: Self-pay | Admitting: Family Medicine

## 2020-05-31 DIAGNOSIS — G894 Chronic pain syndrome: Secondary | ICD-10-CM | POA: Diagnosis not present

## 2020-05-31 DIAGNOSIS — Z79899 Other long term (current) drug therapy: Secondary | ICD-10-CM | POA: Diagnosis not present

## 2020-05-31 DIAGNOSIS — L089 Local infection of the skin and subcutaneous tissue, unspecified: Secondary | ICD-10-CM | POA: Diagnosis not present

## 2020-06-05 ENCOUNTER — Other Ambulatory Visit: Payer: Self-pay | Admitting: Family Medicine

## 2020-06-05 DIAGNOSIS — E1165 Type 2 diabetes mellitus with hyperglycemia: Secondary | ICD-10-CM

## 2020-06-05 DIAGNOSIS — G8929 Other chronic pain: Secondary | ICD-10-CM | POA: Diagnosis not present

## 2020-06-05 DIAGNOSIS — M62838 Other muscle spasm: Secondary | ICD-10-CM | POA: Diagnosis not present

## 2020-06-06 DIAGNOSIS — D0511 Intraductal carcinoma in situ of right breast: Secondary | ICD-10-CM | POA: Diagnosis not present

## 2020-06-06 DIAGNOSIS — E119 Type 2 diabetes mellitus without complications: Secondary | ICD-10-CM | POA: Diagnosis not present

## 2020-06-06 DIAGNOSIS — Z7901 Long term (current) use of anticoagulants: Secondary | ICD-10-CM | POA: Diagnosis not present

## 2020-06-06 DIAGNOSIS — Z5112 Encounter for antineoplastic immunotherapy: Secondary | ICD-10-CM | POA: Diagnosis not present

## 2020-06-06 DIAGNOSIS — C773 Secondary and unspecified malignant neoplasm of axilla and upper limb lymph nodes: Secondary | ICD-10-CM | POA: Diagnosis not present

## 2020-06-06 DIAGNOSIS — Z8673 Personal history of transient ischemic attack (TIA), and cerebral infarction without residual deficits: Secondary | ICD-10-CM | POA: Diagnosis not present

## 2020-06-06 DIAGNOSIS — C7951 Secondary malignant neoplasm of bone: Secondary | ICD-10-CM | POA: Diagnosis not present

## 2020-06-06 DIAGNOSIS — E611 Iron deficiency: Secondary | ICD-10-CM | POA: Diagnosis not present

## 2020-06-06 DIAGNOSIS — D72820 Lymphocytosis (symptomatic): Secondary | ICD-10-CM | POA: Diagnosis not present

## 2020-06-06 DIAGNOSIS — C50912 Malignant neoplasm of unspecified site of left female breast: Secondary | ICD-10-CM | POA: Diagnosis not present

## 2020-06-06 DIAGNOSIS — I4811 Longstanding persistent atrial fibrillation: Secondary | ICD-10-CM | POA: Diagnosis not present

## 2020-06-06 DIAGNOSIS — Z170 Estrogen receptor positive status [ER+]: Secondary | ICD-10-CM | POA: Diagnosis not present

## 2020-06-06 DIAGNOSIS — I10 Essential (primary) hypertension: Secondary | ICD-10-CM | POA: Diagnosis not present

## 2020-06-11 ENCOUNTER — Other Ambulatory Visit: Payer: Self-pay | Admitting: Family Medicine

## 2020-06-15 ENCOUNTER — Other Ambulatory Visit: Payer: Self-pay | Admitting: Family Medicine

## 2020-06-19 DIAGNOSIS — M62838 Other muscle spasm: Secondary | ICD-10-CM | POA: Diagnosis not present

## 2020-06-19 DIAGNOSIS — G8929 Other chronic pain: Secondary | ICD-10-CM | POA: Diagnosis not present

## 2020-06-27 DIAGNOSIS — G894 Chronic pain syndrome: Secondary | ICD-10-CM | POA: Diagnosis not present

## 2020-06-27 DIAGNOSIS — Z79899 Other long term (current) drug therapy: Secondary | ICD-10-CM | POA: Diagnosis not present

## 2020-07-04 ENCOUNTER — Other Ambulatory Visit: Payer: Self-pay | Admitting: Family Medicine

## 2020-07-04 DIAGNOSIS — C773 Secondary and unspecified malignant neoplasm of axilla and upper limb lymph nodes: Secondary | ICD-10-CM | POA: Diagnosis not present

## 2020-07-04 DIAGNOSIS — D72820 Lymphocytosis (symptomatic): Secondary | ICD-10-CM | POA: Diagnosis not present

## 2020-07-04 DIAGNOSIS — D509 Iron deficiency anemia, unspecified: Secondary | ICD-10-CM | POA: Diagnosis not present

## 2020-07-04 DIAGNOSIS — E785 Hyperlipidemia, unspecified: Secondary | ICD-10-CM | POA: Diagnosis not present

## 2020-07-04 DIAGNOSIS — I4891 Unspecified atrial fibrillation: Secondary | ICD-10-CM | POA: Diagnosis not present

## 2020-07-04 DIAGNOSIS — C50912 Malignant neoplasm of unspecified site of left female breast: Secondary | ICD-10-CM | POA: Diagnosis not present

## 2020-07-04 DIAGNOSIS — Z8673 Personal history of transient ischemic attack (TIA), and cerebral infarction without residual deficits: Secondary | ICD-10-CM | POA: Diagnosis not present

## 2020-07-04 DIAGNOSIS — I4811 Longstanding persistent atrial fibrillation: Secondary | ICD-10-CM | POA: Diagnosis not present

## 2020-07-04 DIAGNOSIS — C7951 Secondary malignant neoplasm of bone: Secondary | ICD-10-CM | POA: Diagnosis not present

## 2020-07-04 DIAGNOSIS — D0511 Intraductal carcinoma in situ of right breast: Secondary | ICD-10-CM | POA: Diagnosis not present

## 2020-07-04 DIAGNOSIS — E119 Type 2 diabetes mellitus without complications: Secondary | ICD-10-CM | POA: Diagnosis not present

## 2020-07-04 DIAGNOSIS — I10 Essential (primary) hypertension: Secondary | ICD-10-CM | POA: Diagnosis not present

## 2020-07-04 DIAGNOSIS — E118 Type 2 diabetes mellitus with unspecified complications: Secondary | ICD-10-CM

## 2020-07-04 DIAGNOSIS — Z170 Estrogen receptor positive status [ER+]: Secondary | ICD-10-CM | POA: Diagnosis not present

## 2020-07-04 DIAGNOSIS — Z87891 Personal history of nicotine dependence: Secondary | ICD-10-CM | POA: Diagnosis not present

## 2020-07-05 ENCOUNTER — Other Ambulatory Visit: Payer: Self-pay | Admitting: *Deleted

## 2020-07-05 DIAGNOSIS — E1165 Type 2 diabetes mellitus with hyperglycemia: Secondary | ICD-10-CM

## 2020-07-05 MED ORDER — SITAGLIPTIN PHOSPHATE 100 MG PO TABS: ORAL_TABLET | ORAL | 0 refills | Status: DC

## 2020-07-06 DIAGNOSIS — M62838 Other muscle spasm: Secondary | ICD-10-CM | POA: Diagnosis not present

## 2020-07-06 DIAGNOSIS — G8929 Other chronic pain: Secondary | ICD-10-CM | POA: Diagnosis not present

## 2020-07-11 ENCOUNTER — Telehealth: Payer: Self-pay | Admitting: Family Medicine

## 2020-07-11 ENCOUNTER — Encounter: Payer: Self-pay | Admitting: Family Medicine

## 2020-07-11 ENCOUNTER — Other Ambulatory Visit: Payer: Self-pay

## 2020-07-11 ENCOUNTER — Ambulatory Visit (INDEPENDENT_AMBULATORY_CARE_PROVIDER_SITE_OTHER): Payer: Medicare Other | Admitting: Family Medicine

## 2020-07-11 VITALS — BP 138/82 | HR 90 | Temp 97.4°F | Resp 20 | Ht 64.0 in | Wt 227.0 lb

## 2020-07-11 DIAGNOSIS — E118 Type 2 diabetes mellitus with unspecified complications: Secondary | ICD-10-CM | POA: Diagnosis not present

## 2020-07-11 DIAGNOSIS — I10 Essential (primary) hypertension: Secondary | ICD-10-CM | POA: Diagnosis not present

## 2020-07-11 DIAGNOSIS — E1165 Type 2 diabetes mellitus with hyperglycemia: Secondary | ICD-10-CM | POA: Diagnosis not present

## 2020-07-11 DIAGNOSIS — E785 Hyperlipidemia, unspecified: Secondary | ICD-10-CM | POA: Diagnosis not present

## 2020-07-11 DIAGNOSIS — T63331A Toxic effect of venom of brown recluse spider, accidental (unintentional), initial encounter: Secondary | ICD-10-CM | POA: Diagnosis not present

## 2020-07-11 DIAGNOSIS — I4811 Longstanding persistent atrial fibrillation: Secondary | ICD-10-CM

## 2020-07-11 DIAGNOSIS — F39 Unspecified mood [affective] disorder: Secondary | ICD-10-CM

## 2020-07-11 DIAGNOSIS — D7589 Other specified diseases of blood and blood-forming organs: Secondary | ICD-10-CM

## 2020-07-11 LAB — BAYER DCA HB A1C WAIVED: HB A1C (BAYER DCA - WAIVED): 8.3 % — ABNORMAL HIGH (ref <7.0)

## 2020-07-11 MED ORDER — VENLAFAXINE HCL ER 75 MG PO CP24: 75.0000 mg | ORAL_CAPSULE | Freq: Two times a day (BID) | ORAL | 1 refills | Status: DC

## 2020-07-11 MED ORDER — APIXABAN 5 MG PO TABS: 5.0000 mg | ORAL_TABLET | Freq: Two times a day (BID) | ORAL | 1 refills | Status: DC

## 2020-07-11 MED ORDER — DOXYCYCLINE HYCLATE 100 MG PO CAPS: 100.0000 mg | ORAL_CAPSULE | Freq: Two times a day (BID) | ORAL | 0 refills | Status: DC

## 2020-07-11 MED ORDER — DILTIAZEM HCL ER COATED BEADS 240 MG PO CP24: 240.0000 mg | ORAL_CAPSULE | Freq: Two times a day (BID) | ORAL | 1 refills | Status: DC

## 2020-07-11 MED ORDER — MIRTAZAPINE 15 MG PO TABS
15.0000 mg | ORAL_TABLET | Freq: Every day | ORAL | 3 refills | Status: DC
Start: 2020-07-11 — End: 2021-05-09

## 2020-07-11 MED ORDER — SITAGLIPTIN PHOSPHATE 100 MG PO TABS: ORAL_TABLET | ORAL | 0 refills | Status: DC

## 2020-07-11 MED ORDER — ARIPIPRAZOLE 5 MG PO TABS: 5.0000 mg | ORAL_TABLET | Freq: Every day | ORAL | 1 refills | Status: DC

## 2020-07-11 MED ORDER — METFORMIN HCL ER 750 MG PO TB24: ORAL_TABLET | ORAL | 1 refills | Status: DC

## 2020-07-11 MED ORDER — ATORVASTATIN CALCIUM 80 MG PO TABS: ORAL_TABLET | ORAL | 1 refills | Status: DC

## 2020-07-11 MED ORDER — DAPSONE 100 MG PO TABS: 100.0000 mg | ORAL_TABLET | Freq: Every day | ORAL | 0 refills | Status: DC

## 2020-07-11 MED ORDER — TIZANIDINE HCL 4 MG PO TABS
ORAL_TABLET | ORAL | 2 refills | Status: DC
Start: 2020-07-11 — End: 2020-08-13

## 2020-07-11 NOTE — Telephone Encounter (Signed)
Pt had an apt today and was to call back to give Dr Livia Snellen medication information: carvedilol 3-125 mg.

## 2020-07-11 NOTE — Telephone Encounter (Signed)
My question was if she is taking it regularly or not.

## 2020-07-11 NOTE — Telephone Encounter (Signed)
She is taking regularly

## 2020-07-11 NOTE — Telephone Encounter (Signed)
Thanks

## 2020-07-11 NOTE — Telephone Encounter (Signed)
Med is currently on med list   FYI

## 2020-07-11 NOTE — Progress Notes (Signed)
Subjective:  Patient ID: GRACEY TOLLE,  female    DOB: 12/31/1950  Age: 69 y.o.    CC: Medical Management of Chronic Issues   HPI SORCHA ROTUNNO presents for  follow-up of hypertension. Patient has no history of headache chest pain or shortness of breath or recent cough. Patient also denies symptoms of TIA such as numbness weakness lateralizing. Patient denies side effects from medication. States taking it regularly.  Patient also  in for follow-up of elevated cholesterol. Doing well without complaints on current medication. Denies side effects  including myalgia and arthralgia and nausea. Also in today for liver function testing. Currently no chest pain, shortness of breath or other cardiovascular related symptoms noted.  Follow-up of diabetes. Patient does not check blood sugar at home.  Patient denies symptoms such as excessive hunger or urinary frequency, excessive hunger, nausea No significant hypoglycemic spells noted. Medications reviewed. Pt reports taking them regularly. Pt. denies complication/adverse reaction today.   Atrial fibrillation follow up. Pt. is treated with rate control and anticoagulation. Pt.  denies palpitations, rapid rate, chest pain, dyspnea and edema. There has been no bleeding from nose or gums. Pt. has not noticed blood with urine or stool.  Although there is routine bruising easily, it is not excessive.  Patient reports that she was bit by what she thinks is a brown recluse spider.  She has a red discoloration with pain on the right dorsal midfoot.    History Zaray has a past medical history of Arthritis, Atrial fibrillation with rapid ventricular response ([place]), Cancer ([place]), Controlled type 2 diabetes mellitus with complication, without long-term current use of insulin ([place]) (07/06/2018), Depression, Headache, HLD (hyperlipidemia) (08/26/2016), Hypertension, New onset atrial fibrillation ([place]) (06/12/2018), Sleep apnea, and Stroke ([place]).   She has a  past surgical history that includes Abdominal hysterectomy; Wrist surgery (Bilateral); Hernia repair; Cholecystectomy; Tubal ligation; and Foot surgery (Left).   Her family history includes Arthritis in her mother, sister, and sister; COPD in her mother; Diabetes in her brother, brother, mother, sister, and sister; Hypertension in her mother; Mental illness in her brother.She reports that she quit smoking about 35 years ago. Her smoking use included cigarettes. She has never used smokeless tobacco. She reports that she does not drink alcohol and does not use drugs.  Current Outpatient Medications on File Prior to Visit  Medication Sig Dispense Refill  . Accu-Chek FastClix Lancets MISC USE 4 TIMES DAILY 306 each 3  . ACCU-CHEK GUIDE test strip 4 TIMES DAILY 400 each 3  . BELSOMRA 15 MG TABS TAKE 1 TABLET AT BEDTIME FOR SLEEP 30 tablet 5  . carvedilol (COREG) 3.125 MG tablet TAKE 1 AND 1/2 TABLET TWICE A DAY 45 tablet 0  . diphenhydrAMINE (BENADRYL) 25 mg capsule Take 25 mg by mouth at bedtime as needed.    Marland Kitchen HYDROcodone-acetaminophen (NORCO) 10-325 MG tablet Take 1 tablet by mouth every 6 (six) hours as needed.    . nabumetone (RELAFEN) 500 MG tablet TAKE TWO TABLETS BY MOUTH TWICE DAILY 120 tablet 0  . palbociclib (IBRANCE) 125 MG tablet Take by mouth.     No current facility-administered medications on file prior to visit.    ROS Review of Systems  Constitutional: Negative.   HENT: Negative.   Eyes: Negative for visual disturbance.  Respiratory: Negative for shortness of breath.   Cardiovascular: Negative for chest pain.  Gastrointestinal: Negative for abdominal pain.  Musculoskeletal: Negative for arthralgias.    Objective:  BP 138/82  Pulse 90   Temp (!) 97.4 F (36.3 C) (Temporal)   Resp 20   Ht _0  (1.626 m)   Wt 227 lb (103 kg)   SpO2 95%   BMI 38.96 kg/m   BP Readings from Last 3 Encounters:  07/11/20 138/82  04/10/20 135/84  01/05/20 134/68    Wt Readings  from Last 3 Encounters:  07/11/20 227 lb (103 kg)  04/10/20 227 lb (103 kg)  01/05/20 230 lb (104.3 kg)     Physical Exam Constitutional:      General: She is not in acute distress.    Appearance: She is well-developed.  HENT:     Head: Normocephalic and atraumatic.  Eyes:     Conjunctiva/sclera: Conjunctivae normal.     Pupils: Pupils are equal, round, and reactive to light.  Neck:     Thyroid: No thyromegaly.  Cardiovascular:     Rate and Rhythm: Normal rate and regular rhythm.     Heart sounds: Normal heart sounds. No murmur heard.   Pulmonary:     Effort: Pulmonary effort is normal. No respiratory distress.     Breath sounds: Normal breath sounds. No wheezing or rales.  Abdominal:     General: Bowel sounds are normal. There is no distension.     Palpations: Abdomen is soft.     Tenderness: There is no abdominal tenderness.  Musculoskeletal:        General: Normal range of motion.     Cervical back: Normal range of motion and neck supple.  Lymphadenopathy:     Cervical: No cervical adenopathy.  Skin:    General: Skin is warm and dry.  Neurological:     Mental Status: She is alert and oriented to person, place, and time.  Psychiatric:        Behavior: Behavior normal.        Thought Content: Thought content normal.        Judgment: Judgment normal.     Diabetic Foot Exam - Simple   Simple Foot Form Visual Inspection See comments: Yes Sensation Testing Intact to touch and monofilament testing bilaterally: Yes Pulse Check Posterior Tibialis and Dorsalis pulse intact bilaterally: Yes Comments The right forefoot is noted to have a 1 cm erythematous eruption with a 3 mm central eschar.  This is tender but not fluctuant.  It feels slightly warm to touch.       Assessment & Plan:   Twila was seen today for medical management of chronic issues.  Diagnoses and all orders for this visit:  Uncontrolled type 2 diabetes mellitus with hyperglycemia ([place]) -      Bayer DCA Hb A1c Waived -     CBC with Differential/Platelet -     CMP14+EGFR -     Lipid panel -     sitaGLIPtin (JANUVIA) 100 MG tablet; TAKE ONE (1) TABLET EACH DAY  Essential hypertension -     CBC with Differential/Platelet -     CMP14+EGFR -     Lipid panel  Hyperlipidemia, unspecified hyperlipidemia type -     CBC with Differential/Platelet -     CMP14+EGFR -     Lipid panel  Controlled type 2 diabetes mellitus with complication, without long-term current use of insulin (HCC) -     atorvastatin (LIPITOR) 80 MG tablet; TAKE ONE (1) TABLET EACH DAY -     metFORMIN (GLUCOPHAGE-XR) 750 MG 24 hr tablet; Take One at breakfast and two with supper  Owens Shark recluse spider  bite or sting, accidental or unintentional, initial encounter -     dapsone 100 MG tablet; Take 1 tablet (100 mg total) by mouth daily. -     doxycycline (VIBRAMYCIN) 100 MG capsule; Take 1 capsule (100 mg total) by mouth 2 (two) times daily.  Macrocytosis without anemia -     Folate -     MTHFR DNA Analysis -     Vitamin B12 -     Specimen status report  Mood disorder (HCC) -     ARIPiprazole (ABILIFY) 5 MG tablet; Take 1 tablet (5 mg total) by mouth daily. -     venlafaxine XR (EFFEXOR-XR) 75 MG 24 hr capsule; Take 1 capsule (75 mg total) by mouth 2 (two) times daily.  Longstanding persistent atrial fibrillation (HCC) -     apixaban (ELIQUIS) 5 MG TABS tablet; Take 1 tablet (5 mg total) by mouth 2 (two) times daily. -     diltiazem (CARDIZEM CD) 240 MG 24 hr capsule; Take 1 capsule (240 mg total) by mouth 2 (two) times daily.  Other orders -     mirtazapine (REMERON) 15 MG tablet; Take 1 tablet (15 mg total) by mouth at bedtime. -     tiZANidine (ZANAFLEX) 4 MG tablet; TAKE 1 TABLET EVERY 6 HOURS AS NEEDED FOR MUSCLE SPASMS   I have discontinued Osker Mason. Patin's Melatonin and DULoxetine. I have changed her Eliquis to apixaban. I have also changed her metFORMIN, diltiazem, and venlafaxine XR. Additionally,  I am having her start on dapsone and doxycycline. Lastly, I am having her maintain her diphenhydrAMINE, Accu-Chek FastClix Lancets, Accu-Chek Guide, HYDROcodone-acetaminophen, nabumetone, Belsomra, carvedilol, palbociclib, ARIPiprazole, atorvastatin, mirtazapine, sitaGLIPtin, and tiZANidine.  Meds ordered this encounter  Medications  . ARIPiprazole (ABILIFY) 5 MG tablet    Sig: Take 1 tablet (5 mg total) by mouth daily.    Dispense:  90 tablet    Refill:  1  . atorvastatin (LIPITOR) 80 MG tablet    Sig: TAKE ONE (1) TABLET EACH DAY    Dispense:  90 tablet    Refill:  1  . apixaban (ELIQUIS) 5 MG TABS tablet    Sig: Take 1 tablet (5 mg total) by mouth 2 (two) times daily.    Dispense:  180 tablet    Refill:  1  . metFORMIN (GLUCOPHAGE-XR) 750 MG 24 hr tablet    Sig: Take One at breakfast and two with supper    Dispense:  270 tablet    Refill:  1  . diltiazem (CARDIZEM CD) 240 MG 24 hr capsule    Sig: Take 1 capsule (240 mg total) by mouth 2 (two) times daily.    Dispense:  180 capsule    Refill:  1  . mirtazapine (REMERON) 15 MG tablet    Sig: Take 1 tablet (15 mg total) by mouth at bedtime.    Dispense:  30 tablet    Refill:  3  . sitaGLIPtin (JANUVIA) 100 MG tablet    Sig: TAKE ONE (1) TABLET EACH DAY    Dispense:  90 tablet    Refill:  0  . tiZANidine (ZANAFLEX) 4 MG tablet    Sig: TAKE 1 TABLET EVERY 6 HOURS AS NEEDED FOR MUSCLE SPASMS    Dispense:  30 tablet    Refill:  2  . venlafaxine XR (EFFEXOR-XR) 75 MG 24 hr capsule    Sig: Take 1 capsule (75 mg total) by mouth 2 (two) times daily.    Dispense:  180 capsule    Refill:  1  . dapsone 100 MG tablet    Sig: Take 1 tablet (100 mg total) by mouth daily.    Dispense:  10 tablet    Refill:  0  . doxycycline (VIBRAMYCIN) 100 MG capsule    Sig: Take 1 capsule (100 mg total) by mouth 2 (two) times daily.    Dispense:  20 capsule    Refill:  0     Follow-up: Return in about 3 months (around 10/10/2020).  Claretta Fraise, M.D.

## 2020-07-12 LAB — CBC WITH DIFFERENTIAL/PLATELET
Basophils Absolute: 0.1 10*3/uL (ref 0.0–0.2)
Basos: 1 %
EOS (ABSOLUTE): 0 10*3/uL (ref 0.0–0.4)
Eos: 1 %
Hematocrit: 37.4 % (ref 34.0–46.6)
Hemoglobin: 12.6 g/dL (ref 11.1–15.9)
Immature Grans (Abs): 0 10*3/uL (ref 0.0–0.1)
Immature Granulocytes: 0 %
Lymphocytes Absolute: 1.8 10*3/uL (ref 0.7–3.1)
Lymphs: 49 %
MCH: 35.8 pg — ABNORMAL HIGH (ref 26.6–33.0)
MCHC: 33.7 g/dL (ref 31.5–35.7)
MCV: 106 fL — ABNORMAL HIGH (ref 79–97)
Monocytes Absolute: 0.3 10*3/uL (ref 0.1–0.9)
Monocytes: 7 %
Neutrophils Absolute: 1.5 10*3/uL (ref 1.4–7.0)
Neutrophils: 42 %
Platelets: 214 10*3/uL (ref 150–450)
RBC: 3.52 x10E6/uL — ABNORMAL LOW (ref 3.77–5.28)
RDW: 16 % — ABNORMAL HIGH (ref 11.7–15.4)
WBC: 3.7 10*3/uL (ref 3.4–10.8)

## 2020-07-12 LAB — CMP14+EGFR
ALT: 22 IU/L (ref 0–32)
AST: 15 IU/L (ref 0–40)
Albumin/Globulin Ratio: 1.4 (ref 1.2–2.2)
Albumin: 4.5 g/dL (ref 3.8–4.8)
Alkaline Phosphatase: 104 IU/L (ref 44–121)
BUN/Creatinine Ratio: 20 (ref 12–28)
BUN: 16 mg/dL (ref 8–27)
Bilirubin Total: 0.3 mg/dL (ref 0.0–1.2)
CO2: 23 mmol/L (ref 20–29)
Calcium: 9.9 mg/dL (ref 8.7–10.3)
Chloride: 97 mmol/L (ref 96–106)
Creatinine, Ser: 0.81 mg/dL (ref 0.57–1.00)
GFR calc Af Amer: 88 mL/min/{1.73_m2} (ref >59)
GFR calc non Af Amer: 76 mL/min/{1.73_m2} (ref >59)
Globulin, Total: 3.3 g/dL (ref 1.5–4.5)
Glucose: 221 mg/dL — ABNORMAL HIGH (ref 65–99)
Potassium: 4.4 mmol/L (ref 3.5–5.2)
Sodium: 136 mmol/L (ref 134–144)
Total Protein: 7.8 g/dL (ref 6.0–8.5)

## 2020-07-12 LAB — LIPID PANEL
Chol/HDL Ratio: 2.7 ratio (ref 0.0–4.4)
Cholesterol, Total: 189 mg/dL (ref 100–199)
HDL: 69 mg/dL (ref >39)
LDL Chol Calc (NIH): 90 mg/dL (ref 0–99)
Triglycerides: 176 mg/dL — ABNORMAL HIGH (ref 0–149)
VLDL Cholesterol Cal: 30 mg/dL (ref 5–40)

## 2020-07-13 ENCOUNTER — Encounter: Payer: Self-pay | Admitting: Family Medicine

## 2020-07-13 DIAGNOSIS — C7951 Secondary malignant neoplasm of bone: Secondary | ICD-10-CM | POA: Diagnosis not present

## 2020-07-13 DIAGNOSIS — R918 Other nonspecific abnormal finding of lung field: Secondary | ICD-10-CM | POA: Diagnosis not present

## 2020-07-13 DIAGNOSIS — Z9049 Acquired absence of other specified parts of digestive tract: Secondary | ICD-10-CM | POA: Diagnosis not present

## 2020-07-13 DIAGNOSIS — C773 Secondary and unspecified malignant neoplasm of axilla and upper limb lymph nodes: Secondary | ICD-10-CM | POA: Diagnosis not present

## 2020-07-13 DIAGNOSIS — C50912 Malignant neoplasm of unspecified site of left female breast: Secondary | ICD-10-CM | POA: Diagnosis not present

## 2020-07-13 DIAGNOSIS — K760 Fatty (change of) liver, not elsewhere classified: Secondary | ICD-10-CM | POA: Diagnosis not present

## 2020-07-13 DIAGNOSIS — C50919 Malignant neoplasm of unspecified site of unspecified female breast: Secondary | ICD-10-CM | POA: Diagnosis not present

## 2020-07-13 MED ORDER — CARVEDILOL 3.125 MG PO TABS: ORAL_TABLET | ORAL | 1 refills | Status: DC

## 2020-07-20 ENCOUNTER — Other Ambulatory Visit: Payer: Self-pay | Admitting: Family Medicine

## 2020-07-20 ENCOUNTER — Telehealth: Payer: Self-pay | Admitting: Family Medicine

## 2020-07-20 DIAGNOSIS — T63331A Toxic effect of venom of brown recluse spider, accidental (unintentional), initial encounter: Secondary | ICD-10-CM

## 2020-07-20 DIAGNOSIS — G8929 Other chronic pain: Secondary | ICD-10-CM | POA: Diagnosis not present

## 2020-07-20 DIAGNOSIS — M62838 Other muscle spasm: Secondary | ICD-10-CM | POA: Diagnosis not present

## 2020-07-20 LAB — VITAMIN B12: Vitamin B-12: 1651 pg/mL — ABNORMAL HIGH (ref 232–1245)

## 2020-07-20 LAB — FOLATE: Folate: 8.2 ng/mL (ref >3.0)

## 2020-07-20 LAB — MTHFR DNA ANALYSIS

## 2020-07-20 LAB — SPECIMEN STATUS REPORT

## 2020-07-20 MED ORDER — DOXYCYCLINE HYCLATE 100 MG PO CAPS: 100.0000 mg | ORAL_CAPSULE | Freq: Two times a day (BID) | ORAL | 0 refills | Status: DC

## 2020-07-20 NOTE — Telephone Encounter (Signed)
Spider bite - seen 9/15 stacks - would like a refill on DOXY  Please address

## 2020-07-20 NOTE — Telephone Encounter (Signed)
Please let the patient know that I sent their prescription to their pharmacy. Thanks, WS 

## 2020-07-20 NOTE — Telephone Encounter (Signed)
°  Prescription Request  07/20/2020  What is the name of the medication or equipment? Doxyclycline  Have you contacted your pharmacy to request a refill? (if applicable) Yes  Which pharmacy would you like this sent to? [facility], [employer] says this Rx was just prescribed to her on 07/11/20 for a spider bite and says she needs a refill because her foot is still red from spider bite.   Patient notified that their request is being sent to the clinical staff for review and that they should receive a response within 2 business days.

## 2020-07-20 NOTE — Progress Notes (Signed)
Hello Lyrica,  Your lab result is normal and/or stable.Some minor variations that are not significant are commonly marked abnormal, but do not represent any medical problem for you.  Best regards, Claretta Fraise, M.D.

## 2020-07-20 NOTE — Telephone Encounter (Signed)
Left message to notify

## 2020-07-25 DIAGNOSIS — G894 Chronic pain syndrome: Secondary | ICD-10-CM | POA: Diagnosis not present

## 2020-07-25 DIAGNOSIS — Z79899 Other long term (current) drug therapy: Secondary | ICD-10-CM | POA: Diagnosis not present

## 2020-08-05 DIAGNOSIS — M62838 Other muscle spasm: Secondary | ICD-10-CM | POA: Diagnosis not present

## 2020-08-05 DIAGNOSIS — G8929 Other chronic pain: Secondary | ICD-10-CM | POA: Diagnosis not present

## 2020-08-06 DIAGNOSIS — C773 Secondary and unspecified malignant neoplasm of axilla and upper limb lymph nodes: Secondary | ICD-10-CM | POA: Diagnosis not present

## 2020-08-06 DIAGNOSIS — C7951 Secondary malignant neoplasm of bone: Secondary | ICD-10-CM | POA: Diagnosis not present

## 2020-08-06 DIAGNOSIS — Z5112 Encounter for antineoplastic immunotherapy: Secondary | ICD-10-CM | POA: Diagnosis not present

## 2020-08-06 DIAGNOSIS — C50912 Malignant neoplasm of unspecified site of left female breast: Secondary | ICD-10-CM | POA: Diagnosis not present

## 2020-08-11 ENCOUNTER — Other Ambulatory Visit: Payer: Self-pay | Admitting: Family Medicine

## 2020-08-21 DIAGNOSIS — C7951 Secondary malignant neoplasm of bone: Secondary | ICD-10-CM | POA: Diagnosis not present

## 2020-08-21 DIAGNOSIS — Z5112 Encounter for antineoplastic immunotherapy: Secondary | ICD-10-CM | POA: Diagnosis not present

## 2020-08-21 DIAGNOSIS — C50912 Malignant neoplasm of unspecified site of left female breast: Secondary | ICD-10-CM | POA: Diagnosis not present

## 2020-08-21 DIAGNOSIS — C773 Secondary and unspecified malignant neoplasm of axilla and upper limb lymph nodes: Secondary | ICD-10-CM | POA: Diagnosis not present

## 2020-08-23 DIAGNOSIS — G894 Chronic pain syndrome: Secondary | ICD-10-CM | POA: Diagnosis not present

## 2020-08-23 DIAGNOSIS — Z79899 Other long term (current) drug therapy: Secondary | ICD-10-CM | POA: Diagnosis not present

## 2020-08-24 ENCOUNTER — Ambulatory Visit (INDEPENDENT_AMBULATORY_CARE_PROVIDER_SITE_OTHER): Payer: Medicare HMO

## 2020-08-24 ENCOUNTER — Other Ambulatory Visit: Payer: Self-pay

## 2020-08-24 ENCOUNTER — Ambulatory Visit (INDEPENDENT_AMBULATORY_CARE_PROVIDER_SITE_OTHER): Payer: Medicare HMO | Admitting: Family Medicine

## 2020-08-24 ENCOUNTER — Encounter: Payer: Self-pay | Admitting: Family Medicine

## 2020-08-24 VITALS — BP 139/71 | HR 80 | Temp 97.2°F | Ht 64.0 in | Wt 228.6 lb

## 2020-08-24 DIAGNOSIS — S0993XA Unspecified injury of face, initial encounter: Secondary | ICD-10-CM | POA: Diagnosis not present

## 2020-08-24 DIAGNOSIS — R55 Syncope and collapse: Secondary | ICD-10-CM | POA: Diagnosis not present

## 2020-08-24 DIAGNOSIS — Z23 Encounter for immunization: Secondary | ICD-10-CM | POA: Diagnosis not present

## 2020-08-24 DIAGNOSIS — S59901A Unspecified injury of right elbow, initial encounter: Secondary | ICD-10-CM | POA: Diagnosis not present

## 2020-08-24 DIAGNOSIS — M25521 Pain in right elbow: Secondary | ICD-10-CM

## 2020-08-24 DIAGNOSIS — M7989 Other specified soft tissue disorders: Secondary | ICD-10-CM | POA: Diagnosis not present

## 2020-08-24 LAB — HEMOGLOBIN, FINGERSTICK: Hemoglobin: 11.7 g/dL (ref 11.1–15.9)

## 2020-08-24 NOTE — Patient Instructions (Addendum)
Syncope Syncope is when you pass out (faint) for a short time. It is caused by a sudden decrease in blood flow to the brain. Signs that you may be about to pass out include:  Feeling dizzy or light-headed.  Feeling sick to your stomach (nauseous).  Seeing all white or all black.  Having cold, clammy skin. If you pass out, get help right away. Call your local emergency services (911 in the [place] Do not drive yourself to the hospital. Follow these instructions at home: Watch for any changes in your symptoms. Take these actions to stay safe and help with your symptoms: Lifestyle  Do not drive, use machinery, or play sports until your doctor says it is okay.  Do not drink alcohol.  Do not use any products that contain nicotine or tobacco, such as cigarettes and e-cigarettes. If you need help quitting, ask your doctor.  Drink enough fluid to keep your pee (urine) pale yellow. General instructions  Take over-the-counter and prescription medicines only as told by your doctor.  If you are taking blood pressure or heart medicine, sit up and stand up slowly. Spend a few minutes getting ready to sit and then stand. This can help you feel less dizzy.  Have someone stay with you until you feel stable.  If you start to feel like you might pass out, lie down right away and raise (elevate) your feet above the level of your heart. Breathe deeply and steadily. Wait until all of the symptoms are gone.  Keep all follow-up visits as told by your doctor. This is important. Get help right away if:  You have a very bad headache.  You pass out once or more than once.  You have pain in your chest, belly, or back.  You have a very fast or uneven heartbeat (palpitations).  It hurts to breathe.  You are bleeding from your mouth or your bottom (rectum).  You have black or tarry poop (stool).  You have jerky movements that you cannot control (seizure).  You are confused.  You have trouble  walking.  You are very weak.  You have vision problems. These symptoms may be an emergency. Do not wait to see if the symptoms will go away. Get medical help right away. Call your local emergency services (911 in the [place] Do not drive yourself to the hospital. Summary  Syncope is when you pass out (faint) for a short time. It is caused by a sudden decrease in blood flow to the brain.  Signs that you may be about to faint include feeling dizzy, light-headed, or sick to your stomach, seeing all white or all black, or having cold, clammy skin.  If you start to feel like you might pass out, lie down right away and raise (elevate) your feet above the level of your heart. Breathe deeply and steadily. Wait until all of the symptoms are gone. This information is not intended to replace advice given to you by your health care provider. Make sure you discuss any questions you have with your health care provider. Document Revised: 11/25/2017 Document Reviewed: 11/25/2017 Elsevier Patient Education  [place].  Contusion A contusion is a deep bruise. This is a result of an injury that causes bleeding under the skin. Symptoms of bruising include pain, swelling, and discolored skin. The skin may turn blue, purple, or yellow. Follow these instructions at home: Managing pain, stiffness, and swelling You may use RICE. This stands for:  Resting.  Icing.  Compression, or putting pressure.  Elevating, or raising the injured area. To follow this method, do these actions:  Rest the injured area.  If told, put ice on the injured area. ? Put ice in a plastic bag. ? Place a towel between your skin and the bag. ? Leave the ice on for 20 minutes, 2-3 times per day.  If told, put light pressure (compression) on the injured area using an elastic bandage. Make sure the bandage is not too tight. If the area tingles or becomes numb, remove it and put it back on as told by your doctor.  If  possible, raise (elevate) the injured area above the level of your heart while you are sitting or lying down.  General instructions  Take over-the-counter and prescription medicines only as told by your doctor.  Keep all follow-up visits as told by your doctor. This is important. Contact a doctor if:  Your symptoms do not get better after several days of treatment.  Your symptoms get worse.  You have trouble moving the injured area. Get help right away if:  You have very bad pain.  You have a loss of feeling (numbness) in a hand or foot.  Your hand or foot turns pale or cold. Summary  A contusion is a deep bruise. This is a result of an injury that causes bleeding under the skin.  Symptoms of bruising include pain, swelling, and discolored skin. The skin may turn blue, purple, or yellow.  This condition is treated with rest, ice, compression, and elevation. This is also called RICE. You may be given over-the-counter medicines for pain.  Contact a doctor if you do not feel better, or you feel worse. Get help right away if you have very bad pain, have lost feeling in a hand or foot, or the area turns pale or cold. This information is not intended to replace advice given to you by your health care provider. Make sure you discuss any questions you have with your health care provider. Document Revised: 06/04/2018 Document Reviewed: 06/04/2018 Elsevier Patient Education  [place].

## 2020-08-24 NOTE — Progress Notes (Signed)
Subjective: CC: syncope, injury PCP: Claretta Fraise, MD DVV:OHYWV Diana Mathews is a 69 y.o. female presenting to clinic today for:  1. Syncope, injury Patient reports that she became nauseous and went to the bathroom and subsequently woke up on the floor on the 24th of the month.  She did hit her head and has had bruising on the side of her face and right arm.  She still continues to have some intermittent right arm pain but does have full use of the right arm.  She is anticoagulated for atrial fibrillation.  She did seek care in the emergency department but it was "a 4-hour wait and she did not feel good" and therefore she left.  She reports not having a heart doctor.  She has not discussed this issue with her PCP nor her oncologist.  Denies any dizziness, nausea, heart palpitations or blurred vision.  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   Controlled type 2 diabetes mellitus with complication, without long-term current use of insulin ([place]) 07/06/2018   Depression    pt taking prozac   Headache    HLD (hyperlipidemia) 08/26/2016   Hypertension    New onset atrial fibrillation ([place]) 06/12/2018   Sleep apnea    pt is supposed to be on CPAP   Stroke [facility])     Current Outpatient Medications:    Accu-Chek FastClix Lancets MISC, USE 4 TIMES DAILY, Disp: 306 each, Rfl: 3   ACCU-CHEK GUIDE test strip, 4 TIMES DAILY, Disp: 400 each, Rfl: 3   apixaban (ELIQUIS) 5 MG TABS tablet, Take 1 tablet (5 mg total) by mouth 2 (two) times daily., Disp: 180 tablet, Rfl: 1   ARIPiprazole (ABILIFY) 5 MG tablet, Take 1 tablet (5 mg total) by mouth daily., Disp: 90 tablet, Rfl: 1   atorvastatin (LIPITOR) 80 MG tablet, TAKE ONE (1) TABLET EACH DAY, Disp:  90 tablet, Rfl: 1   BELSOMRA 15 MG TABS, TAKE 1 TABLET AT BEDTIME FOR SLEEP, Disp: 30 tablet, Rfl: 5   carvedilol (COREG) 3.125 MG tablet, TAKE 1 AND 1/2 TABLET TWICE A DAY, Disp: 270 tablet, Rfl: 1   dapsone 100 MG tablet, Take 1 tablet (100 mg total) by mouth daily., Disp: 10 tablet, Rfl: 0   diltiazem (CARDIZEM CD) 240 MG 24 hr capsule, Take 1 capsule (240 mg total) by mouth 2 (two) times daily., Disp: 180 capsule, Rfl: 1   diphenhydrAMINE (BENADRYL) 25 mg capsule, Take 25 mg by mouth at bedtime as needed., Disp: , Rfl:    doxycycline (VIBRAMYCIN) 100 MG capsule, Take 1 capsule (100 mg total) by mouth 2 (two) times daily., Disp: 20 capsule, Rfl: 0   HYDROcodone-acetaminophen (NORCO) 10-325 MG tablet, Take 1 tablet by mouth every 6 (six) hours as needed., Disp: , Rfl:    metFORMIN (GLUCOPHAGE-XR) 750 MG 24 hr tablet, Take One at breakfast and two with supper, Disp: 270 tablet, Rfl: 1   mirtazapine (REMERON) 15 MG tablet, Take 1 tablet (15 mg total) by mouth at bedtime., Disp: 30 tablet, Rfl: 3   nabumetone (RELAFEN) 500 MG tablet, TAKE TWO TABLETS BY MOUTH TWICE DAILY, Disp: 120 tablet, Rfl: 0   sitaGLIPtin (JANUVIA) 100 MG tablet, TAKE ONE (1)  TABLET EACH DAY, Disp: 90 tablet, Rfl: 0   tiZANidine (ZANAFLEX) 4 MG tablet, TAKE 1 TABLET EVERY 6 HOURS AS NEEDED FOR MUSCLE SPASMS, Disp: 30 tablet, Rfl: 2   venlafaxine XR (EFFEXOR-XR) 75 MG 24 hr capsule, Take 1 capsule (75 mg total) by mouth 2 (two) times daily., Disp: 180 capsule, Rfl: 1   palbociclib (IBRANCE) 125 MG tablet, Take by mouth. (Patient not taking: Reported on 08/24/2020), Disp: , Rfl:  Social History   Socioeconomic History   Marital status: Divorced    Spouse name: Not on file   Number of children: 3   Years of education: 13   Highest education level: High school graduate  Occupational History   Occupation: Retired  Tobacco Use   Smoking status: Former Smoker    Types: Cigarettes    Quit date: 01/09/1985     Years since quitting: 35.6   Smokeless tobacco: Never Used  Vaping Use   Vaping Use: Never used  Substance and Sexual Activity   Alcohol use: No    Alcohol/week: 0.0 standard drinks   Drug use: No   Sexual activity: Not Currently    Birth control/protection: Surgical  Other Topics Concern   Not on file  Social History Narrative   Not on file   Social Determinants of Health   Financial Resource Strain:    Difficulty of Paying Living Expenses: Not on file  Food Insecurity:    Worried About Charity fundraiser in the Last Year: Not on file   [place] in the Last Year: Not on file  Transportation Needs:    Lack of Transportation (Medical): Not on file   Lack of Transportation (Non-Medical): Not on file  Physical Activity:    Days of Exercise per Week: Not on file   Minutes of Exercise per Session: Not on file  Stress:    Feeling of Stress : Not on file  Social Connections:    Frequency of Communication with Friends and Family: Not on file   Frequency of Social Gatherings with Friends and Family: Not on file   Attends Religious Services: Not on file   Active Member of Clubs or Organizations: Not on file   Attends Archivist Meetings: Not on file   Marital Status: Not on file  Intimate Partner Violence:    Fear of Current or Ex-Partner: Not on file   Emotionally Abused: Not on file   Physically Abused: Not on file   Sexually Abused: Not on file   Family History  Problem Relation Age of Onset   Arthritis Mother    COPD Mother    Diabetes Mother    Hypertension Mother    Arthritis Sister    Diabetes Brother    Arthritis Sister    Diabetes Sister    Diabetes Sister    Diabetes Brother    Mental illness Brother     Objective: Office vital signs reviewed. BP 139/71    Pulse 80    Temp (!) 97.2 F (36.2 C)    Ht _0  (1.626 m)    Wt 228 lb 9.6 oz (103.7 kg)    SpO2 96%    BMI 39.24 kg/m   Physical  Examination:  General: Awake, alert, somewhat sweaty appearing, No acute distress HEENT: bruising along the inferior orbit, right lip and nasal bridge with hemostatic abrasions; PERRL, EOMI Cardio: regular rate and rhythm, S1S2 heard, no murmurs appreciated Pulm: clear to auscultation bilaterally, no wheezes, rhonchi  or rales; normal work of breathing on room air Extremities: warm, well perfused, No edema, cyanosis or clubbing; +2 pulses bilaterally MSK ambulating independently.  Right elbow with diffuse ecchymosis and mild induration.  She has soft tissue swelling noted over the olecranon and slightly into the forearm.  There is minimal tenderness palpation.  No skin breakdown.  She has full extension, flexion and rotation of the elbow/forearm Neuro: Alert and oriented, cranial nerves II through XII grossly intact.  Response to questions appropriately.  No focal neurologic deficits   Assessment/ Plan: 69 y.o. female   1. Syncope and collapse We discussed the emergent nature of head injuries and syncope, particularly in the setting of chronic anticoagulation.  I advised her that in the future should she have this type of experience she be checked out by the emergency department.  Lack of evaluation could have led to a life-threatening brain bleed or internal bleeding.  I think that at this point the likelihood of a undiagnosed brain bleed is probably unlikely but will proceed with CT of the head given use of anticoagulant and bruising of along the orbit.  EKG was obtained today which did not demonstrate any arrhythmias.  I placed a referral to cardiology given her history of A. fib with RVR and now a syncopal episode.  I think it pertinent that she establish with a heart specialist.  Hemoglobin, electrolytes and TSH were obtained today. - EKG 12-Lead - Ambulatory referral to Cardiology - CT Head Wo Contrast; Future - Hemoglobin, fingerstick - CMP14+EGFR - TSH - Magnesium  2. Facial injury,  initial encounter As above - CT Head Wo Contrast; Future - Hemoglobin, fingerstick  3. Injury of right elbow, initial encounter Likely contusion but will obtain an x-ray given the amount of swelling and bruising appreciated. - Hemoglobin, fingerstick - DG Elbow 2 Views Right; Future   Orders Placed This Encounter  Procedures   EKG 12-Lead   No orders of the defined types were placed in this encounter.    Janora Norlander, DO [place] [phone]

## 2020-08-25 LAB — CMP14+EGFR
ALT: 86 IU/L — ABNORMAL HIGH (ref 0–32)
AST: 56 IU/L — ABNORMAL HIGH (ref 0–40)
Albumin/Globulin Ratio: 1.5 (ref 1.2–2.2)
Albumin: 4.6 g/dL (ref 3.8–4.8)
Alkaline Phosphatase: 143 IU/L — ABNORMAL HIGH (ref 44–121)
BUN/Creatinine Ratio: 16 (ref 12–28)
BUN: 10 mg/dL (ref 8–27)
Bilirubin Total: 0.6 mg/dL (ref 0.0–1.2)
CO2: 26 mmol/L (ref 20–29)
Calcium: 9.5 mg/dL (ref 8.7–10.3)
Chloride: 98 mmol/L (ref 96–106)
Creatinine, Ser: 0.62 mg/dL (ref 0.57–1.00)
GFR calc Af Amer: 109 mL/min/{1.73_m2} (ref >59)
GFR calc non Af Amer: 94 mL/min/{1.73_m2} (ref >59)
Globulin, Total: 3 g/dL (ref 1.5–4.5)
Glucose: 151 mg/dL — ABNORMAL HIGH (ref 65–99)
Potassium: 4.4 mmol/L (ref 3.5–5.2)
Sodium: 139 mmol/L (ref 134–144)
Total Protein: 7.6 g/dL (ref 6.0–8.5)

## 2020-08-25 LAB — MAGNESIUM: Magnesium: 1.5 mg/dL — ABNORMAL LOW (ref 1.6–2.3)

## 2020-08-25 LAB — TSH: TSH: 0.698 u[IU]/mL (ref 0.450–4.500)

## 2020-08-28 ENCOUNTER — Ambulatory Visit (HOSPITAL_COMMUNITY)
Admission: RE | Admit: 2020-08-28 | Discharge: 2020-08-28 | Disposition: A | Discharge status: Home / Self Care | Payer: Medicare HMO | Source: Ambulatory Visit | Attending: Family Medicine | Admitting: Family Medicine

## 2020-08-28 ENCOUNTER — Other Ambulatory Visit: Payer: Self-pay

## 2020-08-28 DIAGNOSIS — R519 Headache, unspecified: Secondary | ICD-10-CM | POA: Diagnosis not present

## 2020-08-28 DIAGNOSIS — S0993XA Unspecified injury of face, initial encounter: Secondary | ICD-10-CM | POA: Insufficient documentation

## 2020-08-28 DIAGNOSIS — R55 Syncope and collapse: Secondary | ICD-10-CM

## 2020-08-30 ENCOUNTER — Encounter: Payer: Self-pay | Admitting: Cardiology

## 2020-08-30 ENCOUNTER — Telehealth: Payer: Self-pay | Admitting: Cardiology

## 2020-08-30 ENCOUNTER — Ambulatory Visit (INDEPENDENT_AMBULATORY_CARE_PROVIDER_SITE_OTHER): Payer: Medicare HMO | Admitting: Cardiology

## 2020-08-30 ENCOUNTER — Ambulatory Visit (INDEPENDENT_AMBULATORY_CARE_PROVIDER_SITE_OTHER): Payer: Medicare HMO

## 2020-08-30 ENCOUNTER — Other Ambulatory Visit: Payer: Self-pay

## 2020-08-30 VITALS — BP 143/82 | HR 86 | Ht 63.0 in | Wt 229.4 lb

## 2020-08-30 DIAGNOSIS — R55 Syncope and collapse: Secondary | ICD-10-CM

## 2020-08-30 DIAGNOSIS — I480 Paroxysmal atrial fibrillation: Secondary | ICD-10-CM | POA: Diagnosis not present

## 2020-08-30 DIAGNOSIS — C7951 Secondary malignant neoplasm of bone: Secondary | ICD-10-CM | POA: Diagnosis not present

## 2020-08-30 DIAGNOSIS — E782 Mixed hyperlipidemia: Secondary | ICD-10-CM | POA: Diagnosis not present

## 2020-08-30 DIAGNOSIS — C773 Secondary and unspecified malignant neoplasm of axilla and upper limb lymph nodes: Secondary | ICD-10-CM | POA: Diagnosis not present

## 2020-08-30 DIAGNOSIS — C50912 Malignant neoplasm of unspecified site of left female breast: Secondary | ICD-10-CM | POA: Diagnosis not present

## 2020-08-30 NOTE — Progress Notes (Signed)
Cardiology Office Note  Date: 08/30/2020   ID: Alashia, Brownfield 1951-05-17, MRN [id number]  PCP:  Claretta Fraise, MD  Cardiologist:  Rozann Lesches, MD Electrophysiologist:  None   Chief Complaint  Patient presents with  . Establish cardiac follow-up    History of Present Illness: Diana Mathews is a 69 y.o. female referred for cardiology consultation by Dr. Lajuana Ripple with history of paroxysmal atrial fibrillation and a more recent episode of syncope.  She tells me that this past Sunday she sat up and got out of bed to go to the restroom, while she was walking apparently had a sudden episode of syncope.  She does not recall any specific prodrome, was having no pain or discomfort, no sense of palpitations or chest pain.  She woke up on the floor.  She did have some superficial injuries, no acute event by head CT.  She states that she has been at baseline since that time.  She has never had a prior similar episode.  Atrial fibrillation was documented during hospitalization in August 2019, patient managed on the hospitalist service and at that point was initiated on anticoagulation.  Echocardiogram from that time is outlined below.  She does not report any sense of palpitations at baseline.  CHA2DS2-VASc score is 5.  She is on Eliquis for stroke prophylaxis.  I reviewed her recent lab work as noted below.  She has had no spontaneous bleeding problems.  Past Medical History:  Diagnosis Date  . Atrial fibrillation ([place]) 06/12/2018  . Depression   . Essential hypertension   . Headache   . Hyperlipidemia   . Metastatic breast cancer ([place])    Left breast, metastatic to bone and with pulmonary nodules - [facility]  . Osteoarthritis   . Sleep apnea   . TIA (transient ischemic attack)   . Type 2 diabetes mellitus ([place])     Past Surgical History:  Procedure Laterality Date  . ABDOMINAL HYSTERECTOMY    . CHOLECYSTECTOMY    . FOOT SURGERY Left   . HERNIA REPAIR    .  TUBAL LIGATION    . WRIST SURGERY Bilateral    Carpal Tunnel    Current Outpatient Medications  Medication Sig Dispense Refill  . Accu-Chek FastClix Lancets MISC USE 4 TIMES DAILY 306 each 3  . ACCU-CHEK GUIDE test strip 4 TIMES DAILY 400 each 3  . apixaban (ELIQUIS) 5 MG TABS tablet Take 1 tablet (5 mg total) by mouth 2 (two) times daily. 180 tablet 1  . ARIPiprazole (ABILIFY) 5 MG tablet Take 1 tablet (5 mg total) by mouth daily. 90 tablet 1  . atorvastatin (LIPITOR) 80 MG tablet TAKE ONE (1) TABLET EACH DAY 90 tablet 1  . BELSOMRA 15 MG TABS TAKE 1 TABLET AT BEDTIME FOR SLEEP 30 tablet 5  . carvedilol (COREG) 3.125 MG tablet TAKE 1 AND 1/2 TABLET TWICE A DAY 270 tablet 1  . dapsone 100 MG tablet Take 1 tablet (100 mg total) by mouth daily. 10 tablet 0  . diltiazem (CARDIZEM CD) 240 MG 24 hr capsule Take 1 capsule (240 mg total) by mouth 2 (two) times daily. 180 capsule 1  . diphenhydrAMINE (BENADRYL) 25 mg capsule Take 25 mg by mouth at bedtime as needed.    . doxycycline (VIBRAMYCIN) 100 MG capsule Take 1 capsule (100 mg total) by mouth 2 (two) times daily. 20 capsule 0  . HYDROcodone-acetaminophen (NORCO) 10-325 MG tablet Take 1 tablet by mouth every 6 (six)  hours as needed.    . metFORMIN (GLUCOPHAGE-XR) 750 MG 24 hr tablet Take One at breakfast and two with supper 270 tablet 1  . mirtazapine (REMERON) 15 MG tablet Take 1 tablet (15 mg total) by mouth at bedtime. 30 tablet 3  . nabumetone (RELAFEN) 500 MG tablet TAKE TWO TABLETS BY MOUTH TWICE DAILY 120 tablet 0  . sitaGLIPtin (JANUVIA) 100 MG tablet TAKE ONE (1) TABLET EACH DAY 90 tablet 0  . tiZANidine (ZANAFLEX) 4 MG tablet TAKE 1 TABLET EVERY 6 HOURS AS NEEDED FOR MUSCLE SPASMS 30 tablet 2  . venlafaxine XR (EFFEXOR-XR) 75 MG 24 hr capsule Take 1 capsule (75 mg total) by mouth 2 (two) times daily. 180 capsule 1  . palbociclib (IBRANCE) 125 MG tablet Take by mouth.  (Patient not taking: Reported on 08/30/2020)     No current  facility-administered medications for this visit.   Allergies:  Codeine   Social History: The patient  reports that she quit smoking about 35 years ago. Her smoking use included cigarettes. She has never used smokeless tobacco. She reports that she does not drink alcohol and does not use drugs.   Family History: The patient's family history includes Arthritis in her mother, sister, and sister; COPD in her mother; Diabetes in her brother, brother, mother, sister, and sister; Hypertension in her mother; Mental illness in her brother.   ROS: No orthopnea or PND.  Physical Exam: VS:  BP (!) 143/82 (BP Location: Right Arm, Cuff Size: Large)   Pulse 86   Ht 5\' 3"  (1.6 m)   Wt 229 lb 6.4 oz (104.1 kg)   SpO2 98%   BMI 40.64 kg/m , BMI Body mass index is 40.64 kg/m.  Wt Readings from Last 3 Encounters:  08/30/20 229 lb 6.4 oz (104.1 kg)  08/24/20 228 lb 9.6 oz (103.7 kg)  07/11/20 227 lb (103 kg)    General: Patient appears comfortable at rest. HEENT: Conjunctiva and lids normal, wearing a mask.  Resolving facial ecchymoses. Neck: Supple, no elevated JVP or carotid bruits, no thyromegaly. Lungs: Clear to auscultation, nonlabored breathing at rest. Cardiac: Regular rate and rhythm, no S3, 2/6 systolic murmur. Abdomen: Soft, nontender, bowel sounds present. Extremities: No pitting edema, distal pulses 2+.  Resolving ecchymoses right arm. Skin: Warm and dry. Musculoskeletal: No kyphosis. Neuropsychiatric: Alert and oriented x3, affect grossly appropriate.  ECG:  An ECG dated 08/24/2020 was personally reviewed today and demonstrated:  Sinus rhythm with decreased R wave progression.  Recent Labwork: 07/11/2020: Hemoglobin 12.6; Platelets 214 08/24/2020: ALT 86; AST 56; BUN 10; Creatinine, Ser 0.62; Magnesium 1.5; Potassium 4.4; Sodium 139; TSH 0.698     Component Value Date/Time   CHOL 189 07/11/2020 0941   TRIG 176 (H) 07/11/2020 0941   HDL 69 07/11/2020 0941   CHOLHDL 2.7 07/11/2020  0941   CHOLHDL 5.0 06/13/2018 0246   VLDL 62 (H) 06/13/2018 0246   LDLCALC 90 07/11/2020 0941   LDLDIRECT 144 (H) 07/25/2016 1536    Other Studies Reviewed Today:  Echocardiogram 06/13/2018: - Left ventricle: The cavity size was normal. There was mild  concentric hypertrophy. Systolic function was normal. The  estimated ejection fraction was in the range of 60% to 65%. Wall  motion was normal; there were no regional wall motion  abnormalities. Doppler parameters are consistent with abnormal  left ventricular relaxation (grade 1 diastolic dysfunction).  There was no evidence of elevated ventricular filling pressure by  Doppler parameters.  - Aortic valve: There was  no regurgitation.  - Aortic root: The aortic root was normal in size.  - Mitral valve: There was trivial regurgitation.  - Left atrium: The atrium was mildly dilated.  - Right ventricle: The cavity size was normal. Wall thickness was  normal. Systolic function was normal.  - Right atrium: The atrium was normal in size.  - Tricuspid valve: There was no regurgitation.  - Pulmonary arteries: Systolic pressure could not be accurately  estimated.  - Inferior vena cava: The vessel was normal in size.  - Pericardium, extracardiac: There was no pericardial effusion.   Assessment and Plan:  1.  Episode of syncope as outlined above, no obvious prodrome to necessarily implicate neurocardiogenic syncope.  Did occur after standing up however.  She does not report any baseline history of recurring palpitations or orthostatic lightheadedness.  Plan will be to obtain a 7-day cardiac monitor to assess heart rate variability and rhythm.  If no events are uncovered, would continue with strategy of observation for now.  2.  Paroxysmal atrial fibrillation by history, CHA2DS2-VASc score is 5.  She is on Eliquis for stroke prophylaxis.  Recent follow-up head CT after fall showed no acute findings.  She is on Cardizem CD and  reports no palpitations at baseline.  Recent ECG shows normal sinus rhythm.  3.  Mixed hyperlipidemia, on Lipitor.  Recent LDL 90.  Medication Adjustments/Labs and Tests Ordered: Current medicines are reviewed at length with the patient today.  Concerns regarding medicines are outlined above.   Tests Ordered: Orders Placed This Encounter  Procedures  . LONG TERM MONITOR (3-14 DAYS)    Medication Changes: No orders of the defined types were placed in this encounter.   Disposition:  Follow up 6 months in the [place] office.  Signed, Satira Sark, MD, [facility] 08/30/2020 3:26 PM    [facility] at [place], [place], [place] [address] Phone: [phone]; Fax: [phone]

## 2020-08-30 NOTE — Telephone Encounter (Signed)
Pre-cert Verification for the following procedure    7 Day ZIO XT dx: syncope

## 2020-08-30 NOTE — Patient Instructions (Addendum)
Medication Instructions:    Your physician recommends that you continue on your current medications as directed. Please refer to the Current Medication list given to you today.  Labwork:  None  Testing/Procedures: ZIO- Long Term Monitor Instructions   Your physician has requested you wear your ZIO patch monitor 7 days.   This is a single patch monitor.  [employer] supplies one patch monitor per enrollment.  Additional stickers are not available.   Please do not apply patch if you will be having a Nuclear Stress Test, Echocardiogram, Cardiac CT, MRI, or Chest Xray during the time frame you would be wearing the monitor. The patch cannot be worn during these tests.  You cannot remove and re-apply the ZIO XT patch monitor.    Once you have received you monitor, please review enclosed instructions.  Your monitor has already been registered assigning a specific monitor serial # to you.   Applying the monitor   Shave hair from upper left chest.   Hold abrader disc by orange tab.  Rub abrader in 40 strokes over left upper chest as indicated in your monitor instructions.   Clean area with 4 enclosed alcohol pads .  Use all pads to assure are is cleaned thoroughly.  Let dry.   Apply patch as indicated in monitor instructions.  Patch will be place under collarbone on left side of chest with arrow pointing upward.   Rub patch adhesive wings for 2 minutes.Remove white label marked "1".  Remove white label marked "2".  Rub patch adhesive wings for 2 additional minutes.   While looking in a mirror, press and release button in center of patch.  A small green light will flash 3-4 times .  This will be your only indicator the monitor has been turned on.     Do not shower for the first 24 hours.  You may shower after the first 24 hours.   Press button if you feel a symptom. You will hear a small click.  Record Date, Time and Symptom in the Patient Log Book.   When you are ready to remove patch,  follow instructions on last 2 pages of Patient Log Book.  Stick patch monitor onto last page of Patient Log Book.   Place Patient Log Book in [employer] box.  Use locking tab on box and tape box closed securely.  The [employer] has [employer] on it.  Please place in mailbox as soon as possible.  Your physician should have your test results approximately 7 days after the monitor has been mailed back to [facility].   Call [facility] at [phone] if you have questions regarding your ZIO XT patch monitor.  Call them immediately if you see an orange light blinking on your monitor.    If your monitor falls off in less than 4 days contact our Monitor department at [phone].  If your monitor becomes loose or falls off after 4 days call [employer] at [phone] for suggestions on securing your monitor.  Follow-Up:  Your physician recommends that you schedule a follow-up appointment in: 6 months.   Any Other Special Instructions Will Be Listed Below (If Applicable).  If you need a refill on your cardiac medications before your next appointment, please call your pharmacy.

## 2020-09-05 DIAGNOSIS — G8929 Other chronic pain: Secondary | ICD-10-CM | POA: Diagnosis not present

## 2020-09-05 DIAGNOSIS — M62838 Other muscle spasm: Secondary | ICD-10-CM | POA: Diagnosis not present

## 2020-09-07 DIAGNOSIS — C50912 Malignant neoplasm of unspecified site of left female breast: Secondary | ICD-10-CM | POA: Diagnosis not present

## 2020-09-07 DIAGNOSIS — C7951 Secondary malignant neoplasm of bone: Secondary | ICD-10-CM | POA: Diagnosis not present

## 2020-09-07 DIAGNOSIS — C773 Secondary and unspecified malignant neoplasm of axilla and upper limb lymph nodes: Secondary | ICD-10-CM | POA: Diagnosis not present

## 2020-09-12 ENCOUNTER — Telehealth: Payer: Self-pay | Admitting: Family Medicine

## 2020-09-12 NOTE — Telephone Encounter (Signed)
REFERRAL REQUEST Telephone Note  Have you been seen at our office for this problem? Yes (Advise that they may need an appointment with their PCP before a referral can be done)  Reason for Referral:  Referral discussed with patient: Yes Best contact number of patient for referral team:    Has patient been seen by a specialist for this issue before: NO Patient provider preference for referral: Patient would like Ref to [facility]  Patient location preference for referral:    Patient notified that referrals can take up to a week or longer to process. If they haven't heard anything within a week they should call back and speak with the referral department.

## 2020-09-12 NOTE — Telephone Encounter (Signed)
Pt. Needs to be seen for this. Thanks, WS 

## 2020-09-12 NOTE — Telephone Encounter (Signed)
Patient has appointment next month, states she will call back if she feels like she needs to be seen sooner

## 2020-09-13 DIAGNOSIS — R55 Syncope and collapse: Secondary | ICD-10-CM | POA: Diagnosis not present

## 2020-09-14 ENCOUNTER — Telehealth: Payer: Self-pay | Admitting: *Deleted

## 2020-09-14 NOTE — Telephone Encounter (Signed)
-----   Message from Satira Sark, MD sent at 09/14/2020  9:11 AM EST ----- Results reviewed.  Please let her know that the heart monitor did not correlate any specific arrhythmia with her history of syncope.  There were brief episodes of SVT/atrial tachycardia, but nothing prolonged, and no pauses.  Would continue with observation and keep scheduled follow-up for now.

## 2020-09-14 NOTE — Telephone Encounter (Signed)
Patient informed. Copy sent to PCP °

## 2020-09-21 DIAGNOSIS — C50912 Malignant neoplasm of unspecified site of left female breast: Secondary | ICD-10-CM | POA: Diagnosis not present

## 2020-09-21 DIAGNOSIS — E119 Type 2 diabetes mellitus without complications: Secondary | ICD-10-CM | POA: Diagnosis not present

## 2020-09-21 DIAGNOSIS — G894 Chronic pain syndrome: Secondary | ICD-10-CM | POA: Diagnosis not present

## 2020-09-21 DIAGNOSIS — Z79899 Other long term (current) drug therapy: Secondary | ICD-10-CM | POA: Diagnosis not present

## 2020-09-28 ENCOUNTER — Other Ambulatory Visit: Payer: Self-pay | Admitting: Family Medicine

## 2020-10-05 DIAGNOSIS — C7951 Secondary malignant neoplasm of bone: Secondary | ICD-10-CM | POA: Diagnosis not present

## 2020-10-05 DIAGNOSIS — C50912 Malignant neoplasm of unspecified site of left female breast: Secondary | ICD-10-CM | POA: Diagnosis not present

## 2020-10-05 DIAGNOSIS — C773 Secondary and unspecified malignant neoplasm of axilla and upper limb lymph nodes: Secondary | ICD-10-CM | POA: Diagnosis not present

## 2020-10-05 DIAGNOSIS — M62838 Other muscle spasm: Secondary | ICD-10-CM | POA: Diagnosis not present

## 2020-10-05 DIAGNOSIS — G8929 Other chronic pain: Secondary | ICD-10-CM | POA: Diagnosis not present

## 2020-10-11 ENCOUNTER — Telehealth: Payer: Self-pay

## 2020-10-11 ENCOUNTER — Encounter: Payer: Self-pay | Admitting: Family Medicine

## 2020-10-11 ENCOUNTER — Other Ambulatory Visit: Payer: Self-pay

## 2020-10-11 ENCOUNTER — Ambulatory Visit (INDEPENDENT_AMBULATORY_CARE_PROVIDER_SITE_OTHER): Payer: Medicare HMO | Admitting: Family Medicine

## 2020-10-11 VITALS — BP 134/71 | HR 86 | Temp 98.8°F | Ht 63.0 in | Wt 229.0 lb

## 2020-10-11 DIAGNOSIS — E118 Type 2 diabetes mellitus with unspecified complications: Secondary | ICD-10-CM | POA: Diagnosis not present

## 2020-10-11 DIAGNOSIS — I4811 Longstanding persistent atrial fibrillation: Secondary | ICD-10-CM

## 2020-10-11 DIAGNOSIS — E785 Hyperlipidemia, unspecified: Secondary | ICD-10-CM | POA: Diagnosis not present

## 2020-10-11 DIAGNOSIS — I10 Essential (primary) hypertension: Secondary | ICD-10-CM | POA: Diagnosis not present

## 2020-10-11 DIAGNOSIS — E1165 Type 2 diabetes mellitus with hyperglycemia: Secondary | ICD-10-CM | POA: Diagnosis not present

## 2020-10-11 LAB — BAYER DCA HB A1C WAIVED: HB A1C (BAYER DCA - WAIVED): 7.6 % — ABNORMAL HIGH (ref <7.0)

## 2020-10-11 MED ORDER — DULOXETINE HCL 60 MG PO CPEP
60.0000 mg | ORAL_CAPSULE | Freq: Two times a day (BID) | ORAL | 1 refills | Status: DC
Start: 2020-10-11 — End: 2021-03-08

## 2020-10-11 NOTE — Telephone Encounter (Signed)
Please let the patient know that I sent their prescription to their pharmacy. Thanks, WS 

## 2020-10-12 NOTE — Telephone Encounter (Signed)
Patient aware.

## 2020-10-16 ENCOUNTER — Other Ambulatory Visit: Payer: Self-pay | Admitting: Family Medicine

## 2020-10-16 NOTE — Telephone Encounter (Signed)
Pt was last seen by Dr. Livia Snellen on 10/11/20. Next OV is scheduled for 01/10/21. Please review for refill.

## 2020-10-18 ENCOUNTER — Encounter: Payer: Self-pay | Admitting: Family Medicine

## 2020-10-18 DIAGNOSIS — E119 Type 2 diabetes mellitus without complications: Secondary | ICD-10-CM | POA: Diagnosis not present

## 2020-10-18 DIAGNOSIS — G894 Chronic pain syndrome: Secondary | ICD-10-CM | POA: Diagnosis not present

## 2020-10-18 DIAGNOSIS — C50912 Malignant neoplasm of unspecified site of left female breast: Secondary | ICD-10-CM | POA: Diagnosis not present

## 2020-10-18 DIAGNOSIS — Z79899 Other long term (current) drug therapy: Secondary | ICD-10-CM | POA: Diagnosis not present

## 2020-10-18 MED ORDER — BUSPIRONE HCL 10 MG PO TABS: 10.0000 mg | ORAL_TABLET | Freq: Three times a day (TID) | ORAL | 1 refills | Status: DC | PRN

## 2020-10-18 NOTE — Progress Notes (Signed)
Subjective:  Patient ID: Diana Mathews,  female    DOB: 29-Jun-1951  Age: 69 y.o.    CC: Diabetes (3 mos/), Hypertension, and Hyperlipidemia   HPI Diana Mathews presents for  follow-up of hypertension. Patient has no history of headache chest pain or shortness of breath or recent cough. Patient also denies symptoms of TIA such as numbness weakness lateralizing. Patient denies side effects from medication. States taking it regularly.  Patient also  in for follow-up of elevated cholesterol. Doing well without complaints on current medication. Denies side effects 140-180 postprandial including myalgia and arthralgia and nausea. Also in today for liver function testing. Currently no chest pain, shortness of breath or other cardiovascular related symptoms noted.  Follow-up of diabetes. Patient does check blood sugar at home. Log reviewed. Attached. Readings 100-140 fasting . Trend for postprandial is 140-180Patient denies symptoms such as excessive hunger or urinary frequency, excessive hunger, nausea No significant hypoglycemic spells noted. Medications reviewed. Pt reports taking them regularly. Pt. denies complication/adverse reaction today.   Patient is followed by [facility] Mathews [facility] for Diana Mathews chronic back pain.  Depression screen [facility] 2/9 10/11/2020 08/24/2020 07/11/2020 04/10/2020 01/11/2020  Decreased Interest 2 0 0 0 0  Down, Depressed, Hopeless 3 0 0 0 3  PHQ - 2 Score 5 0 0 0 3  Altered sleeping 0 - - - 3  Tired, decreased energy 2 - - - 3  Change in appetite 2 - - - 0  Feeling bad or failure about yourself  2 - - - 0  Trouble concentrating 0 - - - 0  Moving slowly or fidgety/restless 0 - - - 0  Suicidal thoughts 0 - - - 0  PHQ-9 Score 11 - - - 9  Difficult doing work/chores - - - - Not difficult at all  Some recent data might be hidden      History Diana Mathews has a past medical history of Atrial fibrillation ([place]) (06/12/2018), Depression, Essential hypertension, Headache,  Hyperlipidemia, Metastatic breast cancer ([place]), Osteoarthritis, Sleep apnea, TIA (transient ischemic attack), and Type 2 diabetes mellitus ([place]).   Diana Mathews has a past surgical history that includes Abdominal hysterectomy; Wrist surgery (Bilateral); Hernia repair; Cholecystectomy; Tubal ligation; and Foot surgery (Left).   Diana Mathews family history includes Arthritis in Diana Mathews mother, sister, and sister; COPD in Diana Mathews mother; Diabetes in Diana Mathews brother, brother, mother, sister, and sister; Hypertension in Diana Mathews mother; Mental illness in Diana Mathews brother.Diana Mathews reports that Diana Mathews quit smoking about 35 years ago. Diana Mathews smoking use included cigarettes. Diana Mathews has never used smokeless tobacco. Diana Mathews reports that Diana Mathews does not drink alcohol and does not use drugs.  Current Outpatient Medications on File Prior to Visit  Medication Sig Dispense Refill  . Accu-Chek FastClix Lancets MISC USE 4 TIMES DAILY 306 each 3  . ACCU-CHEK GUIDE test strip 4 TIMES DAILY 400 each 3  . apixaban (ELIQUIS) 5 MG TABS tablet Take 1 tablet (5 mg total) by mouth 2 (two) times daily. 180 tablet 1  . ARIPiprazole (ABILIFY) 5 MG tablet Take 1 tablet (5 mg total) by mouth daily. 90 tablet 1  . atorvastatin (LIPITOR) 80 MG tablet TAKE ONE (1) TABLET EACH DAY 90 tablet 1  . carvedilol (COREG) 3.125 MG tablet TAKE 1 AND 1/2 TABLET TWICE A DAY 270 tablet 1  . dapsone 100 MG tablet Take 1 tablet (100 mg total) by mouth daily. 10 tablet 0  . diltiazem (CARDIZEM CD) 240 MG 24 hr capsule Take 1 capsule (240 mg  total) by mouth 2 (two) times daily. 180 capsule 1  . diphenhydrAMINE (BENADRYL) 25 mg capsule Take 25 mg by mouth at bedtime as needed.    . doxycycline (VIBRAMYCIN) 100 MG capsule Take 1 capsule (100 mg total) by mouth 2 (two) times daily. 20 capsule 0  . HYDROcodone-acetaminophen (NORCO) 10-325 MG tablet Take 1 tablet by mouth every 6 (six) hours as needed.    . metFORMIN (GLUCOPHAGE-XR) 750 MG 24 hr tablet Take One at breakfast and two with supper 270 tablet 1  .  mirtazapine (REMERON) 15 MG tablet Take 1 tablet (15 mg total) by mouth at bedtime. 30 tablet 3  . nabumetone (RELAFEN) 500 MG tablet TAKE TWO TABLETS BY MOUTH TWICE DAILY 120 tablet 0  . palbociclib (IBRANCE) 125 MG tablet Take by mouth.    . sitaGLIPtin (JANUVIA) 100 MG tablet TAKE ONE (1) TABLET EACH DAY 90 tablet 0  . tiZANidine (ZANAFLEX) 4 MG tablet TAKE 1 TABLET EVERY 6 HOURS AS NEEDED FOR MUSCLE SPASMS 30 tablet 2  . venlafaxine XR (EFFEXOR-XR) 75 MG 24 hr capsule Take 1 capsule (75 mg total) by mouth 2 (two) times daily. 180 capsule 1   No current facility-administered medications on file prior to visit.    ROS Review of Systems  Constitutional: Negative.   HENT: Negative.   Eyes: Negative for visual disturbance.  Respiratory: Negative for shortness of breath.   Cardiovascular: Negative for chest pain.  Gastrointestinal: Negative for abdominal pain.  Musculoskeletal: Positive for arthralgias and back pain.    Objective:  BP 134/71   Pulse 86   Temp 98.8 F (37.1 C) (Temporal)   Ht 5\' 3"  (1.6 m)   Wt 229 lb (103.9 kg)   BMI 40.57 kg/m   BP Readings from Last 3 Encounters:  10/11/20 134/71  08/30/20 (!) 143/82  08/24/20 139/71    Wt Readings from Last 3 Encounters:  10/11/20 229 lb (103.9 kg)  08/30/20 229 lb 6.4 oz (104.1 kg)  08/24/20 228 lb 9.6 oz (103.7 kg)     Physical Exam Constitutional:      General: Diana Mathews is not in acute distress.    Appearance: Diana Mathews is well-developed.  HENT:     Head: Normocephalic and atraumatic.  Eyes:     Conjunctiva/sclera: Conjunctivae normal.     Pupils: Pupils are equal, round, and reactive to light.  Neck:     Thyroid: No thyromegaly.  Cardiovascular:     Rate and Rhythm: Normal rate and regular rhythm.     Heart sounds: Normal heart sounds. No murmur heard.   Pulmonary:     Effort: Pulmonary effort is normal. No respiratory distress.     Breath sounds: Normal breath sounds. No wheezing or rales.  Abdominal:      General: Bowel sounds are normal. There is no distension.     Palpations: Abdomen is soft.     Tenderness: There is no abdominal tenderness.  Musculoskeletal:        General: Normal range of motion.     Cervical back: Normal range of motion and neck supple.  Lymphadenopathy:     Cervical: No cervical adenopathy.  Skin:    General: Skin is warm and dry.  Neurological:     Mental Status: Diana Mathews is alert and oriented to person, place, and time.  Psychiatric:        Behavior: Behavior normal.        Thought Content: Thought content normal.  Judgment: Judgment normal.     Diabetic Foot Exam - Simple   No data filed       Assessment & Plan:   Diana Mathews was seen today for diabetes, hypertension and hyperlipidemia.  Diagnoses and all orders for this visit:  Uncontrolled type 2 diabetes mellitus with hyperglycemia ([place]) -     Bayer DCA Hb A1c Waived  Longstanding persistent atrial fibrillation ([place])  Essential hypertension  Hyperlipidemia, unspecified hyperlipidemia type  Controlled type 2 diabetes mellitus with complication, without long-term current use of insulin ([place])  Other orders -     DULoxetine (CYMBALTA) 60 MG capsule; Take 1 capsule (60 mg total) by mouth in the morning and at bedtime. -     busPIRone (BUSPAR) 10 MG tablet; Take 1 tablet (10 mg total) by mouth 3 (three) times daily as needed.   I have changed Osker Mason. Buer's DULoxetine and busPIRone. I am also having Diana Mathews maintain Diana Mathews diphenhydrAMINE, Accu-Chek FastClix Lancets, Accu-Chek Guide, HYDROcodone-acetaminophen, nabumetone, palbociclib, ARIPiprazole, atorvastatin, apixaban, metFORMIN, diltiazem, mirtazapine, sitaGLIPtin, venlafaxine XR, dapsone, carvedilol, doxycycline, and tiZANidine.  Meds ordered this encounter  Medications  . DULoxetine (CYMBALTA) 60 MG capsule    Sig: Take 1 capsule (60 mg total) by mouth in the morning and at bedtime.    Dispense:  180 capsule    Refill:  1  . busPIRone  (BUSPAR) 10 MG tablet    Sig: Take 1 tablet (10 mg total) by mouth 3 (three) times daily as needed.    Dispense:  270 tablet    Refill:  1     Follow-up: Return in about 3 months (around 01/09/2021).  Claretta Fraise, M.D.

## 2020-10-30 ENCOUNTER — Other Ambulatory Visit: Payer: Self-pay | Admitting: Family Medicine

## 2020-10-30 DIAGNOSIS — E1165 Type 2 diabetes mellitus with hyperglycemia: Secondary | ICD-10-CM

## 2020-11-05 DIAGNOSIS — M62838 Other muscle spasm: Secondary | ICD-10-CM | POA: Diagnosis not present

## 2020-11-05 DIAGNOSIS — G8929 Other chronic pain: Secondary | ICD-10-CM | POA: Diagnosis not present

## 2020-11-07 DIAGNOSIS — C50912 Malignant neoplasm of unspecified site of left female breast: Secondary | ICD-10-CM | POA: Diagnosis not present

## 2020-11-07 DIAGNOSIS — C7951 Secondary malignant neoplasm of bone: Secondary | ICD-10-CM | POA: Diagnosis not present

## 2020-11-07 DIAGNOSIS — C773 Secondary and unspecified malignant neoplasm of axilla and upper limb lymph nodes: Secondary | ICD-10-CM | POA: Diagnosis not present

## 2020-11-15 ENCOUNTER — Other Ambulatory Visit: Payer: Self-pay | Admitting: Nurse Practitioner

## 2020-11-19 DIAGNOSIS — Z6838 Body mass index (BMI) 38.0-38.9, adult: Secondary | ICD-10-CM | POA: Diagnosis not present

## 2020-11-19 DIAGNOSIS — Z79899 Other long term (current) drug therapy: Secondary | ICD-10-CM | POA: Diagnosis not present

## 2020-11-19 DIAGNOSIS — E119 Type 2 diabetes mellitus without complications: Secondary | ICD-10-CM | POA: Diagnosis not present

## 2020-11-19 DIAGNOSIS — C50912 Malignant neoplasm of unspecified site of left female breast: Secondary | ICD-10-CM | POA: Diagnosis not present

## 2020-11-19 DIAGNOSIS — G894 Chronic pain syndrome: Secondary | ICD-10-CM | POA: Diagnosis not present

## 2020-12-06 DIAGNOSIS — M62838 Other muscle spasm: Secondary | ICD-10-CM | POA: Diagnosis not present

## 2020-12-06 DIAGNOSIS — C7951 Secondary malignant neoplasm of bone: Secondary | ICD-10-CM | POA: Diagnosis not present

## 2020-12-06 DIAGNOSIS — G8929 Other chronic pain: Secondary | ICD-10-CM | POA: Diagnosis not present

## 2020-12-20 DIAGNOSIS — Z6838 Body mass index (BMI) 38.0-38.9, adult: Secondary | ICD-10-CM | POA: Diagnosis not present

## 2020-12-20 DIAGNOSIS — C50912 Malignant neoplasm of unspecified site of left female breast: Secondary | ICD-10-CM | POA: Diagnosis not present

## 2020-12-20 DIAGNOSIS — E119 Type 2 diabetes mellitus without complications: Secondary | ICD-10-CM | POA: Diagnosis not present

## 2020-12-20 DIAGNOSIS — Z79899 Other long term (current) drug therapy: Secondary | ICD-10-CM | POA: Diagnosis not present

## 2020-12-20 DIAGNOSIS — G894 Chronic pain syndrome: Secondary | ICD-10-CM | POA: Diagnosis not present

## 2021-01-02 DIAGNOSIS — C7951 Secondary malignant neoplasm of bone: Secondary | ICD-10-CM | POA: Diagnosis not present

## 2021-01-02 DIAGNOSIS — C50912 Malignant neoplasm of unspecified site of left female breast: Secondary | ICD-10-CM | POA: Diagnosis not present

## 2021-01-02 DIAGNOSIS — E611 Iron deficiency: Secondary | ICD-10-CM | POA: Diagnosis not present

## 2021-01-02 DIAGNOSIS — D0511 Intraductal carcinoma in situ of right breast: Secondary | ICD-10-CM | POA: Diagnosis not present

## 2021-01-02 DIAGNOSIS — E118 Type 2 diabetes mellitus with unspecified complications: Secondary | ICD-10-CM | POA: Diagnosis not present

## 2021-01-02 DIAGNOSIS — Z5112 Encounter for antineoplastic immunotherapy: Secondary | ICD-10-CM | POA: Diagnosis not present

## 2021-01-02 DIAGNOSIS — I4811 Longstanding persistent atrial fibrillation: Secondary | ICD-10-CM | POA: Diagnosis not present

## 2021-01-02 DIAGNOSIS — I10 Essential (primary) hypertension: Secondary | ICD-10-CM | POA: Diagnosis not present

## 2021-01-02 DIAGNOSIS — Z6841 Body Mass Index (BMI) 40.0 and over, adult: Secondary | ICD-10-CM | POA: Diagnosis not present

## 2021-01-02 DIAGNOSIS — C773 Secondary and unspecified malignant neoplasm of axilla and upper limb lymph nodes: Secondary | ICD-10-CM | POA: Diagnosis not present

## 2021-01-02 DIAGNOSIS — D72820 Lymphocytosis (symptomatic): Secondary | ICD-10-CM | POA: Diagnosis not present

## 2021-01-03 DIAGNOSIS — M62838 Other muscle spasm: Secondary | ICD-10-CM | POA: Diagnosis not present

## 2021-01-03 DIAGNOSIS — G8929 Other chronic pain: Secondary | ICD-10-CM | POA: Diagnosis not present

## 2021-01-10 ENCOUNTER — Other Ambulatory Visit: Payer: Self-pay

## 2021-01-10 ENCOUNTER — Encounter: Payer: Self-pay | Admitting: Family Medicine

## 2021-01-10 ENCOUNTER — Ambulatory Visit (INDEPENDENT_AMBULATORY_CARE_PROVIDER_SITE_OTHER): Payer: Medicare HMO | Admitting: Family Medicine

## 2021-01-10 VITALS — BP 126/68 | HR 86 | Temp 97.2°F | Ht 63.0 in | Wt 228.4 lb

## 2021-01-10 DIAGNOSIS — E118 Type 2 diabetes mellitus with unspecified complications: Secondary | ICD-10-CM

## 2021-01-10 DIAGNOSIS — E1165 Type 2 diabetes mellitus with hyperglycemia: Secondary | ICD-10-CM

## 2021-01-10 LAB — BAYER DCA HB A1C WAIVED: HB A1C (BAYER DCA - WAIVED): 6.8 % (ref <7.0)

## 2021-01-10 MED ORDER — OZEMPIC (0.25 OR 0.5 MG/DOSE) 2 MG/1.5ML ~~LOC~~ SOPN: 0.5000 mg | PEN_INJECTOR | SUBCUTANEOUS | 2 refills | Status: DC

## 2021-01-10 NOTE — Progress Notes (Signed)
Subjective:  Patient ID: Diana Mathews,  female    DOB: Jun 22, 1951  Age: 70 y.o.    CC: Medical Management of Chronic Issues   HPI Diana Mathews presents for  follow-up of hypertension. Patient has no history of headache chest pain or shortness of breath or recent cough. Patient also denies symptoms of TIA such as numbness weakness lateralizing. Patient denies side effects from medication. States taking it regularly.  Patient also  in for follow-up of elevated cholesterol. Doing well without complaints on current medication. Denies side effects  including myalgia and arthralgia and nausea. Also in today for liver function testing. Currently no chest pain, shortness of breath or other cardiovascular related symptoms noted.  Follow-up of diabetes. Patient does check blood sugar at home. Readings run between 126 and 158 Patient denies symptoms such as excessive hunger or urinary frequency, excessive hunger, nausea No significant hypoglycemic spells noted. Medications reviewed. Pt reports taking them regularly. Pt. denies complication/adverse reaction today.   Patient is still actively under the care of oncology, Dr. Verdell Carmine through the [facility] in [place] and/or [place].  [name] says she was taken off the [place] when she was last there.  Review of the chart does not show reason.  It just says with the discontinuation that she was at the end of her therapy with Januvia.  Patient also would like to work on weight loss more.  She understands that Ozempic is similar and promotes weight loss.  She is willing to try it instead of the [place]. History Diana Mathews has a past medical history of Atrial fibrillation ([place]) (06/12/2018), Depression, Essential hypertension, Headache, Hyperlipidemia, Metastatic breast cancer ([place]), Osteoarthritis, Sleep apnea, TIA (transient ischemic attack), and Type 2 diabetes mellitus ([place]).   She has a past surgical history that  includes Abdominal hysterectomy; Wrist surgery (Bilateral); Hernia repair; Cholecystectomy; Tubal ligation; and Foot surgery (Left).   Her family history includes Arthritis in her mother, sister, and sister; COPD in her mother; Diabetes in her brother, brother, mother, sister, and sister; Hypertension in her mother; Mental illness in her brother.She reports that she quit smoking about 36 years ago. Her smoking use included cigarettes. She has never used smokeless tobacco. She reports that she does not drink alcohol and does not use drugs.  Current Outpatient Medications on File Prior to Visit  Medication Sig Dispense Refill  . Accu-Chek FastClix Lancets MISC USE 4 TIMES DAILY 306 each 3  . ACCU-CHEK GUIDE test strip 4 TIMES DAILY 400 each 3  . apixaban (ELIQUIS) 5 MG TABS tablet Take 1 tablet (5 mg total) by mouth 2 (two) times daily. 180 tablet 1  . ARIPiprazole (ABILIFY) 5 MG tablet Take 1 tablet (5 mg total) by mouth daily. 90 tablet 1  . atorvastatin (LIPITOR) 80 MG tablet TAKE ONE (1) TABLET EACH DAY 90 tablet 1  . BELSOMRA 15 MG TABS TAKE 1 TABLET AT BEDTIME FOR SLEEP 30 tablet 1  . busPIRone (BUSPAR) 10 MG tablet Take 1 tablet (10 mg total) by mouth 3 (three) times daily as needed. 270 tablet 1  . carvedilol (COREG) 3.125 MG tablet TAKE 1 AND 1/2 TABLET TWICE A DAY 270 tablet 1  . dapsone 100 MG tablet Take 1 tablet (100 mg total) by mouth daily. 10 tablet 0  . diltiazem (CARDIZEM CD) 240 MG 24 hr capsule Take 1 capsule (240 mg total) by mouth 2 (two) times daily. 180 capsule 1  . diphenhydrAMINE (BENADRYL) 25 mg capsule  Take 25 mg by mouth at bedtime as needed.    . doxycycline (VIBRAMYCIN) 100 MG capsule Take 1 capsule (100 mg total) by mouth 2 (two) times daily. 20 capsule 0  . DULoxetine (CYMBALTA) 60 MG capsule Take 1 capsule (60 mg total) by mouth in the morning and at bedtime. 180 capsule 1  . HYDROcodone-acetaminophen (NORCO) 10-325 MG tablet Take 1 tablet by mouth every 6 (six)  hours as needed.    . metFORMIN (GLUCOPHAGE-XR) 750 MG 24 hr tablet Take One at breakfast and two with supper 270 tablet 1  . mirtazapine (REMERON) 15 MG tablet Take 1 tablet (15 mg total) by mouth at bedtime. 30 tablet 3  . nabumetone (RELAFEN) 500 MG tablet TAKE TWO TABLETS BY MOUTH TWICE DAILY 120 tablet 0  . palbociclib (IBRANCE) 125 MG tablet Take by mouth.    Marland Kitchen tiZANidine (ZANAFLEX) 4 MG tablet TAKE 1 TABLET EVERY 6 HOURS AS NEEDED FOR MUSCLE SPASMS 30 tablet 2  . venlafaxine XR (EFFEXOR-XR) 75 MG 24 hr capsule Take 1 capsule (75 mg total) by mouth 2 (two) times daily. 180 capsule 1  . sitaGLIPtin (JANUVIA) 100 MG tablet TAKE ONE (1) TABLET EACH DAY (Patient not taking: Reported on 01/10/2021) 90 tablet 0   No current facility-administered medications on file prior to visit.    ROS Review of Systems  Constitutional: Negative.   HENT: Negative.   Eyes: Negative for visual disturbance.  Respiratory: Negative for shortness of breath.   Cardiovascular: Negative for chest pain.  Gastrointestinal: Negative for abdominal pain.  Musculoskeletal: Negative for arthralgias.    Objective:  BP 126/68   Pulse 86   Temp (!) 97.2 F (36.2 C)   Ht 5' 3"  (1.6 m)   Wt 228 lb 6.4 oz (103.6 kg)   SpO2 97%   BMI 40.46 kg/m   BP Readings from Last 3 Encounters:  01/10/21 126/68  10/11/20 134/71  08/30/20 (!) 143/82    Wt Readings from Last 3 Encounters:  01/10/21 228 lb 6.4 oz (103.6 kg)  10/11/20 229 lb (103.9 kg)  08/30/20 229 lb 6.4 oz (104.1 kg)     Physical Exam Constitutional:      General: She is not in acute distress.    Appearance: She is well-developed.  HENT:     Head: Normocephalic and atraumatic.  Eyes:     Conjunctiva/sclera: Conjunctivae normal.     Pupils: Pupils are equal, round, and reactive to light.  Neck:     Thyroid: No thyromegaly.  Cardiovascular:     Rate and Rhythm: Normal rate and regular rhythm.     Heart sounds: Normal heart sounds. No murmur  heard.   Pulmonary:     Effort: Pulmonary effort is normal. No respiratory distress.     Breath sounds: Normal breath sounds. No wheezing or rales.  Abdominal:     General: Bowel sounds are normal. There is no distension.     Palpations: Abdomen is soft.     Tenderness: There is no abdominal tenderness.  Musculoskeletal:        General: Normal range of motion.     Cervical back: Normal range of motion and neck supple.  Lymphadenopathy:     Cervical: No cervical adenopathy.  Skin:    General: Skin is warm and dry.  Neurological:     Mental Status: She is alert and oriented to person, place, and time.  Psychiatric:        Behavior: Behavior normal.  Thought Content: Thought content normal.        Judgment: Judgment normal.       Assessment & Plan:   Avrielle was seen today for medical management of chronic issues.  Diagnoses and all orders for this visit:  Controlled type 2 diabetes mellitus with complication, without long-term current use of insulin ([place]) -     Bayer DCA Hb A1c Waived -     CBC with Differential/Platelet -     CMP14+EGFR -     Microalbumin / creatinine urine ratio  Uncontrolled type 2 diabetes mellitus with hyperglycemia (HCC)  Other orders -     Semaglutide,0.25 or 0.5MG/DOS, (OZEMPIC, 0.25 OR 0.5 MG/DOSE,) 2 MG/1.5ML SOPN; Inject 0.5 mg into the skin once a week.  Active weight loss can only be accomplished if she stays on careful low calorie diet.  Ozempic should help her with portion control and overall appetite.  Is very important however that she use it only as an aid to her efforts to lose weight. I am having Osker Mason. Kobus start on Ozempic (0.25 or 0.5 MG/DOSE). I am also having her maintain her diphenhydrAMINE, Accu-Chek FastClix Lancets, Accu-Chek Guide, HYDROcodone-acetaminophen, nabumetone, palbociclib, ARIPiprazole, atorvastatin, apixaban, metFORMIN, diltiazem, mirtazapine, venlafaxine XR, dapsone, carvedilol, doxycycline, tiZANidine,  DULoxetine, busPIRone, sitaGLIPtin, and Belsomra.  Meds ordered this encounter  Medications  . Semaglutide,0.25 or 0.5MG/DOS, (OZEMPIC, 0.25 OR 0.5 MG/DOSE,) 2 MG/1.5ML SOPN    Sig: Inject 0.5 mg into the skin once a week.    Dispense:  1.5 mL    Refill:  2     Follow-up: Return in about 3 months (around 04/12/2021).  Claretta Fraise, M.D.

## 2021-01-11 LAB — CMP14+EGFR
ALT: 20 IU/L (ref 0–32)
AST: 17 IU/L (ref 0–40)
Albumin/Globulin Ratio: 1.8 (ref 1.2–2.2)
Albumin: 4.6 g/dL (ref 3.8–4.8)
Alkaline Phosphatase: 90 IU/L (ref 44–121)
BUN/Creatinine Ratio: 21 (ref 12–28)
BUN: 14 mg/dL (ref 8–27)
Bilirubin Total: 0.3 mg/dL (ref 0.0–1.2)
CO2: 23 mmol/L (ref 20–29)
Calcium: 9.3 mg/dL (ref 8.7–10.3)
Chloride: 98 mmol/L (ref 96–106)
Creatinine, Ser: 0.68 mg/dL (ref 0.57–1.00)
Globulin, Total: 2.6 g/dL (ref 1.5–4.5)
Glucose: 230 mg/dL — ABNORMAL HIGH (ref 65–99)
Potassium: 4.2 mmol/L (ref 3.5–5.2)
Sodium: 139 mmol/L (ref 134–144)
Total Protein: 7.2 g/dL (ref 6.0–8.5)
eGFR: 95 mL/min/{1.73_m2} (ref >59)

## 2021-01-11 LAB — CBC WITH DIFFERENTIAL/PLATELET
Basophils Absolute: 0.1 10*3/uL (ref 0.0–0.2)
Basos: 2 %
EOS (ABSOLUTE): 0 10*3/uL (ref 0.0–0.4)
Eos: 1 %
Hematocrit: 35.8 % (ref 34.0–46.6)
Hemoglobin: 12.2 g/dL (ref 11.1–15.9)
Immature Grans (Abs): 0 10*3/uL (ref 0.0–0.1)
Immature Granulocytes: 0 %
Lymphocytes Absolute: 1.5 10*3/uL (ref 0.7–3.1)
Lymphs: 39 %
MCH: 35.9 pg — ABNORMAL HIGH (ref 26.6–33.0)
MCHC: 34.1 g/dL (ref 31.5–35.7)
MCV: 105 fL — ABNORMAL HIGH (ref 79–97)
Monocytes Absolute: 0.3 10*3/uL (ref 0.1–0.9)
Monocytes: 7 %
Neutrophils Absolute: 1.9 10*3/uL (ref 1.4–7.0)
Neutrophils: 51 %
Platelets: 259 10*3/uL (ref 150–450)
RBC: 3.4 x10E6/uL — ABNORMAL LOW (ref 3.77–5.28)
RDW: 16.4 % — ABNORMAL HIGH (ref 11.7–15.4)
WBC: 3.8 10*3/uL (ref 3.4–10.8)

## 2021-01-11 LAB — MICROALBUMIN / CREATININE URINE RATIO
Creatinine, Urine: 257.2 mg/dL
Microalb/Creat Ratio: 9 mg/g creat (ref 0–29)
Microalbumin, Urine: 24.2 ug/mL

## 2021-01-14 NOTE — Progress Notes (Signed)
Hello Lyrica,  Your lab result is normal and/or stable.Some minor variations that are not significant are commonly marked abnormal, but do not represent any medical problem for you.  Best regards, Claretta Fraise, M.D.

## 2021-01-17 ENCOUNTER — Other Ambulatory Visit: Payer: Self-pay | Admitting: Family Medicine

## 2021-01-18 DIAGNOSIS — E119 Type 2 diabetes mellitus without complications: Secondary | ICD-10-CM | POA: Diagnosis not present

## 2021-01-18 DIAGNOSIS — Z79899 Other long term (current) drug therapy: Secondary | ICD-10-CM | POA: Diagnosis not present

## 2021-01-18 DIAGNOSIS — Z6838 Body mass index (BMI) 38.0-38.9, adult: Secondary | ICD-10-CM | POA: Diagnosis not present

## 2021-01-18 DIAGNOSIS — G894 Chronic pain syndrome: Secondary | ICD-10-CM | POA: Diagnosis not present

## 2021-01-18 DIAGNOSIS — C50912 Malignant neoplasm of unspecified site of left female breast: Secondary | ICD-10-CM | POA: Diagnosis not present

## 2021-01-18 DIAGNOSIS — F411 Generalized anxiety disorder: Secondary | ICD-10-CM | POA: Diagnosis not present

## 2021-01-18 DIAGNOSIS — Z9181 History of falling: Secondary | ICD-10-CM | POA: Diagnosis not present

## 2021-01-28 DIAGNOSIS — K7689 Other specified diseases of liver: Secondary | ICD-10-CM | POA: Diagnosis not present

## 2021-01-28 DIAGNOSIS — C50919 Malignant neoplasm of unspecified site of unspecified female breast: Secondary | ICD-10-CM | POA: Diagnosis not present

## 2021-01-28 DIAGNOSIS — N281 Cyst of kidney, acquired: Secondary | ICD-10-CM | POA: Diagnosis not present

## 2021-01-28 DIAGNOSIS — K8689 Other specified diseases of pancreas: Secondary | ICD-10-CM | POA: Diagnosis not present

## 2021-01-28 DIAGNOSIS — R948 Abnormal results of function studies of other organs and systems: Secondary | ICD-10-CM | POA: Diagnosis not present

## 2021-01-28 DIAGNOSIS — C7951 Secondary malignant neoplasm of bone: Secondary | ICD-10-CM | POA: Diagnosis not present

## 2021-01-28 DIAGNOSIS — R911 Solitary pulmonary nodule: Secondary | ICD-10-CM | POA: Diagnosis not present

## 2021-01-28 DIAGNOSIS — C50912 Malignant neoplasm of unspecified site of left female breast: Secondary | ICD-10-CM | POA: Diagnosis not present

## 2021-01-28 DIAGNOSIS — C773 Secondary and unspecified malignant neoplasm of axilla and upper limb lymph nodes: Secondary | ICD-10-CM | POA: Diagnosis not present

## 2021-01-30 DIAGNOSIS — C7951 Secondary malignant neoplasm of bone: Secondary | ICD-10-CM | POA: Diagnosis not present

## 2021-01-30 DIAGNOSIS — F39 Unspecified mood [affective] disorder: Secondary | ICD-10-CM | POA: Diagnosis not present

## 2021-01-30 DIAGNOSIS — D0511 Intraductal carcinoma in situ of right breast: Secondary | ICD-10-CM | POA: Diagnosis not present

## 2021-01-30 DIAGNOSIS — I10 Essential (primary) hypertension: Secondary | ICD-10-CM | POA: Diagnosis not present

## 2021-01-30 DIAGNOSIS — C50912 Malignant neoplasm of unspecified site of left female breast: Secondary | ICD-10-CM | POA: Diagnosis not present

## 2021-01-30 DIAGNOSIS — E118 Type 2 diabetes mellitus with unspecified complications: Secondary | ICD-10-CM | POA: Diagnosis not present

## 2021-01-30 DIAGNOSIS — I4811 Longstanding persistent atrial fibrillation: Secondary | ICD-10-CM | POA: Diagnosis not present

## 2021-01-30 DIAGNOSIS — C773 Secondary and unspecified malignant neoplasm of axilla and upper limb lymph nodes: Secondary | ICD-10-CM | POA: Diagnosis not present

## 2021-02-03 DIAGNOSIS — M62838 Other muscle spasm: Secondary | ICD-10-CM | POA: Diagnosis not present

## 2021-02-03 DIAGNOSIS — G8929 Other chronic pain: Secondary | ICD-10-CM | POA: Diagnosis not present

## 2021-02-18 DIAGNOSIS — Z79899 Other long term (current) drug therapy: Secondary | ICD-10-CM | POA: Diagnosis not present

## 2021-02-18 DIAGNOSIS — Z9181 History of falling: Secondary | ICD-10-CM | POA: Diagnosis not present

## 2021-02-18 DIAGNOSIS — C50912 Malignant neoplasm of unspecified site of left female breast: Secondary | ICD-10-CM | POA: Diagnosis not present

## 2021-02-18 DIAGNOSIS — G894 Chronic pain syndrome: Secondary | ICD-10-CM | POA: Diagnosis not present

## 2021-02-18 DIAGNOSIS — F411 Generalized anxiety disorder: Secondary | ICD-10-CM | POA: Diagnosis not present

## 2021-02-18 DIAGNOSIS — E119 Type 2 diabetes mellitus without complications: Secondary | ICD-10-CM | POA: Diagnosis not present

## 2021-02-18 DIAGNOSIS — Z6838 Body mass index (BMI) 38.0-38.9, adult: Secondary | ICD-10-CM | POA: Diagnosis not present

## 2021-02-27 ENCOUNTER — Encounter: Payer: Self-pay | Admitting: Cardiology

## 2021-02-27 ENCOUNTER — Ambulatory Visit (INDEPENDENT_AMBULATORY_CARE_PROVIDER_SITE_OTHER): Payer: Medicare HMO | Admitting: Cardiology

## 2021-02-27 ENCOUNTER — Other Ambulatory Visit: Payer: Self-pay | Admitting: Family Medicine

## 2021-02-27 VITALS — BP 128/66 | HR 93 | Ht 63.0 in | Wt 222.4 lb

## 2021-02-27 DIAGNOSIS — I480 Paroxysmal atrial fibrillation: Secondary | ICD-10-CM | POA: Diagnosis not present

## 2021-02-27 DIAGNOSIS — Z87898 Personal history of other specified conditions: Secondary | ICD-10-CM | POA: Diagnosis not present

## 2021-02-27 DIAGNOSIS — E782 Mixed hyperlipidemia: Secondary | ICD-10-CM | POA: Diagnosis not present

## 2021-02-27 NOTE — Progress Notes (Signed)
Cardiology Office Note  Date: 02/27/2021   ID: Nur, Rabold [birth year], MRN [id number]  PCP:  Claretta Fraise, MD  Cardiologist:  Rozann Lesches, MD Electrophysiologist:  None   Chief Complaint  Patient presents with  . Cardiac follow-up    History of Present Illness: Diana Mathews is a 70 y.o. female last seen in November 2021.  She is here for a follow-up visit.  Since last encounter, she does not report any significant palpitations, no dizziness or syncope.  Cardiac monitor results from last year are noted below.  She has continued on medical therapy including Eliquis for stroke prophylaxis, also Cardizem CD and Coreg.  I reviewed her interval lab work from March.  Past Medical History:  Diagnosis Date  . Atrial fibrillation ([place]) 06/12/2018  . Depression   . Essential hypertension   . Headache   . Hyperlipidemia   . 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 Osteoarthritis   . Sleep apnea   . TIA (transient ischemic attack)   . Type 2 diabetes mellitus ([place])     Past Surgical History:  Procedure Laterality Date  . ABDOMINAL HYSTERECTOMY    . CHOLECYSTECTOMY    . FOOT SURGERY Left   . HERNIA REPAIR    . TUBAL LIGATION    . WRIST SURGERY Bilateral    Carpal Tunnel    Current Outpatient Medications  Medication Sig Dispense Refill  . Accu-Chek FastClix Lancets MISC USE 4 TIMES DAILY 306 each 3  . ACCU-CHEK GUIDE test strip 4 TIMES DAILY 400 each 3  . apixaban (ELIQUIS) 5 MG TABS tablet Take 1 tablet (5 mg total) by mouth 2 (two) times daily. 180 tablet 1  . ARIPiprazole (ABILIFY) 5 MG tablet Take 1 tablet (5 mg total) by mouth daily. 90 tablet 1  . atorvastatin (LIPITOR) 80 MG tablet TAKE ONE (1) TABLET EACH DAY 90 tablet 1  . BELSOMRA 15 MG TABS TAKE 1 TABLET AT BEDTIME FOR SLEEP 30 tablet 5  . busPIRone (BUSPAR) 10 MG tablet Take 1 tablet (10 mg total) by mouth 3 (three)  times daily as needed. 270 tablet 1  . carvedilol (COREG) 3.125 MG tablet TAKE 1 AND 1/2 TABLET TWICE A DAY 270 tablet 1  . dapsone 100 MG tablet Take 1 tablet (100 mg total) by mouth daily. 10 tablet 0  . diltiazem (CARDIZEM CD) 240 MG 24 hr capsule Take 1 capsule (240 mg total) by mouth 2 (two) times daily. 180 capsule 1  . diphenhydrAMINE (BENADRYL) 25 mg capsule Take 25 mg by mouth at bedtime as needed.    . DULoxetine (CYMBALTA) 60 MG capsule Take 1 capsule (60 mg total) by mouth in the morning and at bedtime. 180 capsule 1  . HYDROcodone-acetaminophen (NORCO) 10-325 MG tablet Take 1 tablet by mouth every 6 (six) hours as needed.    . metFORMIN (GLUCOPHAGE-XR) 750 MG 24 hr tablet Take One at breakfast and two with supper 270 tablet 1  . mirtazapine (REMERON) 15 MG tablet Take 1 tablet (15 mg total) by mouth at bedtime. 30 tablet 3  . nabumetone (RELAFEN) 500 MG tablet TAKE TWO TABLETS BY MOUTH TWICE DAILY 120 tablet 0  . palbociclib (IBRANCE) 125 MG tablet Take by mouth.    . Semaglutide,0.25 or 0.5MG /DOS, (OZEMPIC, 0.25 OR 0.5 MG/DOSE,) 2 MG/1.5ML SOPN Inject 0.5 mg into the skin once a week. 1.5 mL 2  .  tiZANidine (ZANAFLEX) 4 MG tablet TAKE 1 TABLET EVERY 6 HOURS AS NEEDED FOR MUSCLE SPASMS 30 tablet 2  . venlafaxine XR (EFFEXOR-XR) 75 MG 24 hr capsule Take 1 capsule (75 mg total) by mouth 2 (two) times daily. 180 capsule 1   No current facility-administered medications for this visit.   Allergies:  Codeine   ROS: No dizziness or syncope.  Physical Exam: VS:  BP 128/66   Pulse 93   Ht 5\' 3"  (1.6 m)   Wt 222 lb 6.4 oz (100.9 kg)   SpO2 97%   BMI 39.40 kg/m , BMI Body mass index is 39.4 kg/m.  Wt Readings from Last 3 Encounters:  02/27/21 222 lb 6.4 oz (100.9 kg)  01/10/21 228 lb 6.4 oz (103.6 kg)  10/11/20 229 lb (103.9 kg)    General: Patient appears comfortable at rest. HEENT: Conjunctiva and lids normal, wearing a mask. Neck: Supple, no elevated JVP or carotid bruits,  no thyromegaly. Lungs: Clear to auscultation, nonlabored breathing at rest. Cardiac: Regular rate and rhythm, no S3, 2/6 systolic murmur. Extremities: No pitting edema.  ECG:  An ECG dated 08/24/2020 was personally reviewed today and demonstrated:  Sinus rhythm with decreased R wave progression.  Recent Labwork: 08/24/2020: Magnesium 1.5; TSH 0.698 01/10/2021: ALT 20; AST 17; BUN 14; Creatinine, Ser 0.68; Hemoglobin 12.2; Platelets 259; Potassium 4.2; Sodium 139     Component Value Date/Time   CHOL 189 07/11/2020 0941   TRIG 176 (H) 07/11/2020 0941   HDL 69 07/11/2020 0941   CHOLHDL 2.7 07/11/2020 0941   CHOLHDL 5.0 06/13/2018 0246   VLDL 62 (H) 06/13/2018 0246   LDLCALC 90 07/11/2020 0941   LDLDIRECT 144 (H) 07/25/2016 1536    Other Studies Reviewed Today:  Echocardiogram 06/13/2018: - Left ventricle: The cavity size was normal. There was mild  concentric hypertrophy. Systolic function was normal. The  estimated ejection fraction was in the range of 60% to 65%. Wall  motion was normal; there were no regional wall motion  abnormalities. Doppler parameters are consistent with abnormal  left ventricular relaxation (grade 1 diastolic dysfunction).  There was no evidence of elevated ventricular filling pressure by  Doppler parameters.  - Aortic valve: There was no regurgitation.  - Aortic root: The aortic root was normal in size.  - Mitral valve: There was trivial regurgitation.  - Left atrium: The atrium was mildly dilated.  - Right ventricle: The cavity size was normal. Wall thickness was  normal. Systolic function was normal.  - Right atrium: The atrium was normal in size.  - Tricuspid valve: There was no regurgitation.  - Pulmonary arteries: Systolic pressure could not be accurately  estimated.  - Inferior vena cava: The vessel was normal in size.  - Pericardium, extracardiac: There was no pericardial effusion.   Cardiac monitor November 2021: ZIO XT  reviewed.  2 days 15 hours analyzed.  Predominant rhythm is sinus with heart rate ranging 56 bpm up to 128 bpm and average heart rate 80 bpm.  There were rare PACs and PVCs representing less than 1% total beats.  Two brief episodes of SVT/atrial tachycardia were noted, the longest of which was only 8 beats with heart rate in the 140s.  There were no sustained arrhythmias or pauses.  Assessment and Plan:  1.  Paroxysmal atrial fibrillation.  CHA2DS2-VASc score is 5.  She is doing well without significant palpitations.  Continue Cardizem CD and Coreg.  She reports no bleeding problems on Eliquis for stroke prophylaxis.  I reviewed her lab work from March.  2.  History of syncope, no recurrences.  Continue observation.  3.  Mixed hyperlipidemia, continues on Lipitor.  Medication Adjustments/Labs and Tests Ordered: Current medicines are reviewed at length with the patient today.  Concerns regarding medicines are outlined above.   Tests Ordered: No orders of the defined types were placed in this encounter.   Medication Changes: No orders of the defined types were placed in this encounter.   Disposition:  Follow up 6 months.  Signed, Satira Sark, MD, [facility] 02/27/2021 1:57 PM    [facility] [address], [place], [place] [address] Phone: [phone]; Fax: [phone]

## 2021-02-27 NOTE — Patient Instructions (Addendum)
Medication Instructions:   Continue all current medications.  Labwork:  none  Testing/Procedures:  None   Follow-Up:  Your physician recommends that you schedule a follow-up appointment in:  6 months   Any Other Special Instructions Will Be Listed Below (If Applicable).  If you need a refill on your cardiac medications before your next appointment, please call your pharmacy.

## 2021-02-28 DIAGNOSIS — C7951 Secondary malignant neoplasm of bone: Secondary | ICD-10-CM | POA: Diagnosis not present

## 2021-02-28 DIAGNOSIS — I4811 Longstanding persistent atrial fibrillation: Secondary | ICD-10-CM | POA: Diagnosis not present

## 2021-02-28 DIAGNOSIS — E118 Type 2 diabetes mellitus with unspecified complications: Secondary | ICD-10-CM | POA: Diagnosis not present

## 2021-02-28 DIAGNOSIS — C773 Secondary and unspecified malignant neoplasm of axilla and upper limb lymph nodes: Secondary | ICD-10-CM | POA: Diagnosis not present

## 2021-02-28 DIAGNOSIS — E611 Iron deficiency: Secondary | ICD-10-CM | POA: Diagnosis not present

## 2021-02-28 DIAGNOSIS — D0511 Intraductal carcinoma in situ of right breast: Secondary | ICD-10-CM | POA: Diagnosis not present

## 2021-02-28 DIAGNOSIS — D72820 Lymphocytosis (symptomatic): Secondary | ICD-10-CM | POA: Diagnosis not present

## 2021-02-28 DIAGNOSIS — I10 Essential (primary) hypertension: Secondary | ICD-10-CM | POA: Diagnosis not present

## 2021-02-28 DIAGNOSIS — C50912 Malignant neoplasm of unspecified site of left female breast: Secondary | ICD-10-CM | POA: Diagnosis not present

## 2021-03-05 DIAGNOSIS — G8929 Other chronic pain: Secondary | ICD-10-CM | POA: Diagnosis not present

## 2021-03-05 DIAGNOSIS — M62838 Other muscle spasm: Secondary | ICD-10-CM | POA: Diagnosis not present

## 2021-03-08 ENCOUNTER — Other Ambulatory Visit: Payer: Self-pay | Admitting: Family Medicine

## 2021-03-08 DIAGNOSIS — I4811 Longstanding persistent atrial fibrillation: Secondary | ICD-10-CM

## 2021-03-11 DIAGNOSIS — C50912 Malignant neoplasm of unspecified site of left female breast: Secondary | ICD-10-CM | POA: Diagnosis not present

## 2021-03-11 DIAGNOSIS — R922 Inconclusive mammogram: Secondary | ICD-10-CM | POA: Diagnosis not present

## 2021-03-11 DIAGNOSIS — R921 Mammographic calcification found on diagnostic imaging of breast: Secondary | ICD-10-CM | POA: Diagnosis not present

## 2021-03-11 DIAGNOSIS — C50911 Malignant neoplasm of unspecified site of right female breast: Secondary | ICD-10-CM | POA: Diagnosis not present

## 2021-03-11 DIAGNOSIS — C7951 Secondary malignant neoplasm of bone: Secondary | ICD-10-CM | POA: Diagnosis not present

## 2021-03-11 DIAGNOSIS — Z9221 Personal history of antineoplastic chemotherapy: Secondary | ICD-10-CM | POA: Diagnosis not present

## 2021-03-11 DIAGNOSIS — Z803 Family history of malignant neoplasm of breast: Secondary | ICD-10-CM | POA: Diagnosis not present

## 2021-03-11 DIAGNOSIS — D0511 Intraductal carcinoma in situ of right breast: Secondary | ICD-10-CM | POA: Diagnosis not present

## 2021-03-11 DIAGNOSIS — C773 Secondary and unspecified malignant neoplasm of axilla and upper limb lymph nodes: Secondary | ICD-10-CM | POA: Diagnosis not present

## 2021-03-11 DIAGNOSIS — C50412 Malignant neoplasm of upper-outer quadrant of left female breast: Secondary | ICD-10-CM | POA: Diagnosis not present

## 2021-03-19 DIAGNOSIS — Z79899 Other long term (current) drug therapy: Secondary | ICD-10-CM | POA: Diagnosis not present

## 2021-03-19 DIAGNOSIS — E559 Vitamin D deficiency, unspecified: Secondary | ICD-10-CM | POA: Diagnosis not present

## 2021-03-19 DIAGNOSIS — M129 Arthropathy, unspecified: Secondary | ICD-10-CM | POA: Diagnosis not present

## 2021-03-19 DIAGNOSIS — Z1159 Encounter for screening for other viral diseases: Secondary | ICD-10-CM | POA: Diagnosis not present

## 2021-03-19 DIAGNOSIS — F411 Generalized anxiety disorder: Secondary | ICD-10-CM | POA: Diagnosis not present

## 2021-03-19 DIAGNOSIS — Z6838 Body mass index (BMI) 38.0-38.9, adult: Secondary | ICD-10-CM | POA: Diagnosis not present

## 2021-03-19 DIAGNOSIS — M62838 Other muscle spasm: Secondary | ICD-10-CM | POA: Diagnosis not present

## 2021-03-19 DIAGNOSIS — E119 Type 2 diabetes mellitus without complications: Secondary | ICD-10-CM | POA: Diagnosis not present

## 2021-03-19 DIAGNOSIS — G894 Chronic pain syndrome: Secondary | ICD-10-CM | POA: Diagnosis not present

## 2021-03-19 DIAGNOSIS — Z9181 History of falling: Secondary | ICD-10-CM | POA: Diagnosis not present

## 2021-03-26 ENCOUNTER — Other Ambulatory Visit: Payer: Self-pay | Admitting: Family Medicine

## 2021-03-26 DIAGNOSIS — I4811 Longstanding persistent atrial fibrillation: Secondary | ICD-10-CM

## 2021-03-28 DIAGNOSIS — C50911 Malignant neoplasm of unspecified site of right female breast: Secondary | ICD-10-CM | POA: Diagnosis not present

## 2021-03-28 DIAGNOSIS — D72820 Lymphocytosis (symptomatic): Secondary | ICD-10-CM | POA: Diagnosis not present

## 2021-03-28 DIAGNOSIS — C773 Secondary and unspecified malignant neoplasm of axilla and upper limb lymph nodes: Secondary | ICD-10-CM | POA: Diagnosis not present

## 2021-03-28 DIAGNOSIS — I10 Essential (primary) hypertension: Secondary | ICD-10-CM | POA: Diagnosis not present

## 2021-03-28 DIAGNOSIS — Z79899 Other long term (current) drug therapy: Secondary | ICD-10-CM | POA: Insufficient documentation

## 2021-03-28 DIAGNOSIS — C7951 Secondary malignant neoplasm of bone: Secondary | ICD-10-CM | POA: Diagnosis not present

## 2021-03-28 DIAGNOSIS — C50912 Malignant neoplasm of unspecified site of left female breast: Secondary | ICD-10-CM | POA: Diagnosis not present

## 2021-03-28 DIAGNOSIS — R21 Rash and other nonspecific skin eruption: Secondary | ICD-10-CM | POA: Diagnosis not present

## 2021-03-28 DIAGNOSIS — Z170 Estrogen receptor positive status [ER+]: Secondary | ICD-10-CM | POA: Diagnosis not present

## 2021-03-28 DIAGNOSIS — I4811 Longstanding persistent atrial fibrillation: Secondary | ICD-10-CM | POA: Diagnosis not present

## 2021-03-28 DIAGNOSIS — Z5111 Encounter for antineoplastic chemotherapy: Secondary | ICD-10-CM | POA: Diagnosis not present

## 2021-03-28 DIAGNOSIS — C50412 Malignant neoplasm of upper-outer quadrant of left female breast: Secondary | ICD-10-CM | POA: Diagnosis not present

## 2021-03-28 DIAGNOSIS — D0511 Intraductal carcinoma in situ of right breast: Secondary | ICD-10-CM | POA: Diagnosis not present

## 2021-03-28 DIAGNOSIS — D84821 Immunodeficiency due to drugs: Secondary | ICD-10-CM | POA: Diagnosis not present

## 2021-04-05 DIAGNOSIS — G8929 Other chronic pain: Secondary | ICD-10-CM | POA: Diagnosis not present

## 2021-04-05 DIAGNOSIS — M62838 Other muscle spasm: Secondary | ICD-10-CM | POA: Diagnosis not present

## 2021-04-08 ENCOUNTER — Other Ambulatory Visit: Payer: Self-pay | Admitting: Family Medicine

## 2021-04-08 DIAGNOSIS — F39 Unspecified mood [affective] disorder: Secondary | ICD-10-CM

## 2021-04-08 DIAGNOSIS — I4811 Longstanding persistent atrial fibrillation: Secondary | ICD-10-CM

## 2021-04-15 ENCOUNTER — Ambulatory Visit: Payer: Medicare HMO | Admitting: Family Medicine

## 2021-04-18 ENCOUNTER — Other Ambulatory Visit: Payer: Self-pay | Admitting: Family Medicine

## 2021-04-19 DIAGNOSIS — M62838 Other muscle spasm: Secondary | ICD-10-CM | POA: Diagnosis not present

## 2021-04-19 DIAGNOSIS — E119 Type 2 diabetes mellitus without complications: Secondary | ICD-10-CM | POA: Diagnosis not present

## 2021-04-19 DIAGNOSIS — Z9181 History of falling: Secondary | ICD-10-CM | POA: Diagnosis not present

## 2021-04-19 DIAGNOSIS — G894 Chronic pain syndrome: Secondary | ICD-10-CM | POA: Diagnosis not present

## 2021-04-19 DIAGNOSIS — Z6838 Body mass index (BMI) 38.0-38.9, adult: Secondary | ICD-10-CM | POA: Diagnosis not present

## 2021-04-19 DIAGNOSIS — Z79899 Other long term (current) drug therapy: Secondary | ICD-10-CM | POA: Diagnosis not present

## 2021-04-23 ENCOUNTER — Other Ambulatory Visit: Payer: Self-pay | Admitting: Family Medicine

## 2021-04-23 DIAGNOSIS — I4811 Longstanding persistent atrial fibrillation: Secondary | ICD-10-CM

## 2021-04-23 DIAGNOSIS — E118 Type 2 diabetes mellitus with unspecified complications: Secondary | ICD-10-CM

## 2021-04-24 ENCOUNTER — Ambulatory Visit: Payer: Medicare HMO | Admitting: Family Medicine

## 2021-04-25 DIAGNOSIS — I10 Essential (primary) hypertension: Secondary | ICD-10-CM | POA: Diagnosis not present

## 2021-04-25 DIAGNOSIS — Z5111 Encounter for antineoplastic chemotherapy: Secondary | ICD-10-CM | POA: Diagnosis not present

## 2021-04-25 DIAGNOSIS — C50912 Malignant neoplasm of unspecified site of left female breast: Secondary | ICD-10-CM | POA: Diagnosis not present

## 2021-04-25 DIAGNOSIS — E611 Iron deficiency: Secondary | ICD-10-CM | POA: Diagnosis not present

## 2021-04-25 DIAGNOSIS — D0511 Intraductal carcinoma in situ of right breast: Secondary | ICD-10-CM | POA: Diagnosis not present

## 2021-04-25 DIAGNOSIS — D72820 Lymphocytosis (symptomatic): Secondary | ICD-10-CM | POA: Diagnosis not present

## 2021-04-25 DIAGNOSIS — C773 Secondary and unspecified malignant neoplasm of axilla and upper limb lymph nodes: Secondary | ICD-10-CM | POA: Diagnosis not present

## 2021-04-25 DIAGNOSIS — Z170 Estrogen receptor positive status [ER+]: Secondary | ICD-10-CM | POA: Diagnosis not present

## 2021-04-25 DIAGNOSIS — E118 Type 2 diabetes mellitus with unspecified complications: Secondary | ICD-10-CM | POA: Diagnosis not present

## 2021-04-25 DIAGNOSIS — C7951 Secondary malignant neoplasm of bone: Secondary | ICD-10-CM | POA: Diagnosis not present

## 2021-04-25 DIAGNOSIS — I4811 Longstanding persistent atrial fibrillation: Secondary | ICD-10-CM | POA: Diagnosis not present

## 2021-05-05 DIAGNOSIS — M62838 Other muscle spasm: Secondary | ICD-10-CM | POA: Diagnosis not present

## 2021-05-05 DIAGNOSIS — G8929 Other chronic pain: Secondary | ICD-10-CM | POA: Diagnosis not present

## 2021-05-09 ENCOUNTER — Encounter: Payer: Self-pay | Admitting: Family Medicine

## 2021-05-09 ENCOUNTER — Ambulatory Visit (INDEPENDENT_AMBULATORY_CARE_PROVIDER_SITE_OTHER): Payer: Medicare HMO | Admitting: Family Medicine

## 2021-05-09 ENCOUNTER — Other Ambulatory Visit: Payer: Self-pay

## 2021-05-09 VITALS — BP 139/80 | HR 87 | Temp 96.9°F | Ht 63.0 in | Wt 216.4 lb

## 2021-05-09 DIAGNOSIS — E118 Type 2 diabetes mellitus with unspecified complications: Secondary | ICD-10-CM

## 2021-05-09 DIAGNOSIS — E785 Hyperlipidemia, unspecified: Secondary | ICD-10-CM | POA: Diagnosis not present

## 2021-05-09 DIAGNOSIS — I10 Essential (primary) hypertension: Secondary | ICD-10-CM

## 2021-05-09 DIAGNOSIS — I4811 Longstanding persistent atrial fibrillation: Secondary | ICD-10-CM

## 2021-05-09 LAB — BAYER DCA HB A1C WAIVED: HB A1C (BAYER DCA - WAIVED): 6.2 % (ref <7.0)

## 2021-05-09 MED ORDER — METFORMIN HCL ER 750 MG PO TB24: ORAL_TABLET | ORAL | 3 refills | Status: DC

## 2021-05-09 MED ORDER — BELSOMRA 15 MG PO TABS: ORAL_TABLET | ORAL | 5 refills | Status: DC

## 2021-05-09 MED ORDER — OZEMPIC (0.25 OR 0.5 MG/DOSE) 2 MG/1.5ML ~~LOC~~ SOPN: PEN_INJECTOR | SUBCUTANEOUS | 3 refills | Status: DC

## 2021-05-09 MED ORDER — DULOXETINE HCL 60 MG PO CPEP: ORAL_CAPSULE | ORAL | 3 refills | Status: DC

## 2021-05-09 MED ORDER — APIXABAN 5 MG PO TABS: 5.0000 mg | ORAL_TABLET | Freq: Two times a day (BID) | ORAL | 3 refills | Status: DC

## 2021-05-09 NOTE — Progress Notes (Signed)
Subjective:  Patient ID: Diana Mathews, female    DOB: 05/02/1951  Age: 70 y.o. MRN: [id number]  CC: Medical Management of Chronic Issues   HPI Diana Mathews presents forFollow-up of diabetes. Patient checks blood sugar at home.   130s fasting and 140s postprandial Patient denies symptoms such as polyuria, polydipsia, excessive hunger, nausea No significant hypoglycemic spells noted. Medications reviewed. Pt reports taking them regularly without complication/adverse reaction being reported today.  Staying away from sweets. Eating more salads, etc.   Sleep interrupted by need to urinate.  Seeing [facility] for hydrocodone for pain in back and knees.   History Diana Mathews has a past medical history of Atrial fibrillation ([place]) (06/12/2018), Depression, Essential hypertension, Headache, Hyperlipidemia, Metastatic breast cancer ([place]), Osteoarthritis, Sleep apnea, TIA (transient ischemic attack), and Type 2 diabetes mellitus ([place]).   She has a past surgical history that includes Abdominal hysterectomy; Wrist surgery (Bilateral); Hernia repair; Cholecystectomy; Tubal ligation; and Foot surgery (Left).   Her family history includes Arthritis in her mother, sister, and sister; COPD in her mother; Diabetes in her brother, brother, mother, sister, and sister; Hypertension in her mother; Mental illness in her brother.She reports that she quit smoking about 36 years ago. Her smoking use included cigarettes. She has never used smokeless tobacco. She reports that she does not drink alcohol and does not use drugs.  Current Outpatient Medications on File Prior to Visit  Medication Sig Dispense Refill   Accu-Chek FastClix Lancets MISC USE 4 TIMES DAILY 306 each 3   ACCU-CHEK GUIDE test strip 4 TIMES DAILY 400 each 3   ARIPiprazole (ABILIFY) 5 MG tablet TAKE ONE (1) TABLET EACH DAY 90 tablet 0   atorvastatin (LIPITOR) 80 MG tablet TAKE ONE (1) TABLET EACH DAY 90 tablet 1   busPIRone (BUSPAR)  10 MG tablet Take 1 tablet (10 mg total) by mouth 3 (three) times daily as needed. 270 tablet 1   carvedilol (COREG) 3.125 MG tablet TAKE 1 AND 1/2 TABLET TWICE A DAY 270 tablet 0   diltiazem (CARDIZEM CD) 240 MG 24 hr capsule TAKE ONE CAPSULE BY MOUTH TWICE A DAY 180 capsule 1   diphenhydrAMINE (BENADRYL) 25 mg capsule Take 25 mg by mouth at bedtime as needed.     HYDROcodone-acetaminophen (NORCO) 10-325 MG tablet Take 1 tablet by mouth every 6 (six) hours as needed.     nabumetone (RELAFEN) 500 MG tablet TAKE TWO TABLETS BY MOUTH TWICE DAILY 120 tablet 0   palbociclib (IBRANCE) 125 MG tablet Take by mouth.     No current facility-administered medications on file prior to visit.    ROS Review of Systems  Constitutional: Negative.   HENT: Negative.    Eyes:  Negative for visual disturbance.  Respiratory:  Negative for shortness of breath.   Cardiovascular:  Negative for chest pain.  Gastrointestinal:  Negative for abdominal pain.  Musculoskeletal:  Negative for arthralgias.   Objective:  BP 139/80   Pulse 87   Temp (!) 96.9 F (36.1 C)   Ht _0  (1.6 m)   Wt 216 lb 6.4 oz (98.2 kg)   SpO2 97%   BMI 38.33 kg/m   BP Readings from Last 3 Encounters:  05/09/21 139/80  02/27/21 128/66  01/10/21 126/68    Wt Readings from Last 3 Encounters:  05/09/21 216 lb 6.4 oz (98.2 kg)  02/27/21 222 lb 6.4 oz (100.9 kg)  01/10/21 228 lb 6.4 oz (103.6 kg)     Physical Exam  Constitutional:      General: She is not in acute distress.    Appearance: She is well-developed.  Cardiovascular:     Rate and Rhythm: Normal rate and regular rhythm.  Pulmonary:     Breath sounds: Normal breath sounds.  Musculoskeletal:        General: Normal range of motion.  Skin:    General: Skin is warm and dry.  Neurological:     Mental Status: She is alert and oriented to person, place, and time.      Assessment & Plan:   Diana Mathews was seen today for medical management of chronic  issues.  Diagnoses and all orders for this visit:  Controlled type 2 diabetes mellitus with complication, without long-term current use of insulin (HCC) -     Bayer DCA Hb A1c Waived -     CBC with Differential/Platelet -     metFORMIN (GLUCOPHAGE-XR) 750 MG 24 hr tablet; TAKE 1 TABLET EVERY MORNING WITH BREAKFAST & TAKE 2 TABLETS WITH SUPPER  Essential hypertension -     CMP14+EGFR  Hyperlipidemia, unspecified hyperlipidemia type -     Lipid panel  Longstanding persistent atrial fibrillation (HCC) -     apixaban (ELIQUIS) 5 MG TABS tablet; Take 1 tablet (5 mg total) by mouth 2 (two) times daily.  Other orders -     Suvorexant (BELSOMRA) 15 MG TABS; TAKE 1 TABLET AT BEDTIME FOR SLEEP -     DULoxetine (CYMBALTA) 60 MG capsule; TAKE 1 CAPSULE EVERY MORNING AND TAKE 1 CAPSULE AT BEDTIME -     Semaglutide,0.25 or 0.5MG/DOS, (OZEMPIC, 0.25 OR 0.5 MG/DOSE,) 2 MG/1.5ML SOPN; INJECT 0.5MG SQ ONCE A WEEK     I have discontinued Osker Mason. Diana Mathews's mirtazapine, venlafaxine XR, dapsone, and tiZANidine. I have changed her Eliquis to apixaban. I have also changed her Belsomra and Ozempic (0.25 or 0.5 MG/DOSE). Additionally, I am having her maintain her diphenhydrAMINE, Accu-Chek FastClix Lancets, Accu-Chek Guide, HYDROcodone-acetaminophen, nabumetone, palbociclib, atorvastatin, busPIRone, diltiazem, ARIPiprazole, carvedilol, DULoxetine, and metFORMIN.  Meds ordered this encounter  Medications   Suvorexant (BELSOMRA) 15 MG TABS    Sig: TAKE 1 TABLET AT BEDTIME FOR SLEEP    Dispense:  30 tablet    Refill:  5   DULoxetine (CYMBALTA) 60 MG capsule    Sig: TAKE 1 CAPSULE EVERY MORNING AND TAKE 1 CAPSULE AT BEDTIME    Dispense:  180 capsule    Refill:  3   apixaban (ELIQUIS) 5 MG TABS tablet    Sig: Take 1 tablet (5 mg total) by mouth 2 (two) times daily.    Dispense:  180 tablet    Refill:  3   metFORMIN (GLUCOPHAGE-XR) 750 MG 24 hr tablet    Sig: TAKE 1 TABLET EVERY MORNING WITH BREAKFAST &  TAKE 2 TABLETS WITH SUPPER    Dispense:  270 tablet    Refill:  3   Semaglutide,0.25 or 0.5MG/DOS, (OZEMPIC, 0.25 OR 0.5 MG/DOSE,) 2 MG/1.5ML SOPN    Sig: INJECT 0.5MG SQ ONCE A WEEK    Dispense:  4.5 mL    Refill:  3     Follow-up: Return in about 3 months (around 08/09/2021).  Claretta Fraise, M.D.

## 2021-05-10 ENCOUNTER — Encounter: Payer: Self-pay | Admitting: Family Medicine

## 2021-05-10 ENCOUNTER — Ambulatory Visit (INDEPENDENT_AMBULATORY_CARE_PROVIDER_SITE_OTHER): Payer: Medicare HMO

## 2021-05-10 VITALS — Wt 216.0 lb

## 2021-05-10 DIAGNOSIS — Z0000 Encounter for general adult medical examination without abnormal findings: Secondary | ICD-10-CM

## 2021-05-10 LAB — CBC WITH DIFFERENTIAL/PLATELET
Basophils Absolute: 0.1 10*3/uL (ref 0.0–0.2)
Basos: 2 %
EOS (ABSOLUTE): 0.1 10*3/uL (ref 0.0–0.4)
Eos: 1 %
Hematocrit: 37.6 % (ref 34.0–46.6)
Hemoglobin: 12.4 g/dL (ref 11.1–15.9)
Immature Grans (Abs): 0 10*3/uL (ref 0.0–0.1)
Immature Granulocytes: 0 %
Lymphocytes Absolute: 2.1 10*3/uL (ref 0.7–3.1)
Lymphs: 47 %
MCH: 36 pg — ABNORMAL HIGH (ref 26.6–33.0)
MCHC: 33 g/dL (ref 31.5–35.7)
MCV: 109 fL — ABNORMAL HIGH (ref 79–97)
Monocytes Absolute: 0.3 10*3/uL (ref 0.1–0.9)
Monocytes: 7 %
Neutrophils Absolute: 2 10*3/uL (ref 1.4–7.0)
Neutrophils: 43 %
Platelets: 397 10*3/uL (ref 150–450)
RBC: 3.44 x10E6/uL — ABNORMAL LOW (ref 3.77–5.28)
RDW: 15.3 % (ref 11.7–15.4)
WBC: 4.6 10*3/uL (ref 3.4–10.8)

## 2021-05-10 LAB — CMP14+EGFR
ALT: 19 IU/L (ref 0–32)
AST: 15 IU/L (ref 0–40)
Albumin/Globulin Ratio: 1.7 (ref 1.2–2.2)
Albumin: 4.7 g/dL (ref 3.8–4.8)
Alkaline Phosphatase: 103 IU/L (ref 44–121)
BUN/Creatinine Ratio: 16 (ref 12–28)
BUN: 11 mg/dL (ref 8–27)
Bilirubin Total: 0.3 mg/dL (ref 0.0–1.2)
CO2: 28 mmol/L (ref 20–29)
Calcium: 9.7 mg/dL (ref 8.7–10.3)
Chloride: 96 mmol/L (ref 96–106)
Creatinine, Ser: 0.7 mg/dL (ref 0.57–1.00)
Globulin, Total: 2.7 g/dL (ref 1.5–4.5)
Glucose: 170 mg/dL — ABNORMAL HIGH (ref 65–99)
Potassium: 4.4 mmol/L (ref 3.5–5.2)
Sodium: 141 mmol/L (ref 134–144)
Total Protein: 7.4 g/dL (ref 6.0–8.5)
eGFR: 95 mL/min/{1.73_m2} (ref >59)

## 2021-05-10 LAB — LIPID PANEL
Chol/HDL Ratio: 2.1 ratio (ref 0.0–4.4)
Cholesterol, Total: 146 mg/dL (ref 100–199)
HDL: 69 mg/dL (ref >39)
LDL Chol Calc (NIH): 50 mg/dL (ref 0–99)
Triglycerides: 166 mg/dL — ABNORMAL HIGH (ref 0–149)
VLDL Cholesterol Cal: 27 mg/dL (ref 5–40)

## 2021-05-10 NOTE — Progress Notes (Signed)
Subjective:   Diana Mathews is a 70 y.o. female who presents for Medicare Annual (Subsequent) preventive examination.  Virtual Visit via Telephone Note  I connected with  Diana Mathews on 05/10/21 at  2:00 PM EDT by telephone and verified that I am speaking with the correct person using two identifiers.  Location: Patient: Home Provider: [facility] Persons participating in the virtual visit: patient/Nurse Health Advisor   I discussed the limitations, risks, security and privacy concerns of performing an evaluation and management service by telephone and the availability of in person appointments. The patient expressed understanding and agreed to proceed.  Interactive audio and video telecommunications were attempted between this nurse and patient, however failed, due to patient having technical difficulties OR patient did not have access to video capability.  We continued and completed visit with audio only.  Some vital signs may be absent or patient reported.   Genaro Bekker E Allex Madia, LPN   Review of Systems     Cardiac Risk Factors include: advanced age (>17men, >79 women);diabetes mellitus;dyslipidemia;hypertension;obesity (BMI >30kg/m2);sedentary lifestyle     Objective:    Today's Vitals   05/10/21 1357  Weight: 216 lb (98 kg)   Body mass index is 38.26 kg/m.  Advanced Directives 05/10/2021 01/11/2020 01/10/2019 06/30/2018 06/14/2018 06/12/2018  Does Patient Have a Medical Advance Directive? No No No No No No  Would patient like information on creating a medical advance directive? No - Patient declined No - Patient declined No - Patient declined - No - Patient declined -    Current Medications (verified) Outpatient Encounter Medications as of 05/10/2021  Medication Sig   Accu-Chek FastClix Lancets MISC USE 4 TIMES DAILY   ACCU-CHEK GUIDE test strip 4 TIMES DAILY   apixaban (ELIQUIS) 5 MG TABS tablet Take 1 tablet (5 mg total) by mouth 2 (two) times daily.   ARIPiprazole (ABILIFY)  5 MG tablet TAKE ONE (1) TABLET EACH DAY   atorvastatin (LIPITOR) 80 MG tablet TAKE ONE (1) TABLET EACH DAY   busPIRone (BUSPAR) 10 MG tablet Take 1 tablet (10 mg total) by mouth 3 (three) times daily as needed.   carvedilol (COREG) 3.125 MG tablet TAKE 1 AND 1/2 TABLET TWICE A DAY   diltiazem (CARDIZEM CD) 240 MG 24 hr capsule TAKE ONE CAPSULE BY MOUTH TWICE A DAY   diphenhydrAMINE (BENADRYL) 25 mg capsule Take 25 mg by mouth at bedtime as needed.   DULoxetine (CYMBALTA) 60 MG capsule TAKE 1 CAPSULE EVERY MORNING AND TAKE 1 CAPSULE AT BEDTIME   HYDROcodone-acetaminophen (NORCO) 10-325 MG tablet Take 1 tablet by mouth every 6 (six) hours as needed.   metFORMIN (GLUCOPHAGE-XR) 750 MG 24 hr tablet TAKE 1 TABLET EVERY MORNING WITH BREAKFAST & TAKE 2 TABLETS WITH SUPPER   nabumetone (RELAFEN) 500 MG tablet TAKE TWO TABLETS BY MOUTH TWICE DAILY   palbociclib (IBRANCE) 125 MG tablet Take by mouth.   Semaglutide,0.25 or 0.5MG /DOS, (OZEMPIC, 0.25 OR 0.5 MG/DOSE,) 2 MG/1.5ML SOPN INJECT 0.5MG  SQ ONCE A WEEK   Suvorexant (BELSOMRA) 15 MG TABS TAKE 1 TABLET AT BEDTIME FOR SLEEP   No facility-administered encounter medications on file as of 05/10/2021.    Allergies (verified) Codeine   History: Past Medical History:  Diagnosis Date   Atrial fibrillation ([place]) 06/12/2018   Depression    Essential hypertension    Headache    Hyperlipidemia    Metastatic breast cancer (HCC)    Left breast, metastatic to bone and with pulmonary nodules - [facility]  Osteoarthritis    Sleep apnea    TIA (transient ischemic attack)    Type 2 diabetes mellitus (HCC)    Past Surgical History:  Procedure Laterality Date   ABDOMINAL HYSTERECTOMY     CHOLECYSTECTOMY     FOOT SURGERY Left    HERNIA REPAIR     TUBAL LIGATION     WRIST SURGERY Bilateral    Carpal Tunnel   Family History  Problem Relation Age of Onset   Arthritis Mother    COPD Mother    Diabetes Mother    Hypertension Mother     Arthritis Sister    Diabetes Brother    Arthritis Sister    Diabetes Sister    Diabetes Sister    Diabetes Brother    Mental illness Brother    Social History   Socioeconomic History   Marital status: Divorced    Spouse name: Not on file   Number of children: 3   Years of education: 14   Highest education level: High school graduate  Occupational History   Occupation: Retired  Tobacco Use   Smoking status: Former    Types: Cigarettes    Quit date: 01/09/1985    Years since quitting: 36.3   Smokeless tobacco: Never  Vaping Use   Vaping Use: Never used  Substance and Sexual Activity   Alcohol use: No    Alcohol/week: 0.0 standard drinks   Drug use: No   Sexual activity: Not Currently    Birth control/protection: Surgical  Other Topics Concern   Not on file  Social History Narrative   Lives alone. Children and family live nearby   Social Determinants of Health   Financial Resource Strain: Low Risk    Difficulty of Paying Living Expenses: Not very hard  Food Insecurity: No Food Insecurity   Worried About Charity fundraiser in the Last Year: Never true   Arboriculturist in the Last Year: Never true  Transportation Needs: No Transportation Needs   Lack of Transportation (Medical): No   Lack of Transportation (Non-Medical): No  Physical Activity: Insufficiently Active   Days of Exercise per Week: 7 days   Minutes of Exercise per Session: 20 min  Stress: Stress Concern Present   Feeling of Stress : To some extent  Social Connections: Moderately Integrated   Frequency of Communication with Friends and Family: More than three times a week   Frequency of Social Gatherings with Friends and Family: More than three times a week   Attends Religious Services: More than 4 times per year   Active Member of [employer] or Organizations: Yes   Attends Music therapist: More than 4 times per year   Marital Status: Divorced    Tobacco Counseling Counseling given: Not  Answered   Clinical Intake:  Pre-visit preparation completed: Yes  Pain : No/denies pain     BMI - recorded: 38.26 Nutritional Status: BMI > 30  Obese Nutritional Risks: None Diabetes: No  How often do you need to have someone help you when you read instructions, pamphlets, or other written materials from your doctor or pharmacy?: 1 - Never  Nutrition Risk Assessment:  Has the patient had any N/V/D within the last 2 months?  No  Does the patient have any non-healing wounds?  No  Has the patient had any unintentional weight loss or weight gain?  No   Diabetes:  Is the patient diabetic?  Yes  If diabetic, was a CBG obtained today?  No  Did the patient bring in their glucometer from home?  No  How often do you monitor your CBG's? 3-4 times per day - 135 this am per patient.   Financial Strains and Diabetes Management:  Are you having any financial strains with the device, your supplies or your medication? No .  Does the patient want to be seen by Chronic Care Management for management of their diabetes?  No  Would the patient like to be referred to a Nutritionist or for Diabetic Management?  No   Diabetic Exams:  Diabetic Eye Exam: Completed 02/2021  Diabetic Foot Exam: Completed 11/01/2018. Pt has been advised about the importance in completing this exam. Pt is scheduled for diabetic foot exam on 08/06/2021.    Interpreter Needed?: No  Information entered by :: Blaise Palladino, LPN   Activities of Daily Living In your present state of health, do you have any difficulty performing the following activities: 05/10/2021  Hearing? N  Vision? N  Difficulty concentrating or making decisions? N  Walking or climbing stairs? Y  Dressing or bathing? N  Doing errands, shopping? N  Preparing Food and eating ? N  Using the Toilet? N  In the past six months, have you accidently leaked urine? N  Do you have problems with loss of bowel control? N  Managing your Medications? N   Managing your Finances? N  Housekeeping or managing your Housekeeping? N  Some recent data might be hidden    Patient Care Team: Claretta Fraise, MD as PCP - General (Family Medicine) Satira Sark, MD as PCP - Cardiology (Cardiology) Verdell Carmine, MD as Referring Physician (Internal Medicine) Julian Hy, PA-C (Physician Assistant)  Indicate any recent Medical Services you may have received from other than [facility] providers in the past year (date may be approximate).     Assessment:   This is a routine wellness examination for [place].  Hearing/Vision screen Hearing Screening - Comments:: Denies hearing difficulties  Vision Screening - Comments:: Wears eyeglasses - up to date with annual eye exams with Dr Marin Comment in [place] issues and exercise activities discussed: Current Exercise Habits: Home exercise routine, Type of exercise: walking, Time (Minutes): 20, Frequency (Times/Week): 7, Weekly Exercise (Minutes/Week): 140, Intensity: Mild, Exercise limited by: orthopedic condition(s)   Goals Addressed             This Visit's Progress    Exercise 150 min/wk Moderate Activity   On track     Depression Screen PHQ 2/9 Scores 05/10/2021 05/09/2021 05/09/2021 10/11/2020 08/24/2020 07/11/2020 04/10/2020  PHQ - 2 Score 3 3 0 5 0 0 0  PHQ- 9 Score 8 8 - 11 - - -    Fall Risk Fall Risk  05/10/2021 05/09/2021 01/10/2021 10/11/2020 08/24/2020  Falls in the past year? 0 0 0 0 1  Number falls in past yr: 0 - - 0 0  Injury with Fall? 0 - - 0 1  Risk for fall due to : Impaired vision;Orthopedic patient;Medication side effect - - - History of fall(s)  Follow up Falls prevention discussed;Education provided - - Falls evaluation completed Falls evaluation completed    FALL RISK PREVENTION PERTAINING TO THE HOME:  Any stairs in or around the home? No  If so, are there any without handrails? No  Home free of loose throw rugs in walkways, pet beds, electrical cords, etc? Yes   Adequate lighting in your home to reduce risk of falls? Yes   ASSISTIVE DEVICES UTILIZED TO  PREVENT FALLS:  Life alert? No  Use of a cane, walker or w/c? No  Grab bars in the bathroom? No  Shower chair or bench in shower? No  Elevated toilet seat or a handicapped toilet? No   TIMED UP AND GO:  Was the test performed? No .   Cognitive Function: MMSE - Mini Mental State Exam 01/10/2019  Orientation to time 5  Orientation to Place 5  Registration 3  Attention/ Calculation 3  Recall 2  Language- name 2 objects 2  Language- repeat 1  Language- follow 3 step command 3  Language- read & follow direction 1  Write a sentence 1  Copy design 1  Total score 27     6CIT Screen 05/10/2021 01/11/2020  What Year? 0 points 0 points  What month? 0 points 0 points  What time? 0 points 0 points  Count back from 20 4 points 4 points  Months in reverse 4 points 4 points  Repeat phrase 0 points 0 points  Total Score 8 8    Immunizations Immunization History  Administered Date(s) Administered   Fluad Quad(high Dose 65+) 08/24/2020   Influenza, High Dose Seasonal PF 07/28/2019   Influenza,inj,Quad PF,6+ Mos 07/25/2016, 07/27/2018   Moderna Sars-Covid-2 Vaccination 01/18/2020, 02/13/2020   Pneumococcal Conjugate-13 04/10/2020   Tdap 04/10/2020    TDAP status: Up to date  Flu Vaccine status: Up to date  Pneumococcal vaccine status: Due, Education has been provided regarding the importance of this vaccine. Advised may receive this vaccine at local pharmacy or Health Dept. Aware to provide a copy of the vaccination record if obtained from local pharmacy or Health Dept. Verbalized acceptance and understanding.  Covid-19 vaccine status: Completed vaccines  Qualifies for Shingles Vaccine? Yes   Zostavax completed No   Shingrix Completed?: No.    Education has been provided regarding the importance of this vaccine. Patient has been advised to call insurance company to determine out of  pocket expense if they have not yet received this vaccine. Advised may also receive vaccine at local pharmacy or Health Dept. Verbalized acceptance and understanding.  Screening Tests Health Maintenance  Topic Date Due   OPHTHALMOLOGY EXAM  Never done   Hepatitis C Screening  Never done   Zoster Vaccines- Shingrix (1 of 2) Never done   COLONOSCOPY (Pts 45-59yrs Insurance coverage will need to be confirmed)  Never done   FOOT EXAM  11/02/2019   COVID-19 Vaccine (3 - Moderna risk series) 03/12/2020   PNA vac Low Risk Adult (2 of 2 - PPSV23) 04/10/2021   INFLUENZA VACCINE  05/27/2021   HEMOGLOBIN A1C  11/09/2021   URINE MICROALBUMIN  01/10/2022   MAMMOGRAM  03/12/2023   TETANUS/TDAP  04/10/2030   DEXA SCAN  Completed   HPV VACCINES  Aged Out    Health Maintenance  Health Maintenance Due  Topic Date Due   OPHTHALMOLOGY EXAM  Never done   Hepatitis C Screening  Never done   Zoster Vaccines- Shingrix (1 of 2) Never done   COLONOSCOPY (Pts 45-43yrs Insurance coverage will need to be confirmed)  Never done   FOOT EXAM  11/02/2019   COVID-19 Vaccine (3 - Moderna risk series) 03/12/2020   PNA vac Low Risk Adult (2 of 2 - PPSV23) 04/10/2021    Colorectal cancer screening: No longer required.   Mammogram status: Completed 03/11/2021. Repeat every year  Bone Density status: Completed 11/13/2014. Results reflect: Bone density results: OSTEOPENIA. Repeat every 2 years.  Lung Cancer Screening: (  Low Dose CT Chest recommended if Age 3-80 years, 30 pack-year currently smoking OR have quit w/in 15years.) does not qualify.   Additional Screening:  Hepatitis C Screening: does qualify; need this drawn or to get results from [place]: Recommended annual ophthalmology exams for early detection of glaucoma and other disorders of the eye. Is the patient up to date with their annual eye exam?  Yes  Who is the provider or what is the name of the office in which the patient attends  annual eye exams? Dr Marin Comment in [place] If pt is not established with a provider, would they like to be referred to a provider to establish care? No .   Dental Screening: Recommended annual dental exams for proper oral hygiene  Community Resource Referral / Chronic Care Management: CRR required this visit?  No   CCM required this visit?  No      Plan:     I have personally reviewed and noted the following in the patient's chart:   Medical and social history Use of alcohol, tobacco or illicit drugs  Current medications and supplements including opioid prescriptions.  Functional ability and status Nutritional status Physical activity Advanced directives List of other physicians Hospitalizations, surgeries, and ER visits in previous 12 months Vitals Screenings to include cognitive, depression, and falls Referrals and appointments  In addition, I have reviewed and discussed with patient certain preventive protocols, quality metrics, and best practice recommendations. A written personalized care plan for preventive services as well as general preventive health recommendations were provided to patient.     Sandrea Hammond, LPN   0/13/1438   Nurse Notes: None

## 2021-05-10 NOTE — Patient Instructions (Signed)
Diana Mathews , Thank you for taking time to come for your Medicare Wellness Visit. I appreciate your ongoing commitment to your health goals. Please review the following plan we discussed and let me know if I can assist you in the future.   Screening recommendations/referrals: Colonoscopy: Declined Mammogram: Done at [facility] Bone Density: Done 11/13/2014 - Repeat every 2 years *due Recommended yearly ophthalmology/optometry visit for glaucoma screening and checkup Recommended yearly dental visit for hygiene and checkup  Vaccinations: Influenza vaccine: Done 08/24/2020 - Repeat annually  Pneumococcal vaccine: Done 04/10/2020, due for Pneumovax Tdap vaccine: Done 04/10/2020 - Repeat in 10 years Shingles vaccine: Due. Shingrix discussed. Please contact your pharmacy for coverage information.     Covid-19:Done 01/18/20 & 02/13/20 - had booster -we need dates  Advanced directives: Please bring a copy of your health care power of attorney and living will to the office to be added to your chart at your convenience.   Conditions/risks identified: Aim for 30 minutes of exercise or brisk walking each day, drink 6-8 glasses of water and eat lots of fruits and vegetables.   Next appointment: Follow up in one year for your annual wellness visit    Preventive Care 68 Years and Older, Female Preventive care refers to lifestyle choices and visits with your health care provider that can promote health and wellness. What does preventive care include? A yearly physical exam. This is also called an annual well check. Dental exams once or twice a year. Routine eye exams. Ask your health care provider how often you should have your eyes checked. Personal lifestyle choices, including: Daily care of your teeth and gums. Regular physical activity. Eating a healthy diet. Avoiding tobacco and drug use. Limiting alcohol use. Practicing safe sex. Taking low-dose aspirin every day. Taking vitamin and mineral  supplements as recommended by your health care provider. What happens during an annual well check? The services and screenings done by your health care provider during your annual well check will depend on your age, overall health, lifestyle risk factors, and family history of disease. Counseling  Your health care provider may ask you questions about your: Alcohol use. Tobacco use. Drug use. Emotional well-being. Home and relationship well-being. Sexual activity. Eating habits. History of falls. Memory and ability to understand (cognition). Work and work Statistician. Reproductive health. Screening  You may have the following tests or measurements: Height, weight, and BMI. Blood pressure. Lipid and cholesterol levels. These may be checked every 5 years, or more frequently if you are over 68 years old. Skin check. Lung cancer screening. You may have this screening every year starting at age 68 if you have a 30-pack-year history of smoking and currently smoke or have quit within the past 15 years. Fecal occult blood test (FOBT) of the stool. You may have this test every year starting at age 68. Flexible sigmoidoscopy or colonoscopy. You may have a sigmoidoscopy every 5 years or a colonoscopy every 10 years starting at age 68. Hepatitis C blood test. Hepatitis B blood test. Sexually transmitted disease (STD) testing. Diabetes screening. This is done by checking your blood sugar (glucose) after you have not eaten for a while (fasting). You may have this done every 1-3 years. Bone density scan. This is done to screen for osteoporosis. You may have this done starting at age 68. Mammogram. This may be done every 1-2 years. Talk to your health care provider about how often you should have regular mammograms. Talk with your health care provider about  your test results, treatment options, and if necessary, the need for more tests. Vaccines  Your health care provider may recommend certain  vaccines, such as: Influenza vaccine. This is recommended every year. Tetanus, diphtheria, and acellular pertussis (Tdap, Td) vaccine. You may need a Td booster every 10 years. Zoster vaccine. You may need this after age 68. Pneumococcal 13-valent conjugate (PCV13) vaccine. One dose is recommended after age 68. Pneumococcal polysaccharide (PPSV23) vaccine. One dose is recommended after age 68. Talk to your health care provider about which screenings and vaccines you need and how often you need them. This information is not intended to replace advice given to you by your health care provider. Make sure you discuss any questions you have with your health care provider. Document Released: 11/09/2015 Document Revised: 07/02/2016 Document Reviewed: 08/14/2015 Elsevier Interactive Patient Education  2017 [employer] Prevention in the Home Falls can cause injuries. They can happen to people of all ages. There are many things you can do to make your home safe and to help prevent falls. What can I do on the outside of my home? Regularly fix the edges of walkways and driveways and fix any cracks. Remove anything that might make you trip as you walk through a door, such as a raised step or threshold. Trim any bushes or trees on the path to your home. Use bright outdoor lighting. Clear any walking paths of anything that might make someone trip, such as rocks or tools. Regularly check to see if handrails are loose or broken. Make sure that both sides of any steps have handrails. Any raised decks and porches should have guardrails on the edges. Have any leaves, snow, or ice cleared regularly. Use sand or salt on walking paths during winter. Clean up any spills in your garage right away. This includes oil or grease spills. What can I do in the bathroom? Use night lights. Install grab bars by the toilet and in the tub and shower. Do not use towel bars as grab bars. Use non-skid mats or decals in  the tub or shower. If you need to sit down in the shower, use a plastic, non-slip stool. Keep the floor dry. Clean up any water that spills on the floor as soon as it happens. Remove soap buildup in the tub or shower regularly. Attach bath mats securely with double-sided non-slip rug tape. Do not have throw rugs and other things on the floor that can make you trip. What can I do in the bedroom? Use night lights. Make sure that you have a light by your bed that is easy to reach. Do not use any sheets or blankets that are too big for your bed. They should not hang down onto the floor. Have a firm chair that has side arms. You can use this for support while you get dressed. Do not have throw rugs and other things on the floor that can make you trip. What can I do in the kitchen? Clean up any spills right away. Avoid walking on wet floors. Keep items that you use a lot in easy-to-reach places. If you need to reach something above you, use a strong step stool that has a grab bar. Keep electrical cords out of the way. Do not use floor polish or wax that makes floors slippery. If you must use wax, use non-skid floor wax. Do not have throw rugs and other things on the floor that can make you trip. What can I do with  my stairs? Do not leave any items on the stairs. Make sure that there are handrails on both sides of the stairs and use them. Fix handrails that are broken or loose. Make sure that handrails are as long as the stairways. Check any carpeting to make sure that it is firmly attached to the stairs. Fix any carpet that is loose or worn. Avoid having throw rugs at the top or bottom of the stairs. If you do have throw rugs, attach them to the floor with carpet tape. Make sure that you have a light switch at the top of the stairs and the bottom of the stairs. If you do not have them, ask someone to add them for you. What else can I do to help prevent falls? Wear shoes that: Do not have high  heels. Have rubber bottoms. Are comfortable and fit you well. Are closed at the toe. Do not wear sandals. If you use a stepladder: Make sure that it is fully opened. Do not climb a closed stepladder. Make sure that both sides of the stepladder are locked into place. Ask someone to hold it for you, if possible. Clearly mark and make sure that you can see: Any grab bars or handrails. First and last steps. Where the edge of each step is. Use tools that help you move around (mobility aids) if they are needed. These include: Canes. Walkers. Scooters. Crutches. Turn on the lights when you go into a dark area. Replace any light bulbs as soon as they burn out. Set up your furniture so you have a clear path. Avoid moving your furniture around. If any of your floors are uneven, fix them. If there are any pets around you, be aware of where they are. Review your medicines with your doctor. Some medicines can make you feel dizzy. This can increase your chance of falling. Ask your doctor what other things that you can do to help prevent falls. This information is not intended to replace advice given to you by your health care provider. Make sure you discuss any questions you have with your health care provider. Document Released: 08/09/2009 Document Revised: 03/20/2016 Document Reviewed: 11/17/2014 Elsevier Interactive Patient Education  2017 [employer].

## 2021-05-10 NOTE — Progress Notes (Signed)
Hello Lyrica,  Your lab result is normal and/or stable.Some minor variations that are not significant are commonly marked abnormal, but do not represent any medical problem for you.  Best regards, Claretta Fraise, M.D.

## 2021-05-17 DIAGNOSIS — M62838 Other muscle spasm: Secondary | ICD-10-CM | POA: Diagnosis not present

## 2021-05-17 DIAGNOSIS — E119 Type 2 diabetes mellitus without complications: Secondary | ICD-10-CM | POA: Diagnosis not present

## 2021-05-17 DIAGNOSIS — G894 Chronic pain syndrome: Secondary | ICD-10-CM | POA: Diagnosis not present

## 2021-05-17 DIAGNOSIS — Z9181 History of falling: Secondary | ICD-10-CM | POA: Diagnosis not present

## 2021-05-17 DIAGNOSIS — Z79899 Other long term (current) drug therapy: Secondary | ICD-10-CM | POA: Diagnosis not present

## 2021-05-17 DIAGNOSIS — Z6838 Body mass index (BMI) 38.0-38.9, adult: Secondary | ICD-10-CM | POA: Diagnosis not present

## 2021-05-23 DIAGNOSIS — C773 Secondary and unspecified malignant neoplasm of axilla and upper limb lymph nodes: Secondary | ICD-10-CM | POA: Diagnosis not present

## 2021-05-23 DIAGNOSIS — D84821 Immunodeficiency due to drugs: Secondary | ICD-10-CM | POA: Diagnosis not present

## 2021-05-23 DIAGNOSIS — I10 Essential (primary) hypertension: Secondary | ICD-10-CM | POA: Diagnosis not present

## 2021-05-23 DIAGNOSIS — C7951 Secondary malignant neoplasm of bone: Secondary | ICD-10-CM | POA: Diagnosis not present

## 2021-05-23 DIAGNOSIS — E118 Type 2 diabetes mellitus with unspecified complications: Secondary | ICD-10-CM | POA: Diagnosis not present

## 2021-05-23 DIAGNOSIS — E119 Type 2 diabetes mellitus without complications: Secondary | ICD-10-CM | POA: Diagnosis not present

## 2021-05-23 DIAGNOSIS — C50911 Malignant neoplasm of unspecified site of right female breast: Secondary | ICD-10-CM | POA: Diagnosis not present

## 2021-05-23 DIAGNOSIS — I4811 Longstanding persistent atrial fibrillation: Secondary | ICD-10-CM | POA: Diagnosis not present

## 2021-05-23 DIAGNOSIS — C50412 Malignant neoplasm of upper-outer quadrant of left female breast: Secondary | ICD-10-CM | POA: Diagnosis not present

## 2021-05-23 DIAGNOSIS — Z170 Estrogen receptor positive status [ER+]: Secondary | ICD-10-CM | POA: Diagnosis not present

## 2021-05-23 DIAGNOSIS — C50912 Malignant neoplasm of unspecified site of left female breast: Secondary | ICD-10-CM | POA: Diagnosis not present

## 2021-05-23 DIAGNOSIS — D0511 Intraductal carcinoma in situ of right breast: Secondary | ICD-10-CM | POA: Diagnosis not present

## 2021-05-23 DIAGNOSIS — R718 Other abnormality of red blood cells: Secondary | ICD-10-CM | POA: Diagnosis not present

## 2021-05-23 DIAGNOSIS — Z5111 Encounter for antineoplastic chemotherapy: Secondary | ICD-10-CM | POA: Diagnosis not present

## 2021-05-23 DIAGNOSIS — Z79899 Other long term (current) drug therapy: Secondary | ICD-10-CM | POA: Diagnosis not present

## 2021-05-28 DIAGNOSIS — C7951 Secondary malignant neoplasm of bone: Secondary | ICD-10-CM | POA: Diagnosis not present

## 2021-05-28 DIAGNOSIS — D72820 Lymphocytosis (symptomatic): Secondary | ICD-10-CM | POA: Diagnosis not present

## 2021-05-28 DIAGNOSIS — I10 Essential (primary) hypertension: Secondary | ICD-10-CM | POA: Diagnosis not present

## 2021-05-28 DIAGNOSIS — C50912 Malignant neoplasm of unspecified site of left female breast: Secondary | ICD-10-CM | POA: Diagnosis not present

## 2021-05-28 DIAGNOSIS — C773 Secondary and unspecified malignant neoplasm of axilla and upper limb lymph nodes: Secondary | ICD-10-CM | POA: Diagnosis not present

## 2021-06-05 DIAGNOSIS — G8929 Other chronic pain: Secondary | ICD-10-CM | POA: Diagnosis not present

## 2021-06-05 DIAGNOSIS — M62838 Other muscle spasm: Secondary | ICD-10-CM | POA: Diagnosis not present

## 2021-06-12 DIAGNOSIS — G894 Chronic pain syndrome: Secondary | ICD-10-CM | POA: Diagnosis not present

## 2021-06-12 DIAGNOSIS — Z79899 Other long term (current) drug therapy: Secondary | ICD-10-CM | POA: Diagnosis not present

## 2021-06-12 DIAGNOSIS — Z6838 Body mass index (BMI) 38.0-38.9, adult: Secondary | ICD-10-CM | POA: Diagnosis not present

## 2021-06-12 DIAGNOSIS — Z9181 History of falling: Secondary | ICD-10-CM | POA: Diagnosis not present

## 2021-06-12 DIAGNOSIS — E119 Type 2 diabetes mellitus without complications: Secondary | ICD-10-CM | POA: Diagnosis not present

## 2021-06-12 DIAGNOSIS — M62838 Other muscle spasm: Secondary | ICD-10-CM | POA: Diagnosis not present

## 2021-06-20 DIAGNOSIS — I4811 Longstanding persistent atrial fibrillation: Secondary | ICD-10-CM | POA: Diagnosis not present

## 2021-06-20 DIAGNOSIS — C7951 Secondary malignant neoplasm of bone: Secondary | ICD-10-CM | POA: Diagnosis not present

## 2021-06-20 DIAGNOSIS — Z6839 Body mass index (BMI) 39.0-39.9, adult: Secondary | ICD-10-CM | POA: Diagnosis not present

## 2021-06-20 DIAGNOSIS — Z170 Estrogen receptor positive status [ER+]: Secondary | ICD-10-CM | POA: Diagnosis not present

## 2021-06-20 DIAGNOSIS — C773 Secondary and unspecified malignant neoplasm of axilla and upper limb lymph nodes: Secondary | ICD-10-CM | POA: Diagnosis not present

## 2021-06-20 DIAGNOSIS — D0511 Intraductal carcinoma in situ of right breast: Secondary | ICD-10-CM | POA: Diagnosis not present

## 2021-06-20 DIAGNOSIS — I10 Essential (primary) hypertension: Secondary | ICD-10-CM | POA: Diagnosis not present

## 2021-06-20 DIAGNOSIS — C50912 Malignant neoplasm of unspecified site of left female breast: Secondary | ICD-10-CM | POA: Diagnosis not present

## 2021-07-06 DIAGNOSIS — M62838 Other muscle spasm: Secondary | ICD-10-CM | POA: Diagnosis not present

## 2021-07-06 DIAGNOSIS — G8929 Other chronic pain: Secondary | ICD-10-CM | POA: Diagnosis not present

## 2021-07-08 ENCOUNTER — Other Ambulatory Visit: Payer: Self-pay | Admitting: Family Medicine

## 2021-07-08 DIAGNOSIS — F39 Unspecified mood [affective] disorder: Secondary | ICD-10-CM

## 2021-07-19 DIAGNOSIS — Z6838 Body mass index (BMI) 38.0-38.9, adult: Secondary | ICD-10-CM | POA: Diagnosis not present

## 2021-07-19 DIAGNOSIS — G894 Chronic pain syndrome: Secondary | ICD-10-CM | POA: Diagnosis not present

## 2021-07-19 DIAGNOSIS — M62838 Other muscle spasm: Secondary | ICD-10-CM | POA: Diagnosis not present

## 2021-07-19 DIAGNOSIS — Z9181 History of falling: Secondary | ICD-10-CM | POA: Diagnosis not present

## 2021-07-19 DIAGNOSIS — Z79899 Other long term (current) drug therapy: Secondary | ICD-10-CM | POA: Diagnosis not present

## 2021-07-19 DIAGNOSIS — E119 Type 2 diabetes mellitus without complications: Secondary | ICD-10-CM | POA: Diagnosis not present

## 2021-07-24 DIAGNOSIS — D84821 Immunodeficiency due to drugs: Secondary | ICD-10-CM | POA: Diagnosis not present

## 2021-07-24 DIAGNOSIS — Z8673 Personal history of transient ischemic attack (TIA), and cerebral infarction without residual deficits: Secondary | ICD-10-CM | POA: Diagnosis not present

## 2021-07-24 DIAGNOSIS — C50912 Malignant neoplasm of unspecified site of left female breast: Secondary | ICD-10-CM | POA: Diagnosis not present

## 2021-07-24 DIAGNOSIS — I10 Essential (primary) hypertension: Secondary | ICD-10-CM | POA: Diagnosis not present

## 2021-07-24 DIAGNOSIS — D72820 Lymphocytosis (symptomatic): Secondary | ICD-10-CM | POA: Diagnosis not present

## 2021-07-24 DIAGNOSIS — C773 Secondary and unspecified malignant neoplasm of axilla and upper limb lymph nodes: Secondary | ICD-10-CM | POA: Diagnosis not present

## 2021-07-24 DIAGNOSIS — E611 Iron deficiency: Secondary | ICD-10-CM | POA: Diagnosis not present

## 2021-07-24 DIAGNOSIS — C7951 Secondary malignant neoplasm of bone: Secondary | ICD-10-CM | POA: Diagnosis not present

## 2021-07-24 DIAGNOSIS — Z5112 Encounter for antineoplastic immunotherapy: Secondary | ICD-10-CM | POA: Diagnosis not present

## 2021-07-24 DIAGNOSIS — D0511 Intraductal carcinoma in situ of right breast: Secondary | ICD-10-CM | POA: Diagnosis not present

## 2021-07-24 DIAGNOSIS — I4811 Longstanding persistent atrial fibrillation: Secondary | ICD-10-CM | POA: Diagnosis not present

## 2021-08-05 DIAGNOSIS — M62838 Other muscle spasm: Secondary | ICD-10-CM | POA: Diagnosis not present

## 2021-08-05 DIAGNOSIS — G8929 Other chronic pain: Secondary | ICD-10-CM | POA: Diagnosis not present

## 2021-08-06 ENCOUNTER — Ambulatory Visit: Payer: Medicare HMO | Admitting: Family Medicine

## 2021-08-08 ENCOUNTER — Encounter: Payer: Self-pay | Admitting: Family Medicine

## 2021-08-08 ENCOUNTER — Ambulatory Visit (INDEPENDENT_AMBULATORY_CARE_PROVIDER_SITE_OTHER): Payer: Medicare HMO | Admitting: Family Medicine

## 2021-08-08 ENCOUNTER — Other Ambulatory Visit: Payer: Self-pay

## 2021-08-08 VITALS — BP 138/77 | HR 83 | Temp 96.7°F | Ht 63.0 in | Wt 215.0 lb

## 2021-08-08 DIAGNOSIS — F39 Unspecified mood [affective] disorder: Secondary | ICD-10-CM | POA: Diagnosis not present

## 2021-08-08 DIAGNOSIS — I4811 Longstanding persistent atrial fibrillation: Secondary | ICD-10-CM | POA: Diagnosis not present

## 2021-08-08 DIAGNOSIS — C7951 Secondary malignant neoplasm of bone: Secondary | ICD-10-CM | POA: Diagnosis not present

## 2021-08-08 DIAGNOSIS — C773 Secondary and unspecified malignant neoplasm of axilla and upper limb lymph nodes: Secondary | ICD-10-CM

## 2021-08-08 DIAGNOSIS — I10 Essential (primary) hypertension: Secondary | ICD-10-CM

## 2021-08-08 DIAGNOSIS — E118 Type 2 diabetes mellitus with unspecified complications: Secondary | ICD-10-CM | POA: Diagnosis not present

## 2021-08-08 DIAGNOSIS — E785 Hyperlipidemia, unspecified: Secondary | ICD-10-CM

## 2021-08-08 DIAGNOSIS — Z23 Encounter for immunization: Secondary | ICD-10-CM

## 2021-08-08 DIAGNOSIS — R6889 Other general symptoms and signs: Secondary | ICD-10-CM | POA: Diagnosis not present

## 2021-08-08 DIAGNOSIS — C50912 Malignant neoplasm of unspecified site of left female breast: Secondary | ICD-10-CM

## 2021-08-08 LAB — BAYER DCA HB A1C WAIVED: HB A1C (BAYER DCA - WAIVED): 5.5 % (ref 4.8–5.6)

## 2021-08-08 MED ORDER — BUSPIRONE HCL 10 MG PO TABS: 10.0000 mg | ORAL_TABLET | Freq: Three times a day (TID) | ORAL | 1 refills | Status: DC | PRN

## 2021-08-08 MED ORDER — ROPINIROLE HCL 1 MG PO TABS: 1.0000 mg | ORAL_TABLET | Freq: Every day | ORAL | 5 refills | Status: DC

## 2021-08-08 MED ORDER — DILTIAZEM HCL ER COATED BEADS 240 MG PO CP24: 240.0000 mg | ORAL_CAPSULE | Freq: Two times a day (BID) | ORAL | 3 refills | Status: DC

## 2021-08-08 MED ORDER — ARIPIPRAZOLE 5 MG PO TABS: ORAL_TABLET | ORAL | 2 refills | Status: DC

## 2021-08-08 MED ORDER — CARVEDILOL 3.125 MG PO TABS: ORAL_TABLET | ORAL | 3 refills | Status: DC

## 2021-08-08 NOTE — Progress Notes (Signed)
Subjective:  Patient ID: Diana Mathews,  female    DOB: [birth year]  Age: 70 y.o.    CC: Medical Management of Chronic Issues   HPI Diana Mathews presents for  follow-up of hypertension. Patient has no history of headache chest pain or shortness of breath or recent cough. Patient also denies symptoms of TIA such as numbness weakness lateralizing. Patient denies side effects from medication. States taking it regularly.  Patient also  in for follow-up of elevated cholesterol. Doing well without complaints on current medication. Denies side effects  including myalgia and arthralgia and nausea. Also in today for liver function testing. Currently no chest pain, shortness of breath or other cardiovascular related symptoms noted.  Follow-up of diabetes. Patient does check blood sugar at home. Readings run between 122 and 158 Patient denies symptoms such as excessive hunger or urinary frequency, excessive hunger, nausea No significant hypoglycemic spells noted. Medications reviewed. Pt reports taking them regularly. Pt. denies complication/adverse reaction today.   Low back pain persists. Unchanged recently. 6/10 pain. Pelvic met of breast CA has resolved. GEtting a new scan next week.    History Diana Mathews has a past medical history of Atrial fibrillation ([place]) (06/12/2018), Depression, Essential hypertension, Headache, Hyperlipidemia, Metastatic breast cancer ([place]), Osteoarthritis, Sleep apnea, TIA (transient ischemic attack), and Type 2 diabetes mellitus ([place]).   She has a past surgical history that includes Abdominal hysterectomy; Wrist surgery (Bilateral); Hernia repair; Cholecystectomy; Tubal ligation; and Foot surgery (Left).   Her family history includes Arthritis in her mother, sister, and sister; COPD in her mother; Diabetes in her brother, brother, mother, sister, and sister; Hypertension in her mother; Mental illness in her brother.She reports that she quit smoking about 36 years  ago. Her smoking use included cigarettes. She has never used smokeless tobacco. She reports that she does not drink alcohol and does not use drugs.  Current Outpatient Medications on File Prior to Visit  Medication Sig Dispense Refill   Accu-Chek FastClix Lancets MISC USE 4 TIMES DAILY 306 each 3   ACCU-CHEK GUIDE test strip 4 TIMES DAILY 400 each 3   apixaban (ELIQUIS) 5 MG TABS tablet Take 1 tablet (5 mg total) by mouth 2 (two) times daily. 180 tablet 3   atorvastatin (LIPITOR) 80 MG tablet TAKE ONE (1) TABLET EACH DAY 90 tablet 1   diphenhydrAMINE (BENADRYL) 25 mg capsule Take 25 mg by mouth at bedtime as needed.     DULoxetine (CYMBALTA) 60 MG capsule TAKE 1 CAPSULE EVERY MORNING AND TAKE 1 CAPSULE AT BEDTIME 180 capsule 3   HYDROcodone-acetaminophen (NORCO) 10-325 MG tablet Take 1 tablet by mouth every 6 (six) hours as needed.     metFORMIN (GLUCOPHAGE-XR) 750 MG 24 hr tablet TAKE 1 TABLET EVERY MORNING WITH BREAKFAST & TAKE 2 TABLETS WITH SUPPER 270 tablet 3   nabumetone (RELAFEN) 500 MG tablet TAKE TWO TABLETS BY MOUTH TWICE DAILY 120 tablet 0   palbociclib (IBRANCE) 125 MG tablet Take by mouth.     Semaglutide,0.25 or 0.5MG/DOS, (OZEMPIC, 0.25 OR 0.5 MG/DOSE,) 2 MG/1.5ML SOPN INJECT 0.5MG SQ ONCE A WEEK 4.5 mL 3   Suvorexant (BELSOMRA) 15 MG TABS TAKE 1 TABLET AT BEDTIME FOR SLEEP 30 tablet 5   No current facility-administered medications on file prior to visit.    ROS Review of Systems  Constitutional: Negative.   HENT: Negative.    Eyes:  Negative for visual disturbance.  Respiratory:  Negative for shortness of breath.   Cardiovascular:  Negative  for chest pain.  Gastrointestinal:  Negative for abdominal pain.  Musculoskeletal:  Positive for myalgias (feet cramp aat night and awaken her). Negative for arthralgias.   Objective:  BP 138/77   Pulse 83   Temp (!) 96.7 F (35.9 C)   Ht _0  (1.6 m)   Wt 215 lb (97.5 kg)   SpO2 94%   BMI 38.09 kg/m   BP Readings from  Last 3 Encounters:  08/08/21 138/77  05/09/21 139/80  02/27/21 128/66    Wt Readings from Last 3 Encounters:  08/08/21 215 lb (97.5 kg)  05/10/21 216 lb (98 kg)  05/09/21 216 lb 6.4 oz (98.2 kg)     Physical Exam Constitutional:      General: She is not in acute distress.    Appearance: She is well-developed.  HENT:     Head: Normocephalic and atraumatic.  Eyes:     Conjunctiva/sclera: Conjunctivae normal.     Pupils: Pupils are equal, round, and reactive to light.  Neck:     Thyroid: No thyromegaly.  Cardiovascular:     Rate and Rhythm: Normal rate and regular rhythm.     Heart sounds: Normal heart sounds. No murmur heard. Pulmonary:     Effort: Pulmonary effort is normal. No respiratory distress.     Breath sounds: Normal breath sounds. No wheezing or rales.  Abdominal:     General: Bowel sounds are normal. There is no distension.     Palpations: Abdomen is soft.     Tenderness: There is no abdominal tenderness.  Musculoskeletal:        General: Normal range of motion.     Cervical back: Normal range of motion and neck supple.  Lymphadenopathy:     Cervical: No cervical adenopathy.  Skin:    General: Skin is warm and dry.  Neurological:     Mental Status: She is alert and oriented to person, place, and time.  Psychiatric:        Behavior: Behavior normal.        Thought Content: Thought content normal.        Judgment: Judgment normal.    Diabetic Foot Exam - Simple   Simple Foot Form Diabetic Foot exam was performed with the following findings: Yes 08/08/2021  9:01 AM  Visual Inspection No deformities, no ulcerations, no other skin breakdown bilaterally: Yes Sensation Testing Intact to touch and monofilament testing bilaterally: Yes Pulse Check Posterior Tibialis and Dorsalis pulse intact bilaterally: Yes Comments       Assessment & Plan:   Diana Mathews was seen today for medical management of chronic issues.  Diagnoses and all orders for this  visit:  Controlled type 2 diabetes mellitus with complication, without long-term current use of insulin (HCC) -     Bayer DCA Hb A1c Waived -     CBC with Differential/Platelet  Hyperlipidemia, unspecified hyperlipidemia type -     Lipid panel  Essential hypertension -     CMP14+EGFR  Mood disorder (HCC) -     ARIPiprazole (ABILIFY) 5 MG tablet; TAKE ONE (1) TABLET EACH DAY  Longstanding persistent atrial fibrillation (HCC) -     carvedilol (COREG) 3.125 MG tablet; TAKE 1 AND 1/2 TABLET TWICE A DAY -     diltiazem (CARDIZEM CD) 240 MG 24 hr capsule; Take 1 capsule (240 mg total) by mouth 2 (two) times daily.  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 -     rOPINIRole (REQUIP) 1 MG tablet; Take 1 tablet (1 mg total) by mouth at bedtime. For leg cramps  I have changed Osker Mason. Trull's diltiazem. I am also having her start on rOPINIRole. Additionally, I am having her maintain her diphenhydrAMINE, Accu-Chek FastClix Lancets, Accu-Chek Guide, HYDROcodone-acetaminophen, nabumetone, palbociclib, atorvastatin, Belsomra, DULoxetine, apixaban, metFORMIN, Ozempic (0.25 or 0.5 MG/DOSE), ARIPiprazole, busPIRone, and carvedilol.  Meds ordered this encounter  Medications   ARIPiprazole (ABILIFY) 5 MG tablet    Sig: TAKE ONE (1) TABLET EACH DAY    Dispense:  90 tablet    Refill:  2   busPIRone (BUSPAR) 10 MG tablet    Sig: Take 1 tablet (10 mg total) by mouth 3 (three) times daily as needed.    Dispense:  270 tablet    Refill:  1   carvedilol (COREG) 3.125 MG tablet    Sig: TAKE 1 AND 1/2 TABLET TWICE A DAY    Dispense:  270 tablet    Refill:  3   diltiazem (CARDIZEM CD) 240 MG 24 hr capsule    Sig: Take 1 capsule (240 mg total) by mouth  2 (two) times daily.    Dispense:  180 capsule    Refill:  3   rOPINIRole (REQUIP) 1 MG tablet    Sig: Take 1 tablet (1 mg total) by mouth at bedtime. For leg cramps    Dispense:  30 tablet    Refill:  5     Follow-up: Return in about 3 months (around 11/08/2021).  Claretta Fraise, M.D.

## 2021-08-09 LAB — CMP14+EGFR
ALT: 26 IU/L (ref 0–32)
AST: 12 IU/L (ref 0–40)
Albumin/Globulin Ratio: 1.4 (ref 1.2–2.2)
Albumin: 4.4 g/dL (ref 3.8–4.8)
Alkaline Phosphatase: 96 IU/L (ref 44–121)
BUN/Creatinine Ratio: 23 (ref 12–28)
BUN: 20 mg/dL (ref 8–27)
Bilirubin Total: 0.3 mg/dL (ref 0.0–1.2)
CO2: 24 mmol/L (ref 20–29)
Calcium: 10 mg/dL (ref 8.7–10.3)
Chloride: 99 mmol/L (ref 96–106)
Creatinine, Ser: 0.88 mg/dL (ref 0.57–1.00)
Globulin, Total: 3.1 g/dL (ref 1.5–4.5)
Glucose: 181 mg/dL — ABNORMAL HIGH (ref 70–99)
Potassium: 4.2 mmol/L (ref 3.5–5.2)
Sodium: 139 mmol/L (ref 134–144)
Total Protein: 7.5 g/dL (ref 6.0–8.5)
eGFR: 72 mL/min/{1.73_m2} (ref >59)

## 2021-08-09 LAB — CBC WITH DIFFERENTIAL/PLATELET
Basophils Absolute: 0.1 10*3/uL (ref 0.0–0.2)
Basos: 1 %
EOS (ABSOLUTE): 0 10*3/uL (ref 0.0–0.4)
Eos: 1 %
Hematocrit: 35.4 % (ref 34.0–46.6)
Hemoglobin: 12.6 g/dL (ref 11.1–15.9)
Immature Grans (Abs): 0 10*3/uL (ref 0.0–0.1)
Immature Granulocytes: 0 %
Lymphocytes Absolute: 1.8 10*3/uL (ref 0.7–3.1)
Lymphs: 47 %
MCH: 38.1 pg — ABNORMAL HIGH (ref 26.6–33.0)
MCHC: 35.6 g/dL (ref 31.5–35.7)
MCV: 107 fL — ABNORMAL HIGH (ref 79–97)
Monocytes Absolute: 0.4 10*3/uL (ref 0.1–0.9)
Monocytes: 9 %
Neutrophils Absolute: 1.6 10*3/uL (ref 1.4–7.0)
Neutrophils: 42 %
Platelets: 206 10*3/uL (ref 150–450)
RBC: 3.31 x10E6/uL — ABNORMAL LOW (ref 3.77–5.28)
RDW: 14.8 % (ref 11.7–15.4)
WBC: 3.8 10*3/uL (ref 3.4–10.8)

## 2021-08-09 LAB — LIPID PANEL
Chol/HDL Ratio: 2.3 ratio (ref 0.0–4.4)
Cholesterol, Total: 146 mg/dL (ref 100–199)
HDL: 64 mg/dL (ref >39)
LDL Chol Calc (NIH): 58 mg/dL (ref 0–99)
Triglycerides: 143 mg/dL (ref 0–149)
VLDL Cholesterol Cal: 24 mg/dL (ref 5–40)

## 2021-08-15 LAB — SPECIMEN STATUS REPORT

## 2021-08-15 LAB — B12 AND FOLATE PANEL
Folate: 3.6 ng/mL (ref >3.0)
Vitamin B-12: 1047 pg/mL (ref 232–1245)

## 2021-08-15 NOTE — Progress Notes (Signed)
Hello Lyrica,  Your lab result is normal and/or stable.Some minor variations that are not significant are commonly marked abnormal, but do not represent any medical problem for you.  Best regards, Claretta Fraise, M.D.

## 2021-08-16 DIAGNOSIS — Z79899 Other long term (current) drug therapy: Secondary | ICD-10-CM | POA: Diagnosis not present

## 2021-08-16 DIAGNOSIS — G894 Chronic pain syndrome: Secondary | ICD-10-CM | POA: Diagnosis not present

## 2021-08-16 DIAGNOSIS — Z9181 History of falling: Secondary | ICD-10-CM | POA: Diagnosis not present

## 2021-08-16 DIAGNOSIS — M62838 Other muscle spasm: Secondary | ICD-10-CM | POA: Diagnosis not present

## 2021-08-16 DIAGNOSIS — E119 Type 2 diabetes mellitus without complications: Secondary | ICD-10-CM | POA: Diagnosis not present

## 2021-08-16 DIAGNOSIS — Z6838 Body mass index (BMI) 38.0-38.9, adult: Secondary | ICD-10-CM | POA: Diagnosis not present

## 2021-08-20 ENCOUNTER — Other Ambulatory Visit: Payer: Self-pay | Admitting: *Deleted

## 2021-08-20 DIAGNOSIS — C50912 Malignant neoplasm of unspecified site of left female breast: Secondary | ICD-10-CM | POA: Diagnosis not present

## 2021-08-20 DIAGNOSIS — C7951 Secondary malignant neoplasm of bone: Secondary | ICD-10-CM | POA: Diagnosis not present

## 2021-08-20 DIAGNOSIS — C773 Secondary and unspecified malignant neoplasm of axilla and upper limb lymph nodes: Secondary | ICD-10-CM | POA: Diagnosis not present

## 2021-08-20 DIAGNOSIS — R948 Abnormal results of function studies of other organs and systems: Secondary | ICD-10-CM | POA: Diagnosis not present

## 2021-08-20 DIAGNOSIS — N281 Cyst of kidney, acquired: Secondary | ICD-10-CM | POA: Diagnosis not present

## 2021-08-20 MED ORDER — TRUE METRIX AIR GLUCOSE METER W/DEVICE KIT: PACK | 0 refills | Status: DC

## 2021-08-20 MED ORDER — TRUEPLUS LANCETS 33G MISC: 3 refills | Status: AC

## 2021-08-20 MED ORDER — TRUE METRIX BLOOD GLUCOSE TEST VI STRP: ORAL_STRIP | 3 refills | Status: DC

## 2021-08-20 MED ORDER — BD SWAB SINGLE USE REGULAR PADS: MEDICATED_PAD | 3 refills | Status: DC

## 2021-08-23 DIAGNOSIS — I10 Essential (primary) hypertension: Secondary | ICD-10-CM | POA: Diagnosis not present

## 2021-08-23 DIAGNOSIS — I4811 Longstanding persistent atrial fibrillation: Secondary | ICD-10-CM | POA: Diagnosis not present

## 2021-08-23 DIAGNOSIS — C7951 Secondary malignant neoplasm of bone: Secondary | ICD-10-CM | POA: Diagnosis not present

## 2021-08-23 DIAGNOSIS — C50912 Malignant neoplasm of unspecified site of left female breast: Secondary | ICD-10-CM | POA: Diagnosis not present

## 2021-08-23 DIAGNOSIS — D72820 Lymphocytosis (symptomatic): Secondary | ICD-10-CM | POA: Diagnosis not present

## 2021-08-23 DIAGNOSIS — Z79811 Long term (current) use of aromatase inhibitors: Secondary | ICD-10-CM | POA: Diagnosis not present

## 2021-08-23 DIAGNOSIS — C773 Secondary and unspecified malignant neoplasm of axilla and upper limb lymph nodes: Secondary | ICD-10-CM | POA: Diagnosis not present

## 2021-08-23 DIAGNOSIS — D0511 Intraductal carcinoma in situ of right breast: Secondary | ICD-10-CM | POA: Diagnosis not present

## 2021-08-23 DIAGNOSIS — Z8673 Personal history of transient ischemic attack (TIA), and cerebral infarction without residual deficits: Secondary | ICD-10-CM | POA: Diagnosis not present

## 2021-09-04 DIAGNOSIS — C50919 Malignant neoplasm of unspecified site of unspecified female breast: Secondary | ICD-10-CM | POA: Diagnosis not present

## 2021-09-04 DIAGNOSIS — Z9049 Acquired absence of other specified parts of digestive tract: Secondary | ICD-10-CM | POA: Diagnosis not present

## 2021-09-04 DIAGNOSIS — R7401 Elevation of levels of liver transaminase levels: Secondary | ICD-10-CM | POA: Diagnosis not present

## 2021-09-04 DIAGNOSIS — N281 Cyst of kidney, acquired: Secondary | ICD-10-CM | POA: Diagnosis not present

## 2021-09-05 DIAGNOSIS — M62838 Other muscle spasm: Secondary | ICD-10-CM | POA: Diagnosis not present

## 2021-09-05 DIAGNOSIS — G8929 Other chronic pain: Secondary | ICD-10-CM | POA: Diagnosis not present

## 2021-09-06 DIAGNOSIS — R7401 Elevation of levels of liver transaminase levels: Secondary | ICD-10-CM | POA: Diagnosis not present

## 2021-09-12 DIAGNOSIS — E119 Type 2 diabetes mellitus without complications: Secondary | ICD-10-CM | POA: Diagnosis not present

## 2021-09-12 DIAGNOSIS — Z9181 History of falling: Secondary | ICD-10-CM | POA: Diagnosis not present

## 2021-09-12 DIAGNOSIS — Z79899 Other long term (current) drug therapy: Secondary | ICD-10-CM | POA: Diagnosis not present

## 2021-09-12 DIAGNOSIS — M62838 Other muscle spasm: Secondary | ICD-10-CM | POA: Diagnosis not present

## 2021-09-12 DIAGNOSIS — G894 Chronic pain syndrome: Secondary | ICD-10-CM | POA: Diagnosis not present

## 2021-09-12 DIAGNOSIS — Z6838 Body mass index (BMI) 38.0-38.9, adult: Secondary | ICD-10-CM | POA: Diagnosis not present

## 2021-09-15 IMAGING — CT CT HEAD W/O CM
3 series · 15 of 47 positions shown, 18 images · non-contrast
Comparison: [id number]

CLINICAL DATA: Syncopal episode, orbital injury, persistent
headache

EXAM:
CT HEAD WITHOUT CONTRAST
TECHNIQUE: Contiguous axial images were obtained from the base of the skull
through the vertex without intravenous contrast.

[Series 2: head w o · axial · 0.42mm/px · z∈[+9,+134]mm · 9 of 31 slices shown, 12 images]
[im 3/31  brain]
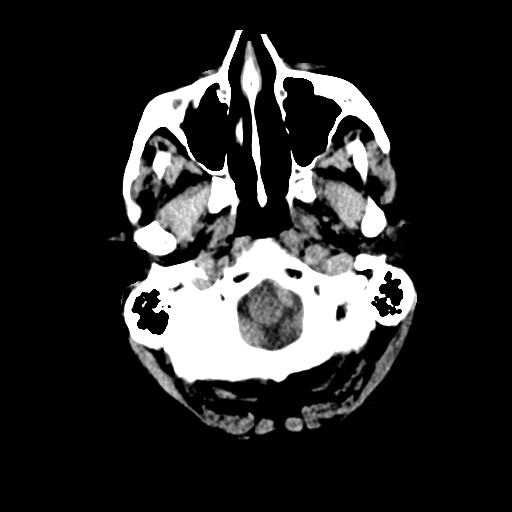
[im 3/31  bone]
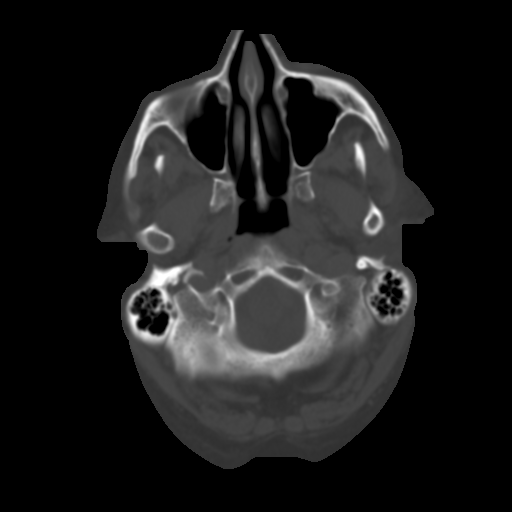
[im 6/31  brain]
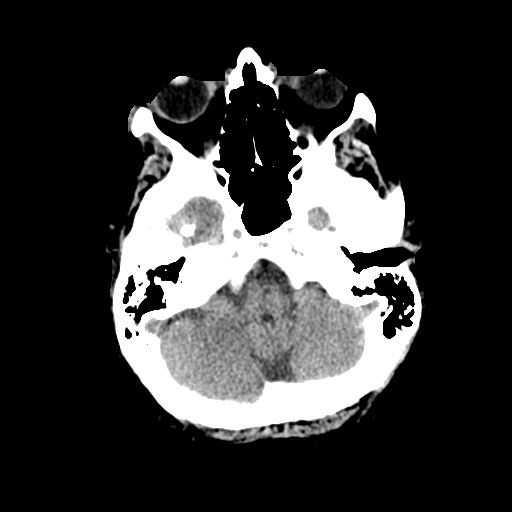
[im 9/31  brain]
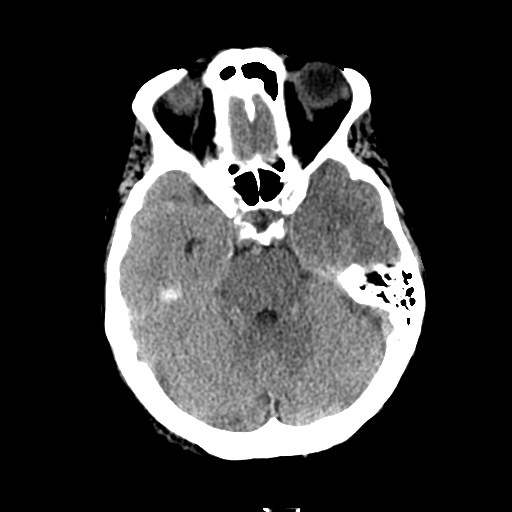
[im 12/31  brain]
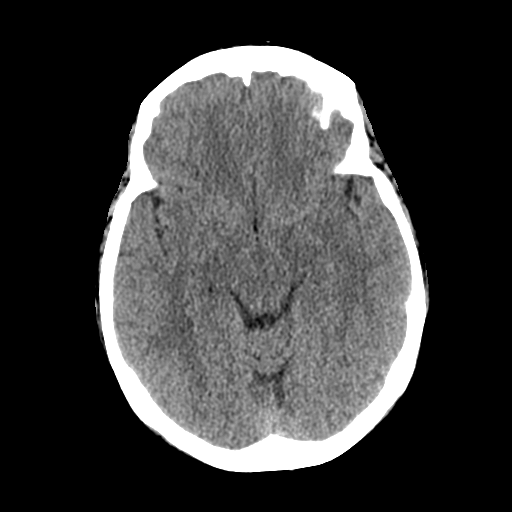
[im 16/31  brain]
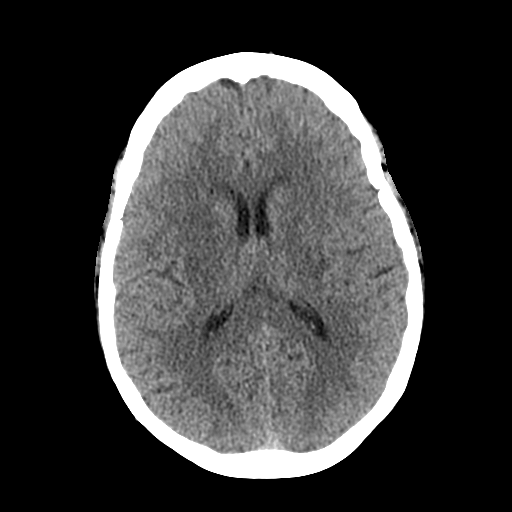
[im 16/31  bone]
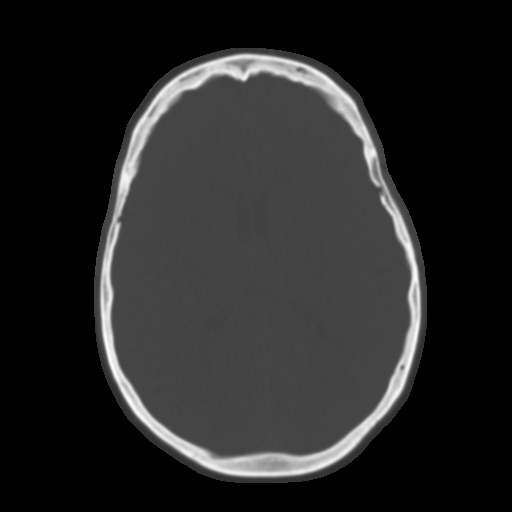
[im 19/31  brain]
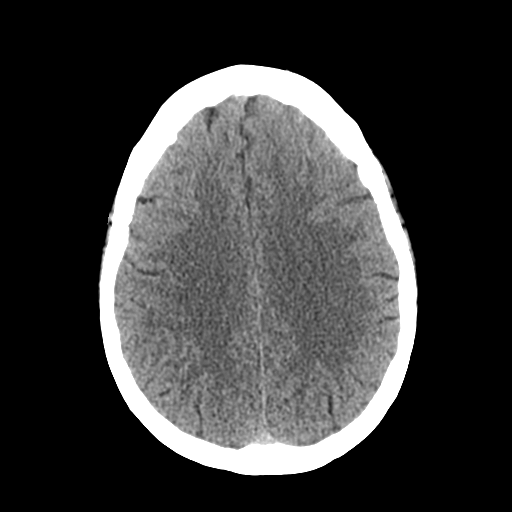
[im 22/31  brain]
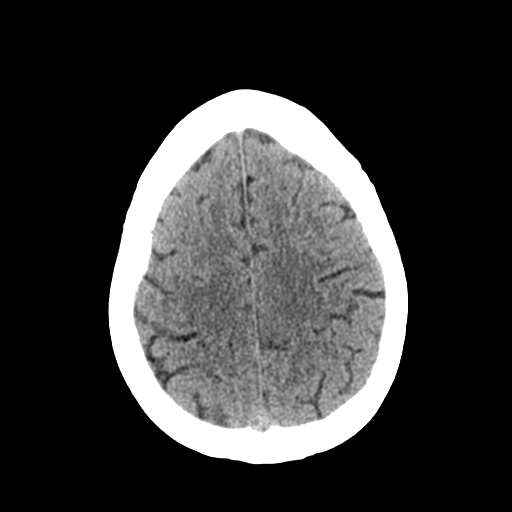
[im 25/31  brain]
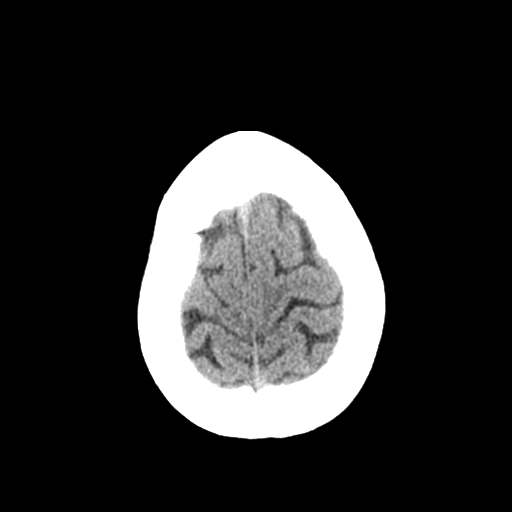
[im 28/31  brain]
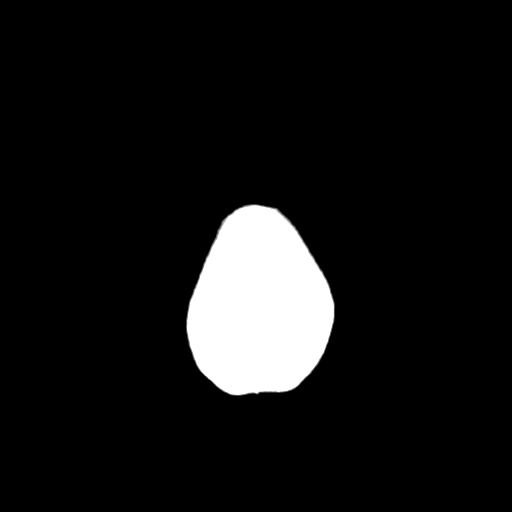
[im 28/31  bone]
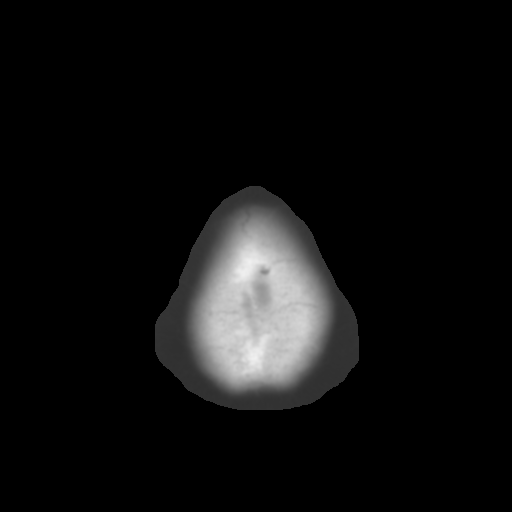

[Series 4: coronal soft · coronal · 0.31mm/px · 3 of 68 slices shown]
[im 23/68  brain]
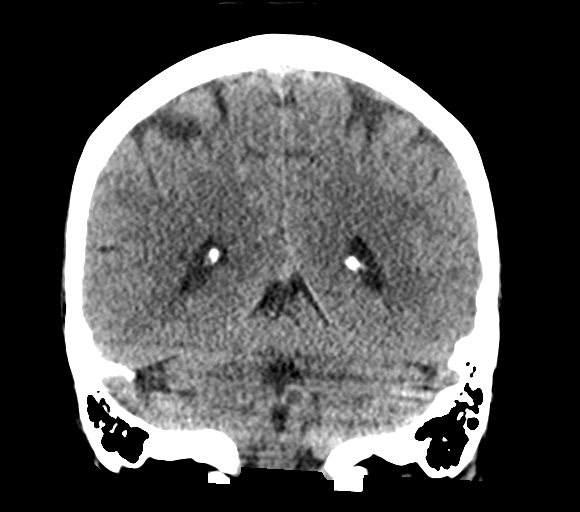
[im 30/68  brain]
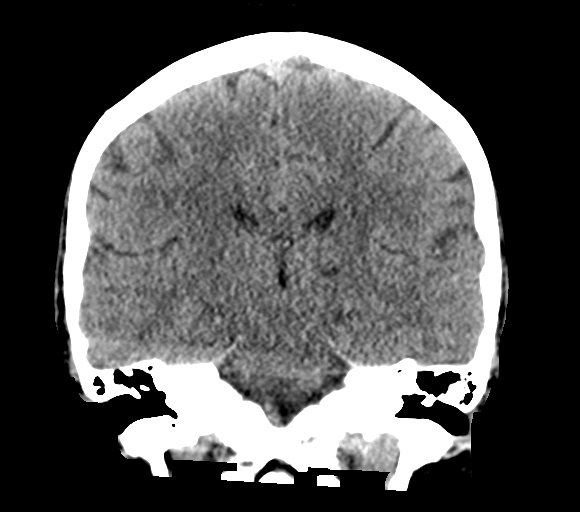
[im 38/68  brain]
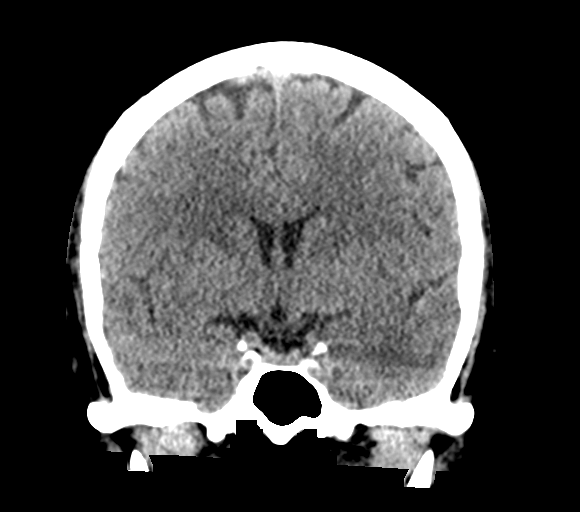

[Series 5: sagittal soft · sagittal · 0.30mm/px · 3 of 60 slices shown]
[im 20/60  brain]
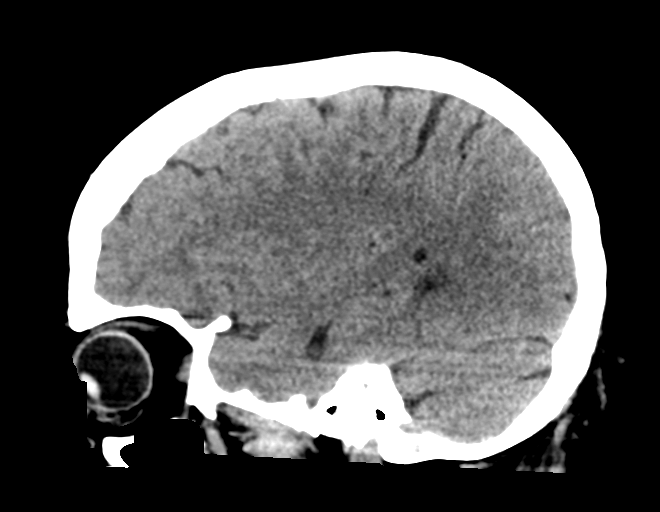
[im 30/60  brain]
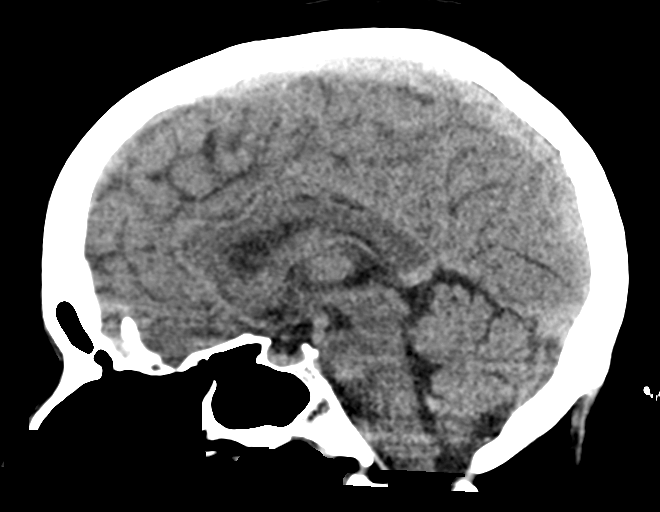
[im 40/60  brain]
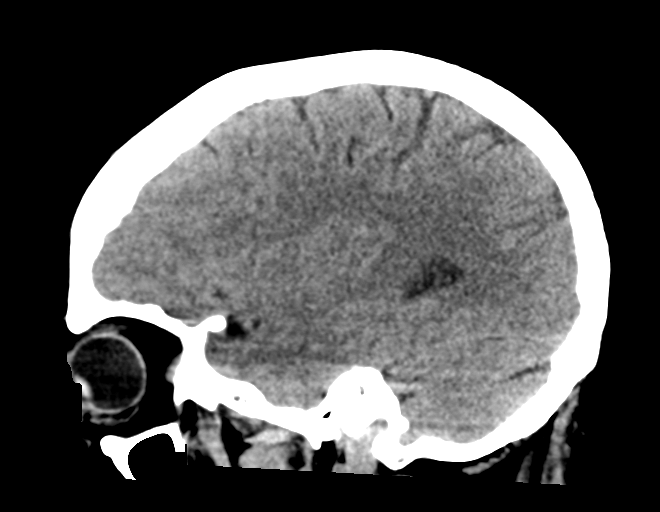

[15 of 47 positions shown; findings below may reference images not displayed]

FINDINGS: Brain: There is no acute intracranial hemorrhage, mass effect, or
edema. Gray-white differentiation is preserved. There is no
extra-axial fluid collection. Ventricles and sulci are within normal
limits in size and configuration. Minimal patchy hypoattenuation in
the supratentorial white matter is nonspecific but may reflect minor
chronic microvascular ischemic changes. There is a chronic left
thalamic infarct.

Vascular: No hyperdense vessel or unexpected calcification.

Skull: Calvarium is unremarkable.

Sinuses/Orbits: No acute finding.

Other: None.
IMPRESSION: No evidence acute intracranial injury. Stable chronic/nonemergent
findings detailed above.

## 2021-09-20 DIAGNOSIS — C7951 Secondary malignant neoplasm of bone: Secondary | ICD-10-CM | POA: Diagnosis not present

## 2021-09-20 DIAGNOSIS — D0511 Intraductal carcinoma in situ of right breast: Secondary | ICD-10-CM | POA: Diagnosis not present

## 2021-09-20 DIAGNOSIS — C773 Secondary and unspecified malignant neoplasm of axilla and upper limb lymph nodes: Secondary | ICD-10-CM | POA: Diagnosis not present

## 2021-09-20 DIAGNOSIS — C50912 Malignant neoplasm of unspecified site of left female breast: Secondary | ICD-10-CM | POA: Diagnosis not present

## 2021-10-05 DIAGNOSIS — M62838 Other muscle spasm: Secondary | ICD-10-CM | POA: Diagnosis not present

## 2021-10-05 DIAGNOSIS — G8929 Other chronic pain: Secondary | ICD-10-CM | POA: Diagnosis not present

## 2021-10-11 DIAGNOSIS — Z6838 Body mass index (BMI) 38.0-38.9, adult: Secondary | ICD-10-CM | POA: Diagnosis not present

## 2021-10-11 DIAGNOSIS — M129 Arthropathy, unspecified: Secondary | ICD-10-CM | POA: Diagnosis not present

## 2021-10-11 DIAGNOSIS — M62838 Other muscle spasm: Secondary | ICD-10-CM | POA: Diagnosis not present

## 2021-10-11 DIAGNOSIS — Z79899 Other long term (current) drug therapy: Secondary | ICD-10-CM | POA: Diagnosis not present

## 2021-10-11 DIAGNOSIS — Z9181 History of falling: Secondary | ICD-10-CM | POA: Diagnosis not present

## 2021-10-11 DIAGNOSIS — Z20822 Contact with and (suspected) exposure to covid-19: Secondary | ICD-10-CM | POA: Diagnosis not present

## 2021-10-11 DIAGNOSIS — E559 Vitamin D deficiency, unspecified: Secondary | ICD-10-CM | POA: Diagnosis not present

## 2021-10-11 DIAGNOSIS — E7800 Pure hypercholesterolemia, unspecified: Secondary | ICD-10-CM | POA: Diagnosis not present

## 2021-10-11 DIAGNOSIS — E119 Type 2 diabetes mellitus without complications: Secondary | ICD-10-CM | POA: Diagnosis not present

## 2021-10-11 DIAGNOSIS — G894 Chronic pain syndrome: Secondary | ICD-10-CM | POA: Diagnosis not present

## 2021-11-05 DIAGNOSIS — M62838 Other muscle spasm: Secondary | ICD-10-CM | POA: Diagnosis not present

## 2021-11-05 DIAGNOSIS — G894 Chronic pain syndrome: Secondary | ICD-10-CM | POA: Diagnosis not present

## 2021-11-08 DIAGNOSIS — Z79899 Other long term (current) drug therapy: Secondary | ICD-10-CM | POA: Diagnosis not present

## 2021-11-08 DIAGNOSIS — R69 Illness, unspecified: Secondary | ICD-10-CM | POA: Diagnosis not present

## 2021-11-08 DIAGNOSIS — M62838 Other muscle spasm: Secondary | ICD-10-CM | POA: Diagnosis not present

## 2021-11-08 DIAGNOSIS — E119 Type 2 diabetes mellitus without complications: Secondary | ICD-10-CM | POA: Diagnosis not present

## 2021-11-08 DIAGNOSIS — Z6838 Body mass index (BMI) 38.0-38.9, adult: Secondary | ICD-10-CM | POA: Diagnosis not present

## 2021-11-08 DIAGNOSIS — G894 Chronic pain syndrome: Secondary | ICD-10-CM | POA: Diagnosis not present

## 2021-11-08 DIAGNOSIS — Z9181 History of falling: Secondary | ICD-10-CM | POA: Diagnosis not present

## 2021-11-09 ENCOUNTER — Other Ambulatory Visit: Payer: Self-pay | Admitting: Family Medicine

## 2021-11-11 ENCOUNTER — Encounter: Payer: Self-pay | Admitting: Family Medicine

## 2021-11-11 ENCOUNTER — Ambulatory Visit (INDEPENDENT_AMBULATORY_CARE_PROVIDER_SITE_OTHER): Payer: Medicare HMO | Admitting: Family Medicine

## 2021-11-11 ENCOUNTER — Other Ambulatory Visit: Payer: Self-pay

## 2021-11-11 VITALS — BP 129/68 | HR 82 | Temp 96.7°F | Ht 63.0 in | Wt 212.4 lb

## 2021-11-11 DIAGNOSIS — L97529 Non-pressure chronic ulcer of other part of left foot with unspecified severity: Secondary | ICD-10-CM | POA: Diagnosis not present

## 2021-11-11 DIAGNOSIS — Z23 Encounter for immunization: Secondary | ICD-10-CM | POA: Diagnosis not present

## 2021-11-11 DIAGNOSIS — E785 Hyperlipidemia, unspecified: Secondary | ICD-10-CM | POA: Diagnosis not present

## 2021-11-11 DIAGNOSIS — I10 Essential (primary) hypertension: Secondary | ICD-10-CM | POA: Diagnosis not present

## 2021-11-11 DIAGNOSIS — L97519 Non-pressure chronic ulcer of other part of right foot with unspecified severity: Secondary | ICD-10-CM

## 2021-11-11 DIAGNOSIS — E118 Type 2 diabetes mellitus with unspecified complications: Secondary | ICD-10-CM | POA: Diagnosis not present

## 2021-11-11 LAB — CMP14+EGFR
ALT: 25 IU/L (ref 0–32)
AST: 39 IU/L (ref 0–40)
Albumin/Globulin Ratio: 1.7 (ref 1.2–2.2)
Albumin: 4.6 g/dL (ref 3.8–4.8)
Alkaline Phosphatase: 100 IU/L (ref 44–121)
BUN/Creatinine Ratio: 22 (ref 12–28)
BUN: 16 mg/dL (ref 8–27)
Bilirubin Total: 0.4 mg/dL (ref 0.0–1.2)
CO2: 27 mmol/L (ref 20–29)
Calcium: 9.5 mg/dL (ref 8.7–10.3)
Chloride: 96 mmol/L (ref 96–106)
Creatinine, Ser: 0.74 mg/dL (ref 0.57–1.00)
Globulin, Total: 2.7 g/dL (ref 1.5–4.5)
Glucose: 161 mg/dL — ABNORMAL HIGH (ref 70–99)
Potassium: 3.8 mmol/L (ref 3.5–5.2)
Sodium: 141 mmol/L (ref 134–144)
Total Protein: 7.3 g/dL (ref 6.0–8.5)
eGFR: 88 mL/min/{1.73_m2} (ref >59)

## 2021-11-11 LAB — CBC WITH DIFFERENTIAL/PLATELET
Basophils Absolute: 0.1 10*3/uL (ref 0.0–0.2)
Basos: 3 %
EOS (ABSOLUTE): 0 10*3/uL (ref 0.0–0.4)
Eos: 1 %
Hematocrit: 35.3 % (ref 34.0–46.6)
Hemoglobin: 11.9 g/dL (ref 11.1–15.9)
Immature Grans (Abs): 0 10*3/uL (ref 0.0–0.1)
Immature Granulocytes: 0 %
Lymphocytes Absolute: 2.2 10*3/uL (ref 0.7–3.1)
Lymphs: 56 %
MCH: 36.5 pg — ABNORMAL HIGH (ref 26.6–33.0)
MCHC: 33.7 g/dL (ref 31.5–35.7)
MCV: 108 fL — ABNORMAL HIGH (ref 79–97)
Monocytes Absolute: 0.3 10*3/uL (ref 0.1–0.9)
Monocytes: 8 %
Neutrophils Absolute: 1.3 10*3/uL — ABNORMAL LOW (ref 1.4–7.0)
Neutrophils: 32 %
Platelets: 336 10*3/uL (ref 150–450)
RBC: 3.26 x10E6/uL — ABNORMAL LOW (ref 3.77–5.28)
RDW: 16.3 % — ABNORMAL HIGH (ref 11.7–15.4)
WBC: 4 10*3/uL (ref 3.4–10.8)

## 2021-11-11 LAB — LIPID PANEL
Chol/HDL Ratio: 2.2 ratio (ref 0.0–4.4)
Cholesterol, Total: 157 mg/dL (ref 100–199)
HDL: 71 mg/dL (ref >39)
LDL Chol Calc (NIH): 55 mg/dL (ref 0–99)
Triglycerides: 192 mg/dL — ABNORMAL HIGH (ref 0–149)
VLDL Cholesterol Cal: 31 mg/dL (ref 5–40)

## 2021-11-11 LAB — BAYER DCA HB A1C WAIVED: HB A1C (BAYER DCA - WAIVED): 6.2 % — ABNORMAL HIGH (ref 4.8–5.6)

## 2021-11-11 MED ORDER — ATORVASTATIN CALCIUM 80 MG PO TABS: ORAL_TABLET | ORAL | 3 refills | Status: DC

## 2021-11-11 MED ORDER — BELSOMRA 15 MG PO TABS: ORAL_TABLET | ORAL | 5 refills | Status: DC

## 2021-11-11 MED ORDER — MUPIROCIN 2 % EX OINT: TOPICAL_OINTMENT | CUTANEOUS | 1 refills | Status: DC

## 2021-11-11 MED ORDER — BUSPIRONE HCL 10 MG PO TABS: 10.0000 mg | ORAL_TABLET | Freq: Three times a day (TID) | ORAL | 2 refills | Status: DC | PRN

## 2021-11-11 NOTE — Addendum Note (Signed)
Addended by: Baldomero Lamy B on: 11/11/2021 11:14 AM   Modules accepted: Orders

## 2021-11-11 NOTE — Progress Notes (Signed)
Subjective:  Patient ID: Diana Mathews, female    DOB: January 14, 1951  Age: 71 y.o. MRN: [id number]  CC: Medical Management of Chronic Issues   HPI ANETH SCHLAGEL presents forFollow-up of diabetes. Patient checks blood sugar at home.   103 fasting and 140 postprandial Patient denies symptoms such as polyuria, polydipsia, excessive hunger, nausea No significant hypoglycemic spells noted. Medications reviewed. Pt reports taking them regularly without complication/adverse reaction   [facility] management at [facility] is going well. No concerns. Insomnia related to pain controlled well by belsomra   in for follow-up of elevated cholesterol. Doing well without complaints on current medication. Denies side effects of statin including myalgia and arthralgia and nausea. Currently no chest pain, shortness of breath or other cardiovascular related symptoms noted.   History Tamiyah has a past medical history of Atrial fibrillation ([place]) (06/12/2018), Depression, Essential hypertension, Headache, Hyperlipidemia, Metastatic breast cancer ([place]), Osteoarthritis, Sleep apnea, TIA (transient ischemic attack), and Type 2 diabetes mellitus ([place]).   She has a past surgical history that includes Abdominal hysterectomy; Wrist surgery (Bilateral); Hernia repair; Cholecystectomy; Tubal ligation; and Foot surgery (Left).   Her family history includes Arthritis in her mother, sister, and sister; COPD in her mother; Diabetes in her brother, brother, mother, sister, and sister; Hypertension in her mother; Mental illness in her brother.She reports that she quit smoking about 36 years ago. Her smoking use included cigarettes. She has never used smokeless tobacco. She reports that she does not drink alcohol and does not use drugs.  Current Outpatient Medications on File Prior to Visit  Medication Sig Dispense Refill   Alcohol Swabs (B-D SINGLE USE SWABS REGULAR) PADS Test BS QID Dx E11.8 400 each 3   apixaban (ELIQUIS) 5 MG  TABS tablet Take 1 tablet (5 mg total) by mouth 2 (two) times daily. 180 tablet 3   ARIPiprazole (ABILIFY) 5 MG tablet TAKE ONE (1) TABLET EACH DAY 90 tablet 2   Blood Glucose Monitoring Suppl (TRUE METRIX AIR GLUCOSE METER) w/Device KIT Test BS QID Dx E11.8 1 kit 0   carvedilol (COREG) 3.125 MG tablet TAKE 1 AND 1/2 TABLET TWICE A DAY 270 tablet 3   diltiazem (CARDIZEM CD) 240 MG 24 hr capsule Take 1 capsule (240 mg total) by mouth 2 (two) times daily. 180 capsule 3   diphenhydrAMINE (BENADRYL) 25 mg capsule Take 25 mg by mouth at bedtime as needed.     DULoxetine (CYMBALTA) 60 MG capsule TAKE 1 CAPSULE EVERY MORNING AND TAKE 1 CAPSULE AT BEDTIME 180 capsule 3   glucose blood (TRUE METRIX BLOOD GLUCOSE TEST) test strip Test BS QID Dx E11.8 400 each 3   HYDROcodone-acetaminophen (NORCO) 10-325 MG tablet Take 1 tablet by mouth every 6 (six) hours as needed.     metFORMIN (GLUCOPHAGE-XR) 750 MG 24 hr tablet TAKE 1 TABLET EVERY MORNING WITH BREAKFAST & TAKE 2 TABLETS WITH SUPPER 270 tablet 3   nabumetone (RELAFEN) 500 MG tablet TAKE TWO TABLETS BY MOUTH TWICE DAILY 120 tablet 0   palbociclib (IBRANCE) 125 MG tablet Take by mouth.     rOPINIRole (REQUIP) 1 MG tablet Take 1 tablet (1 mg total) by mouth at bedtime. For leg cramps 30 tablet 5   Semaglutide,0.25 or 0.5MG/DOS, (OZEMPIC, 0.25 OR 0.5 MG/DOSE,) 2 MG/1.5ML SOPN INJECT 0.5MG SQ ONCE A WEEK 4.5 mL 3   TRUEplus Lancets 33G MISC Test BS QID Dx E11.8 400 each 3   No current facility-administered medications on file prior to visit.  ROS Review of Systems  Constitutional: Negative.   HENT: Negative.    Eyes:  Negative for visual disturbance.  Respiratory:  Negative for shortness of breath.   Cardiovascular:  Negative for chest pain.  Gastrointestinal:  Negative for abdominal pain.  Musculoskeletal:  Negative for arthralgias.   Objective:  BP 129/68    Pulse 82    Temp (!) 96.7 F (35.9 C)    Ht 5' 3"  (1.6 m)    Wt 212 lb 6.4 oz (96.3  kg)    SpO2 95%    BMI 37.62 kg/m   BP Readings from Last 3 Encounters:  11/11/21 129/68  08/08/21 138/77  05/09/21 139/80    Wt Readings from Last 3 Encounters:  11/11/21 212 lb 6.4 oz (96.3 kg)  08/08/21 215 lb (97.5 kg)  05/10/21 216 lb (98 kg)     Physical Exam Constitutional:      General: She is not in acute distress.    Appearance: She is well-developed.  Cardiovascular:     Rate and Rhythm: Normal rate and regular rhythm.  Pulmonary:     Breath sounds: Normal breath sounds.  Musculoskeletal:        General: Normal range of motion.  Skin:    General: Skin is warm and dry.     Findings: Lesion (8 crusted, excoriated ulcers at right shoulder blade region superiorly.) present. No erythema.  Neurological:     Mental Status: She is alert and oriented to person, place, and time.      Assessment & Plan:   Sitlali was seen today for medical management of chronic issues.  Diagnoses and all orders for this visit:  Controlled type 2 diabetes mellitus with complication, without long-term current use of insulin (HCC) -     Bayer DCA Hb A1c Waived -     atorvastatin (LIPITOR) 80 MG tablet; TAKE ONE (1) TABLET EACH DAY  Hyperlipidemia, unspecified hyperlipidemia type -     Lipid panel  Essential hypertension -     CBC with Differential/Platelet -     CMP14+EGFR  Skin ulcers of both feet (HCC)  Other orders -     busPIRone (BUSPAR) 10 MG tablet; Take 1 tablet (10 mg total) by mouth 3 (three) times daily as needed. -     Suvorexant (BELSOMRA) 15 MG TABS; TAKE 1 TABLET AT BEDTIME FOR SLEEP -     mupirocin ointment (BACTROBAN) 2 %; Apply and cover with bandage twice daily      I am having Osker Mason. Estelle start on mupirocin ointment. I am also having her maintain her diphenhydrAMINE, HYDROcodone-acetaminophen, nabumetone, palbociclib, DULoxetine, apixaban, metFORMIN, Ozempic (0.25 or 0.5 MG/DOSE), ARIPiprazole, carvedilol, diltiazem, rOPINIRole, True Metrix Air  Glucose Meter, True Metrix Blood Glucose Test, TRUEplus Lancets 33G, B-D SINGLE USE SWABS REGULAR, atorvastatin, busPIRone, and Belsomra.  Meds ordered this encounter  Medications   atorvastatin (LIPITOR) 80 MG tablet    Sig: TAKE ONE (1) TABLET EACH DAY    Dispense:  90 tablet    Refill:  3   busPIRone (BUSPAR) 10 MG tablet    Sig: Take 1 tablet (10 mg total) by mouth 3 (three) times daily as needed.    Dispense:  270 tablet    Refill:  2   Suvorexant (BELSOMRA) 15 MG TABS    Sig: TAKE 1 TABLET AT BEDTIME FOR SLEEP    Dispense:  30 tablet    Refill:  5   mupirocin ointment (BACTROBAN) 2 %  Sig: Apply and cover with bandage twice daily    Dispense:  30 g    Refill:  1     Follow-up: No follow-ups on file.  Claretta Fraise, M.D.

## 2021-11-12 NOTE — Progress Notes (Signed)
Hello Haydan,  Your lab result is normal and/or stable.Some minor variations that are not significant are commonly marked abnormal, but do not represent any medical problem for you.  Best regards, Claretta Fraise, M.D.

## 2021-11-13 DIAGNOSIS — D0511 Intraductal carcinoma in situ of right breast: Secondary | ICD-10-CM | POA: Diagnosis not present

## 2021-11-13 DIAGNOSIS — R69 Illness, unspecified: Secondary | ICD-10-CM | POA: Diagnosis not present

## 2021-11-13 DIAGNOSIS — C50912 Malignant neoplasm of unspecified site of left female breast: Secondary | ICD-10-CM | POA: Diagnosis not present

## 2021-11-13 DIAGNOSIS — F39 Unspecified mood [affective] disorder: Secondary | ICD-10-CM | POA: Diagnosis not present

## 2021-11-13 DIAGNOSIS — Z79899 Other long term (current) drug therapy: Secondary | ICD-10-CM | POA: Diagnosis not present

## 2021-11-13 DIAGNOSIS — C7951 Secondary malignant neoplasm of bone: Secondary | ICD-10-CM | POA: Diagnosis not present

## 2021-11-13 DIAGNOSIS — I10 Essential (primary) hypertension: Secondary | ICD-10-CM | POA: Diagnosis not present

## 2021-11-13 DIAGNOSIS — Z79811 Long term (current) use of aromatase inhibitors: Secondary | ICD-10-CM | POA: Diagnosis not present

## 2021-11-13 DIAGNOSIS — Z7901 Long term (current) use of anticoagulants: Secondary | ICD-10-CM | POA: Diagnosis not present

## 2021-11-13 DIAGNOSIS — I4811 Longstanding persistent atrial fibrillation: Secondary | ICD-10-CM | POA: Diagnosis not present

## 2021-11-13 DIAGNOSIS — E118 Type 2 diabetes mellitus with unspecified complications: Secondary | ICD-10-CM | POA: Diagnosis not present

## 2021-11-13 DIAGNOSIS — Z6839 Body mass index (BMI) 39.0-39.9, adult: Secondary | ICD-10-CM | POA: Diagnosis not present

## 2021-11-13 DIAGNOSIS — C773 Secondary and unspecified malignant neoplasm of axilla and upper limb lymph nodes: Secondary | ICD-10-CM | POA: Diagnosis not present

## 2021-11-13 DIAGNOSIS — D84821 Immunodeficiency due to drugs: Secondary | ICD-10-CM | POA: Diagnosis not present

## 2021-11-13 DIAGNOSIS — Z7984 Long term (current) use of oral hypoglycemic drugs: Secondary | ICD-10-CM | POA: Diagnosis not present

## 2021-11-13 DIAGNOSIS — Z7985 Long-term (current) use of injectable non-insulin antidiabetic drugs: Secondary | ICD-10-CM | POA: Diagnosis not present

## 2021-11-19 ENCOUNTER — Telehealth: Payer: Self-pay | Admitting: *Deleted

## 2021-11-19 ENCOUNTER — Other Ambulatory Visit: Payer: Self-pay | Admitting: Family Medicine

## 2021-11-19 DIAGNOSIS — E118 Type 2 diabetes mellitus with unspecified complications: Secondary | ICD-10-CM

## 2021-11-19 MED ORDER — METFORMIN HCL ER 750 MG PO TB24: 750.0000 mg | ORAL_TABLET | Freq: Two times a day (BID) | ORAL | 3 refills | Status: DC

## 2021-11-19 NOTE — Telephone Encounter (Signed)
Tell pt. I updated the prescription. Keep an eye on the glucose let me now if starts going too high.Marland Kitchen

## 2021-11-19 NOTE — Telephone Encounter (Signed)
[employer] medicare plan - denial for qty coverage - form sent over today  metFORMIN (GLUCOPHAGE-XR) 750 MG 24 hr tablet [[id number]]    Order Details Dose, Route, Frequency: As Directed  Dispense Quantity: 270 tablet Refills: 3        Sig: TAKE 1 TABLET EVERY MORNING WITH BREAKFAST & TAKE 2 TABLETS WITH SUPPER    Will not be covered for more than 2 tabs / per day. Currently written for 3 / day    Plan has given TEMP supply, we will need to change dosing.

## 2021-11-19 NOTE — Telephone Encounter (Signed)
Pt aware of provider feedback and voiced understanding. 

## 2021-12-06 DIAGNOSIS — G894 Chronic pain syndrome: Secondary | ICD-10-CM | POA: Diagnosis not present

## 2021-12-06 DIAGNOSIS — R69 Illness, unspecified: Secondary | ICD-10-CM | POA: Diagnosis not present

## 2021-12-06 DIAGNOSIS — Z79899 Other long term (current) drug therapy: Secondary | ICD-10-CM | POA: Diagnosis not present

## 2021-12-06 DIAGNOSIS — Z9181 History of falling: Secondary | ICD-10-CM | POA: Diagnosis not present

## 2021-12-06 DIAGNOSIS — Z6838 Body mass index (BMI) 38.0-38.9, adult: Secondary | ICD-10-CM | POA: Diagnosis not present

## 2021-12-06 DIAGNOSIS — E119 Type 2 diabetes mellitus without complications: Secondary | ICD-10-CM | POA: Diagnosis not present

## 2021-12-06 DIAGNOSIS — M62838 Other muscle spasm: Secondary | ICD-10-CM | POA: Diagnosis not present

## 2021-12-11 DIAGNOSIS — D0511 Intraductal carcinoma in situ of right breast: Secondary | ICD-10-CM | POA: Diagnosis not present

## 2021-12-11 DIAGNOSIS — Z5112 Encounter for antineoplastic immunotherapy: Secondary | ICD-10-CM | POA: Diagnosis not present

## 2021-12-11 DIAGNOSIS — C773 Secondary and unspecified malignant neoplasm of axilla and upper limb lymph nodes: Secondary | ICD-10-CM | POA: Diagnosis not present

## 2021-12-11 DIAGNOSIS — C50912 Malignant neoplasm of unspecified site of left female breast: Secondary | ICD-10-CM | POA: Diagnosis not present

## 2021-12-11 DIAGNOSIS — C7951 Secondary malignant neoplasm of bone: Secondary | ICD-10-CM | POA: Diagnosis not present

## 2021-12-16 DIAGNOSIS — R748 Abnormal levels of other serum enzymes: Secondary | ICD-10-CM | POA: Diagnosis not present

## 2022-01-02 ENCOUNTER — Other Ambulatory Visit: Payer: Self-pay | Admitting: Family Medicine

## 2022-01-02 DIAGNOSIS — E119 Type 2 diabetes mellitus without complications: Secondary | ICD-10-CM | POA: Diagnosis not present

## 2022-01-02 DIAGNOSIS — Z0000 Encounter for general adult medical examination without abnormal findings: Secondary | ICD-10-CM | POA: Diagnosis not present

## 2022-01-02 DIAGNOSIS — Z6838 Body mass index (BMI) 38.0-38.9, adult: Secondary | ICD-10-CM | POA: Diagnosis not present

## 2022-01-02 DIAGNOSIS — I739 Peripheral vascular disease, unspecified: Secondary | ICD-10-CM | POA: Diagnosis not present

## 2022-01-02 DIAGNOSIS — Z79899 Other long term (current) drug therapy: Secondary | ICD-10-CM | POA: Diagnosis not present

## 2022-01-02 DIAGNOSIS — G894 Chronic pain syndrome: Secondary | ICD-10-CM | POA: Diagnosis not present

## 2022-01-02 DIAGNOSIS — Z9181 History of falling: Secondary | ICD-10-CM | POA: Diagnosis not present

## 2022-01-02 DIAGNOSIS — Z1159 Encounter for screening for other viral diseases: Secondary | ICD-10-CM | POA: Diagnosis not present

## 2022-01-02 DIAGNOSIS — E559 Vitamin D deficiency, unspecified: Secondary | ICD-10-CM | POA: Diagnosis not present

## 2022-01-02 DIAGNOSIS — E7800 Pure hypercholesterolemia, unspecified: Secondary | ICD-10-CM | POA: Diagnosis not present

## 2022-01-02 DIAGNOSIS — M62838 Other muscle spasm: Secondary | ICD-10-CM | POA: Diagnosis not present

## 2022-01-03 DIAGNOSIS — G894 Chronic pain syndrome: Secondary | ICD-10-CM | POA: Diagnosis not present

## 2022-01-03 DIAGNOSIS — M62838 Other muscle spasm: Secondary | ICD-10-CM | POA: Diagnosis not present

## 2022-01-08 DIAGNOSIS — C773 Secondary and unspecified malignant neoplasm of axilla and upper limb lymph nodes: Secondary | ICD-10-CM | POA: Diagnosis not present

## 2022-01-08 DIAGNOSIS — I4811 Longstanding persistent atrial fibrillation: Secondary | ICD-10-CM | POA: Diagnosis not present

## 2022-01-08 DIAGNOSIS — D539 Nutritional anemia, unspecified: Secondary | ICD-10-CM | POA: Diagnosis not present

## 2022-01-08 DIAGNOSIS — Z5112 Encounter for antineoplastic immunotherapy: Secondary | ICD-10-CM | POA: Diagnosis not present

## 2022-01-08 DIAGNOSIS — C50912 Malignant neoplasm of unspecified site of left female breast: Secondary | ICD-10-CM | POA: Diagnosis not present

## 2022-01-08 DIAGNOSIS — D0511 Intraductal carcinoma in situ of right breast: Secondary | ICD-10-CM | POA: Diagnosis not present

## 2022-01-08 DIAGNOSIS — C7951 Secondary malignant neoplasm of bone: Secondary | ICD-10-CM | POA: Diagnosis not present

## 2022-01-08 DIAGNOSIS — E118 Type 2 diabetes mellitus with unspecified complications: Secondary | ICD-10-CM | POA: Diagnosis not present

## 2022-01-08 DIAGNOSIS — D84821 Immunodeficiency due to drugs: Secondary | ICD-10-CM | POA: Diagnosis not present

## 2022-01-08 DIAGNOSIS — Z79899 Other long term (current) drug therapy: Secondary | ICD-10-CM | POA: Diagnosis not present

## 2022-01-08 DIAGNOSIS — Z6837 Body mass index (BMI) 37.0-37.9, adult: Secondary | ICD-10-CM | POA: Diagnosis not present

## 2022-01-17 DIAGNOSIS — I739 Peripheral vascular disease, unspecified: Secondary | ICD-10-CM | POA: Diagnosis not present

## 2022-01-17 DIAGNOSIS — R0989 Other specified symptoms and signs involving the circulatory and respiratory systems: Secondary | ICD-10-CM | POA: Diagnosis not present

## 2022-01-29 DIAGNOSIS — G894 Chronic pain syndrome: Secondary | ICD-10-CM | POA: Diagnosis not present

## 2022-01-29 DIAGNOSIS — Z6836 Body mass index (BMI) 36.0-36.9, adult: Secondary | ICD-10-CM | POA: Diagnosis not present

## 2022-01-29 DIAGNOSIS — Z79899 Other long term (current) drug therapy: Secondary | ICD-10-CM | POA: Diagnosis not present

## 2022-02-03 DIAGNOSIS — G894 Chronic pain syndrome: Secondary | ICD-10-CM | POA: Diagnosis not present

## 2022-02-03 DIAGNOSIS — M62838 Other muscle spasm: Secondary | ICD-10-CM | POA: Diagnosis not present

## 2022-02-05 ENCOUNTER — Telehealth: Payer: Self-pay | Admitting: *Deleted

## 2022-02-05 ENCOUNTER — Other Ambulatory Visit: Payer: Self-pay | Admitting: Family Medicine

## 2022-02-05 MED ORDER — OZEMPIC (0.25 OR 0.5 MG/DOSE) 2 MG/3ML ~~LOC~~ SOPN: 0.5000 mg | PEN_INJECTOR | SUBCUTANEOUS | 1 refills | Status: DC

## 2022-02-05 NOTE — Telephone Encounter (Signed)
TC from [place], needing to change Ozempic from the 2 mg/1.5 ml since this has been DCd to the 2 mg/3 ml dose ?You can type in [id number] in new order, then hit database to find this ? ?

## 2022-02-05 NOTE — Telephone Encounter (Signed)
Please let the patient know that I sent their prescription to their pharmacy. Thanks, WS 

## 2022-02-10 ENCOUNTER — Encounter: Payer: Self-pay | Admitting: Family Medicine

## 2022-02-10 ENCOUNTER — Ambulatory Visit (INDEPENDENT_AMBULATORY_CARE_PROVIDER_SITE_OTHER): Payer: Medicare HMO | Admitting: Family Medicine

## 2022-02-10 VITALS — BP 113/59 | HR 93 | Temp 97.0°F | Ht 63.0 in | Wt 213.6 lb

## 2022-02-10 DIAGNOSIS — I10 Essential (primary) hypertension: Secondary | ICD-10-CM | POA: Diagnosis not present

## 2022-02-10 DIAGNOSIS — E785 Hyperlipidemia, unspecified: Secondary | ICD-10-CM | POA: Diagnosis not present

## 2022-02-10 DIAGNOSIS — E118 Type 2 diabetes mellitus with unspecified complications: Secondary | ICD-10-CM

## 2022-02-10 LAB — CMP14+EGFR
ALT: 181 IU/L — ABNORMAL HIGH (ref 0–32)
AST: 327 IU/L — ABNORMAL HIGH (ref 0–40)
Albumin/Globulin Ratio: 1.6 (ref 1.2–2.2)
Albumin: 4.2 g/dL (ref 3.8–4.8)
Alkaline Phosphatase: 116 IU/L (ref 44–121)
BUN/Creatinine Ratio: 16 (ref 12–28)
BUN: 12 mg/dL (ref 8–27)
Bilirubin Total: 0.6 mg/dL (ref 0.0–1.2)
CO2: 25 mmol/L (ref 20–29)
Calcium: 9.3 mg/dL (ref 8.7–10.3)
Chloride: 98 mmol/L (ref 96–106)
Creatinine, Ser: 0.73 mg/dL (ref 0.57–1.00)
Globulin, Total: 2.7 g/dL (ref 1.5–4.5)
Glucose: 310 mg/dL — ABNORMAL HIGH (ref 70–99)
Potassium: 3.5 mmol/L (ref 3.5–5.2)
Sodium: 139 mmol/L (ref 134–144)
Total Protein: 6.9 g/dL (ref 6.0–8.5)
eGFR: 90 mL/min/{1.73_m2} (ref >59)

## 2022-02-10 LAB — LIPID PANEL
Chol/HDL Ratio: 1.7 ratio (ref 0.0–4.4)
Cholesterol, Total: 123 mg/dL (ref 100–199)
HDL: 73 mg/dL (ref >39)
LDL Chol Calc (NIH): 30 mg/dL (ref 0–99)
Triglycerides: 111 mg/dL (ref 0–149)
VLDL Cholesterol Cal: 20 mg/dL (ref 5–40)

## 2022-02-10 LAB — CBC WITH DIFFERENTIAL/PLATELET
Basophils Absolute: 0 10*3/uL (ref 0.0–0.2)
Basos: 2 %
EOS (ABSOLUTE): 0 10*3/uL (ref 0.0–0.4)
Eos: 1 %
Hematocrit: 32.4 % — ABNORMAL LOW (ref 34.0–46.6)
Hemoglobin: 10.8 g/dL — ABNORMAL LOW (ref 11.1–15.9)
Immature Grans (Abs): 0 10*3/uL (ref 0.0–0.1)
Immature Granulocytes: 1 %
Lymphocytes Absolute: 0.9 10*3/uL (ref 0.7–3.1)
Lymphs: 43 %
MCH: 37.6 pg — ABNORMAL HIGH (ref 26.6–33.0)
MCHC: 33.3 g/dL (ref 31.5–35.7)
MCV: 113 fL — ABNORMAL HIGH (ref 79–97)
Monocytes Absolute: 0.2 10*3/uL (ref 0.1–0.9)
Monocytes: 11 %
Neutrophils Absolute: 0.8 10*3/uL — ABNORMAL LOW (ref 1.4–7.0)
Neutrophils: 42 %
Platelets: 213 10*3/uL (ref 150–450)
RBC: 2.87 x10E6/uL — ABNORMAL LOW (ref 3.77–5.28)
RDW: 15.4 % (ref 11.7–15.4)
WBC: 2 10*3/uL — CL (ref 3.4–10.8)

## 2022-02-10 LAB — BAYER DCA HB A1C WAIVED: HB A1C (BAYER DCA - WAIVED): 6.3 % — ABNORMAL HIGH (ref 4.8–5.6)

## 2022-02-10 NOTE — Progress Notes (Signed)
? ?Subjective:  ?Patient ID: Diana Mathews,  ?female    DOB: 11/16/1950  Age: 71 y.o.  ? ? ?CC: Medical Management of Chronic Issues ? ? ?HPI ?Diana Mathews presents for  follow-up of hypertension. Patient has no history of headache chest pain or shortness of breath or recent cough. Patient also denies symptoms of TIA such as numbness weakness lateralizing. Patient denies side effects from medication. States taking it regularly. ? ?Patient also  in for follow-up of elevated cholesterol. Doing well without complaints on current medication. Denies side effects  including myalgia and arthralgia and nausea. Also in today for liver function testing. Currently no chest pain, shortness of breath or other cardiovascular related symptoms noted. ? ?Follow-up of diabetes. Patient does check blood sugar at home. Readings run between 132 and 136. Watching what she is eating.  ?Patient denies symptoms such as excessive hunger or urinary frequency, excessive hunger, nausea ?No significant hypoglycemic spells noted. ?Medications reviewed. Pt reports taking them regularly. Pt. denies complication/adverse reaction today.  ? ? ?History ?Diana Mathews has a past medical history of Atrial fibrillation ([place]) (06/12/2018), Depression, Essential hypertension, Headache, Hyperlipidemia, Metastatic breast cancer, Osteoarthritis, Sleep apnea, TIA (transient ischemic attack), and Type 2 diabetes mellitus ([place]).  ? ?She has a past surgical history that includes Abdominal hysterectomy; Wrist surgery (Bilateral); Hernia repair; Cholecystectomy; Tubal ligation; and Foot surgery (Left).  ? ?Her family history includes Arthritis in her mother, sister, and sister; COPD in her mother; Diabetes in her brother, brother, mother, sister, and sister; Hypertension in her mother; Mental illness in her brother.She reports that she quit smoking about 37 years ago. Her smoking use included cigarettes. She has never used smokeless tobacco. She reports that she  does not drink alcohol and does not use drugs. ? ?Current Outpatient Medications on File Prior to Visit  ?Medication Sig Dispense Refill  ? Alcohol Swabs (B-D SINGLE USE SWABS REGULAR) PADS Test BS QID Dx E11.8 400 each 3  ? apixaban (ELIQUIS) 5 MG TABS tablet Take 1 tablet (5 mg total) by mouth 2 (two) times daily. 180 tablet 3  ? ARIPiprazole (ABILIFY) 5 MG tablet TAKE ONE (1) TABLET EACH DAY 90 tablet 2  ? atorvastatin (LIPITOR) 80 MG tablet TAKE ONE (1) TABLET EACH DAY 90 tablet 3  ? Blood Glucose Monitoring Suppl (TRUE METRIX AIR GLUCOSE METER) w/Device KIT Test BS QID Dx E11.8 1 kit 0  ? busPIRone (BUSPAR) 10 MG tablet Take 1 tablet (10 mg total) by mouth 3 (three) times daily as needed. 270 tablet 2  ? carvedilol (COREG) 3.125 MG tablet TAKE 1 AND 1/2 TABLET TWICE A DAY 270 tablet 3  ? diltiazem (CARDIZEM CD) 240 MG 24 hr capsule Take 1 capsule (240 mg total) by mouth 2 (two) times daily. 180 capsule 3  ? diphenhydrAMINE (BENADRYL) 25 mg capsule Take 25 mg by mouth at bedtime as needed.    ? DULoxetine (CYMBALTA) 60 MG capsule TAKE 1 CAPSULE EVERY MORNING AND TAKE 1 CAPSULE AT BEDTIME 180 capsule 3  ? glucose blood (TRUE METRIX BLOOD GLUCOSE TEST) test strip Test BS QID Dx E11.8 400 each 3  ? HYDROcodone-acetaminophen (NORCO) 10-325 MG tablet Take 1 tablet by mouth every 6 (six) hours as needed.    ? metFORMIN (GLUCOPHAGE-XR) 750 MG 24 hr tablet Take 1 tablet (750 mg total) by mouth 2 (two) times daily. 180 tablet 3  ? nabumetone (RELAFEN) 500 MG tablet TAKE TWO TABLETS BY MOUTH TWICE DAILY 120 tablet 0  ?  palbociclib (IBRANCE) 125 MG tablet Take by mouth.    ? rOPINIRole (REQUIP) 1 MG tablet TAKE ONE TABLET BY MOUTH AT BEDTIME 30 tablet 1  ? Semaglutide,0.25 or 0.5MG/DOS, (OZEMPIC, 0.25 OR 0.5 MG/DOSE,) 2 MG/3ML SOPN Inject 0.5 mg into the skin once a week. 3 mL 1  ? Suvorexant (BELSOMRA) 15 MG TABS TAKE 1 TABLET AT BEDTIME FOR SLEEP 30 tablet 5  ? TRUEplus Lancets 33G MISC Test BS QID Dx E11.8 400 each 3   ? ?No current facility-administered medications on file prior to visit.  ? ? ?ROS ?Review of Systems  ?Constitutional: Negative.   ?HENT: Negative.    ?Eyes:  Negative for visual disturbance.  ?Respiratory:  Negative for shortness of breath.   ?Cardiovascular:  Negative for chest pain.  ?Gastrointestinal:  Negative for abdominal pain.  ?Musculoskeletal:  Negative for arthralgias.  ? ?Objective:  ?BP (!) 113/59   Pulse 93   Temp (!) 97 ?F (36.1 ?C)   Ht 5' 3"  (1.6 m)   Wt 213 lb 9.6 oz (96.9 kg)   SpO2 94%   BMI 37.84 kg/m?  ? ?BP Readings from Last 3 Encounters:  ?02/10/22 (!) 113/59  ?11/11/21 129/68  ?08/08/21 138/77  ? ? ?Wt Readings from Last 3 Encounters:  ?02/10/22 213 lb 9.6 oz (96.9 kg)  ?11/11/21 212 lb 6.4 oz (96.3 kg)  ?08/08/21 215 lb (97.5 kg)  ? ? ? ?Physical Exam ?Constitutional:   ?   General: She is not in acute distress. ?   Appearance: She is well-developed.  ?Cardiovascular:  ?   Rate and Rhythm: Normal rate and regular rhythm.  ?Pulmonary:  ?   Breath sounds: Normal breath sounds.  ?Musculoskeletal:     ?   General: Normal range of motion.  ?Skin: ?   General: Skin is warm and dry.  ?Neurological:  ?   Mental Status: She is alert and oriented to person, place, and time.  ? ? ?Diabetic Foot Exam - Simple   ?No data filed ?  ? ? ? ? ?Assessment & Plan:  ? ?[name] was seen today for medical management of chronic issues. ? ?Diagnoses and all orders for this visit: ? ?Essential hypertension ?-     CBC with Differential/Platelet ?-     CMP14+EGFR ? ?Hyperlipidemia, unspecified hyperlipidemia type ?-     Lipid panel ? ?Controlled type 2 diabetes mellitus with complication, without long-term current use of insulin ([place]) ?-     Bayer DCA Hb A1c Waived ?-     Microalbumin / creatinine urine ratio ? ? ?I am having Diana Mathews. [place] maintain her diphenhydrAMINE, HYDROcodone-acetaminophen, nabumetone, palbociclib, DULoxetine, apixaban, ARIPiprazole, carvedilol, diltiazem, True Metrix Air Glucose Meter,  True Metrix Blood Glucose Test, TRUEplus Lancets 33G, B-D SINGLE USE SWABS REGULAR, atorvastatin, busPIRone, Belsomra, metFORMIN, rOPINIRole, and Ozempic (0.25 or 0.5 MG/DOSE). ? ?No orders of the defined types were placed in this encounter. ? ? ? ?Follow-up: Return in about 3 months (around 05/12/2022). ? ?Claretta Fraise, M.D. ?

## 2022-02-11 ENCOUNTER — Other Ambulatory Visit: Payer: Self-pay | Admitting: Family Medicine

## 2022-02-11 DIAGNOSIS — R7401 Elevation of levels of liver transaminase levels: Secondary | ICD-10-CM

## 2022-02-11 LAB — MICROALBUMIN / CREATININE URINE RATIO
Creatinine, Urine: 204.7 mg/dL
Microalb/Creat Ratio: 17 mg/g creat (ref 0–29)
Microalbumin, Urine: 34 ug/mL

## 2022-02-20 DIAGNOSIS — C50919 Malignant neoplasm of unspecified site of unspecified female breast: Secondary | ICD-10-CM | POA: Diagnosis not present

## 2022-02-20 DIAGNOSIS — D0511 Intraductal carcinoma in situ of right breast: Secondary | ICD-10-CM | POA: Diagnosis not present

## 2022-02-20 DIAGNOSIS — C773 Secondary and unspecified malignant neoplasm of axilla and upper limb lymph nodes: Secondary | ICD-10-CM | POA: Diagnosis not present

## 2022-02-20 DIAGNOSIS — C50912 Malignant neoplasm of unspecified site of left female breast: Secondary | ICD-10-CM | POA: Diagnosis not present

## 2022-02-20 DIAGNOSIS — Z5112 Encounter for antineoplastic immunotherapy: Secondary | ICD-10-CM | POA: Diagnosis not present

## 2022-02-20 DIAGNOSIS — C7951 Secondary malignant neoplasm of bone: Secondary | ICD-10-CM | POA: Diagnosis not present

## 2022-02-20 DIAGNOSIS — C50412 Malignant neoplasm of upper-outer quadrant of left female breast: Secondary | ICD-10-CM | POA: Diagnosis not present

## 2022-02-20 DIAGNOSIS — D539 Nutritional anemia, unspecified: Secondary | ICD-10-CM | POA: Diagnosis not present

## 2022-02-21 ENCOUNTER — Ambulatory Visit (HOSPITAL_COMMUNITY)
Admission: RE | Admit: 2022-02-21 | Discharge: 2022-02-21 | Disposition: A | Discharge status: Home / Self Care | Payer: Medicare HMO | Source: Ambulatory Visit | Attending: Family Medicine | Admitting: Family Medicine

## 2022-02-21 DIAGNOSIS — K7689 Other specified diseases of liver: Secondary | ICD-10-CM | POA: Diagnosis not present

## 2022-02-21 DIAGNOSIS — R7401 Elevation of levels of liver transaminase levels: Secondary | ICD-10-CM | POA: Diagnosis not present

## 2022-03-03 ENCOUNTER — Other Ambulatory Visit: Payer: Self-pay | Admitting: Family Medicine

## 2022-03-04 DIAGNOSIS — R69 Illness, unspecified: Secondary | ICD-10-CM | POA: Diagnosis not present

## 2022-03-04 DIAGNOSIS — Z79899 Other long term (current) drug therapy: Secondary | ICD-10-CM | POA: Diagnosis not present

## 2022-03-04 DIAGNOSIS — G894 Chronic pain syndrome: Secondary | ICD-10-CM | POA: Diagnosis not present

## 2022-03-04 DIAGNOSIS — Z6837 Body mass index (BMI) 37.0-37.9, adult: Secondary | ICD-10-CM | POA: Diagnosis not present

## 2022-03-05 ENCOUNTER — Telehealth: Payer: Self-pay | Admitting: Gastroenterology

## 2022-03-05 DIAGNOSIS — M62838 Other muscle spasm: Secondary | ICD-10-CM | POA: Diagnosis not present

## 2022-03-05 DIAGNOSIS — G894 Chronic pain syndrome: Secondary | ICD-10-CM | POA: Diagnosis not present

## 2022-03-05 NOTE — Telephone Encounter (Signed)
Diana Mathews, I spoke with Kirby Crigler, PA-C with [facility] through [facility]. He would like to refer patient for IDA. She will be a new patient.  ? ?Thanks! ?

## 2022-03-10 DIAGNOSIS — E611 Iron deficiency: Secondary | ICD-10-CM | POA: Diagnosis not present

## 2022-03-17 DIAGNOSIS — C773 Secondary and unspecified malignant neoplasm of axilla and upper limb lymph nodes: Secondary | ICD-10-CM | POA: Diagnosis not present

## 2022-03-17 DIAGNOSIS — M170 Bilateral primary osteoarthritis of knee: Secondary | ICD-10-CM | POA: Diagnosis not present

## 2022-03-17 DIAGNOSIS — I2510 Atherosclerotic heart disease of native coronary artery without angina pectoris: Secondary | ICD-10-CM | POA: Diagnosis not present

## 2022-03-17 DIAGNOSIS — C50912 Malignant neoplasm of unspecified site of left female breast: Secondary | ICD-10-CM | POA: Diagnosis not present

## 2022-03-17 DIAGNOSIS — N281 Cyst of kidney, acquired: Secondary | ICD-10-CM | POA: Diagnosis not present

## 2022-03-17 DIAGNOSIS — C7951 Secondary malignant neoplasm of bone: Secondary | ICD-10-CM | POA: Diagnosis not present

## 2022-03-20 DIAGNOSIS — Z79811 Long term (current) use of aromatase inhibitors: Secondary | ICD-10-CM | POA: Diagnosis not present

## 2022-03-20 DIAGNOSIS — D0511 Intraductal carcinoma in situ of right breast: Secondary | ICD-10-CM | POA: Diagnosis not present

## 2022-03-20 DIAGNOSIS — Z79899 Other long term (current) drug therapy: Secondary | ICD-10-CM | POA: Diagnosis not present

## 2022-03-20 DIAGNOSIS — C50912 Malignant neoplasm of unspecified site of left female breast: Secondary | ICD-10-CM | POA: Diagnosis not present

## 2022-03-20 DIAGNOSIS — E118 Type 2 diabetes mellitus with unspecified complications: Secondary | ICD-10-CM | POA: Diagnosis not present

## 2022-03-20 DIAGNOSIS — D84821 Immunodeficiency due to drugs: Secondary | ICD-10-CM | POA: Diagnosis not present

## 2022-03-20 DIAGNOSIS — T451X5A Adverse effect of antineoplastic and immunosuppressive drugs, initial encounter: Secondary | ICD-10-CM | POA: Insufficient documentation

## 2022-03-20 DIAGNOSIS — Z8673 Personal history of transient ischemic attack (TIA), and cerebral infarction without residual deficits: Secondary | ICD-10-CM | POA: Diagnosis not present

## 2022-03-20 DIAGNOSIS — Z6837 Body mass index (BMI) 37.0-37.9, adult: Secondary | ICD-10-CM | POA: Diagnosis not present

## 2022-03-20 DIAGNOSIS — C50919 Malignant neoplasm of unspecified site of unspecified female breast: Secondary | ICD-10-CM | POA: Diagnosis not present

## 2022-03-20 DIAGNOSIS — E611 Iron deficiency: Secondary | ICD-10-CM | POA: Diagnosis not present

## 2022-03-20 DIAGNOSIS — C7951 Secondary malignant neoplasm of bone: Secondary | ICD-10-CM | POA: Diagnosis not present

## 2022-03-20 DIAGNOSIS — D72820 Lymphocytosis (symptomatic): Secondary | ICD-10-CM | POA: Diagnosis not present

## 2022-03-20 DIAGNOSIS — I10 Essential (primary) hypertension: Secondary | ICD-10-CM | POA: Diagnosis not present

## 2022-03-20 DIAGNOSIS — Z5112 Encounter for antineoplastic immunotherapy: Secondary | ICD-10-CM | POA: Diagnosis not present

## 2022-03-20 DIAGNOSIS — I482 Chronic atrial fibrillation, unspecified: Secondary | ICD-10-CM | POA: Diagnosis not present

## 2022-03-20 DIAGNOSIS — C773 Secondary and unspecified malignant neoplasm of axilla and upper limb lymph nodes: Secondary | ICD-10-CM | POA: Diagnosis not present

## 2022-03-20 DIAGNOSIS — Z7901 Long term (current) use of anticoagulants: Secondary | ICD-10-CM | POA: Diagnosis not present

## 2022-03-20 DIAGNOSIS — D701 Agranulocytosis secondary to cancer chemotherapy: Secondary | ICD-10-CM | POA: Diagnosis not present

## 2022-03-28 DIAGNOSIS — C50912 Malignant neoplasm of unspecified site of left female breast: Secondary | ICD-10-CM | POA: Diagnosis not present

## 2022-03-28 DIAGNOSIS — C773 Secondary and unspecified malignant neoplasm of axilla and upper limb lymph nodes: Secondary | ICD-10-CM | POA: Diagnosis not present

## 2022-04-02 ENCOUNTER — Other Ambulatory Visit: Payer: Self-pay | Admitting: Family Medicine

## 2022-04-03 DIAGNOSIS — G894 Chronic pain syndrome: Secondary | ICD-10-CM | POA: Diagnosis not present

## 2022-04-03 DIAGNOSIS — R69 Illness, unspecified: Secondary | ICD-10-CM | POA: Diagnosis not present

## 2022-04-03 DIAGNOSIS — Z79899 Other long term (current) drug therapy: Secondary | ICD-10-CM | POA: Diagnosis not present

## 2022-04-05 DIAGNOSIS — M62838 Other muscle spasm: Secondary | ICD-10-CM | POA: Diagnosis not present

## 2022-04-05 DIAGNOSIS — G894 Chronic pain syndrome: Secondary | ICD-10-CM | POA: Diagnosis not present

## 2022-04-09 ENCOUNTER — Other Ambulatory Visit: Payer: Self-pay | Admitting: Family Medicine

## 2022-04-17 ENCOUNTER — Ambulatory Visit: Payer: Medicare HMO | Admitting: Gastroenterology

## 2022-04-17 ENCOUNTER — Other Ambulatory Visit: Payer: Self-pay | Admitting: Family Medicine

## 2022-04-18 DIAGNOSIS — R69 Illness, unspecified: Secondary | ICD-10-CM | POA: Diagnosis not present

## 2022-04-18 DIAGNOSIS — D72820 Lymphocytosis (symptomatic): Secondary | ICD-10-CM | POA: Diagnosis not present

## 2022-04-18 DIAGNOSIS — C50912 Malignant neoplasm of unspecified site of left female breast: Secondary | ICD-10-CM | POA: Diagnosis not present

## 2022-04-18 DIAGNOSIS — E611 Iron deficiency: Secondary | ICD-10-CM | POA: Diagnosis not present

## 2022-04-18 DIAGNOSIS — E118 Type 2 diabetes mellitus with unspecified complications: Secondary | ICD-10-CM | POA: Diagnosis not present

## 2022-04-18 DIAGNOSIS — Z79811 Long term (current) use of aromatase inhibitors: Secondary | ICD-10-CM | POA: Diagnosis not present

## 2022-04-18 DIAGNOSIS — D0511 Intraductal carcinoma in situ of right breast: Secondary | ICD-10-CM | POA: Diagnosis not present

## 2022-04-18 DIAGNOSIS — F39 Unspecified mood [affective] disorder: Secondary | ICD-10-CM | POA: Diagnosis not present

## 2022-04-18 DIAGNOSIS — C773 Secondary and unspecified malignant neoplasm of axilla and upper limb lymph nodes: Secondary | ICD-10-CM | POA: Diagnosis not present

## 2022-04-18 DIAGNOSIS — I10 Essential (primary) hypertension: Secondary | ICD-10-CM | POA: Diagnosis not present

## 2022-04-18 DIAGNOSIS — Z5112 Encounter for antineoplastic immunotherapy: Secondary | ICD-10-CM | POA: Diagnosis not present

## 2022-04-18 DIAGNOSIS — T451X5A Adverse effect of antineoplastic and immunosuppressive drugs, initial encounter: Secondary | ICD-10-CM | POA: Diagnosis not present

## 2022-04-18 DIAGNOSIS — C7951 Secondary malignant neoplasm of bone: Secondary | ICD-10-CM | POA: Diagnosis not present

## 2022-04-18 DIAGNOSIS — D84821 Immunodeficiency due to drugs: Secondary | ICD-10-CM | POA: Diagnosis not present

## 2022-04-18 DIAGNOSIS — C50919 Malignant neoplasm of unspecified site of unspecified female breast: Secondary | ICD-10-CM | POA: Diagnosis not present

## 2022-04-18 DIAGNOSIS — Z6837 Body mass index (BMI) 37.0-37.9, adult: Secondary | ICD-10-CM | POA: Diagnosis not present

## 2022-04-18 DIAGNOSIS — I482 Chronic atrial fibrillation, unspecified: Secondary | ICD-10-CM | POA: Diagnosis not present

## 2022-04-18 DIAGNOSIS — D701 Agranulocytosis secondary to cancer chemotherapy: Secondary | ICD-10-CM | POA: Diagnosis not present

## 2022-04-18 DIAGNOSIS — Z79899 Other long term (current) drug therapy: Secondary | ICD-10-CM | POA: Diagnosis not present

## 2022-04-24 ENCOUNTER — Other Ambulatory Visit: Payer: Self-pay | Admitting: Family Medicine

## 2022-04-28 DIAGNOSIS — C7951 Secondary malignant neoplasm of bone: Secondary | ICD-10-CM | POA: Diagnosis not present

## 2022-04-28 DIAGNOSIS — C50919 Malignant neoplasm of unspecified site of unspecified female breast: Secondary | ICD-10-CM | POA: Diagnosis not present

## 2022-04-30 ENCOUNTER — Other Ambulatory Visit: Payer: Self-pay | Admitting: Family Medicine

## 2022-04-30 DIAGNOSIS — F39 Unspecified mood [affective] disorder: Secondary | ICD-10-CM

## 2022-05-01 DIAGNOSIS — Z79899 Other long term (current) drug therapy: Secondary | ICD-10-CM | POA: Diagnosis not present

## 2022-05-01 DIAGNOSIS — R69 Illness, unspecified: Secondary | ICD-10-CM | POA: Diagnosis not present

## 2022-05-01 DIAGNOSIS — G894 Chronic pain syndrome: Secondary | ICD-10-CM | POA: Diagnosis not present

## 2022-05-05 DIAGNOSIS — G894 Chronic pain syndrome: Secondary | ICD-10-CM | POA: Diagnosis not present

## 2022-05-05 DIAGNOSIS — M62838 Other muscle spasm: Secondary | ICD-10-CM | POA: Diagnosis not present

## 2022-05-07 ENCOUNTER — Ambulatory Visit: Payer: Medicare HMO | Admitting: Gastroenterology

## 2022-05-12 ENCOUNTER — Ambulatory Visit (INDEPENDENT_AMBULATORY_CARE_PROVIDER_SITE_OTHER): Payer: Medicare HMO

## 2022-05-12 VITALS — Wt 213.0 lb

## 2022-05-12 DIAGNOSIS — Z0000 Encounter for general adult medical examination without abnormal findings: Secondary | ICD-10-CM | POA: Diagnosis not present

## 2022-05-12 NOTE — Progress Notes (Signed)
Subjective:   Diana Mathews is a 71 y.o. female who presents for Medicare Annual (Subsequent) preventive examination.  Virtual Visit via Telephone Note  I connected with  Diana Mathews on 05/12/22 at  2:45 PM EDT by telephone and verified that I am speaking with the correct person using two identifiers.  Location: Patient: Home Provider: [facility] Persons participating in the virtual visit: patient/Nurse Health Advisor   I discussed the limitations, risks, security and privacy concerns of performing an evaluation and management service by telephone and the availability of in person appointments. The patient expressed understanding and agreed to proceed.  Interactive audio and video telecommunications were attempted between this nurse and patient, however failed, due to patient having technical difficulties OR patient did not have access to video capability.  We continued and completed visit with audio only.  Some vital signs may be absent or patient reported.   Lamari Youngers E Cris Talavera, LPN   Review of Systems     Cardiac Risk Factors include: advanced age (>37mn, >>39women);diabetes mellitus;dyslipidemia;hypertension;sedentary lifestyle;obesity (BMI >30kg/m2);Other (see comment), Risk factor comments: A.Fib, h/o Stroke, breast cancer with mets     Objective:    Today's Vitals   05/12/22 1444  Weight: 213 lb (96.6 kg)   Body mass index is 37.73 kg/m.     05/12/2022    2:53 PM 05/10/2021    2:14 PM 01/11/2020   11:40 AM 01/10/2019   11:04 AM 06/30/2018    9:24 AM 06/14/2018    6:00 AM 06/12/2018    9:40 AM  Advanced Directives  Does Patient Have a Medical Advance Directive? No No No No No No No  Would patient like information on creating a medical advance directive? No - Patient declined No - Patient declined No - Patient declined No - Patient declined  No - Patient declined     Current Medications (verified) Outpatient Encounter Medications as of 05/12/2022  Medication Sig    Alcohol Swabs (B-D SINGLE USE SWABS REGULAR) PADS Test BS QID Dx E11.8   apixaban (ELIQUIS) 5 MG TABS tablet Take 1 tablet (5 mg total) by mouth 2 (two) times daily.   ARIPiprazole (ABILIFY) 5 MG tablet TAKE ONE (1) TABLET EACH DAY   atorvastatin (LIPITOR) 80 MG tablet Take 80 mg by mouth daily.   baclofen (LIORESAL) 10 MG tablet Take 10 mg by mouth 4 (four) times daily.   Blood Glucose Monitoring Suppl (TRUE METRIX AIR GLUCOSE METER) w/Device KIT Test BS QID Dx E11.8   busPIRone (BUSPAR) 10 MG tablet Take 1 tablet (10 mg total) by mouth 3 (three) times daily as needed.   carvedilol (COREG) 3.125 MG tablet TAKE 1 AND 1/2 TABLET TWICE A DAY   diltiazem (CARDIZEM CD) 240 MG 24 hr capsule Take 1 capsule (240 mg total) by mouth 2 (two) times daily.   diphenhydrAMINE (BENADRYL) 25 mg capsule Take 25 mg by mouth at bedtime as needed.   DULoxetine (CYMBALTA) 60 MG capsule TAKE 1 CAPSULE EVERY MORNING AND TAKE 1 CAPSULE AT BEDTIME   glucose blood (TRUE METRIX BLOOD GLUCOSE TEST) test strip Test BS QID Dx E11.8   HYDROcodone-acetaminophen (NORCO) 10-325 MG tablet Take 1 tablet by mouth every 6 (six) hours as needed.   letrozole (FEMARA) 2.5 MG tablet Take 2.5 mg by mouth daily.   meloxicam (MOBIC) 7.5 MG tablet Take 7.5 mg by mouth daily.   metFORMIN (GLUCOPHAGE-XR) 750 MG 24 hr tablet Take 1 tablet (750 mg total) by mouth 2 (two)  times daily.   mupirocin ointment (BACTROBAN) 2 % APPLY & COVER WITH BANDAGE TWICE DAILY   nabumetone (RELAFEN) 500 MG tablet TAKE TWO TABLETS BY MOUTH TWICE DAILY   OZEMPIC, 0.25 OR 0.5 MG/DOSE, 2 MG/3ML SOPN INJECT 0.5MG INTO THE SKIN ONCE A WEEK   palbociclib (IBRANCE) 125 MG tablet Take by mouth.   rOPINIRole (REQUIP) 1 MG tablet TAKE ONE TABLET BY MOUTH AT BEDTIME   Suvorexant (BELSOMRA) 15 MG TABS TAKE 1 TABLET AT BEDTIME FOR SLEEP   TRUEplus Lancets 33G MISC Test BS QID Dx E11.8   No facility-administered encounter medications on file as of 05/12/2022.    Allergies  (verified) Codeine   History: Past Medical History:  Diagnosis Date   Atrial fibrillation ([place]) 06/12/2018   Depression    Essential hypertension    Headache    Hyperlipidemia    Metastatic breast cancer    Left breast, metastatic to bone and with pulmonary nodules - [facility]   Osteoarthritis    Sleep apnea    TIA (transient ischemic attack)    Type 2 diabetes mellitus (HCC)    Past Surgical History:  Procedure Laterality Date   ABDOMINAL HYSTERECTOMY     CHOLECYSTECTOMY     FOOT SURGERY Left    HERNIA REPAIR     TUBAL LIGATION     WRIST SURGERY Bilateral    Carpal Tunnel   Family History  Problem Relation Age of Onset   Arthritis Mother    COPD Mother    Diabetes Mother    Hypertension Mother    Arthritis Sister    Diabetes Brother    Arthritis Sister    Diabetes Sister    Diabetes Sister    Diabetes Brother    Mental illness Brother    Social History   Socioeconomic History   Marital status: Divorced    Spouse name: Not on file   Number of children: 3   Years of education: 69   Highest education level: High school graduate  Occupational History   Occupation: Retired  Tobacco Use   Smoking status: Former    Types: Cigarettes    Quit date: 01/09/1985    Years since quitting: 37.3   Smokeless tobacco: Never  Vaping Use   Vaping Use: Never used  Substance and Sexual Activity   Alcohol use: No    Alcohol/week: 0.0 standard drinks of alcohol   Drug use: No   Sexual activity: Not Currently    Birth control/protection: Surgical  Other Topics Concern   Not on file  Social History Narrative   Lives alone. Children and family live nearby   Social Determinants of Health   Financial Resource Strain: Low Risk  (05/12/2022)   Overall Financial Resource Strain (CARDIA)    Difficulty of Paying Living Expenses: Not hard at all  Food Insecurity: No Food Insecurity (05/12/2022)   Hunger Vital Sign    Worried About Running Out of Food in the Last Year:  Never true    Ran Out of Food in the Last Year: Never true  Transportation Needs: No Transportation Needs (05/12/2022)   PRAPARE - Hydrologist (Medical): No    Lack of Transportation (Non-Medical): No  Physical Activity: Insufficiently Active (05/12/2022)   Exercise Vital Sign    Days of Exercise per Week: 7 days    Minutes of Exercise per Session: 20 min  Stress: No Stress Concern Present (05/12/2022)   [facility] -  Occupational Stress Questionnaire    Feeling of Stress : Only a little  Social Connections: Moderately Integrated (05/12/2022)   Social Connection and Isolation Panel [NHANES]    Frequency of Communication with Friends and Family: More than three times a week    Frequency of Social Gatherings with Friends and Family: More than three times a week    Attends Religious Services: More than 4 times per year    Active Member of [employer] or Organizations: Yes    Attends Music therapist: More than 4 times per year    Marital Status: Divorced    Tobacco Counseling Counseling given: Not Answered   Clinical Intake:  Pre-visit preparation completed: Yes  Pain : No/denies pain     BMI - recorded: 37.73 Nutritional Status: BMI > 30  Obese Nutritional Risks: None Diabetes: No  How often do you need to have someone help you when you read instructions, pamphlets, or other written materials from your doctor or pharmacy?: 1 - Never  Diabetic? Nutrition Risk Assessment:  Has the patient had any N/V/D within the last 2 months?  No  Does the patient have any non-healing wounds?  No  Has the patient had any unintentional weight loss or weight gain?  No   Diabetes:  Is the patient diabetic?  Yes  If diabetic, was a CBG obtained today?  No  Did the patient bring in their glucometer from home?  No  How often do you monitor your CBG's? QID.   Financial Strains and Diabetes Management:  Are you having any  financial strains with the device, your supplies or your medication? No .  Does the patient want to be seen by Chronic Care Management for management of their diabetes?  No  Would the patient like to be referred to a Nutritionist or for Diabetic Management?  No   Diabetic Exams:  Diabetic Eye Exam: Completed 2022 per patient. Overdue for diabetic eye exam. Pt has been advised about the importance in completing this exam. She will make appt soon  Diabetic Foot Exam: Completed 08/08/2021. Pt has been advised about the importance in completing this exam.   Interpreter Needed?: No  Information entered by :: Devera Englander, LPN   Activities of Daily Living    05/12/2022    2:56 PM  In your present state of health, do you have any difficulty performing the following activities:  Hearing? 0  Vision? 0  Difficulty concentrating or making decisions? 0  Walking or climbing stairs? 0  Dressing or bathing? 0  Doing errands, shopping? 0  Preparing Food and eating ? N  Using the Toilet? N  In the past six months, have you accidently leaked urine? N  Do you have problems with loss of bowel control? N  Managing your Medications? N  Managing your Finances? N  Housekeeping or managing your Housekeeping? N    Patient Care Team: Claretta Fraise, MD as PCP - General (Family Medicine) Satira Sark, MD as PCP - Cardiology (Cardiology) Verdell Carmine, MD as Referring Physician (Internal Medicine) Julian Hy, PA-C (Physician Assistant) Harlen Labs, MD as Referring Physician (Optometry)  Indicate any recent Medical Services you may have received from other than [facility] providers in the past year (date may be approximate).     Assessment:   This is a routine wellness examination for Dimitria.  Hearing/Vision screen Hearing Screening - Comments:: Denies hearing difficulties   Vision Screening - Comments:: Wears rx glasses - up to date  with routine eye exams with [facility] in  [place] issues and exercise activities discussed: Current Exercise Habits: Home exercise routine, Type of exercise: walking, Time (Minutes): 20, Frequency (Times/Week): 7, Weekly Exercise (Minutes/Week): 140, Intensity: Mild, Exercise limited by: orthopedic condition(s);cardiac condition(s)   Goals Addressed             This Visit's Progress    DIET - REDUCE CALORIE INTAKE   Not on track    Exercise 150 min/wk Moderate Activity       Stay active and independent Watch grandchildren grow up       Depression Screen    05/12/2022    2:51 PM 02/10/2022   10:17 AM 02/10/2022   10:15 AM 11/11/2021    8:14 AM 11/11/2021    8:11 AM 08/08/2021    8:29 AM 05/10/2021    2:13 PM  PHQ 2/9 Scores  PHQ - 2 Score 2 2 0 1 0 0 3  PHQ- 9 Score 3 3  1   8     Fall Risk    05/12/2022    2:46 PM 02/10/2022   10:15 AM 11/11/2021    8:11 AM 08/08/2021    8:29 AM 05/10/2021    2:15 PM  Fall Risk   Falls in the past year? 0 0 0 0 0  Number falls in past yr: 0    0  Injury with Fall? 0    0  Risk for fall due to : Orthopedic patient;Other (Comment);Medication side effect    Impaired vision;Orthopedic patient;Medication side effect  Follow up Falls prevention discussed;Education provided    Falls prevention discussed;Education provided    FALL RISK PREVENTION PERTAINING TO THE HOME:  Any stairs in or around the home? No  If so, are there any without handrails? No  Home free of loose throw rugs in walkways, pet beds, electrical cords, etc? Yes  Adequate lighting in your home to reduce risk of falls? Yes   ASSISTIVE DEVICES UTILIZED TO PREVENT FALLS:  Life alert? No  Use of a cane, walker or w/c? No  Grab bars in the bathroom? Yes  Shower chair or bench in shower? No  Elevated toilet seat or a handicapped toilet? No   TIMED UP AND GO:  Was the test performed? No . Telephonic visit  Cognitive Function:    01/10/2019   11:08 AM  MMSE - Mini Mental State Exam  Orientation to time  5  Orientation to Place 5  Registration 3  Attention/ Calculation 3  Recall 2  Language- name 2 objects 2  Language- repeat 1  Language- follow 3 step command 3  Language- read & follow direction 1  Write a sentence 1  Copy design 1  Total score 27        05/12/2022    2:54 PM 05/10/2021    2:28 PM 01/11/2020   11:40 AM  6CIT Screen  What Year? 0 points 0 points 0 points  What month? 0 points 0 points 0 points  What time? 0 points 0 points 0 points  Count back from 20 0 points 4 points 4 points  Months in reverse 4 points 4 points 4 points  Repeat phrase 0 points 0 points 0 points  Total Score 4 points 8 points 8 points    Immunizations Immunization History  Administered Date(s) Administered   Fluad Quad(high Dose 65+) 08/24/2020, 08/08/2021   Influenza, High Dose Seasonal PF 07/28/2019   Influenza,inj,Quad PF,6+ Mos 07/25/2016,  07/27/2018   Moderna SARS-COV2 Booster Vaccination 10/06/2020   Moderna Sars-Covid-2 Vaccination 01/18/2020, 02/13/2020   PNEUMOCOCCAL CONJUGATE-20 11/11/2021   Pneumococcal Conjugate-13 04/10/2020   Tdap 04/10/2020   Zoster Recombinat (Shingrix) 08/08/2021, 11/11/2021    TDAP status: Up to date  Flu Vaccine status: Up to date  Pneumococcal vaccine status: Up to date  Covid-19 vaccine status: Completed vaccines  Qualifies for Shingles Vaccine? Yes   Zostavax completed Yes   Shingrix Completed?: Yes  Screening Tests Health Maintenance  Topic Date Due   OPHTHALMOLOGY EXAM  Never done   COLONOSCOPY (Pts 45-71yr Insurance coverage will need to be confirmed)  Never done   COVID-19 Vaccine (3 - Moderna risk series) 11/03/2020   Hepatitis C Screening  08/08/2022 (Originally 10/21/1969)   INFLUENZA VACCINE  05/27/2022   FOOT EXAM  08/08/2022   HEMOGLOBIN A1C  08/12/2022   URINE MICROALBUMIN  02/11/2023   MAMMOGRAM  03/12/2023   TETANUS/TDAP  04/10/2030   Pneumonia Vaccine 71 Years old  Completed   DEXA SCAN  Completed   Zoster  Vaccines- Shingrix  Completed   HPV VACCINES  Aged Out    Health Maintenance  Health Maintenance Due  Topic Date Due   OPHTHALMOLOGY EXAM  Never done   COLONOSCOPY (Pts 45-471yrInsurance coverage will need to be confirmed)  Never done   COVID-19 Vaccine (3 - Moderna risk series) 11/03/2020    DECLINES COLONOSCOPY  MAMMOGRAM: every 6 months with NoOsborne Omanorsythn  Bone Density status: Completed Q2 years with NoValentina LucksResults reflect: Bone density results: OSTEOPENIA. Repeat every 2 years.  Lung Cancer Screening: (Low Dose CT Chest recommended if Age 69-80ears, 30 pack-year currently smoking OR have quit w/in 15years.) does not qualify.   Additional Screening:  Hepatitis C Screening: does qualify; DUE  Vision Screening: Recommended annual ophthalmology exams for early detection of glaucoma and other disorders of the eye. Is the patient up to date with their annual eye exam?  Yes  Who is the provider or what is the name of the office in which the patient attends annual eye exams? [facility] pt is not established with a provider, would they like to be referred to a provider to establish care? No .   Dental Screening: Recommended annual dental exams for proper oral hygiene  Community Resource Referral / Chronic Care Management: CRR required this visit?  No   CCM required this visit?  No      Plan:     I have personally reviewed and noted the following in the patient's chart:   Medical and social history Use of alcohol, tobacco or illicit drugs  Current medications and supplements including opioid prescriptions.  Functional ability and status Nutritional status Physical activity Advanced directives List of other physicians Hospitalizations, surgeries, and ER visits in previous 12 months Vitals Screenings to include cognitive, depression, and falls Referrals and appointments  In addition, I have reviewed and discussed with patient certain preventive protocols,  quality metrics, and best practice recommendations. A written personalized care plan for preventive services as well as general preventive health recommendations were provided to patient.     AmSandrea HammondLPN   04/30/41/7062 Nurse Notes: None

## 2022-05-12 NOTE — Patient Instructions (Signed)
[name] , Thank you for taking time to come for your Medicare Wellness Visit. I appreciate your ongoing commitment to your health goals. Please review the following plan we discussed and let me know if I can assist you in the future.   Screening recommendations/referrals: Colonoscopy: Declined Mammogram: Done every 6 months with Dr Diana Mathews at [place] with [facility] every 2 years Recommended yearly ophthalmology/optometry visit for glaucoma screening and checkup Recommended yearly dental visit for hygiene and checkup  Vaccinations: Influenza vaccine: Done 08/08/2021 - Repeat annually Pneumococcal vaccine: Done 04/10/2020 & 11/11/2021 Tdap vaccine: Done 04/10/2020 - Repeat in 10 years Shingles vaccine: Done 08/08/2021 & 11/11/2021   Covid-19: Done  01/18/2020, 02/13/2020, & 10/06/2020  Advanced directives: Advance directive discussed with you today. Even though you declined this today, please call our office should you change your mind, and we can give you the proper paperwork for you to fill out.   Conditions/risks identified: Aim for 30 minutes of exercise or brisk walking, 6-8 glasses of water, and 5 servings of fruits and vegetables each day.   Next appointment: Follow up in one year for your annual wellness visit    Preventive Care 69 Years and Older, Female Preventive care refers to lifestyle choices and visits with your health care provider that can promote health and wellness. What does preventive care include? A yearly physical exam. This is also called an annual well check. Dental exams once or twice a year. Routine eye exams. Ask your health care provider how often you should have your eyes checked. Personal lifestyle choices, including: Daily care of your teeth and gums. Regular physical activity. Eating a healthy diet. Avoiding tobacco and drug use. Limiting alcohol use. Practicing safe sex. Taking low-dose aspirin every day. Taking vitamin and  mineral supplements as recommended by your health care provider. What happens during an annual well check? The services and screenings done by your health care provider during your annual well check will depend on your age, overall health, lifestyle risk factors, and family history of disease. Counseling  Your health care provider may ask you questions about your: Alcohol use. Tobacco use. Drug use. Emotional well-being. Home and relationship well-being. Sexual activity. Eating habits. History of falls. Memory and ability to understand (cognition). Work and work Statistician. Reproductive health. Screening  You may have the following tests or measurements: Height, weight, and BMI. Blood pressure. Lipid and cholesterol levels. These may be checked every 5 years, or more frequently if you are over 36 years old. Skin check. Lung cancer screening. You may have this screening every year starting at age 69 if you have a 30-pack-year history of smoking and currently smoke or have quit within the past 15 years. Fecal occult blood test (FOBT) of the stool. You may have this test every year starting at age 69. Flexible sigmoidoscopy or colonoscopy. You may have a sigmoidoscopy every 5 years or a colonoscopy every 10 years starting at age 69. Hepatitis C blood test. Hepatitis B blood test. Sexually transmitted disease (STD) testing. Diabetes screening. This is done by checking your blood sugar (glucose) after you have not eaten for a while (fasting). You may have this done every 1-3 years. Bone density scan. This is done to screen for osteoporosis. You may have this done starting at age 69. Mammogram. This may be done every 1-2 years. Talk to your health care provider about how often you should have regular mammograms. Talk with your health care provider about your  test results, treatment options, and if necessary, the need for more tests. Vaccines  Your health care provider may recommend  certain vaccines, such as: Influenza vaccine. This is recommended every year. Tetanus, diphtheria, and acellular pertussis (Tdap, Td) vaccine. You may need a Td booster every 10 years. Zoster vaccine. You may need this after age 69. Pneumococcal 13-valent conjugate (PCV13) vaccine. One dose is recommended after age 69. Pneumococcal polysaccharide (PPSV23) vaccine. One dose is recommended after age 69. Talk to your health care provider about which screenings and vaccines you need and how often you need them. This information is not intended to replace advice given to you by your health care provider. Make sure you discuss any questions you have with your health care provider. Document Released: 11/09/2015 Document Revised: 07/02/2016 Document Reviewed: 08/14/2015 Elsevier Interactive Patient Education  2017 [place] Prevention in the Home Falls can cause injuries. They can happen to people of all ages. There are many things you can do to make your home safe and to help prevent falls. What can I do on the outside of my home? Regularly fix the edges of walkways and driveways and fix any cracks. Remove anything that might make you trip as you walk through a door, such as a raised step or threshold. Trim any bushes or trees on the path to your home. Use bright outdoor lighting. Clear any walking paths of anything that might make someone trip, such as rocks or tools. Regularly check to see if handrails are loose or broken. Make sure that both sides of any steps have handrails. Any raised decks and porches should have guardrails on the edges. Have any leaves, snow, or ice cleared regularly. Use sand or salt on walking paths during winter. Clean up any spills in your garage right away. This includes oil or grease spills. What can I do in the bathroom? Use night lights. Install grab bars by the toilet and in the tub and shower. Do not use towel bars as grab bars. Use non-skid mats or  decals in the tub or shower. If you need to sit down in the shower, use a plastic, non-slip stool. Keep the floor dry. Clean up any water that spills on the floor as soon as it happens. Remove soap buildup in the tub or shower regularly. Attach bath mats securely with double-sided non-slip rug tape. Do not have throw rugs and other things on the floor that can make you trip. What can I do in the bedroom? Use night lights. Make sure that you have a light by your bed that is easy to reach. Do not use any sheets or blankets that are too big for your bed. They should not hang down onto the floor. Have a firm chair that has side arms. You can use this for support while you get dressed. Do not have throw rugs and other things on the floor that can make you trip. What can I do in the kitchen? Clean up any spills right away. Avoid walking on wet floors. Keep items that you use a lot in easy-to-reach places. If you need to reach something above you, use a strong step stool that has a grab bar. Keep electrical cords out of the way. Do not use floor polish or wax that makes floors slippery. If you must use wax, use non-skid floor wax. Do not have throw rugs and other things on the floor that can make you trip. What can I do with my  stairs? Do not leave any items on the stairs. Make sure that there are handrails on both sides of the stairs and use them. Fix handrails that are broken or loose. Make sure that handrails are as long as the stairways. Check any carpeting to make sure that it is firmly attached to the stairs. Fix any carpet that is loose or worn. Avoid having throw rugs at the top or bottom of the stairs. If you do have throw rugs, attach them to the floor with carpet tape. Make sure that you have a light switch at the top of the stairs and the bottom of the stairs. If you do not have them, ask someone to add them for you. What else can I do to help prevent falls? Wear shoes that: Do not  have high heels. Have rubber bottoms. Are comfortable and fit you well. Are closed at the toe. Do not wear sandals. If you use a stepladder: Make sure that it is fully opened. Do not climb a closed stepladder. Make sure that both sides of the stepladder are locked into place. Ask someone to hold it for you, if possible. Clearly mark and make sure that you can see: Any grab bars or handrails. First and last steps. Where the edge of each step is. Use tools that help you move around (mobility aids) if they are needed. These include: Canes. Walkers. Scooters. Crutches. Turn on the lights when you go into a dark area. Replace any light bulbs as soon as they burn out. Set up your furniture so you have a clear path. Avoid moving your furniture around. If any of your floors are uneven, fix them. If there are any pets around you, be aware of where they are. Review your medicines with your doctor. Some medicines can make you feel dizzy. This can increase your chance of falling. Ask your doctor what other things that you can do to help prevent falls. This information is not intended to replace advice given to you by your health care provider. Make sure you discuss any questions you have with your health care provider. Document Released: 08/09/2009 Document Revised: 03/20/2016 Document Reviewed: 11/17/2014 Elsevier Interactive Patient Education  2017 [employer].

## 2022-05-14 DIAGNOSIS — C7951 Secondary malignant neoplasm of bone: Secondary | ICD-10-CM | POA: Diagnosis not present

## 2022-05-14 DIAGNOSIS — Z5112 Encounter for antineoplastic immunotherapy: Secondary | ICD-10-CM | POA: Diagnosis not present

## 2022-05-14 DIAGNOSIS — D0511 Intraductal carcinoma in situ of right breast: Secondary | ICD-10-CM | POA: Diagnosis not present

## 2022-05-14 DIAGNOSIS — C50912 Malignant neoplasm of unspecified site of left female breast: Secondary | ICD-10-CM | POA: Diagnosis not present

## 2022-05-14 DIAGNOSIS — C50919 Malignant neoplasm of unspecified site of unspecified female breast: Secondary | ICD-10-CM | POA: Diagnosis not present

## 2022-05-14 DIAGNOSIS — C773 Secondary and unspecified malignant neoplasm of axilla and upper limb lymph nodes: Secondary | ICD-10-CM | POA: Diagnosis not present

## 2022-05-14 DIAGNOSIS — E611 Iron deficiency: Secondary | ICD-10-CM | POA: Diagnosis not present

## 2022-05-15 DIAGNOSIS — I10 Essential (primary) hypertension: Secondary | ICD-10-CM | POA: Diagnosis not present

## 2022-05-15 DIAGNOSIS — R69 Illness, unspecified: Secondary | ICD-10-CM | POA: Diagnosis not present

## 2022-05-15 DIAGNOSIS — D84821 Immunodeficiency due to drugs: Secondary | ICD-10-CM | POA: Diagnosis not present

## 2022-05-15 DIAGNOSIS — E785 Hyperlipidemia, unspecified: Secondary | ICD-10-CM | POA: Diagnosis not present

## 2022-05-15 DIAGNOSIS — I4891 Unspecified atrial fibrillation: Secondary | ICD-10-CM | POA: Diagnosis not present

## 2022-05-15 DIAGNOSIS — C50919 Malignant neoplasm of unspecified site of unspecified female breast: Secondary | ICD-10-CM | POA: Diagnosis not present

## 2022-05-15 DIAGNOSIS — M1990 Unspecified osteoarthritis, unspecified site: Secondary | ICD-10-CM | POA: Diagnosis not present

## 2022-05-15 DIAGNOSIS — G2581 Restless legs syndrome: Secondary | ICD-10-CM | POA: Diagnosis not present

## 2022-05-15 DIAGNOSIS — D6869 Other thrombophilia: Secondary | ICD-10-CM | POA: Diagnosis not present

## 2022-05-15 DIAGNOSIS — G4700 Insomnia, unspecified: Secondary | ICD-10-CM | POA: Diagnosis not present

## 2022-05-15 DIAGNOSIS — E119 Type 2 diabetes mellitus without complications: Secondary | ICD-10-CM | POA: Diagnosis not present

## 2022-05-16 ENCOUNTER — Other Ambulatory Visit: Payer: Self-pay | Admitting: Family Medicine

## 2022-05-16 NOTE — Telephone Encounter (Signed)
Called pt to let her know she will have to wait until her appointment to get sleeping pills.

## 2022-05-16 NOTE — Telephone Encounter (Signed)
Last office visit 02/10/22 Last refill 11/11/21, #30, 5 refills

## 2022-05-16 NOTE — Telephone Encounter (Signed)
Please call patient to schedule appointment to refill Belsomra.

## 2022-05-22 ENCOUNTER — Ambulatory Visit (INDEPENDENT_AMBULATORY_CARE_PROVIDER_SITE_OTHER): Payer: Medicare HMO | Admitting: Family Medicine

## 2022-05-22 ENCOUNTER — Encounter: Payer: Self-pay | Admitting: Family Medicine

## 2022-05-22 VITALS — BP 131/77 | HR 95 | Temp 96.9°F | Ht 63.0 in | Wt 213.2 lb

## 2022-05-22 DIAGNOSIS — I4811 Longstanding persistent atrial fibrillation: Secondary | ICD-10-CM | POA: Diagnosis not present

## 2022-05-22 DIAGNOSIS — E785 Hyperlipidemia, unspecified: Secondary | ICD-10-CM

## 2022-05-22 DIAGNOSIS — I10 Essential (primary) hypertension: Secondary | ICD-10-CM | POA: Diagnosis not present

## 2022-05-22 DIAGNOSIS — E118 Type 2 diabetes mellitus with unspecified complications: Secondary | ICD-10-CM

## 2022-05-22 LAB — BAYER DCA HB A1C WAIVED: HB A1C (BAYER DCA - WAIVED): 6.5 % — ABNORMAL HIGH (ref 4.8–5.6)

## 2022-05-22 MED ORDER — APIXABAN 5 MG PO TABS: 5.0000 mg | ORAL_TABLET | Freq: Two times a day (BID) | ORAL | 3 refills | Status: DC

## 2022-05-22 MED ORDER — MOMETASONE FUROATE 0.1 % EX CREA: 1.0000 | TOPICAL_CREAM | Freq: Every day | CUTANEOUS | 5 refills | Status: DC

## 2022-05-22 MED ORDER — BELSOMRA 15 MG PO TABS: ORAL_TABLET | ORAL | 5 refills | Status: DC

## 2022-05-22 NOTE — Progress Notes (Addendum)
Subjective:  Patient ID: Diana Mathews,  female    DOB: 11/28/1950  Age: 71 y.o.    CC: Medical Management of Chronic Mathews   HPI Diana Mathews presents for  follow-up of hypertension. Patient has no history of headache chest pain or shortness of breath or recent cough. Patient also denies symptoms of TIA such as numbness weakness lateralizing. Patient denies side effects from medication. States taking it regularly.  Patient also  in for follow-up of elevated cholesterol. Doing well without complaints on current medication. Denies side effects  including myalgia and arthralgia and nausea. Also in today for liver function testing. Currently no chest pain, shortness of breath or other cardiovascular related symptoms noted.  Follow-up of diabetes. Patient does check blood sugar at home. Readings run between 120 and 148 Patient denies symptoms such as excessive hunger or urinary frequency, excessive hunger, nausea No significant hypoglycemic spells noted. Medications reviewed. Pt reports taking them regularly. Pt. denies complication/adverse reaction today.    History Diana Mathews has a past medical history of Atrial fibrillation ([place]) (06/12/2018), Depression, Essential hypertension, Headache, Hyperlipidemia, Metastatic breast cancer, Osteoarthritis, Sleep apnea, TIA (transient ischemic attack), and Type 2 diabetes mellitus ([place]).   She has a past surgical history that includes Abdominal hysterectomy; Wrist surgery (Bilateral); Hernia repair; Cholecystectomy; Tubal ligation; and Foot surgery (Left).   Her family history includes Arthritis in her mother, sister, and sister; COPD in her mother; Diabetes in her brother, brother, mother, sister, and sister; Hypertension in her mother; Mental illness in her brother.She reports that she quit smoking about 37 years ago. Her smoking use included cigarettes. She has never used smokeless tobacco. She reports that she does not drink alcohol and does  not use drugs.  Current Outpatient Medications on File Prior to Visit  Medication Sig Dispense Refill   Alcohol Swabs (B-D SINGLE USE SWABS REGULAR) PADS Test BS QID Dx E11.8 400 each 3   ARIPiprazole (ABILIFY) 5 MG tablet TAKE ONE (1) TABLET EACH DAY 90 tablet 0   atorvastatin (LIPITOR) 80 MG tablet Take 80 mg by mouth daily.     baclofen (LIORESAL) 10 MG tablet Take 10 mg by mouth 4 (four) times daily.     Blood Glucose Monitoring Suppl (TRUE METRIX AIR GLUCOSE METER) w/Device KIT Test BS QID Dx E11.8 1 kit 0   busPIRone (BUSPAR) 10 MG tablet Take 1 tablet (10 mg total) by mouth 3 (three) times daily as needed. 270 tablet 2   carvedilol (COREG) 3.125 MG tablet TAKE 1 AND 1/2 TABLET TWICE A DAY 270 tablet 3   diltiazem (CARDIZEM CD) 240 MG 24 hr capsule Take 1 capsule (240 mg total) by mouth 2 (two) times daily. 180 capsule 3   diphenhydrAMINE (BENADRYL) 25 mg capsule Take 25 mg by mouth at bedtime as needed.     DULoxetine (CYMBALTA) 60 MG capsule TAKE 1 CAPSULE EVERY MORNING AND TAKE 1 CAPSULE AT BEDTIME 180 capsule 0   glucose blood (TRUE METRIX BLOOD GLUCOSE TEST) test strip Test BS QID Dx E11.8 400 each 3   HYDROcodone-acetaminophen (NORCO) 10-325 MG tablet Take 1 tablet by mouth every 6 (six) hours as needed.     letrozole (FEMARA) 2.5 MG tablet Take 2.5 mg by mouth daily.     metFORMIN (GLUCOPHAGE-XR) 750 MG 24 hr tablet Take 1 tablet (750 mg total) by mouth 2 (two) times daily. 180 tablet 3   mupirocin ointment (BACTROBAN) 2 % APPLY & COVER WITH BANDAGE TWICE DAILY 22  g 0   OZEMPIC, 0.25 OR 0.5 MG/DOSE, 2 MG/3ML SOPN INJECT 0.5MG INTO THE SKIN ONCE A WEEK 9 mL 0   palbociclib (IBRANCE) 125 MG tablet Take by mouth.     rOPINIRole (REQUIP) 1 MG tablet TAKE ONE TABLET BY MOUTH AT BEDTIME 90 tablet 0   TRUEplus Lancets 33G MISC Test BS QID Dx E11.8 400 each 3   No current facility-administered medications on file prior to visit.    ROS Review of Systems  Constitutional: Negative.    HENT: Negative.    Eyes:  Negative for visual disturbance.  Respiratory:  Negative for shortness of breath.   Cardiovascular:  Negative for chest pain.  Gastrointestinal:  Negative for abdominal pain.  Musculoskeletal:  Negative for arthralgias.    Objective:  BP 131/77   Pulse 95   Temp (!) 96.9 F (36.1 C)   Ht 5' 3"  (1.6 m)   Wt 213 lb 3.2 oz (96.7 kg)   SpO2 96%   BMI 37.77 kg/m   BP Readings from Last 3 Encounters:  05/22/22 131/77  02/10/22 (!) 113/59  11/11/21 129/68    Wt Readings from Last 3 Encounters:  05/22/22 213 lb 3.2 oz (96.7 kg)  05/12/22 213 lb (96.6 kg)  02/10/22 213 lb 9.6 oz (96.9 kg)     Physical Exam Constitutional:      General: She is not in acute distress.    Appearance: She is well-developed.  Cardiovascular:     Rate and Rhythm: Normal rate and regular rhythm.  Pulmonary:     Breath sounds: Normal breath sounds.  Musculoskeletal:        General: Normal range of motion.  Skin:    General: Skin is warm and dry.  Neurological:     Mental Status: She is alert and oriented to person, place, and time.     Diabetic Foot Exam - Simple   No data filed     Lab Results  Component Value Date   HGBA1C 6.3 (H) 02/10/2022   HGBA1C 6.2 (H) 11/11/2021   HGBA1C 5.5 08/08/2021    Assessment & Plan:   Diana Mathews.  Diagnoses and all orders for this visit:  Essential hypertension -     CBC with Differential/Platelet -     CMP14+EGFR  Hyperlipidemia, unspecified hyperlipidemia type -     Lipid panel  Controlled type 2 diabetes mellitus with complication, without long-term current use of insulin (HCC) -     Bayer DCA Hb A1c Waived  Longstanding persistent atrial fibrillation (HCC) -     apixaban (ELIQUIS) 5 MG TABS tablet; Take 1 tablet (5 mg total) by mouth 2 (two) times daily.  Other orders -     Suvorexant (BELSOMRA) 15 MG TABS; TAKE 1 TABLET AT BEDTIME FOR SLEEP -      mometasone (ELOCON) 0.1 % cream; Apply 1 Application topically daily. To affected areas   I have discontinued Diana Mathews. Diana Mathews's nabumetone and meloxicam. I am also having her start on mometasone. Additionally, I am having her maintain her diphenhydrAMINE, HYDROcodone-acetaminophen, palbociclib, carvedilol, diltiazem, True Metrix Air Glucose Meter, True Metrix Blood Glucose Test, TRUEplus Lancets 33G, B-D SINGLE USE SWABS REGULAR, busPIRone, metFORMIN, mupirocin ointment, Ozempic (0.25 or 0.5 MG/DOSE), DULoxetine, ARIPiprazole, rOPINIRole, letrozole, atorvastatin, baclofen, apixaban, and Belsomra.  Meds ordered this encounter  Medications   apixaban (ELIQUIS) 5 MG TABS tablet    Sig: Take 1 tablet (5 mg total) by mouth  2 (two) times daily.    Dispense:  180 tablet    Refill:  3   Suvorexant (BELSOMRA) 15 MG TABS    Sig: TAKE 1 TABLET AT BEDTIME FOR SLEEP    Dispense:  30 tablet    Refill:  5   mometasone (ELOCON) 0.1 % cream    Sig: Apply 1 Application topically daily. To affected areas    Dispense:  45 g    Refill:  5     Follow-up: Return in about 3 months (around 08/22/2022).  Claretta Fraise, M.D.

## 2022-05-22 NOTE — Addendum Note (Signed)
Addended by: Claretta Fraise on: 05/22/2022 10:33 AM   Modules accepted: Orders

## 2022-05-23 LAB — CBC WITH DIFFERENTIAL/PLATELET
Basophils Absolute: 0.1 10*3/uL (ref 0.0–0.2)
Basos: 2 %
EOS (ABSOLUTE): 0 10*3/uL (ref 0.0–0.4)
Eos: 0 %
Hematocrit: 33.3 % — ABNORMAL LOW (ref 34.0–46.6)
Hemoglobin: 10.9 g/dL — ABNORMAL LOW (ref 11.1–15.9)
Immature Grans (Abs): 0 10*3/uL (ref 0.0–0.1)
Immature Granulocytes: 0 %
Lymphocytes Absolute: 0.9 10*3/uL (ref 0.7–3.1)
Lymphs: 36 %
MCH: 36.1 pg — ABNORMAL HIGH (ref 26.6–33.0)
MCHC: 32.7 g/dL (ref 31.5–35.7)
MCV: 110 fL — ABNORMAL HIGH (ref 79–97)
Monocytes Absolute: 0.2 10*3/uL (ref 0.1–0.9)
Monocytes: 8 %
Neutrophils Absolute: 1.3 10*3/uL — ABNORMAL LOW (ref 1.4–7.0)
Neutrophils: 54 %
Platelets: 258 10*3/uL (ref 150–450)
RBC: 3.02 x10E6/uL — ABNORMAL LOW (ref 3.77–5.28)
RDW: 17.2 % — ABNORMAL HIGH (ref 11.7–15.4)
WBC: 2.5 10*3/uL — CL (ref 3.4–10.8)

## 2022-05-23 LAB — CMP14+EGFR
ALT: 201 IU/L — ABNORMAL HIGH (ref 0–32)
AST: 354 IU/L — ABNORMAL HIGH (ref 0–40)
Albumin/Globulin Ratio: 1.4 (ref 1.2–2.2)
Albumin: 4.3 g/dL (ref 3.9–4.9)
Alkaline Phosphatase: 102 IU/L (ref 44–121)
BUN/Creatinine Ratio: 17 (ref 12–28)
BUN: 13 mg/dL (ref 8–27)
Bilirubin Total: 0.8 mg/dL (ref 0.0–1.2)
CO2: 20 mmol/L (ref 20–29)
Calcium: 9.2 mg/dL (ref 8.7–10.3)
Chloride: 104 mmol/L (ref 96–106)
Creatinine, Ser: 0.76 mg/dL (ref 0.57–1.00)
Globulin, Total: 3.1 g/dL (ref 1.5–4.5)
Glucose: 171 mg/dL — ABNORMAL HIGH (ref 70–99)
Potassium: 3.7 mmol/L (ref 3.5–5.2)
Sodium: 140 mmol/L (ref 134–144)
Total Protein: 7.4 g/dL (ref 6.0–8.5)
eGFR: 85 mL/min/{1.73_m2} (ref >59)

## 2022-05-23 LAB — LIPID PANEL
Chol/HDL Ratio: 1.5 ratio (ref 0.0–4.4)
Cholesterol, Total: 94 mg/dL — ABNORMAL LOW (ref 100–199)
HDL: 62 mg/dL (ref >39)
LDL Chol Calc (NIH): 17 mg/dL (ref 0–99)
Triglycerides: 73 mg/dL (ref 0–149)
VLDL Cholesterol Cal: 15 mg/dL (ref 5–40)

## 2022-05-26 ENCOUNTER — Other Ambulatory Visit: Payer: Self-pay | Admitting: *Deleted

## 2022-05-26 DIAGNOSIS — R748 Abnormal levels of other serum enzymes: Secondary | ICD-10-CM

## 2022-05-26 DIAGNOSIS — R7989 Other specified abnormal findings of blood chemistry: Secondary | ICD-10-CM

## 2022-06-02 DIAGNOSIS — G4700 Insomnia, unspecified: Secondary | ICD-10-CM | POA: Diagnosis not present

## 2022-06-02 DIAGNOSIS — Z6838 Body mass index (BMI) 38.0-38.9, adult: Secondary | ICD-10-CM | POA: Diagnosis not present

## 2022-06-02 DIAGNOSIS — G8929 Other chronic pain: Secondary | ICD-10-CM | POA: Diagnosis not present

## 2022-06-02 DIAGNOSIS — Z79899 Other long term (current) drug therapy: Secondary | ICD-10-CM | POA: Diagnosis not present

## 2022-06-02 DIAGNOSIS — R69 Illness, unspecified: Secondary | ICD-10-CM | POA: Diagnosis not present

## 2022-06-02 DIAGNOSIS — M545 Low back pain, unspecified: Secondary | ICD-10-CM | POA: Diagnosis not present

## 2022-06-02 DIAGNOSIS — Z9181 History of falling: Secondary | ICD-10-CM | POA: Diagnosis not present

## 2022-06-02 DIAGNOSIS — C50912 Malignant neoplasm of unspecified site of left female breast: Secondary | ICD-10-CM | POA: Diagnosis not present

## 2022-06-02 DIAGNOSIS — E119 Type 2 diabetes mellitus without complications: Secondary | ICD-10-CM | POA: Diagnosis not present

## 2022-06-02 DIAGNOSIS — E7800 Pure hypercholesterolemia, unspecified: Secondary | ICD-10-CM | POA: Diagnosis not present

## 2022-06-05 DIAGNOSIS — M62838 Other muscle spasm: Secondary | ICD-10-CM | POA: Diagnosis not present

## 2022-06-05 DIAGNOSIS — G894 Chronic pain syndrome: Secondary | ICD-10-CM | POA: Diagnosis not present

## 2022-06-10 DIAGNOSIS — E611 Iron deficiency: Secondary | ICD-10-CM | POA: Diagnosis not present

## 2022-06-10 DIAGNOSIS — C7951 Secondary malignant neoplasm of bone: Secondary | ICD-10-CM | POA: Diagnosis not present

## 2022-06-10 DIAGNOSIS — C50919 Malignant neoplasm of unspecified site of unspecified female breast: Secondary | ICD-10-CM | POA: Diagnosis not present

## 2022-06-12 DIAGNOSIS — E611 Iron deficiency: Secondary | ICD-10-CM | POA: Diagnosis not present

## 2022-06-12 DIAGNOSIS — D0511 Intraductal carcinoma in situ of right breast: Secondary | ICD-10-CM | POA: Diagnosis not present

## 2022-06-12 DIAGNOSIS — C50912 Malignant neoplasm of unspecified site of left female breast: Secondary | ICD-10-CM | POA: Diagnosis not present

## 2022-06-12 DIAGNOSIS — C773 Secondary and unspecified malignant neoplasm of axilla and upper limb lymph nodes: Secondary | ICD-10-CM | POA: Diagnosis not present

## 2022-06-12 DIAGNOSIS — C7951 Secondary malignant neoplasm of bone: Secondary | ICD-10-CM | POA: Diagnosis not present

## 2022-06-17 DIAGNOSIS — E611 Iron deficiency: Secondary | ICD-10-CM | POA: Diagnosis not present

## 2022-06-30 DIAGNOSIS — K122 Cellulitis and abscess of mouth: Secondary | ICD-10-CM | POA: Diagnosis not present

## 2022-06-30 DIAGNOSIS — M791 Myalgia, unspecified site: Secondary | ICD-10-CM | POA: Diagnosis not present

## 2022-07-04 DIAGNOSIS — Z9181 History of falling: Secondary | ICD-10-CM | POA: Diagnosis not present

## 2022-07-04 DIAGNOSIS — R030 Elevated blood-pressure reading, without diagnosis of hypertension: Secondary | ICD-10-CM | POA: Diagnosis not present

## 2022-07-04 DIAGNOSIS — M545 Low back pain, unspecified: Secondary | ICD-10-CM | POA: Diagnosis not present

## 2022-07-04 DIAGNOSIS — Z6838 Body mass index (BMI) 38.0-38.9, adult: Secondary | ICD-10-CM | POA: Diagnosis not present

## 2022-07-04 DIAGNOSIS — R69 Illness, unspecified: Secondary | ICD-10-CM | POA: Diagnosis not present

## 2022-07-04 DIAGNOSIS — G4700 Insomnia, unspecified: Secondary | ICD-10-CM | POA: Diagnosis not present

## 2022-07-04 DIAGNOSIS — C50912 Malignant neoplasm of unspecified site of left female breast: Secondary | ICD-10-CM | POA: Diagnosis not present

## 2022-07-04 DIAGNOSIS — Z79899 Other long term (current) drug therapy: Secondary | ICD-10-CM | POA: Diagnosis not present

## 2022-07-04 DIAGNOSIS — G8929 Other chronic pain: Secondary | ICD-10-CM | POA: Diagnosis not present

## 2022-07-06 DIAGNOSIS — G894 Chronic pain syndrome: Secondary | ICD-10-CM | POA: Diagnosis not present

## 2022-07-06 DIAGNOSIS — M62838 Other muscle spasm: Secondary | ICD-10-CM | POA: Diagnosis not present

## 2022-07-10 DIAGNOSIS — C50919 Malignant neoplasm of unspecified site of unspecified female breast: Secondary | ICD-10-CM | POA: Diagnosis not present

## 2022-07-10 DIAGNOSIS — E611 Iron deficiency: Secondary | ICD-10-CM | POA: Diagnosis not present

## 2022-07-10 DIAGNOSIS — D0511 Intraductal carcinoma in situ of right breast: Secondary | ICD-10-CM | POA: Diagnosis not present

## 2022-07-10 DIAGNOSIS — C50912 Malignant neoplasm of unspecified site of left female breast: Secondary | ICD-10-CM | POA: Diagnosis not present

## 2022-07-10 DIAGNOSIS — D701 Agranulocytosis secondary to cancer chemotherapy: Secondary | ICD-10-CM | POA: Diagnosis not present

## 2022-07-10 DIAGNOSIS — C7951 Secondary malignant neoplasm of bone: Secondary | ICD-10-CM | POA: Diagnosis not present

## 2022-07-10 DIAGNOSIS — T451X5A Adverse effect of antineoplastic and immunosuppressive drugs, initial encounter: Secondary | ICD-10-CM | POA: Diagnosis not present

## 2022-07-10 DIAGNOSIS — C773 Secondary and unspecified malignant neoplasm of axilla and upper limb lymph nodes: Secondary | ICD-10-CM | POA: Diagnosis not present

## 2022-07-15 ENCOUNTER — Other Ambulatory Visit: Payer: Self-pay | Admitting: Family Medicine

## 2022-07-21 ENCOUNTER — Other Ambulatory Visit: Payer: Self-pay | Admitting: Family Medicine

## 2022-07-21 NOTE — Telephone Encounter (Signed)
Last office visit 05/22/22 Medication is on med list but does not show as prescribed by you

## 2022-07-31 ENCOUNTER — Other Ambulatory Visit: Payer: Self-pay | Admitting: Family Medicine

## 2022-07-31 DIAGNOSIS — E118 Type 2 diabetes mellitus with unspecified complications: Secondary | ICD-10-CM

## 2022-08-01 DIAGNOSIS — R69 Illness, unspecified: Secondary | ICD-10-CM | POA: Diagnosis not present

## 2022-08-01 DIAGNOSIS — Z6836 Body mass index (BMI) 36.0-36.9, adult: Secondary | ICD-10-CM | POA: Diagnosis not present

## 2022-08-01 DIAGNOSIS — Z9181 History of falling: Secondary | ICD-10-CM | POA: Diagnosis not present

## 2022-08-01 DIAGNOSIS — R7309 Other abnormal glucose: Secondary | ICD-10-CM | POA: Diagnosis not present

## 2022-08-01 DIAGNOSIS — G4700 Insomnia, unspecified: Secondary | ICD-10-CM | POA: Diagnosis not present

## 2022-08-01 DIAGNOSIS — Z6838 Body mass index (BMI) 38.0-38.9, adult: Secondary | ICD-10-CM | POA: Diagnosis not present

## 2022-08-01 DIAGNOSIS — Z6837 Body mass index (BMI) 37.0-37.9, adult: Secondary | ICD-10-CM | POA: Diagnosis not present

## 2022-08-01 DIAGNOSIS — R030 Elevated blood-pressure reading, without diagnosis of hypertension: Secondary | ICD-10-CM | POA: Diagnosis not present

## 2022-08-01 DIAGNOSIS — M545 Low back pain, unspecified: Secondary | ICD-10-CM | POA: Diagnosis not present

## 2022-08-01 DIAGNOSIS — G8929 Other chronic pain: Secondary | ICD-10-CM | POA: Diagnosis not present

## 2022-08-01 DIAGNOSIS — C50912 Malignant neoplasm of unspecified site of left female breast: Secondary | ICD-10-CM | POA: Diagnosis not present

## 2022-08-05 DIAGNOSIS — M62838 Other muscle spasm: Secondary | ICD-10-CM | POA: Diagnosis not present

## 2022-08-05 DIAGNOSIS — G894 Chronic pain syndrome: Secondary | ICD-10-CM | POA: Diagnosis not present

## 2022-08-07 DIAGNOSIS — C50919 Malignant neoplasm of unspecified site of unspecified female breast: Secondary | ICD-10-CM | POA: Diagnosis not present

## 2022-08-07 DIAGNOSIS — D0511 Intraductal carcinoma in situ of right breast: Secondary | ICD-10-CM | POA: Diagnosis not present

## 2022-08-07 DIAGNOSIS — E611 Iron deficiency: Secondary | ICD-10-CM | POA: Diagnosis not present

## 2022-08-07 DIAGNOSIS — D701 Agranulocytosis secondary to cancer chemotherapy: Secondary | ICD-10-CM | POA: Diagnosis not present

## 2022-08-07 DIAGNOSIS — C50912 Malignant neoplasm of unspecified site of left female breast: Secondary | ICD-10-CM | POA: Diagnosis not present

## 2022-08-07 DIAGNOSIS — I10 Essential (primary) hypertension: Secondary | ICD-10-CM | POA: Diagnosis not present

## 2022-08-07 DIAGNOSIS — C773 Secondary and unspecified malignant neoplasm of axilla and upper limb lymph nodes: Secondary | ICD-10-CM | POA: Diagnosis not present

## 2022-08-07 DIAGNOSIS — C7951 Secondary malignant neoplasm of bone: Secondary | ICD-10-CM | POA: Diagnosis not present

## 2022-08-07 DIAGNOSIS — T451X5A Adverse effect of antineoplastic and immunosuppressive drugs, initial encounter: Secondary | ICD-10-CM | POA: Diagnosis not present

## 2022-08-11 ENCOUNTER — Other Ambulatory Visit: Payer: Self-pay | Admitting: Family Medicine

## 2022-08-25 ENCOUNTER — Ambulatory Visit (INDEPENDENT_AMBULATORY_CARE_PROVIDER_SITE_OTHER): Payer: Medicare HMO | Admitting: Family Medicine

## 2022-08-25 ENCOUNTER — Encounter: Payer: Self-pay | Admitting: Family Medicine

## 2022-08-25 VITALS — BP 125/60 | HR 136 | Temp 97.0°F | Ht 63.0 in | Wt 205.8 lb

## 2022-08-25 DIAGNOSIS — R000 Tachycardia, unspecified: Secondary | ICD-10-CM

## 2022-08-25 DIAGNOSIS — Z79811 Long term (current) use of aromatase inhibitors: Secondary | ICD-10-CM | POA: Diagnosis not present

## 2022-08-25 DIAGNOSIS — F39 Unspecified mood [affective] disorder: Secondary | ICD-10-CM

## 2022-08-25 DIAGNOSIS — Z792 Long term (current) use of antibiotics: Secondary | ICD-10-CM | POA: Diagnosis not present

## 2022-08-25 DIAGNOSIS — I471 Supraventricular tachycardia, unspecified: Secondary | ICD-10-CM | POA: Diagnosis not present

## 2022-08-25 DIAGNOSIS — Z885 Allergy status to narcotic agent status: Secondary | ICD-10-CM | POA: Diagnosis not present

## 2022-08-25 DIAGNOSIS — E118 Type 2 diabetes mellitus with unspecified complications: Secondary | ICD-10-CM

## 2022-08-25 DIAGNOSIS — E0590 Thyrotoxicosis, unspecified without thyrotoxic crisis or storm: Secondary | ICD-10-CM | POA: Diagnosis not present

## 2022-08-25 DIAGNOSIS — R69 Illness, unspecified: Secondary | ICD-10-CM | POA: Diagnosis not present

## 2022-08-25 DIAGNOSIS — Z9049 Acquired absence of other specified parts of digestive tract: Secondary | ICD-10-CM | POA: Diagnosis not present

## 2022-08-25 DIAGNOSIS — R112 Nausea with vomiting, unspecified: Secondary | ICD-10-CM | POA: Diagnosis not present

## 2022-08-25 DIAGNOSIS — Z7984 Long term (current) use of oral hypoglycemic drugs: Secondary | ICD-10-CM | POA: Diagnosis not present

## 2022-08-25 DIAGNOSIS — Z79899 Other long term (current) drug therapy: Secondary | ICD-10-CM | POA: Diagnosis not present

## 2022-08-25 DIAGNOSIS — I10 Essential (primary) hypertension: Secondary | ICD-10-CM | POA: Diagnosis not present

## 2022-08-25 DIAGNOSIS — I4811 Longstanding persistent atrial fibrillation: Secondary | ICD-10-CM | POA: Diagnosis not present

## 2022-08-25 DIAGNOSIS — Z23 Encounter for immunization: Secondary | ICD-10-CM | POA: Diagnosis not present

## 2022-08-25 DIAGNOSIS — E785 Hyperlipidemia, unspecified: Secondary | ICD-10-CM | POA: Diagnosis not present

## 2022-08-25 DIAGNOSIS — E119 Type 2 diabetes mellitus without complications: Secondary | ICD-10-CM | POA: Diagnosis not present

## 2022-08-25 DIAGNOSIS — Z7901 Long term (current) use of anticoagulants: Secondary | ICD-10-CM | POA: Diagnosis not present

## 2022-08-25 DIAGNOSIS — R778 Other specified abnormalities of plasma proteins: Secondary | ICD-10-CM | POA: Diagnosis not present

## 2022-08-25 LAB — BAYER DCA HB A1C WAIVED: HB A1C (BAYER DCA - WAIVED): 6 % — ABNORMAL HIGH (ref 4.8–5.6)

## 2022-08-25 MED ORDER — DILTIAZEM HCL ER COATED BEADS 240 MG PO CP24: 240.0000 mg | ORAL_CAPSULE | Freq: Two times a day (BID) | ORAL | 3 refills | Status: DC

## 2022-08-25 MED ORDER — ROPINIROLE HCL 1 MG PO TABS: 1.0000 mg | ORAL_TABLET | Freq: Every day | ORAL | 3 refills | Status: DC

## 2022-08-25 MED ORDER — CARVEDILOL 3.125 MG PO TABS: ORAL_TABLET | ORAL | 3 refills | Status: DC

## 2022-08-25 MED ORDER — BUSPIRONE HCL 10 MG PO TABS: 10.0000 mg | ORAL_TABLET | Freq: Three times a day (TID) | ORAL | 3 refills | Status: DC | PRN

## 2022-08-25 MED ORDER — DULOXETINE HCL 60 MG PO CPEP: ORAL_CAPSULE | ORAL | 3 refills | Status: DC

## 2022-08-25 MED ORDER — ATORVASTATIN CALCIUM 80 MG PO TABS: ORAL_TABLET | ORAL | 2 refills | Status: DC

## 2022-08-25 MED ORDER — ARIPIPRAZOLE 5 MG PO TABS: ORAL_TABLET | ORAL | 3 refills | Status: DC

## 2022-08-25 NOTE — Progress Notes (Signed)
Subjective:  Patient ID: Diana Mathews, female    DOB: 1950-12-22  Age: 71 y.o. MRN: [id number]  CC: Medical Management of Chronic Issues   HPI PERLA ECHAVARRIA presents for regullar follow up. Having dizzy spells at night. Doesn't feel good today. Was hard for her to get here. Denies chest pain and dyspnea. Doesn't feel palpitations  Feels bad, but not dizzy right now. No nausea or vomiting.    presents forFollow-up of diabetes. Patient checks blood sugar at home.  Patient denies symptoms such as polyuria, polydipsia, excessive hunger, nausea No significant hypoglycemic spells noted. Medications reviewed. Pt reports taking them regularly without complication/adverse reaction being reported today.    Lab Results  Component Value Date   HGBA1C 6.0 (H) 08/25/2022   HGBA1C 6.5 (H) 05/22/2022   HGBA1C 6.3 (H) 02/10/2022         08/25/2022   10:32 AM 08/25/2022   10:27 AM 05/22/2022    9:54 AM  Depression screen PHQ 2/9  Decreased Interest 1 0 1  Down, Depressed, Hopeless 1 0 1  PHQ - 2 Score 2 0 2  Altered sleeping 2  2  Tired, decreased energy 2  2  Change in appetite 1  1  Feeling bad or failure about yourself  1  1  Trouble concentrating 1  1  Moving slowly or fidgety/restless 1  0  Suicidal thoughts 1  0  PHQ-9 Score 11  9  Difficult doing work/chores Somewhat difficult  Somewhat difficult    History Belinda has a past medical history of Atrial fibrillation ([place]) (06/12/2018), Depression, Essential hypertension, Headache, Hyperlipidemia, Metastatic breast cancer, Osteoarthritis, Sleep apnea, TIA (transient ischemic attack), and Type 2 diabetes mellitus ([place]).   She has a past surgical history that includes Abdominal hysterectomy; Wrist surgery (Bilateral); Hernia repair; Cholecystectomy; Tubal ligation; and Foot surgery (Left).   Her family history includes Arthritis in her mother, sister, and sister; COPD in her mother; Diabetes in her brother, brother, mother,  sister, and sister; Hypertension in her mother; Mental illness in her brother.She reports that she quit smoking about 37 years ago. Her smoking use included cigarettes. She has never used smokeless tobacco. She reports that she does not drink alcohol and does not use drugs.    ROS Review of Systems  Unable to perform ROS: Acuity of condition  Respiratory:  Negative for cough, chest tightness, shortness of breath and wheezing.   Cardiovascular:  Negative for chest pain.    Objective:  BP 125/60   Pulse (!) 136   Temp (!) 97 F (36.1 C)   Ht 5' 3" (1.6 m)   Wt 205 lb 12.8 oz (93.4 kg)   SpO2 98%   BMI 36.46 kg/m   BP Readings from Last 3 Encounters:  08/25/22 125/60  05/22/22 131/77  02/10/22 (!) 113/59    Wt Readings from Last 3 Encounters:  08/25/22 205 lb 12.8 oz (93.4 kg)  05/22/22 213 lb 3.2 oz (96.7 kg)  05/12/22 213 lb (96.6 kg)     Physical Exam Constitutional:      General: She is not in acute distress.    Appearance: She is well-developed.  Cardiovascular:     Rate and Rhythm: Regular rhythm. Tachycardia present.     Heart sounds: No murmur heard.    Comments: Ht. Rate is 180. Frequent irregular beats. Pulmonary:     Breath sounds: Normal breath sounds.  Musculoskeletal:        General: Normal range of  motion.  Skin:    General: Skin is warm and dry.     Coloration: Skin is pale.     Findings: No lesion or rash.  Neurological:     Mental Status: She is alert and oriented to person, place, and time.    EKG - HR 171. Anterolateral ST depressions.    Assessment & Plan:   Aldona was seen today for medical management of chronic issues.  Diagnoses and all orders for this visit:  Controlled type 2 diabetes mellitus with complication, without long-term current use of insulin (HCC) -     Bayer DCA Hb A1c Waived  Essential hypertension -     CBC with Differential/Platelet -     CMP14+EGFR  Hyperlipidemia, unspecified hyperlipidemia type -     Lipid  panel  Mood disorder (HCC) -     ARIPiprazole (ABILIFY) 5 MG tablet; TAKE ONE (1) TABLET EACH DAY  Longstanding persistent atrial fibrillation (HCC) -     carvedilol (COREG) 3.125 MG tablet; TAKE 1 AND 1/2 TABLET TWICE A DAY -     diltiazem (CARDIZEM CD) 240 MG 24 hr capsule; Take 1 capsule (240 mg total) by mouth 2 (two) times daily.  Elevated pulse rate -     EKG 12-Lead  Need for immunization against influenza -     Flu Vaccine QUAD High Dose(Fluad)  Other orders -     atorvastatin (LIPITOR) 80 MG tablet; TAKE ONE (1) TABLET EACH DAY -     busPIRone (BUSPAR) 10 MG tablet; Take 1 tablet (10 mg total) by mouth 3 (three) times daily as needed. -     DULoxetine (CYMBALTA) 60 MG capsule; TAKE 1 CAPSULE EVERY MORNING AND TAKE 1 CAPSULE AT BEDTIME -     rOPINIRole (REQUIP) 1 MG tablet; Take 1 tablet (1 mg total) by mouth at bedtime.       I have changed Osker Mason. Rodman's rOPINIRole. I am also having her maintain her diphenhydrAMINE, HYDROcodone-acetaminophen, palbociclib, True Metrix Air Glucose Meter, True Metrix Blood Glucose Test, TRUEplus Lancets 33G, B-D SINGLE USE SWABS REGULAR, mupirocin ointment, letrozole, baclofen, apixaban, Belsomra, mometasone, Ozempic (0.25 or 0.5 MG/DOSE), metFORMIN, ARIPiprazole, atorvastatin, busPIRone, carvedilol, diltiazem, and DULoxetine.  Allergies as of 08/25/2022       Reactions   Codeine Rash        Medication List        Accurate as of August 25, 2022  9:28 PM. If you have any questions, ask your nurse or doctor.          apixaban 5 MG Tabs tablet Commonly known as: Eliquis Take 1 tablet (5 mg total) by mouth 2 (two) times daily.   ARIPiprazole 5 MG tablet Commonly known as: ABILIFY TAKE ONE (1) TABLET EACH DAY   atorvastatin 80 MG tablet Commonly known as: LIPITOR TAKE ONE (1) TABLET EACH DAY   B-D SINGLE USE SWABS REGULAR Pads Test BS QID Dx E11.8   baclofen 10 MG tablet Commonly known as: LIORESAL Take 10 mg by  mouth 4 (four) times daily.   Belsomra 15 MG Tabs Generic drug: Suvorexant TAKE 1 TABLET AT BEDTIME FOR SLEEP   busPIRone 10 MG tablet Commonly known as: BUSPAR Take 1 tablet (10 mg total) by mouth 3 (three) times daily as needed.   carvedilol 3.125 MG tablet Commonly known as: COREG TAKE 1 AND 1/2 TABLET TWICE A DAY   diltiazem 240 MG 24 hr capsule Commonly known as: CARDIZEM CD Take 1 capsule (240  mg total) by mouth 2 (two) times daily.   diphenhydrAMINE 25 mg capsule Commonly known as: BENADRYL Take 25 mg by mouth at bedtime as needed.   DULoxetine 60 MG capsule Commonly known as: CYMBALTA TAKE 1 CAPSULE EVERY MORNING AND TAKE 1 CAPSULE AT BEDTIME   HYDROcodone-acetaminophen 10-325 MG tablet Commonly known as: NORCO Take 1 tablet by mouth every 6 (six) hours as needed.   letrozole 2.5 MG tablet Commonly known as: FEMARA Take 2.5 mg by mouth daily.   metFORMIN 750 MG 24 hr tablet Commonly known as: GLUCOPHAGE-XR TAKE ONE (1) TABLET BY MOUTH TWO (2) TIMES DAILY   mometasone 0.1 % cream Commonly known as: Elocon Apply 1 Application topically daily. To affected areas   mupirocin ointment 2 % Commonly known as: BACTROBAN APPLY & COVER WITH BANDAGE TWICE DAILY   Ozempic (0.25 or 0.5 MG/DOSE) 2 MG/3ML Sopn Generic drug: Semaglutide(0.25 or 0.5MG/DOS) INJECT 0.5MG INTO THE SKIN ONCE A WEEK   palbociclib 125 MG tablet Commonly known as: IBRANCE Take by mouth.   rOPINIRole 1 MG tablet Commonly known as: REQUIP Take 1 tablet (1 mg total) by mouth at bedtime.   True Metrix Air Glucose Meter w/Device Kit Test BS QID Dx E11.8   True Metrix Blood Glucose Test test strip Generic drug: glucose blood Test BS QID Dx E11.8   TRUEplus Lancets 33G Misc Test BS QID Dx E11.8         Follow-up: Return in about 1 week (around 09/01/2022) for diabetes.  Claretta Fraise, M.D.

## 2022-08-27 LAB — CBC WITH DIFFERENTIAL/PLATELET
Basophils Absolute: 0 10*3/uL (ref 0.0–0.2)
Basos: 1 %
EOS (ABSOLUTE): 0.1 10*3/uL (ref 0.0–0.4)
Eos: 1 %
Hematocrit: 33.4 % — ABNORMAL LOW (ref 34.0–46.6)
Hemoglobin: 11.3 g/dL (ref 11.1–15.9)
Immature Grans (Abs): 0 10*3/uL (ref 0.0–0.1)
Immature Granulocytes: 1 %
Lymphocytes Absolute: 1.9 10*3/uL (ref 0.7–3.1)
Lymphs: 39 %
MCH: 38.3 pg — ABNORMAL HIGH (ref 26.6–33.0)
MCHC: 33.8 g/dL (ref 31.5–35.7)
MCV: 113 fL — ABNORMAL HIGH (ref 79–97)
Monocytes Absolute: 0.1 10*3/uL (ref 0.1–0.9)
Monocytes: 3 %
Neutrophils Absolute: 2.7 10*3/uL (ref 1.4–7.0)
Neutrophils: 55 %
Platelets: 291 10*3/uL (ref 150–450)
RBC: 2.95 x10E6/uL — ABNORMAL LOW (ref 3.77–5.28)
RDW: 14.8 % (ref 11.7–15.4)
WBC: 4.9 10*3/uL (ref 3.4–10.8)

## 2022-08-27 LAB — CMP14+EGFR
ALT: 26 IU/L (ref 0–32)
AST: 29 IU/L (ref 0–40)
Albumin/Globulin Ratio: 1.4 (ref 1.2–2.2)
Albumin: 3.8 g/dL — ABNORMAL LOW (ref 3.9–4.9)
Alkaline Phosphatase: 122 IU/L — ABNORMAL HIGH (ref 44–121)
BUN/Creatinine Ratio: 12 (ref 12–28)
BUN: 10 mg/dL (ref 8–27)
Bilirubin Total: 0.5 mg/dL (ref 0.0–1.2)
CO2: 19 mmol/L — ABNORMAL LOW (ref 20–29)
Calcium: 9.1 mg/dL (ref 8.7–10.3)
Chloride: 102 mmol/L (ref 96–106)
Creatinine, Ser: 0.85 mg/dL (ref 0.57–1.00)
Globulin, Total: 2.8 g/dL (ref 1.5–4.5)
Glucose: 198 mg/dL — ABNORMAL HIGH (ref 70–99)
Potassium: 3.7 mmol/L (ref 3.5–5.2)
Sodium: 144 mmol/L (ref 134–144)
Total Protein: 6.6 g/dL (ref 6.0–8.5)
eGFR: 75 mL/min/{1.73_m2} (ref >59)

## 2022-08-27 LAB — LIPID PANEL
Chol/HDL Ratio: 1.8 ratio (ref 0.0–4.4)
Cholesterol, Total: 77 mg/dL — ABNORMAL LOW (ref 100–199)
HDL: 44 mg/dL (ref >39)
LDL Chol Calc (NIH): 7 mg/dL (ref 0–99)
Triglycerides: 156 mg/dL — ABNORMAL HIGH (ref 0–149)
VLDL Cholesterol Cal: 26 mg/dL (ref 5–40)

## 2022-09-01 DIAGNOSIS — G8929 Other chronic pain: Secondary | ICD-10-CM | POA: Diagnosis not present

## 2022-09-01 DIAGNOSIS — Z6837 Body mass index (BMI) 37.0-37.9, adult: Secondary | ICD-10-CM | POA: Diagnosis not present

## 2022-09-01 DIAGNOSIS — M545 Low back pain, unspecified: Secondary | ICD-10-CM | POA: Diagnosis not present

## 2022-09-01 DIAGNOSIS — G4700 Insomnia, unspecified: Secondary | ICD-10-CM | POA: Diagnosis not present

## 2022-09-01 DIAGNOSIS — Z6836 Body mass index (BMI) 36.0-36.9, adult: Secondary | ICD-10-CM | POA: Diagnosis not present

## 2022-09-01 DIAGNOSIS — R69 Illness, unspecified: Secondary | ICD-10-CM | POA: Diagnosis not present

## 2022-09-01 DIAGNOSIS — C50912 Malignant neoplasm of unspecified site of left female breast: Secondary | ICD-10-CM | POA: Diagnosis not present

## 2022-09-01 DIAGNOSIS — R030 Elevated blood-pressure reading, without diagnosis of hypertension: Secondary | ICD-10-CM | POA: Diagnosis not present

## 2022-09-01 DIAGNOSIS — Z9181 History of falling: Secondary | ICD-10-CM | POA: Diagnosis not present

## 2022-09-01 DIAGNOSIS — L981 Factitial dermatitis: Secondary | ICD-10-CM | POA: Diagnosis not present

## 2022-09-01 DIAGNOSIS — R7309 Other abnormal glucose: Secondary | ICD-10-CM | POA: Diagnosis not present

## 2022-09-01 DIAGNOSIS — Z6838 Body mass index (BMI) 38.0-38.9, adult: Secondary | ICD-10-CM | POA: Diagnosis not present

## 2022-09-05 DIAGNOSIS — G894 Chronic pain syndrome: Secondary | ICD-10-CM | POA: Diagnosis not present

## 2022-09-05 DIAGNOSIS — M62838 Other muscle spasm: Secondary | ICD-10-CM | POA: Diagnosis not present

## 2022-09-16 ENCOUNTER — Encounter: Payer: Self-pay | Admitting: Family Medicine

## 2022-09-16 ENCOUNTER — Ambulatory Visit (INDEPENDENT_AMBULATORY_CARE_PROVIDER_SITE_OTHER): Payer: Medicare HMO | Admitting: Family Medicine

## 2022-09-16 VITALS — BP 157/81 | HR 114 | Temp 96.8°F | Ht 63.0 in | Wt 214.8 lb

## 2022-09-16 DIAGNOSIS — C50912 Malignant neoplasm of unspecified site of left female breast: Secondary | ICD-10-CM | POA: Diagnosis not present

## 2022-09-16 DIAGNOSIS — I10 Essential (primary) hypertension: Secondary | ICD-10-CM | POA: Diagnosis not present

## 2022-09-16 DIAGNOSIS — I4811 Longstanding persistent atrial fibrillation: Secondary | ICD-10-CM

## 2022-09-16 DIAGNOSIS — C7951 Secondary malignant neoplasm of bone: Secondary | ICD-10-CM | POA: Diagnosis not present

## 2022-09-16 DIAGNOSIS — C50919 Malignant neoplasm of unspecified site of unspecified female breast: Secondary | ICD-10-CM | POA: Diagnosis not present

## 2022-09-16 DIAGNOSIS — C50911 Malignant neoplasm of unspecified site of right female breast: Secondary | ICD-10-CM | POA: Diagnosis not present

## 2022-09-16 DIAGNOSIS — E611 Iron deficiency: Secondary | ICD-10-CM | POA: Diagnosis not present

## 2022-09-16 DIAGNOSIS — C773 Secondary and unspecified malignant neoplasm of axilla and upper limb lymph nodes: Secondary | ICD-10-CM | POA: Diagnosis not present

## 2022-09-16 DIAGNOSIS — L989 Disorder of the skin and subcutaneous tissue, unspecified: Secondary | ICD-10-CM | POA: Diagnosis not present

## 2022-09-16 MED ORDER — CARVEDILOL 6.25 MG PO TABS: 6.2500 mg | ORAL_TABLET | Freq: Two times a day (BID) | ORAL | 3 refills | Status: DC

## 2022-09-16 NOTE — Progress Notes (Signed)
Subjective:  Patient ID: Diana Mathews, female    DOB: 31-Jan-1951  Age: 71 y.o. MRN: [id number]  CC: ER FOLLOW UP   HPI NATAKI MCCRUMB presents for A fib sped up last month. Went to E.D. carvedilol inceased. Noted to have elevated T4 and low TSH     09/16/2022    9:23 AM 08/25/2022   10:32 AM 08/25/2022   10:27 AM  Depression screen PHQ 2/9  Decreased Interest 1 1 0  Down, Depressed, Hopeless 1 1 0  PHQ - 2 Score 2 2 0  Altered sleeping 1 2   Tired, decreased energy 1 2   Change in appetite 1 1   Feeling bad or failure about yourself  0 1   Trouble concentrating 0 1   Moving slowly or fidgety/restless 0 1   Suicidal thoughts 0 1   PHQ-9 Score 5 11   Difficult doing work/chores Somewhat difficult Somewhat difficult     History Mekenna has a past medical history of Atrial fibrillation ([place]) (06/12/2018), Depression, Essential hypertension, Headache, Hyperlipidemia, Metastatic breast cancer, Osteoarthritis, Sleep apnea, TIA (transient ischemic attack), and Type 2 diabetes mellitus ([place]).   She has a past surgical history that includes Abdominal hysterectomy; Wrist surgery (Bilateral); Hernia repair; Cholecystectomy; Tubal ligation; and Foot surgery (Left).   Her family history includes Arthritis in her mother, sister, and sister; COPD in her mother; Diabetes in her brother, brother, mother, sister, and sister; Hypertension in her mother; Mental illness in her brother.She reports that she quit smoking about 37 years ago. Her smoking use included cigarettes. She has never used smokeless tobacco. She reports that she does not drink alcohol and does not use drugs.    ROS Review of Systems  Constitutional: Negative.   HENT:  Negative for congestion.   Eyes:  Negative for visual disturbance.  Respiratory:  Negative for shortness of breath.   Cardiovascular:  Negative for chest pain.  Gastrointestinal:  Negative for abdominal pain, constipation, diarrhea, nausea and vomiting.   Genitourinary:  Negative for difficulty urinating.  Musculoskeletal:  Negative for arthralgias and myalgias.  Neurological:  Negative for headaches.  Psychiatric/Behavioral:  Negative for sleep disturbance.     Objective:  BP (!) 157/81   Pulse (!) 114   Temp (!) 96.8 F (36 C)   Ht _0  (1.6 m)   Wt 214 lb 12.8 oz (97.4 kg)   SpO2 96%   BMI 38.05 kg/m   BP Readings from Last 3 Encounters:  09/16/22 (!) 157/81  08/25/22 125/60  05/22/22 131/77    Wt Readings from Last 3 Encounters:  09/16/22 214 lb 12.8 oz (97.4 kg)  08/25/22 205 lb 12.8 oz (93.4 kg)  05/22/22 213 lb 3.2 oz (96.7 kg)     Physical Exam Constitutional:      General: She is not in acute distress.    Appearance: She is well-developed.  HENT:     Head: Normocephalic and atraumatic.  Eyes:     Conjunctiva/sclera: Conjunctivae normal.     Pupils: Pupils are equal, round, and reactive to light.  Neck:     Thyroid: No thyromegaly.  Cardiovascular:     Rate and Rhythm: Normal rate and regular rhythm.     Heart sounds: Normal heart sounds. No murmur heard. Pulmonary:     Effort: Pulmonary effort is normal. No respiratory distress.     Breath sounds: Normal breath sounds. No wheezing or rales.  Abdominal:     General: Bowel sounds  are normal. There is no distension.     Palpations: Abdomen is soft.     Tenderness: There is no abdominal tenderness.  Musculoskeletal:        General: Normal range of motion.     Cervical back: Normal range of motion and neck supple.  Lymphadenopathy:     Cervical: No cervical adenopathy.  Skin:    General: Skin is warm and dry.  Neurological:     Mental Status: She is alert and oriented to person, place, and time.  Psychiatric:        Behavior: Behavior normal.        Thought Content: Thought content normal.        Judgment: Judgment normal.       Assessment & Plan:   Anhelica was seen today for er follow up.  Diagnoses and all orders for this  visit:  Longstanding persistent atrial fibrillation (HCC) -     Magnesium -     Phosphorus -     CMP14+EGFR -     TSH + free T4 -     carvedilol (COREG) 6.25 MG tablet; Take 1 tablet (6.25 mg total) by mouth 2 (two) times daily with a meal.  Skin lesions -     Ambulatory referral to Dermatology  Essential hypertension       I have changed Osker Mason. Hally's carvedilol. I am also having her maintain her diphenhydrAMINE, HYDROcodone-acetaminophen, palbociclib, True Metrix Air Glucose Meter, True Metrix Blood Glucose Test, TRUEplus Lancets 33G, B-D SINGLE USE SWABS REGULAR, mupirocin ointment, letrozole, baclofen, apixaban, Belsomra, mometasone, Ozempic (0.25 or 0.5 MG/DOSE), metFORMIN, ARIPiprazole, atorvastatin, busPIRone, diltiazem, DULoxetine, and rOPINIRole.  Allergies as of 09/16/2022       Reactions   Codeine Rash        Medication List        Accurate as of September 16, 2022 10:15 PM. If you have any questions, ask your nurse or doctor.          apixaban 5 MG Tabs tablet Commonly known as: Eliquis Take 1 tablet (5 mg total) by mouth 2 (two) times daily.   ARIPiprazole 5 MG tablet Commonly known as: ABILIFY TAKE ONE (1) TABLET EACH DAY   atorvastatin 80 MG tablet Commonly known as: LIPITOR TAKE ONE (1) TABLET EACH DAY   B-D SINGLE USE SWABS REGULAR Pads Test BS QID Dx E11.8   baclofen 10 MG tablet Commonly known as: LIORESAL Take 10 mg by mouth 4 (four) times daily.   Belsomra 15 MG Tabs Generic drug: Suvorexant TAKE 1 TABLET AT BEDTIME FOR SLEEP   busPIRone 10 MG tablet Commonly known as: BUSPAR Take 1 tablet (10 mg total) by mouth 3 (three) times daily as needed.   carvedilol 6.25 MG tablet Commonly known as: COREG Take 1 tablet (6.25 mg total) by mouth 2 (two) times daily with a meal. What changed:  medication strength how much to take how to take this when to take this additional instructions Changed by: Claretta Fraise, MD    diltiazem 240 MG 24 hr capsule Commonly known as: CARDIZEM CD Take 1 capsule (240 mg total) by mouth 2 (two) times daily.   diphenhydrAMINE 25 mg capsule Commonly known as: BENADRYL Take 25 mg by mouth at bedtime as needed.   DULoxetine 60 MG capsule Commonly known as: CYMBALTA TAKE 1 CAPSULE EVERY MORNING AND TAKE 1 CAPSULE AT BEDTIME   HYDROcodone-acetaminophen 10-325 MG tablet Commonly known as: NORCO Take 1 tablet by mouth  every 6 (six) hours as needed.   letrozole 2.5 MG tablet Commonly known as: FEMARA Take 2.5 mg by mouth daily.   metFORMIN 750 MG 24 hr tablet Commonly known as: GLUCOPHAGE-XR TAKE ONE (1) TABLET BY MOUTH TWO (2) TIMES DAILY   mometasone 0.1 % cream Commonly known as: Elocon Apply 1 Application topically daily. To affected areas   mupirocin ointment 2 % Commonly known as: BACTROBAN APPLY & COVER WITH BANDAGE TWICE DAILY   Ozempic (0.25 or 0.5 MG/DOSE) 2 MG/3ML Sopn Generic drug: Semaglutide(0.25 or 0.5MG/DOS) INJECT 0.5MG INTO THE SKIN ONCE A WEEK   palbociclib 125 MG tablet Commonly known as: IBRANCE Take by mouth.   rOPINIRole 1 MG tablet Commonly known as: REQUIP Take 1 tablet (1 mg total) by mouth at bedtime.   True Metrix Air Glucose Meter w/Device Kit Test BS QID Dx E11.8   True Metrix Blood Glucose Test test strip Generic drug: glucose blood Test BS QID Dx E11.8   TRUEplus Lancets 33G Misc Test BS QID Dx E11.8         Follow-up: Return in about 6 weeks (around 10/28/2022).  Claretta Fraise, M.D.

## 2022-09-17 LAB — CMP14+EGFR
ALT: 77 IU/L — ABNORMAL HIGH (ref 0–32)
AST: 124 IU/L — ABNORMAL HIGH (ref 0–40)
Albumin/Globulin Ratio: 1.1 — ABNORMAL LOW (ref 1.2–2.2)
Albumin: 3.5 g/dL — ABNORMAL LOW (ref 3.9–4.9)
Alkaline Phosphatase: 141 IU/L — ABNORMAL HIGH (ref 44–121)
BUN/Creatinine Ratio: 10 — ABNORMAL LOW (ref 12–28)
BUN: 6 mg/dL — ABNORMAL LOW (ref 8–27)
Bilirubin Total: 0.7 mg/dL (ref 0.0–1.2)
CO2: 23 mmol/L (ref 20–29)
Calcium: 8.9 mg/dL (ref 8.7–10.3)
Chloride: 98 mmol/L (ref 96–106)
Creatinine, Ser: 0.63 mg/dL (ref 0.57–1.00)
Globulin, Total: 3.2 g/dL (ref 1.5–4.5)
Glucose: 176 mg/dL — ABNORMAL HIGH (ref 70–99)
Potassium: 3.4 mmol/L — ABNORMAL LOW (ref 3.5–5.2)
Sodium: 140 mmol/L (ref 134–144)
Total Protein: 6.7 g/dL (ref 6.0–8.5)
eGFR: 97 mL/min/{1.73_m2} (ref >59)

## 2022-09-17 LAB — TSH+FREE T4
Free T4: 1.72 ng/dL (ref 0.82–1.77)
TSH: 0.796 u[IU]/mL (ref 0.450–4.500)

## 2022-09-17 LAB — PHOSPHORUS: Phosphorus: 2.8 mg/dL — ABNORMAL LOW (ref 3.0–4.3)

## 2022-09-17 LAB — MAGNESIUM: Magnesium: 1.4 mg/dL — ABNORMAL LOW (ref 1.6–2.3)

## 2022-09-22 DIAGNOSIS — I10 Essential (primary) hypertension: Secondary | ICD-10-CM | POA: Diagnosis not present

## 2022-09-22 DIAGNOSIS — I471 Supraventricular tachycardia, unspecified: Secondary | ICD-10-CM | POA: Diagnosis not present

## 2022-09-22 DIAGNOSIS — I4719 Other supraventricular tachycardia: Secondary | ICD-10-CM | POA: Diagnosis not present

## 2022-09-30 DIAGNOSIS — E7800 Pure hypercholesterolemia, unspecified: Secondary | ICD-10-CM | POA: Diagnosis not present

## 2022-09-30 DIAGNOSIS — Z6838 Body mass index (BMI) 38.0-38.9, adult: Secondary | ICD-10-CM | POA: Diagnosis not present

## 2022-09-30 DIAGNOSIS — D539 Nutritional anemia, unspecified: Secondary | ICD-10-CM | POA: Diagnosis not present

## 2022-09-30 DIAGNOSIS — G8929 Other chronic pain: Secondary | ICD-10-CM | POA: Diagnosis not present

## 2022-09-30 DIAGNOSIS — M545 Low back pain, unspecified: Secondary | ICD-10-CM | POA: Diagnosis not present

## 2022-09-30 DIAGNOSIS — R7309 Other abnormal glucose: Secondary | ICD-10-CM | POA: Diagnosis not present

## 2022-09-30 DIAGNOSIS — R030 Elevated blood-pressure reading, without diagnosis of hypertension: Secondary | ICD-10-CM | POA: Diagnosis not present

## 2022-09-30 DIAGNOSIS — Z6836 Body mass index (BMI) 36.0-36.9, adult: Secondary | ICD-10-CM | POA: Diagnosis not present

## 2022-09-30 DIAGNOSIS — R69 Illness, unspecified: Secondary | ICD-10-CM | POA: Diagnosis not present

## 2022-09-30 DIAGNOSIS — Z79899 Other long term (current) drug therapy: Secondary | ICD-10-CM | POA: Diagnosis not present

## 2022-09-30 DIAGNOSIS — C50912 Malignant neoplasm of unspecified site of left female breast: Secondary | ICD-10-CM | POA: Diagnosis not present

## 2022-09-30 DIAGNOSIS — Z9181 History of falling: Secondary | ICD-10-CM | POA: Diagnosis not present

## 2022-09-30 DIAGNOSIS — E119 Type 2 diabetes mellitus without complications: Secondary | ICD-10-CM | POA: Diagnosis not present

## 2022-10-02 DIAGNOSIS — I471 Supraventricular tachycardia, unspecified: Secondary | ICD-10-CM | POA: Diagnosis not present

## 2022-10-02 DIAGNOSIS — I272 Pulmonary hypertension, unspecified: Secondary | ICD-10-CM | POA: Diagnosis not present

## 2022-10-02 DIAGNOSIS — I10 Essential (primary) hypertension: Secondary | ICD-10-CM | POA: Diagnosis not present

## 2022-10-05 DIAGNOSIS — G894 Chronic pain syndrome: Secondary | ICD-10-CM | POA: Diagnosis not present

## 2022-10-05 DIAGNOSIS — M62838 Other muscle spasm: Secondary | ICD-10-CM | POA: Diagnosis not present

## 2022-10-16 ENCOUNTER — Other Ambulatory Visit: Payer: Self-pay | Admitting: Family Medicine

## 2022-10-21 DIAGNOSIS — C773 Secondary and unspecified malignant neoplasm of axilla and upper limb lymph nodes: Secondary | ICD-10-CM | POA: Diagnosis not present

## 2022-10-21 DIAGNOSIS — C50919 Malignant neoplasm of unspecified site of unspecified female breast: Secondary | ICD-10-CM | POA: Diagnosis not present

## 2022-10-21 DIAGNOSIS — C7951 Secondary malignant neoplasm of bone: Secondary | ICD-10-CM | POA: Diagnosis not present

## 2022-10-21 DIAGNOSIS — C50912 Malignant neoplasm of unspecified site of left female breast: Secondary | ICD-10-CM | POA: Diagnosis not present

## 2022-10-21 DIAGNOSIS — E611 Iron deficiency: Secondary | ICD-10-CM | POA: Diagnosis not present

## 2022-10-30 DIAGNOSIS — E611 Iron deficiency: Secondary | ICD-10-CM | POA: Diagnosis not present

## 2022-10-31 DIAGNOSIS — G4700 Insomnia, unspecified: Secondary | ICD-10-CM | POA: Diagnosis not present

## 2022-10-31 DIAGNOSIS — R7309 Other abnormal glucose: Secondary | ICD-10-CM | POA: Diagnosis not present

## 2022-10-31 DIAGNOSIS — M545 Low back pain, unspecified: Secondary | ICD-10-CM | POA: Diagnosis not present

## 2022-10-31 DIAGNOSIS — Z9181 History of falling: Secondary | ICD-10-CM | POA: Diagnosis not present

## 2022-10-31 DIAGNOSIS — G8929 Other chronic pain: Secondary | ICD-10-CM | POA: Diagnosis not present

## 2022-10-31 DIAGNOSIS — L981 Factitial dermatitis: Secondary | ICD-10-CM | POA: Diagnosis not present

## 2022-10-31 DIAGNOSIS — R69 Illness, unspecified: Secondary | ICD-10-CM | POA: Diagnosis not present

## 2022-10-31 DIAGNOSIS — C50912 Malignant neoplasm of unspecified site of left female breast: Secondary | ICD-10-CM | POA: Diagnosis not present

## 2022-10-31 DIAGNOSIS — Z6836 Body mass index (BMI) 36.0-36.9, adult: Secondary | ICD-10-CM | POA: Diagnosis not present

## 2022-10-31 DIAGNOSIS — R030 Elevated blood-pressure reading, without diagnosis of hypertension: Secondary | ICD-10-CM | POA: Diagnosis not present

## 2022-11-05 DIAGNOSIS — M62838 Other muscle spasm: Secondary | ICD-10-CM | POA: Diagnosis not present

## 2022-11-05 DIAGNOSIS — G894 Chronic pain syndrome: Secondary | ICD-10-CM | POA: Diagnosis not present

## 2022-11-21 DIAGNOSIS — C7951 Secondary malignant neoplasm of bone: Secondary | ICD-10-CM | POA: Diagnosis not present

## 2022-11-21 DIAGNOSIS — C50919 Malignant neoplasm of unspecified site of unspecified female breast: Secondary | ICD-10-CM | POA: Diagnosis not present

## 2022-11-25 ENCOUNTER — Telehealth: Payer: Self-pay | Admitting: Family Medicine

## 2022-11-25 NOTE — Telephone Encounter (Signed)
Pt. Needs to be seen for this. Thanks, WS 

## 2022-11-26 NOTE — Telephone Encounter (Signed)
Patient aware and states she will discuss at her next apt with Dr. Livia Snellen.

## 2022-12-01 DIAGNOSIS — C50912 Malignant neoplasm of unspecified site of left female breast: Secondary | ICD-10-CM | POA: Diagnosis not present

## 2022-12-01 DIAGNOSIS — L981 Factitial dermatitis: Secondary | ICD-10-CM | POA: Diagnosis not present

## 2022-12-01 DIAGNOSIS — M545 Low back pain, unspecified: Secondary | ICD-10-CM | POA: Diagnosis not present

## 2022-12-01 DIAGNOSIS — Z0000 Encounter for general adult medical examination without abnormal findings: Secondary | ICD-10-CM | POA: Diagnosis not present

## 2022-12-01 DIAGNOSIS — Z9181 History of falling: Secondary | ICD-10-CM | POA: Diagnosis not present

## 2022-12-01 DIAGNOSIS — G4700 Insomnia, unspecified: Secondary | ICD-10-CM | POA: Diagnosis not present

## 2022-12-01 DIAGNOSIS — F411 Generalized anxiety disorder: Secondary | ICD-10-CM | POA: Diagnosis not present

## 2022-12-01 DIAGNOSIS — G8929 Other chronic pain: Secondary | ICD-10-CM | POA: Diagnosis not present

## 2022-12-01 DIAGNOSIS — R69 Illness, unspecified: Secondary | ICD-10-CM | POA: Diagnosis not present

## 2022-12-01 DIAGNOSIS — Z6834 Body mass index (BMI) 34.0-34.9, adult: Secondary | ICD-10-CM | POA: Diagnosis not present

## 2022-12-01 DIAGNOSIS — R7309 Other abnormal glucose: Secondary | ICD-10-CM | POA: Diagnosis not present

## 2022-12-01 DIAGNOSIS — Z6836 Body mass index (BMI) 36.0-36.9, adult: Secondary | ICD-10-CM | POA: Diagnosis not present

## 2022-12-01 DIAGNOSIS — R030 Elevated blood-pressure reading, without diagnosis of hypertension: Secondary | ICD-10-CM | POA: Diagnosis not present

## 2022-12-06 DIAGNOSIS — G894 Chronic pain syndrome: Secondary | ICD-10-CM | POA: Diagnosis not present

## 2022-12-06 DIAGNOSIS — M62838 Other muscle spasm: Secondary | ICD-10-CM | POA: Diagnosis not present

## 2022-12-17 ENCOUNTER — Ambulatory Visit (INDEPENDENT_AMBULATORY_CARE_PROVIDER_SITE_OTHER): Payer: Medicare HMO | Admitting: Family Medicine

## 2022-12-17 ENCOUNTER — Encounter: Payer: Self-pay | Admitting: Family Medicine

## 2022-12-17 VITALS — BP 134/74 | HR 108 | Temp 97.7°F | Ht 63.0 in | Wt 198.0 lb

## 2022-12-17 DIAGNOSIS — E11622 Type 2 diabetes mellitus with other skin ulcer: Secondary | ICD-10-CM | POA: Diagnosis not present

## 2022-12-17 DIAGNOSIS — L98492 Non-pressure chronic ulcer of skin of other sites with fat layer exposed: Secondary | ICD-10-CM | POA: Diagnosis not present

## 2022-12-17 DIAGNOSIS — I10 Essential (primary) hypertension: Secondary | ICD-10-CM | POA: Diagnosis not present

## 2022-12-17 DIAGNOSIS — E118 Type 2 diabetes mellitus with unspecified complications: Secondary | ICD-10-CM | POA: Diagnosis not present

## 2022-12-17 DIAGNOSIS — E785 Hyperlipidemia, unspecified: Secondary | ICD-10-CM | POA: Diagnosis not present

## 2022-12-17 LAB — BAYER DCA HB A1C WAIVED: HB A1C (BAYER DCA - WAIVED): 6.9 % — ABNORMAL HIGH (ref 4.8–5.6)

## 2022-12-17 LAB — CBC WITH DIFFERENTIAL/PLATELET

## 2022-12-17 NOTE — Progress Notes (Signed)
Subjective:  Patient ID: Diana Mathews,  female    DOB: 1951/03/20  Age: 72 y.o.    CC: Medical Management of Chronic Issues   HPI NOYA GRIFFUS presents for  follow-up of hypertension. Patient has no history of headache chest pain or shortness of breath or recent cough. Patient also denies symptoms of TIA such as numbness weakness lateralizing. Patient denies side effects from medication. States taking it regularly.  Patient also  in for follow-up of elevated cholesterol. Doing well without complaints on current medication. Denies side effects  including myalgia and arthralgia and nausea. Also in today for liver function testing. Currently no chest pain, shortness of breath or other cardiovascular related symptoms noted.  Follow-up of diabetes. Patient does check blood sugar at home. Readings run between 120 and 140 Patient denies symptoms such as excessive hunger or urinary frequency, excessive hunger, nausea No significant hypoglycemic spells noted. Medications reviewed. Pt reports taking them regularly. Pt. denies complication/adverse reaction today.   Atrial fibrillation follow up. Pt. is treated with rate control and anticoagulation. Pt.  denies palpitations, rapid rate, chest pain, dyspnea and edema. There has been no bleeding from nose or gums. Pt. has not noticed blood with urine or stool.  Although there is routine bruising easily, it is not excessive.   Sores at upper back recurring, hard to heal. [place] just came up History Violett has a past medical history of Atrial fibrillation ([place]) (06/12/2018), Depression, Essential hypertension, Headache, Hyperlipidemia, Metastatic breast cancer, Osteoarthritis, Sleep apnea, TIA (transient ischemic attack), and Type 2 diabetes mellitus ([place]).   She has a past surgical history that includes Abdominal hysterectomy; Wrist surgery (Bilateral); Hernia repair; Cholecystectomy; Tubal ligation; and Foot surgery (Left).   Her family  history includes Arthritis in her mother, sister, and sister; COPD in her mother; Diabetes in her brother, brother, mother, sister, and sister; Hypertension in her mother; Mental illness in her brother.She reports that she quit smoking about 37 years ago. Her smoking use included cigarettes. She has never used smokeless tobacco. She reports that she does not drink alcohol and does not use drugs.  Current Outpatient Medications on File Prior to Visit  Medication Sig Dispense Refill   Alcohol Swabs (B-D SINGLE USE SWABS REGULAR) PADS Test BS QID Dx E11.8 400 each 3   apixaban (ELIQUIS) 5 MG TABS tablet Take 1 tablet (5 mg total) by mouth 2 (two) times daily. 180 tablet 3   ARIPiprazole (ABILIFY) 5 MG tablet TAKE ONE (1) TABLET EACH DAY 90 tablet 3   atorvastatin (LIPITOR) 80 MG tablet TAKE ONE (1) TABLET EACH DAY 90 tablet 2   baclofen (LIORESAL) 10 MG tablet Take 10 mg by mouth 4 (four) times daily.     Blood Glucose Monitoring Suppl (TRUE METRIX AIR GLUCOSE METER) w/Device KIT Test BS QID Dx E11.8 1 kit 0   busPIRone (BUSPAR) 10 MG tablet Take 1 tablet (10 mg total) by mouth 3 (three) times daily as needed. 270 tablet 3   carvedilol (COREG) 6.25 MG tablet Take 1 tablet (6.25 mg total) by mouth 2 (two) times daily with a meal. 180 tablet 3   diltiazem (CARDIZEM CD) 240 MG 24 hr capsule Take 1 capsule (240 mg total) by mouth 2 (two) times daily. 180 capsule 3   diphenhydrAMINE (BENADRYL) 25 mg capsule Take 25 mg by mouth at bedtime as needed.     DULoxetine (CYMBALTA) 60 MG capsule TAKE 1 CAPSULE EVERY MORNING AND TAKE 1 CAPSULE AT BEDTIME  180 capsule 3   glucose blood (TRUE METRIX BLOOD GLUCOSE TEST) test strip Test BS QID Dx E11.8 400 each 3   HYDROcodone-acetaminophen (NORCO) 10-325 MG tablet Take 1 tablet by mouth every 6 (six) hours as needed.     letrozole (FEMARA) 2.5 MG tablet Take 2.5 mg by mouth daily.     metFORMIN (GLUCOPHAGE-XR) 750 MG 24 hr tablet TAKE ONE (1) TABLET BY MOUTH TWO (2)  TIMES DAILY 180 tablet 3   mometasone (ELOCON) 0.1 % cream Apply 1 Application topically daily. To affected areas 45 g 5   mupirocin ointment (BACTROBAN) 2 % APPLY & COVER WITH BANDAGE TWICE DAILY 22 g 0   OZEMPIC, 0.25 OR 0.5 MG/DOSE, 2 MG/3ML SOPN INJECT 0.'5MG'$  INTO THE SKIN ONCE A WEEK 9 mL 0   palbociclib (IBRANCE) 125 MG tablet Take by mouth.     rOPINIRole (REQUIP) 1 MG tablet Take 1 tablet (1 mg total) by mouth at bedtime. 90 tablet 3   Suvorexant (BELSOMRA) 15 MG TABS TAKE 1 TABLET AT BEDTIME AS NEEDED FOR SLEEP 30 tablet 2   TRUEplus Lancets 33G MISC Test BS QID Dx E11.8 400 each 3   No current facility-administered medications on file prior to visit.    ROS Review of Systems  Constitutional: Negative.   HENT: Negative.    Eyes:  Negative for visual disturbance.  Respiratory:  Negative for shortness of breath.   Cardiovascular:  Negative for chest pain.  Gastrointestinal:  Negative for abdominal pain.  Musculoskeletal:  Negative for arthralgias.    Objective:  BP 134/74   Pulse (!) 108   Temp 97.7 F (36.5 C)   Ht '5\' 3"'$  (1.6 m)   Wt 198 lb (89.8 kg) Comment: patient reports  SpO2 98%   BMI 35.07 kg/m   BP Readings from Last 3 Encounters:  12/17/22 134/74  09/16/22 (!) 157/81  08/25/22 125/60    Wt Readings from Last 3 Encounters:  12/17/22 198 lb (89.8 kg)  09/16/22 214 lb 12.8 oz (97.4 kg)  08/25/22 205 lb 12.8 oz (93.4 kg)     Physical Exam Constitutional:      General: She is not in acute distress.    Appearance: She is well-developed.  Cardiovascular:     Rate and Rhythm: Normal rate and regular rhythm.  Pulmonary:     Breath sounds: Normal breath sounds.  Musculoskeletal:        General: Normal range of motion.  Skin:    General: Skin is warm and dry.     Findings: Lesion (multiple at upper back - see pics below) present.  Neurological:     Mental Status: She is alert and oriented to person, place, and time.        Lab Results   Component Value Date   HGBA1C 6.9 (H) 12/17/2022   HGBA1C 6.0 (H) 08/25/2022   HGBA1C 6.5 (H) 05/22/2022    Assessment & Plan:   Andrielle was seen today for medical management of chronic issues.  Diagnoses and all orders for this visit:  Essential hypertension -     CBC with Differential/Platelet -     CMP14+EGFR  Controlled type 2 diabetes mellitus with complication, without long-term current use of insulin (HCC) -     Bayer DCA Hb A1c Waived  Hyperlipidemia, unspecified hyperlipidemia type -     Lipid panel  Skin ulcer with fat layer exposed ([place]) -     AMB referral to wound care center   I  am having Osker Mason. Kasa maintain her diphenhydrAMINE, HYDROcodone-acetaminophen, palbociclib, True Metrix Air Glucose Meter, True Metrix Blood Glucose Test, TRUEplus Lancets 33G, B-D SINGLE USE SWABS REGULAR, mupirocin ointment, letrozole, baclofen, apixaban, mometasone, metFORMIN, ARIPiprazole, atorvastatin, busPIRone, diltiazem, DULoxetine, rOPINIRole, carvedilol, Belsomra, and Ozempic (0.25 or 0.5 MG/DOSE).  No orders of the defined types were placed in this encounter.    Follow-up: Return in about 3 months (around 03/17/2023).  Claretta Fraise, M.D.

## 2022-12-18 LAB — CMP14+EGFR
ALT: 30 IU/L (ref 0–32)
AST: 36 IU/L (ref 0–40)
Albumin/Globulin Ratio: 1.3 (ref 1.2–2.2)
Albumin: 3.9 g/dL (ref 3.9–4.9)
Alkaline Phosphatase: 186 IU/L — ABNORMAL HIGH (ref 44–121)
BUN/Creatinine Ratio: 15 (ref 12–28)
BUN: 11 mg/dL (ref 8–27)
Bilirubin Total: 0.6 mg/dL (ref 0.0–1.2)
CO2: 21 mmol/L (ref 20–29)
Calcium: 9.3 mg/dL (ref 8.7–10.3)
Chloride: 100 mmol/L (ref 96–106)
Creatinine, Ser: 0.71 mg/dL (ref 0.57–1.00)
Globulin, Total: 3.1 g/dL (ref 1.5–4.5)
Glucose: 219 mg/dL — ABNORMAL HIGH (ref 70–99)
Potassium: 4.1 mmol/L (ref 3.5–5.2)
Sodium: 141 mmol/L (ref 134–144)
Total Protein: 7 g/dL (ref 6.0–8.5)
eGFR: 92 mL/min/{1.73_m2} (ref >59)

## 2022-12-18 LAB — CBC WITH DIFFERENTIAL/PLATELET
Basophils Absolute: 0 10*3/uL (ref 0.0–0.2)
Basos: 1 %
EOS (ABSOLUTE): 0 10*3/uL (ref 0.0–0.4)
Eos: 0 %
Hematocrit: 33.2 % — ABNORMAL LOW (ref 34.0–46.6)
Hemoglobin: 11.2 g/dL (ref 11.1–15.9)
Immature Grans (Abs): 0 10*3/uL (ref 0.0–0.1)
Immature Granulocytes: 0 %
Lymphocytes Absolute: 2.7 10*3/uL (ref 0.7–3.1)
Lymphs: 59 %
MCH: 40.1 pg — ABNORMAL HIGH (ref 26.6–33.0)
MCHC: 33.7 g/dL (ref 31.5–35.7)
MCV: 119 fL — ABNORMAL HIGH (ref 79–97)
Monocytes Absolute: 0.3 10*3/uL (ref 0.1–0.9)
Monocytes: 6 %
Neutrophils Absolute: 1.6 10*3/uL (ref 1.4–7.0)
Neutrophils: 34 %
Platelets: 197 10*3/uL (ref 150–450)
RBC: 2.79 x10E6/uL — ABNORMAL LOW (ref 3.77–5.28)
RDW: 15.9 % — ABNORMAL HIGH (ref 11.7–15.4)
WBC: 4.6 10*3/uL (ref 3.4–10.8)

## 2022-12-18 LAB — LIPID PANEL
Chol/HDL Ratio: 1.5 ratio (ref 0.0–4.4)
Cholesterol, Total: 110 mg/dL (ref 100–199)
HDL: 72 mg/dL (ref >39)
LDL Chol Calc (NIH): 20 mg/dL (ref 0–99)
Triglycerides: 97 mg/dL (ref 0–149)
VLDL Cholesterol Cal: 18 mg/dL (ref 5–40)

## 2022-12-19 ENCOUNTER — Telehealth: Payer: Self-pay | Admitting: Family Medicine

## 2022-12-19 ENCOUNTER — Encounter: Payer: Self-pay | Admitting: Family Medicine

## 2022-12-19 NOTE — Telephone Encounter (Signed)
Insurance will no longer cover her sleep medicine Belsomia.  Could something else be called in to replace this one.  [facility] [place]

## 2022-12-22 ENCOUNTER — Other Ambulatory Visit: Payer: Self-pay | Admitting: Family Medicine

## 2022-12-22 MED ORDER — TRAZODONE HCL 150 MG PO TABS: ORAL_TABLET | ORAL | 5 refills | Status: DC

## 2022-12-22 NOTE — Telephone Encounter (Signed)
Patient aware.

## 2022-12-22 NOTE — Telephone Encounter (Signed)
I sent in trazodone. Insurance not covering any of the other common sleeping pills

## 2022-12-23 DIAGNOSIS — E6609 Other obesity due to excess calories: Secondary | ICD-10-CM | POA: Diagnosis not present

## 2022-12-23 DIAGNOSIS — L98492 Non-pressure chronic ulcer of skin of other sites with fat layer exposed: Secondary | ICD-10-CM | POA: Diagnosis not present

## 2022-12-23 DIAGNOSIS — C50912 Malignant neoplasm of unspecified site of left female breast: Secondary | ICD-10-CM | POA: Diagnosis not present

## 2022-12-23 DIAGNOSIS — D701 Agranulocytosis secondary to cancer chemotherapy: Secondary | ICD-10-CM | POA: Diagnosis not present

## 2022-12-23 DIAGNOSIS — E118 Type 2 diabetes mellitus with unspecified complications: Secondary | ICD-10-CM | POA: Diagnosis not present

## 2022-12-23 DIAGNOSIS — C7951 Secondary malignant neoplasm of bone: Secondary | ICD-10-CM | POA: Diagnosis not present

## 2022-12-23 DIAGNOSIS — E11622 Type 2 diabetes mellitus with other skin ulcer: Secondary | ICD-10-CM | POA: Diagnosis not present

## 2022-12-23 DIAGNOSIS — R69 Illness, unspecified: Secondary | ICD-10-CM | POA: Diagnosis not present

## 2022-12-23 DIAGNOSIS — E611 Iron deficiency: Secondary | ICD-10-CM | POA: Diagnosis not present

## 2022-12-23 DIAGNOSIS — I4821 Permanent atrial fibrillation: Secondary | ICD-10-CM | POA: Diagnosis not present

## 2022-12-23 DIAGNOSIS — Z6834 Body mass index (BMI) 34.0-34.9, adult: Secondary | ICD-10-CM | POA: Diagnosis not present

## 2022-12-23 DIAGNOSIS — C773 Secondary and unspecified malignant neoplasm of axilla and upper limb lymph nodes: Secondary | ICD-10-CM | POA: Diagnosis not present

## 2022-12-23 DIAGNOSIS — T451X5A Adverse effect of antineoplastic and immunosuppressive drugs, initial encounter: Secondary | ICD-10-CM | POA: Diagnosis not present

## 2022-12-23 DIAGNOSIS — C50911 Malignant neoplasm of unspecified site of right female breast: Secondary | ICD-10-CM | POA: Diagnosis not present

## 2022-12-30 DIAGNOSIS — R030 Elevated blood-pressure reading, without diagnosis of hypertension: Secondary | ICD-10-CM | POA: Diagnosis not present

## 2022-12-30 DIAGNOSIS — G4700 Insomnia, unspecified: Secondary | ICD-10-CM | POA: Diagnosis not present

## 2022-12-30 DIAGNOSIS — M545 Low back pain, unspecified: Secondary | ICD-10-CM | POA: Diagnosis not present

## 2022-12-30 DIAGNOSIS — L981 Factitial dermatitis: Secondary | ICD-10-CM | POA: Diagnosis not present

## 2022-12-30 DIAGNOSIS — Z9181 History of falling: Secondary | ICD-10-CM | POA: Diagnosis not present

## 2022-12-30 DIAGNOSIS — C50912 Malignant neoplasm of unspecified site of left female breast: Secondary | ICD-10-CM | POA: Diagnosis not present

## 2022-12-30 DIAGNOSIS — Z6836 Body mass index (BMI) 36.0-36.9, adult: Secondary | ICD-10-CM | POA: Diagnosis not present

## 2022-12-30 DIAGNOSIS — R69 Illness, unspecified: Secondary | ICD-10-CM | POA: Diagnosis not present

## 2022-12-30 DIAGNOSIS — Z6834 Body mass index (BMI) 34.0-34.9, adult: Secondary | ICD-10-CM | POA: Diagnosis not present

## 2022-12-30 DIAGNOSIS — R7309 Other abnormal glucose: Secondary | ICD-10-CM | POA: Diagnosis not present

## 2022-12-30 DIAGNOSIS — G8929 Other chronic pain: Secondary | ICD-10-CM | POA: Diagnosis not present

## 2023-01-04 DIAGNOSIS — G894 Chronic pain syndrome: Secondary | ICD-10-CM | POA: Diagnosis not present

## 2023-01-04 DIAGNOSIS — M62838 Other muscle spasm: Secondary | ICD-10-CM | POA: Diagnosis not present

## 2023-01-07 DIAGNOSIS — I471 Supraventricular tachycardia, unspecified: Secondary | ICD-10-CM | POA: Diagnosis not present

## 2023-01-07 DIAGNOSIS — I480 Paroxysmal atrial fibrillation: Secondary | ICD-10-CM | POA: Diagnosis not present

## 2023-01-07 DIAGNOSIS — C50912 Malignant neoplasm of unspecified site of left female breast: Secondary | ICD-10-CM | POA: Diagnosis not present

## 2023-01-07 DIAGNOSIS — E118 Type 2 diabetes mellitus with unspecified complications: Secondary | ICD-10-CM | POA: Diagnosis not present

## 2023-01-07 DIAGNOSIS — I4719 Other supraventricular tachycardia: Secondary | ICD-10-CM | POA: Diagnosis not present

## 2023-01-07 DIAGNOSIS — Z1231 Encounter for screening mammogram for malignant neoplasm of breast: Secondary | ICD-10-CM | POA: Diagnosis not present

## 2023-01-07 DIAGNOSIS — C773 Secondary and unspecified malignant neoplasm of axilla and upper limb lymph nodes: Secondary | ICD-10-CM | POA: Diagnosis not present

## 2023-01-07 DIAGNOSIS — I10 Essential (primary) hypertension: Secondary | ICD-10-CM | POA: Diagnosis not present

## 2023-01-12 ENCOUNTER — Ambulatory Visit (HOSPITAL_BASED_OUTPATIENT_CLINIC_OR_DEPARTMENT_OTHER): Payer: Medicare HMO | Admitting: General Surgery

## 2023-01-12 DIAGNOSIS — E611 Iron deficiency: Secondary | ICD-10-CM | POA: Diagnosis not present

## 2023-01-13 DIAGNOSIS — C50912 Malignant neoplasm of unspecified site of left female breast: Secondary | ICD-10-CM | POA: Diagnosis not present

## 2023-01-13 DIAGNOSIS — R948 Abnormal results of function studies of other organs and systems: Secondary | ICD-10-CM | POA: Diagnosis not present

## 2023-01-13 DIAGNOSIS — C7951 Secondary malignant neoplasm of bone: Secondary | ICD-10-CM | POA: Diagnosis not present

## 2023-01-13 DIAGNOSIS — I2510 Atherosclerotic heart disease of native coronary artery without angina pectoris: Secondary | ICD-10-CM | POA: Diagnosis not present

## 2023-01-13 DIAGNOSIS — K838 Other specified diseases of biliary tract: Secondary | ICD-10-CM | POA: Diagnosis not present

## 2023-01-13 DIAGNOSIS — C773 Secondary and unspecified malignant neoplasm of axilla and upper limb lymph nodes: Secondary | ICD-10-CM | POA: Diagnosis not present

## 2023-01-16 ENCOUNTER — Other Ambulatory Visit: Payer: Self-pay | Admitting: Family Medicine

## 2023-01-20 DIAGNOSIS — C50912 Malignant neoplasm of unspecified site of left female breast: Secondary | ICD-10-CM | POA: Diagnosis not present

## 2023-01-20 DIAGNOSIS — C773 Secondary and unspecified malignant neoplasm of axilla and upper limb lymph nodes: Secondary | ICD-10-CM | POA: Diagnosis not present

## 2023-01-20 DIAGNOSIS — T8133XA Disruption of traumatic injury wound repair, initial encounter: Secondary | ICD-10-CM | POA: Diagnosis not present

## 2023-01-20 DIAGNOSIS — I10 Essential (primary) hypertension: Secondary | ICD-10-CM | POA: Diagnosis not present

## 2023-01-21 DIAGNOSIS — I4719 Other supraventricular tachycardia: Secondary | ICD-10-CM | POA: Diagnosis not present

## 2023-01-21 DIAGNOSIS — I471 Supraventricular tachycardia, unspecified: Secondary | ICD-10-CM | POA: Diagnosis not present

## 2023-01-21 DIAGNOSIS — I10 Essential (primary) hypertension: Secondary | ICD-10-CM | POA: Diagnosis not present

## 2023-01-22 DIAGNOSIS — G8929 Other chronic pain: Secondary | ICD-10-CM | POA: Diagnosis not present

## 2023-01-22 DIAGNOSIS — R69 Illness, unspecified: Secondary | ICD-10-CM | POA: Diagnosis not present

## 2023-01-22 DIAGNOSIS — M545 Low back pain, unspecified: Secondary | ICD-10-CM | POA: Diagnosis not present

## 2023-01-22 DIAGNOSIS — Z6834 Body mass index (BMI) 34.0-34.9, adult: Secondary | ICD-10-CM | POA: Diagnosis not present

## 2023-01-22 DIAGNOSIS — G4700 Insomnia, unspecified: Secondary | ICD-10-CM | POA: Diagnosis not present

## 2023-01-22 DIAGNOSIS — Z79899 Other long term (current) drug therapy: Secondary | ICD-10-CM | POA: Diagnosis not present

## 2023-01-22 DIAGNOSIS — R030 Elevated blood-pressure reading, without diagnosis of hypertension: Secondary | ICD-10-CM | POA: Diagnosis not present

## 2023-01-22 DIAGNOSIS — E7800 Pure hypercholesterolemia, unspecified: Secondary | ICD-10-CM | POA: Diagnosis not present

## 2023-01-22 DIAGNOSIS — C50912 Malignant neoplasm of unspecified site of left female breast: Secondary | ICD-10-CM | POA: Diagnosis not present

## 2023-01-22 DIAGNOSIS — Z6836 Body mass index (BMI) 36.0-36.9, adult: Secondary | ICD-10-CM | POA: Diagnosis not present

## 2023-01-22 DIAGNOSIS — E119 Type 2 diabetes mellitus without complications: Secondary | ICD-10-CM | POA: Diagnosis not present

## 2023-01-22 DIAGNOSIS — R7309 Other abnormal glucose: Secondary | ICD-10-CM | POA: Diagnosis not present

## 2023-01-22 DIAGNOSIS — Z9181 History of falling: Secondary | ICD-10-CM | POA: Diagnosis not present

## 2023-01-29 ENCOUNTER — Other Ambulatory Visit: Payer: Self-pay | Admitting: *Deleted

## 2023-01-29 DIAGNOSIS — E118 Type 2 diabetes mellitus with unspecified complications: Secondary | ICD-10-CM

## 2023-01-29 MED ORDER — METFORMIN HCL ER 750 MG PO TB24: ORAL_TABLET | ORAL | 1 refills | Status: DC

## 2023-02-02 ENCOUNTER — Encounter: Payer: Self-pay | Admitting: *Deleted

## 2023-02-02 DIAGNOSIS — E611 Iron deficiency: Secondary | ICD-10-CM | POA: Diagnosis not present

## 2023-02-02 DIAGNOSIS — C50912 Malignant neoplasm of unspecified site of left female breast: Secondary | ICD-10-CM | POA: Diagnosis not present

## 2023-02-02 DIAGNOSIS — C7951 Secondary malignant neoplasm of bone: Secondary | ICD-10-CM | POA: Diagnosis not present

## 2023-02-02 DIAGNOSIS — C773 Secondary and unspecified malignant neoplasm of axilla and upper limb lymph nodes: Secondary | ICD-10-CM | POA: Diagnosis not present

## 2023-02-04 DIAGNOSIS — M62838 Other muscle spasm: Secondary | ICD-10-CM | POA: Diagnosis not present

## 2023-02-04 DIAGNOSIS — G894 Chronic pain syndrome: Secondary | ICD-10-CM | POA: Diagnosis not present

## 2023-02-16 DIAGNOSIS — E611 Iron deficiency: Secondary | ICD-10-CM | POA: Diagnosis not present

## 2023-02-18 DIAGNOSIS — C773 Secondary and unspecified malignant neoplasm of axilla and upper limb lymph nodes: Secondary | ICD-10-CM | POA: Diagnosis not present

## 2023-02-18 DIAGNOSIS — T148XXA Other injury of unspecified body region, initial encounter: Secondary | ICD-10-CM | POA: Diagnosis not present

## 2023-02-18 DIAGNOSIS — T8133XD Disruption of traumatic injury wound repair, subsequent encounter: Secondary | ICD-10-CM | POA: Diagnosis not present

## 2023-02-18 DIAGNOSIS — I10 Essential (primary) hypertension: Secondary | ICD-10-CM | POA: Diagnosis not present

## 2023-02-18 DIAGNOSIS — L089 Local infection of the skin and subcutaneous tissue, unspecified: Secondary | ICD-10-CM | POA: Diagnosis not present

## 2023-02-18 DIAGNOSIS — T8133XA Disruption of traumatic injury wound repair, initial encounter: Secondary | ICD-10-CM | POA: Diagnosis not present

## 2023-02-18 DIAGNOSIS — C50912 Malignant neoplasm of unspecified site of left female breast: Secondary | ICD-10-CM | POA: Diagnosis not present

## 2023-02-18 DIAGNOSIS — E6609 Other obesity due to excess calories: Secondary | ICD-10-CM | POA: Diagnosis not present

## 2023-02-18 DIAGNOSIS — Z6834 Body mass index (BMI) 34.0-34.9, adult: Secondary | ICD-10-CM | POA: Diagnosis not present

## 2023-02-23 DIAGNOSIS — G4700 Insomnia, unspecified: Secondary | ICD-10-CM | POA: Diagnosis not present

## 2023-02-23 DIAGNOSIS — R7309 Other abnormal glucose: Secondary | ICD-10-CM | POA: Diagnosis not present

## 2023-02-23 DIAGNOSIS — D539 Nutritional anemia, unspecified: Secondary | ICD-10-CM | POA: Diagnosis not present

## 2023-02-23 DIAGNOSIS — Z9181 History of falling: Secondary | ICD-10-CM | POA: Diagnosis not present

## 2023-02-23 DIAGNOSIS — Z6834 Body mass index (BMI) 34.0-34.9, adult: Secondary | ICD-10-CM | POA: Diagnosis not present

## 2023-02-23 DIAGNOSIS — C50912 Malignant neoplasm of unspecified site of left female breast: Secondary | ICD-10-CM | POA: Diagnosis not present

## 2023-02-23 DIAGNOSIS — Z6836 Body mass index (BMI) 36.0-36.9, adult: Secondary | ICD-10-CM | POA: Diagnosis not present

## 2023-02-23 DIAGNOSIS — M545 Low back pain, unspecified: Secondary | ICD-10-CM | POA: Diagnosis not present

## 2023-02-23 DIAGNOSIS — F411 Generalized anxiety disorder: Secondary | ICD-10-CM | POA: Diagnosis not present

## 2023-02-23 DIAGNOSIS — G8929 Other chronic pain: Secondary | ICD-10-CM | POA: Diagnosis not present

## 2023-02-23 DIAGNOSIS — R030 Elevated blood-pressure reading, without diagnosis of hypertension: Secondary | ICD-10-CM | POA: Diagnosis not present

## 2023-02-23 DIAGNOSIS — R7401 Elevation of levels of liver transaminase levels: Secondary | ICD-10-CM | POA: Diagnosis not present

## 2023-02-24 ENCOUNTER — Other Ambulatory Visit (HOSPITAL_COMMUNITY): Payer: Self-pay | Admitting: Internal Medicine

## 2023-02-24 DIAGNOSIS — R748 Abnormal levels of other serum enzymes: Secondary | ICD-10-CM

## 2023-03-03 DIAGNOSIS — C773 Secondary and unspecified malignant neoplasm of axilla and upper limb lymph nodes: Secondary | ICD-10-CM | POA: Diagnosis not present

## 2023-03-03 DIAGNOSIS — C50912 Malignant neoplasm of unspecified site of left female breast: Secondary | ICD-10-CM | POA: Diagnosis not present

## 2023-03-03 DIAGNOSIS — C50911 Malignant neoplasm of unspecified site of right female breast: Secondary | ICD-10-CM | POA: Diagnosis not present

## 2023-03-03 DIAGNOSIS — C7951 Secondary malignant neoplasm of bone: Secondary | ICD-10-CM | POA: Diagnosis not present

## 2023-03-03 DIAGNOSIS — E611 Iron deficiency: Secondary | ICD-10-CM | POA: Diagnosis not present

## 2023-03-03 DIAGNOSIS — Z5112 Encounter for antineoplastic immunotherapy: Secondary | ICD-10-CM | POA: Diagnosis not present

## 2023-03-03 DIAGNOSIS — D0511 Intraductal carcinoma in situ of right breast: Secondary | ICD-10-CM | POA: Diagnosis not present

## 2023-03-06 DIAGNOSIS — M62838 Other muscle spasm: Secondary | ICD-10-CM | POA: Diagnosis not present

## 2023-03-06 DIAGNOSIS — G894 Chronic pain syndrome: Secondary | ICD-10-CM | POA: Diagnosis not present

## 2023-03-11 IMAGING — US US ABDOMEN COMPLETE
1 series · 14 of 25 positions shown · non-contrast
Comparison: None.

CLINICAL DATA: Abnormal LFTs.  Prior cholecystectomy.

EXAM:
ABDOMEN ULTRASOUND COMPLETE

[Series 1: us abdomen complete · 14 of 111 slices shown]
[im 1/111]
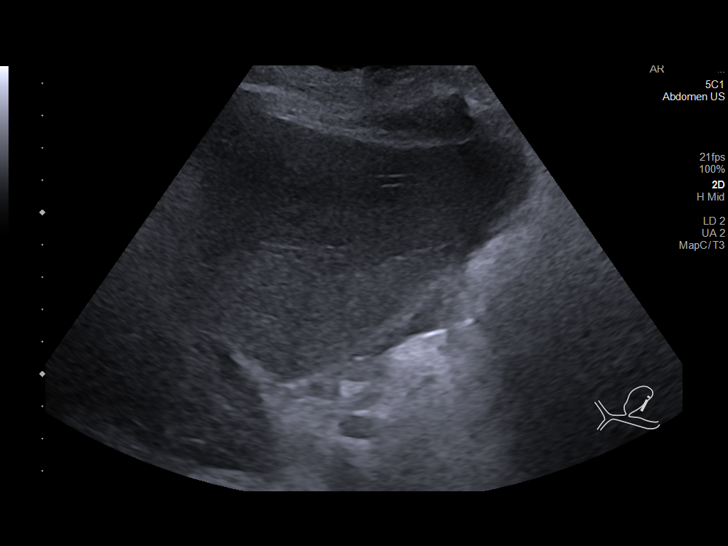
[im 10/111]
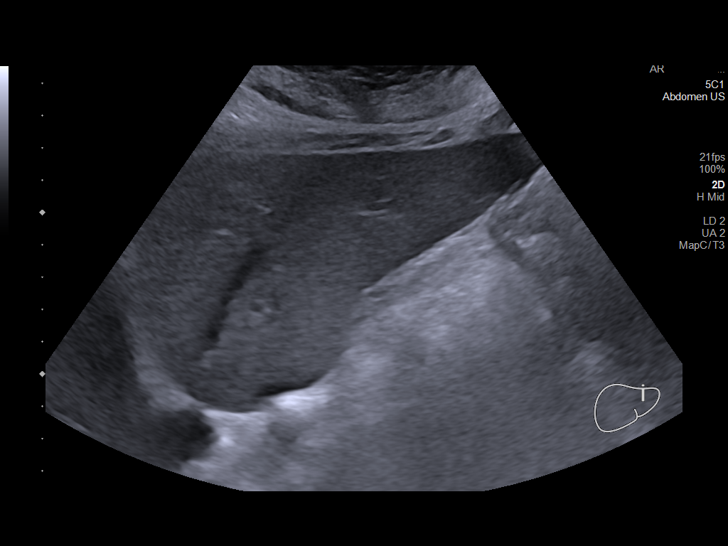
[im 19/111]
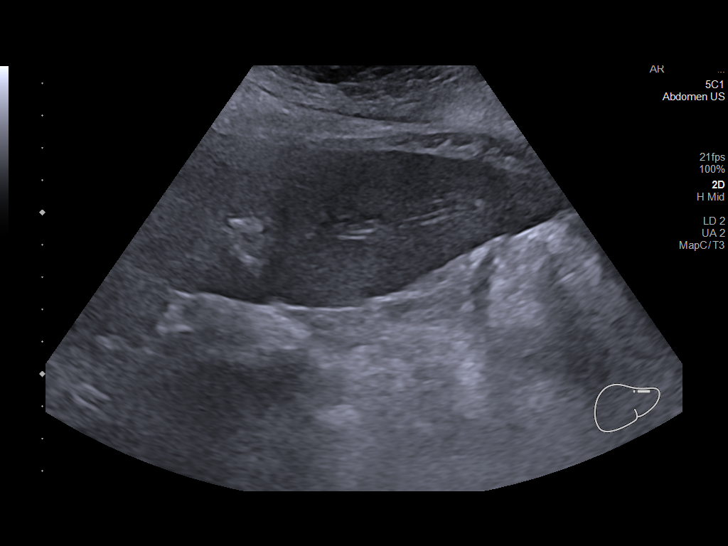
[im 28/111]
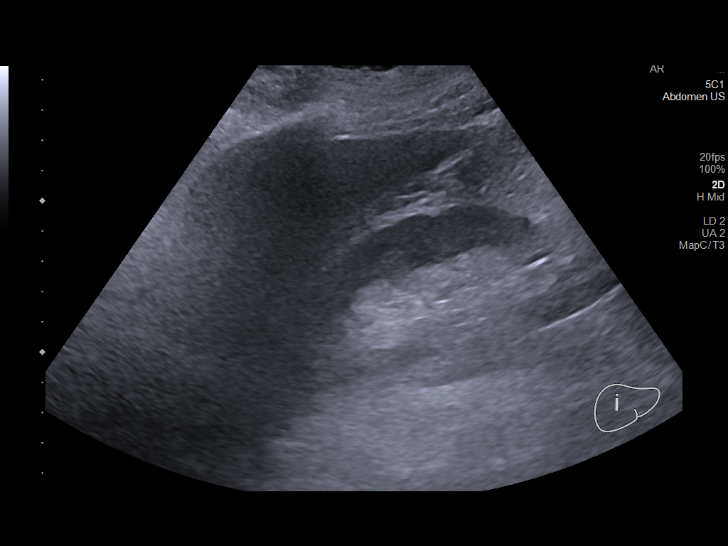
[im 37/111]
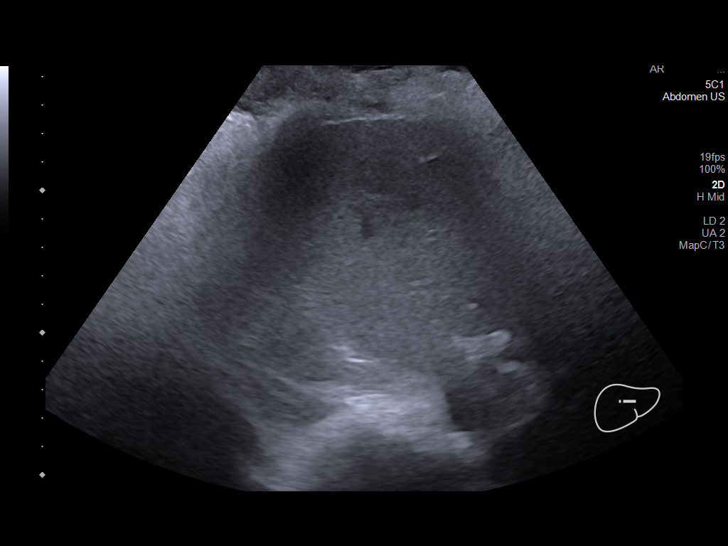
[im 42/111]
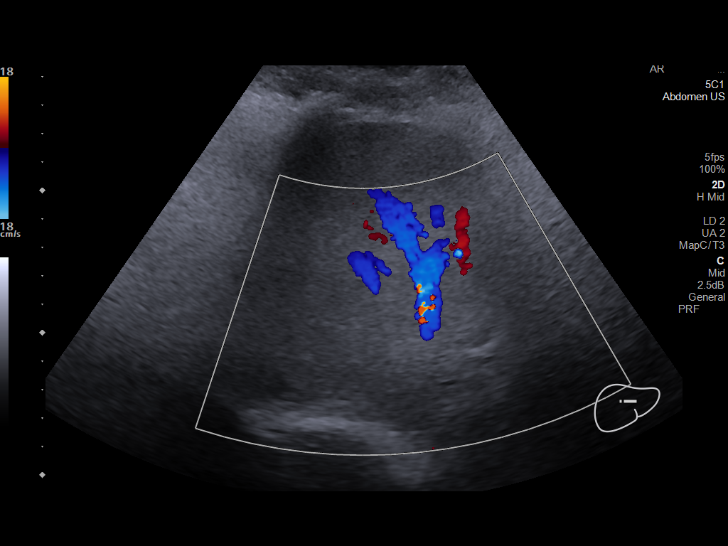
[im 51/111]
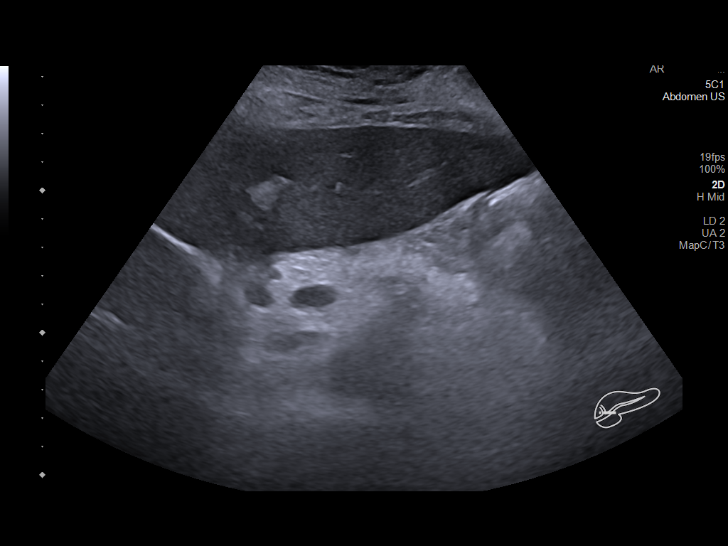
[im 60/111]
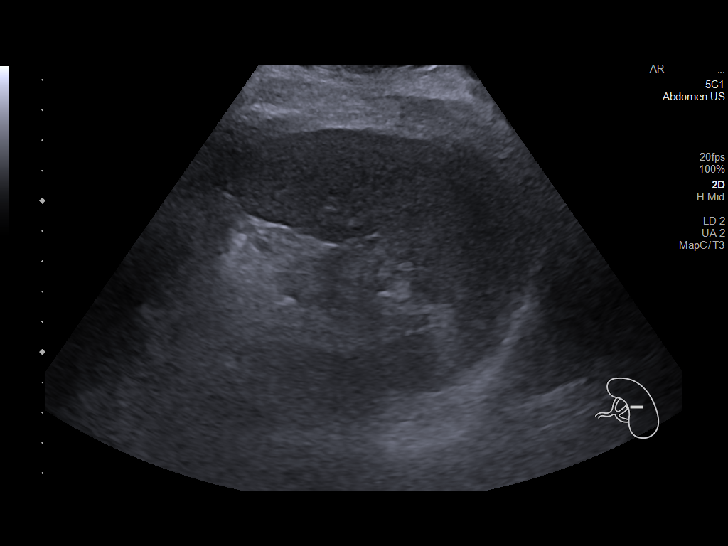
[im 69/111]
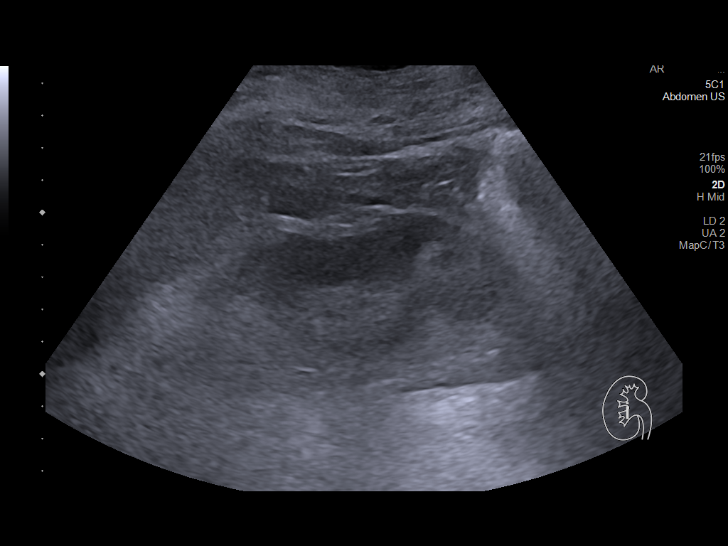
[im 74/111]
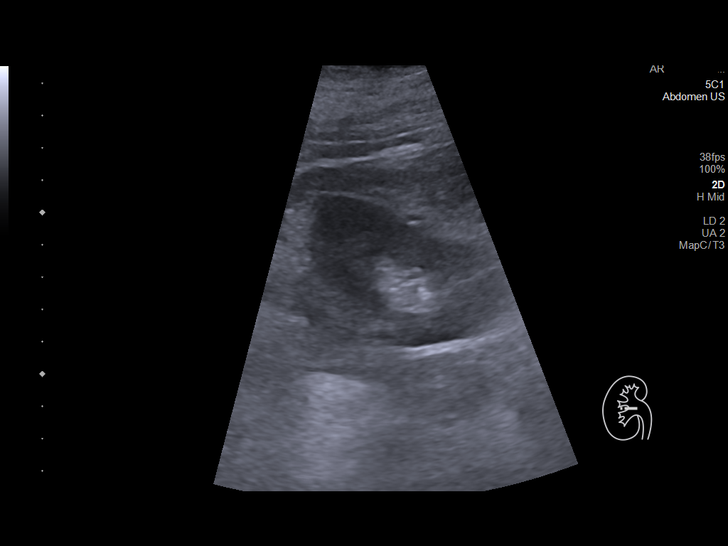
[im 83/111]
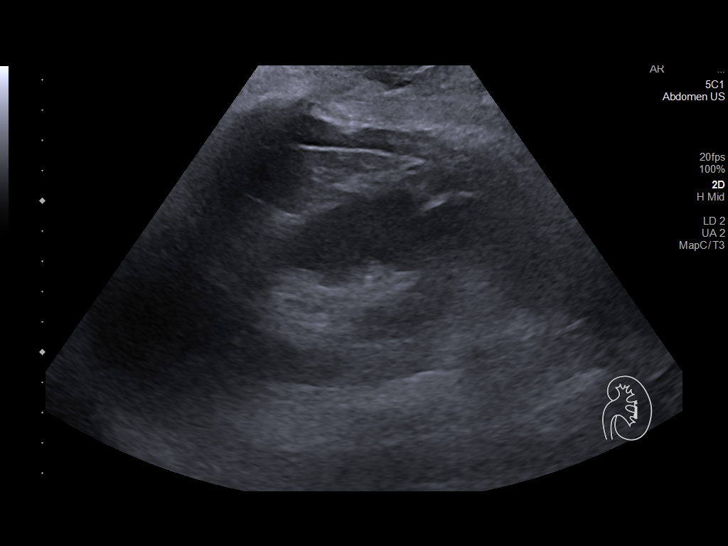
[im 92/111]
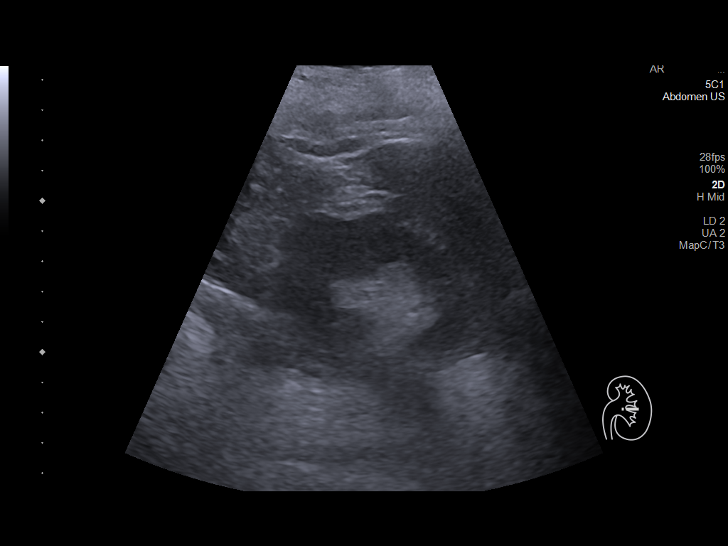
[im 101/111]
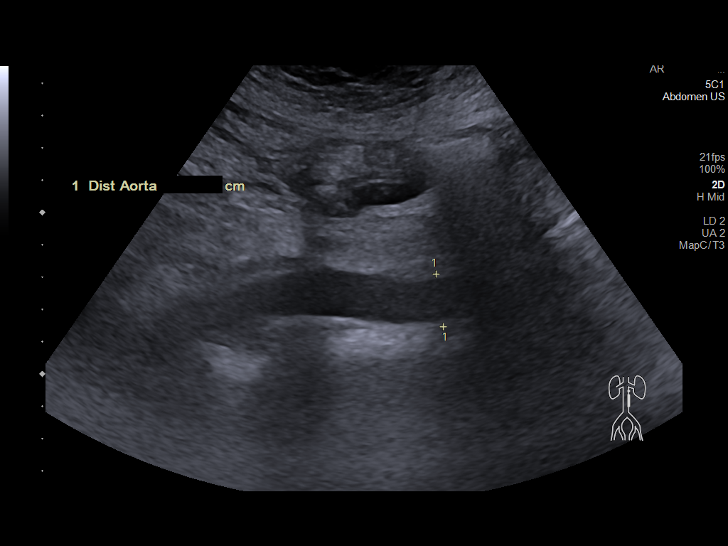
[im 111/111]
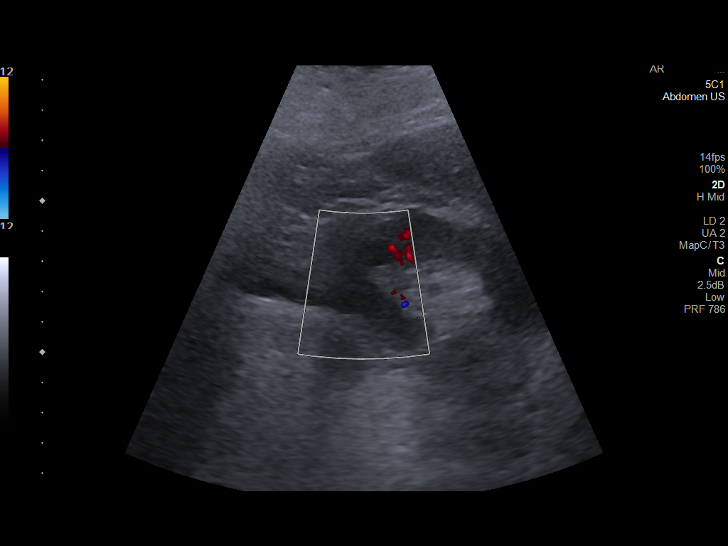

[14 of 25 positions shown; findings below may reference images not displayed]

FINDINGS: Gallbladder: Surgically absent

Common bile duct: Diameter: 4 mm

Liver: Increased echogenicity. No focal lesion. Portal vein is
patent on color Doppler imaging with normal direction of blood flow
towards the liver.

IVC: No abnormality visualized.

Pancreas: Not well visualized due to overlying bowel gas.

Spleen: Size and appearance within normal limits.

Right Kidney: Length: 12.7 cm. Echogenicity within normal limits. No
mass or hydronephrosis visualized. Poorly visualized 1.3 cm
hypoechoic mass, favored to represent a cyst however incompletely
characterized.

Left Kidney: Length: 12.5 cm. Echogenicity within normal limits. No
mass or hydronephrosis visualized.

Abdominal aorta: No aneurysm visualized.

Other findings: None.
IMPRESSION: Prior cholecystectomy.  No acute process.

Increased hepatic parenchymal echogenicity suggestive of steatosis.

## 2023-03-12 ENCOUNTER — Ambulatory Visit (HOSPITAL_COMMUNITY): Payer: 59

## 2023-03-16 DIAGNOSIS — I10 Essential (primary) hypertension: Secondary | ICD-10-CM | POA: Diagnosis not present

## 2023-03-16 DIAGNOSIS — L089 Local infection of the skin and subcutaneous tissue, unspecified: Secondary | ICD-10-CM | POA: Diagnosis not present

## 2023-03-16 DIAGNOSIS — T8133XD Disruption of traumatic injury wound repair, subsequent encounter: Secondary | ICD-10-CM | POA: Diagnosis not present

## 2023-03-16 DIAGNOSIS — T148XXA Other injury of unspecified body region, initial encounter: Secondary | ICD-10-CM | POA: Diagnosis not present

## 2023-03-16 DIAGNOSIS — C50912 Malignant neoplasm of unspecified site of left female breast: Secondary | ICD-10-CM | POA: Diagnosis not present

## 2023-03-16 DIAGNOSIS — T8133XA Disruption of traumatic injury wound repair, initial encounter: Secondary | ICD-10-CM | POA: Diagnosis not present

## 2023-03-16 DIAGNOSIS — C773 Secondary and unspecified malignant neoplasm of axilla and upper limb lymph nodes: Secondary | ICD-10-CM | POA: Diagnosis not present

## 2023-03-19 ENCOUNTER — Ambulatory Visit: Payer: Medicaid Other | Admitting: Family Medicine

## 2023-03-24 ENCOUNTER — Ambulatory Visit (HOSPITAL_COMMUNITY): Payer: 59

## 2023-03-24 DIAGNOSIS — C50912 Malignant neoplasm of unspecified site of left female breast: Secondary | ICD-10-CM | POA: Diagnosis not present

## 2023-03-24 DIAGNOSIS — L981 Factitial dermatitis: Secondary | ICD-10-CM | POA: Diagnosis not present

## 2023-03-24 DIAGNOSIS — R7309 Other abnormal glucose: Secondary | ICD-10-CM | POA: Diagnosis not present

## 2023-03-24 DIAGNOSIS — R030 Elevated blood-pressure reading, without diagnosis of hypertension: Secondary | ICD-10-CM | POA: Diagnosis not present

## 2023-03-24 DIAGNOSIS — G4700 Insomnia, unspecified: Secondary | ICD-10-CM | POA: Diagnosis not present

## 2023-03-24 DIAGNOSIS — Z1211 Encounter for screening for malignant neoplasm of colon: Secondary | ICD-10-CM | POA: Diagnosis not present

## 2023-03-24 DIAGNOSIS — M545 Low back pain, unspecified: Secondary | ICD-10-CM | POA: Diagnosis not present

## 2023-03-24 DIAGNOSIS — Z9181 History of falling: Secondary | ICD-10-CM | POA: Diagnosis not present

## 2023-03-30 ENCOUNTER — Ambulatory Visit: Payer: Medicaid Other | Admitting: Family Medicine

## 2023-03-31 ENCOUNTER — Ambulatory Visit: Payer: 59 | Admitting: Family Medicine

## 2023-04-01 ENCOUNTER — Encounter: Payer: Self-pay | Admitting: Family Medicine

## 2023-04-03 DIAGNOSIS — C50912 Malignant neoplasm of unspecified site of left female breast: Secondary | ICD-10-CM | POA: Diagnosis not present

## 2023-04-03 DIAGNOSIS — C773 Secondary and unspecified malignant neoplasm of axilla and upper limb lymph nodes: Secondary | ICD-10-CM | POA: Diagnosis not present

## 2023-04-03 DIAGNOSIS — D0511 Intraductal carcinoma in situ of right breast: Secondary | ICD-10-CM | POA: Diagnosis not present

## 2023-04-03 DIAGNOSIS — Z5112 Encounter for antineoplastic immunotherapy: Secondary | ICD-10-CM | POA: Diagnosis not present

## 2023-04-03 DIAGNOSIS — C7951 Secondary malignant neoplasm of bone: Secondary | ICD-10-CM | POA: Diagnosis not present

## 2023-04-06 DIAGNOSIS — M62838 Other muscle spasm: Secondary | ICD-10-CM | POA: Diagnosis not present

## 2023-04-06 DIAGNOSIS — G894 Chronic pain syndrome: Secondary | ICD-10-CM | POA: Diagnosis not present

## 2023-04-07 ENCOUNTER — Ambulatory Visit (HOSPITAL_COMMUNITY): Payer: 59

## 2023-04-10 DIAGNOSIS — R748 Abnormal levels of other serum enzymes: Secondary | ICD-10-CM | POA: Diagnosis not present

## 2023-04-13 DIAGNOSIS — L089 Local infection of the skin and subcutaneous tissue, unspecified: Secondary | ICD-10-CM | POA: Diagnosis not present

## 2023-04-13 DIAGNOSIS — I10 Essential (primary) hypertension: Secondary | ICD-10-CM | POA: Diagnosis not present

## 2023-04-13 DIAGNOSIS — T8133XD Disruption of traumatic injury wound repair, subsequent encounter: Secondary | ICD-10-CM | POA: Diagnosis not present

## 2023-04-13 DIAGNOSIS — C50912 Malignant neoplasm of unspecified site of left female breast: Secondary | ICD-10-CM | POA: Diagnosis not present

## 2023-04-13 DIAGNOSIS — T8133XA Disruption of traumatic injury wound repair, initial encounter: Secondary | ICD-10-CM | POA: Diagnosis not present

## 2023-04-13 DIAGNOSIS — C773 Secondary and unspecified malignant neoplasm of axilla and upper limb lymph nodes: Secondary | ICD-10-CM | POA: Diagnosis not present

## 2023-04-13 DIAGNOSIS — T148XXA Other injury of unspecified body region, initial encounter: Secondary | ICD-10-CM | POA: Diagnosis not present

## 2023-04-16 ENCOUNTER — Other Ambulatory Visit: Payer: Self-pay | Admitting: Family Medicine

## 2023-04-16 DIAGNOSIS — I4811 Longstanding persistent atrial fibrillation: Secondary | ICD-10-CM

## 2023-04-17 NOTE — Patient Instructions (Signed)
Our records indicate that you are due for your screening mammogram.  Please call the imaging center that does your yearly mammograms to make an appointment for a mammogram at your earliest convenience. Our office also has a mobile unit through the [facility] that comes to our location. Please call our office if you would like to make an appointment.   

## 2023-04-20 ENCOUNTER — Ambulatory Visit (INDEPENDENT_AMBULATORY_CARE_PROVIDER_SITE_OTHER): Payer: 59 | Admitting: Family Medicine

## 2023-04-20 ENCOUNTER — Encounter: Payer: Self-pay | Admitting: Family Medicine

## 2023-04-20 VITALS — BP 105/49 | HR 84 | Temp 97.3°F | Ht 63.0 in | Wt 187.6 lb

## 2023-04-20 DIAGNOSIS — E611 Iron deficiency: Secondary | ICD-10-CM | POA: Diagnosis not present

## 2023-04-20 DIAGNOSIS — E785 Hyperlipidemia, unspecified: Secondary | ICD-10-CM | POA: Diagnosis not present

## 2023-04-20 DIAGNOSIS — N393 Stress incontinence (female) (male): Secondary | ICD-10-CM

## 2023-04-20 DIAGNOSIS — E118 Type 2 diabetes mellitus with unspecified complications: Secondary | ICD-10-CM

## 2023-04-20 DIAGNOSIS — I10 Essential (primary) hypertension: Secondary | ICD-10-CM

## 2023-04-20 LAB — BAYER DCA HB A1C WAIVED: HB A1C (BAYER DCA - WAIVED): 6 % — ABNORMAL HIGH (ref 4.8–5.6)

## 2023-04-20 NOTE — Progress Notes (Signed)
Subjective:  Patient ID: Diana Mathews, female    DOB: [birth year]  Age: 72 y.o. MRN: [id number]  CC: Medical Management of Chronic Issues   HPI Diana Mathews presents forFollow-up of diabetes. Patient checks blood sugar at home.   1oo fasting and 150 postprandial Patient denies symptoms such as polyuria, polydipsia, excessive hunger, nausea No significant hypoglycemic spells noted. Medications reviewed. Pt reports taking them regularly without complication/adverse reaction being reported today.   Due for DEXA, mammogram. Plans to get them soon in [place]. Has had breast CA. Also due colonoscopy, but states she had cologuard 2 years ago that was negative.  History Diana Mathews has a past medical history of Atrial fibrillation (HCC) (06/12/2018), Depression, Essential hypertension, Headache, Hyperlipidemia, Metastatic breast cancer, Osteoarthritis, Sleep apnea, TIA (transient ischemic attack), and Type 2 diabetes mellitus (HCC).   She has a past surgical history that includes Abdominal hysterectomy; Wrist surgery (Bilateral); Hernia repair; Cholecystectomy; Tubal ligation; and Foot surgery (Left).   Her family history includes Arthritis in her mother, sister, and sister; COPD in her mother; Diabetes in her brother, brother, mother, sister, and sister; Hypertension in her mother; Mental illness in her brother.She reports that she quit smoking about 38 years ago. Her smoking use included cigarettes. She has never used smokeless tobacco. She reports that she does not drink alcohol and does not use drugs.  Current Outpatient Medications on File Prior to Visit  Medication Sig Dispense Refill   Alcohol Swabs (B-D SINGLE USE SWABS REGULAR) PADS Test BS QID Dx E11.8 400 each 3   ARIPiprazole (ABILIFY) 5 MG tablet TAKE ONE (1) TABLET EACH DAY 90 tablet 3   atorvastatin (LIPITOR) 80 MG tablet TAKE ONE (1) TABLET EACH DAY 90 tablet 2   baclofen (LIORESAL) 10 MG tablet Take 10 mg by mouth 4 (four)  times daily.     Blood Glucose Monitoring Suppl (TRUE METRIX AIR GLUCOSE METER) w/Device KIT Test BS QID Dx E11.8 1 kit 0   busPIRone (BUSPAR) 10 MG tablet Take 1 tablet (10 mg total) by mouth 3 (three) times daily as needed. 270 tablet 3   carvedilol (COREG) 6.25 MG tablet Take 1 tablet (6.25 mg total) by mouth 2 (two) times daily with a meal. 180 tablet 3   diltiazem (CARDIZEM CD) 240 MG 24 hr capsule Take 1 capsule (240 mg total) by mouth 2 (two) times daily. 180 capsule 3   diphenhydrAMINE (BENADRYL) 25 mg capsule Take 25 mg by mouth at bedtime as needed.     DULoxetine (CYMBALTA) 60 MG capsule TAKE 1 CAPSULE EVERY MORNING AND TAKE 1 CAPSULE AT BEDTIME 180 capsule 3   ELIQUIS 5 MG TABS tablet TAKE ONE (1) TABLET BY MOUTH TWO (2) TIMES DAILY 180 tablet 3   glucose blood (TRUE METRIX BLOOD GLUCOSE TEST) test strip Test BS QID Dx E11.8 400 each 3   HYDROcodone-acetaminophen (NORCO) 10-325 MG tablet Take 1 tablet by mouth every 6 (six) hours as needed.     letrozole (FEMARA) 2.5 MG tablet Take 2.5 mg by mouth daily.     metFORMIN (GLUCOPHAGE-XR) 750 MG 24 hr tablet TAKE ONE (1) TABLET BY MOUTH TWO (2) TIMES DAILY 200 tablet 1   OZEMPIC, 0.25 OR 0.5 MG/DOSE, 2 MG/3ML SOPN INJECT 0.5MG  INTO THE SKIN ONCE A WEEK 9 mL 0   palbociclib (IBRANCE) 125 MG tablet Take by mouth.     rOPINIRole (REQUIP) 1 MG tablet Take 1 tablet (1 mg total) by mouth at bedtime. 90 tablet  3   traZODone (DESYREL) 150 MG tablet Use from 1/3 to 1 tablet nightly as needed for sleep. 30 tablet 5   TRUEplus Lancets 33G MISC Test BS QID Dx E11.8 400 each 3   No current facility-administered medications on file prior to visit.    ROS Review of Systems  Constitutional: Negative.   HENT:  Negative for congestion.   Eyes:  Negative for visual disturbance.  Respiratory:  Negative for shortness of breath.   Cardiovascular:  Negative for chest pain.  Gastrointestinal:  Negative for abdominal pain, constipation, diarrhea, nausea  and vomiting.  Genitourinary:  Positive for frequency (loss of urine with cough, laugh, bendin over). Negative for difficulty urinating.  Musculoskeletal:  Negative for arthralgias and myalgias.  Neurological:  Negative for headaches.  Psychiatric/Behavioral:  Negative for sleep disturbance.     Objective:  BP (!) 105/49   Pulse 84   Temp (!) 97.3 F (36.3 C)   Ht 5\' 3"  (1.6 m)   Wt 187 lb 9.6 oz (85.1 kg)   SpO2 96%   BMI 33.23 kg/m   BP Readings from Last 3 Encounters:  04/20/23 (!) 105/49  12/17/22 134/74  09/16/22 (!) 157/81    Wt Readings from Last 3 Encounters:  04/20/23 187 lb 9.6 oz (85.1 kg)  12/17/22 198 lb (89.8 kg)  09/16/22 214 lb 12.8 oz (97.4 kg)     Physical Exam Constitutional:      General: She is not in acute distress.    Appearance: She is well-developed.  Cardiovascular:     Rate and Rhythm: Normal rate and regular rhythm.  Pulmonary:     Breath sounds: Normal breath sounds.  Musculoskeletal:        General: Normal range of motion.  Skin:    General: Skin is warm and dry.  Neurological:     Mental Status: She is alert and oriented to person, place, and time.     Diabetic Foot Exam - Simple   Simple Foot Form Diabetic Foot exam was performed with the following findings: Yes 04/20/2023 10:56 AM  Visual Inspection No deformities, no ulcerations, no other skin breakdown bilaterally: Yes Sensation Testing Intact to touch and monofilament testing bilaterally: Yes Pulse Check Posterior Tibialis and Dorsalis pulse intact bilaterally: Yes Comments      Assessment & Plan:   Diana Mathews was seen today for medical management of chronic issues.  Diagnoses and all orders for this visit:  Essential hypertension -     CBC with Differential/Platelet -     CMP14+EGFR  Controlled type 2 diabetes mellitus with complication, without long-term current use of insulin (HCC) -     Bayer DCA Hb A1c Waived -     Microalbumin / creatinine urine  ratio  Hyperlipidemia, unspecified hyperlipidemia type -     Lipid panel  Stress incontinence      I have discontinued Diana Mathews's mupirocin ointment and mometasone. I am also having her maintain her diphenhydrAMINE, HYDROcodone-acetaminophen, palbociclib, True Metrix Air Glucose Meter, True Metrix Blood Glucose Test, TRUEplus Lancets 33G, B-D SINGLE USE SWABS REGULAR, letrozole, baclofen, ARIPiprazole, atorvastatin, busPIRone, diltiazem, DULoxetine, rOPINIRole, carvedilol, traZODone, metFORMIN, Ozempic (0.25 or 0.5 MG/DOSE), and Eliquis.  No orders of the defined types were placed in this encounter.    Follow-up: Return in about 3 months (around 07/21/2023).  Mechele Claude, M.D.

## 2023-04-21 DIAGNOSIS — R748 Abnormal levels of other serum enzymes: Secondary | ICD-10-CM | POA: Diagnosis not present

## 2023-04-21 LAB — CBC WITH DIFFERENTIAL/PLATELET
Basophils Absolute: 0.1 10*3/uL (ref 0.0–0.2)
Basos: 2 %
EOS (ABSOLUTE): 0.2 10*3/uL (ref 0.0–0.4)
Eos: 4 %
Hematocrit: 35.9 % (ref 34.0–46.6)
Hemoglobin: 12.4 g/dL (ref 11.1–15.9)
Immature Grans (Abs): 0 10*3/uL (ref 0.0–0.1)
Immature Granulocytes: 0 %
Lymphocytes Absolute: 1.4 10*3/uL (ref 0.7–3.1)
Lymphs: 39 %
MCH: 35.5 pg — ABNORMAL HIGH (ref 26.6–33.0)
MCHC: 34.5 g/dL (ref 31.5–35.7)
MCV: 103 fL — ABNORMAL HIGH (ref 79–97)
Monocytes Absolute: 0.4 10*3/uL (ref 0.1–0.9)
Monocytes: 11 %
Neutrophils Absolute: 1.6 10*3/uL (ref 1.4–7.0)
Neutrophils: 44 %
Platelets: 176 10*3/uL (ref 150–450)
RBC: 3.49 x10E6/uL — ABNORMAL LOW (ref 3.77–5.28)
RDW: 14.6 % (ref 11.7–15.4)
WBC: 3.6 10*3/uL (ref 3.4–10.8)

## 2023-04-21 LAB — LIPID PANEL
Chol/HDL Ratio: 1.7 ratio (ref 0.0–4.4)
Cholesterol, Total: 119 mg/dL (ref 100–199)
HDL: 71 mg/dL (ref >39)
LDL Chol Calc (NIH): 33 mg/dL (ref 0–99)
Triglycerides: 74 mg/dL (ref 0–149)
VLDL Cholesterol Cal: 15 mg/dL (ref 5–40)

## 2023-04-21 LAB — CMP14+EGFR
ALT: 84 IU/L — ABNORMAL HIGH (ref 0–32)
AST: 84 IU/L — ABNORMAL HIGH (ref 0–40)
Albumin: 4.1 g/dL (ref 3.9–4.9)
Alkaline Phosphatase: 154 IU/L — ABNORMAL HIGH (ref 44–121)
BUN/Creatinine Ratio: 20 (ref 12–28)
BUN: 12 mg/dL (ref 8–27)
Bilirubin Total: 0.4 mg/dL (ref 0.0–1.2)
CO2: 22 mmol/L (ref 20–29)
Calcium: 9.4 mg/dL (ref 8.7–10.3)
Chloride: 101 mmol/L (ref 96–106)
Creatinine, Ser: 0.61 mg/dL (ref 0.57–1.00)
Globulin, Total: 2.9 g/dL (ref 1.5–4.5)
Glucose: 166 mg/dL — ABNORMAL HIGH (ref 70–99)
Potassium: 4.1 mmol/L (ref 3.5–5.2)
Sodium: 138 mmol/L (ref 134–144)
Total Protein: 7 g/dL (ref 6.0–8.5)
eGFR: 97 mL/min/{1.73_m2} (ref >59)

## 2023-04-21 LAB — MICROALBUMIN / CREATININE URINE RATIO
Creatinine, Urine: 191.2 mg/dL
Microalb/Creat Ratio: 21 mg/g creat (ref 0–29)
Microalbumin, Urine: 40.4 ug/mL

## 2023-04-23 ENCOUNTER — Ambulatory Visit (HOSPITAL_COMMUNITY)
Admission: RE | Admit: 2023-04-23 | Discharge: 2023-04-23 | Disposition: A | Discharge status: Home / Self Care | Payer: 59 | Source: Ambulatory Visit | Attending: Internal Medicine | Admitting: Internal Medicine

## 2023-04-23 DIAGNOSIS — R7989 Other specified abnormal findings of blood chemistry: Secondary | ICD-10-CM | POA: Diagnosis not present

## 2023-04-23 DIAGNOSIS — R748 Abnormal levels of other serum enzymes: Secondary | ICD-10-CM | POA: Diagnosis not present

## 2023-04-23 DIAGNOSIS — Z9049 Acquired absence of other specified parts of digestive tract: Secondary | ICD-10-CM | POA: Diagnosis not present

## 2023-04-24 LAB — VITAMIN B12

## 2023-04-25 LAB — SPECIMEN STATUS REPORT

## 2023-04-25 LAB — FOLATE: Folate: >20 ng/mL (ref >3.0)

## 2023-04-28 ENCOUNTER — Other Ambulatory Visit: Payer: Self-pay | Admitting: Family Medicine

## 2023-04-28 ENCOUNTER — Telehealth: Payer: Self-pay | Admitting: Family Medicine

## 2023-04-28 MED ORDER — BLOOD GLUCOSE MONITORING SUPPL DEVI: 1.0000 | Freq: Three times a day (TID) | 0 refills | Status: AC

## 2023-04-28 MED ORDER — BLOOD GLUCOSE TEST VI STRP: 1.0000 | ORAL_STRIP | Freq: Three times a day (TID) | 0 refills | Status: DC

## 2023-04-28 MED ORDER — LANCET DEVICE MISC: 1.0000 | Freq: Three times a day (TID) | 0 refills | Status: AC

## 2023-04-28 MED ORDER — BD SWAB SINGLE USE REGULAR PADS: MEDICATED_PAD | 3 refills | Status: DC

## 2023-04-28 MED ORDER — LANCETS MISC. MISC: 1.0000 | Freq: Three times a day (TID) | 0 refills | Status: AC

## 2023-04-28 NOTE — Telephone Encounter (Signed)
Pts accu check broke and she is requesting that Dr Darlyn Read [facility] [place]. Will also needs lancets and test strips.

## 2023-04-28 NOTE — Telephone Encounter (Signed)
Please let the patient know that I sent their prescription to their pharmacy. Thanks, WS 

## 2023-04-29 NOTE — Telephone Encounter (Signed)
Patient notified

## 2023-05-01 DIAGNOSIS — E611 Iron deficiency: Secondary | ICD-10-CM | POA: Diagnosis not present

## 2023-05-01 DIAGNOSIS — D0511 Intraductal carcinoma in situ of right breast: Secondary | ICD-10-CM | POA: Diagnosis not present

## 2023-05-01 DIAGNOSIS — C50912 Malignant neoplasm of unspecified site of left female breast: Secondary | ICD-10-CM | POA: Diagnosis not present

## 2023-05-01 DIAGNOSIS — Z5112 Encounter for antineoplastic immunotherapy: Secondary | ICD-10-CM | POA: Diagnosis not present

## 2023-05-01 DIAGNOSIS — C7951 Secondary malignant neoplasm of bone: Secondary | ICD-10-CM | POA: Diagnosis not present

## 2023-05-01 DIAGNOSIS — C773 Secondary and unspecified malignant neoplasm of axilla and upper limb lymph nodes: Secondary | ICD-10-CM | POA: Diagnosis not present

## 2023-05-06 DIAGNOSIS — G894 Chronic pain syndrome: Secondary | ICD-10-CM | POA: Diagnosis not present

## 2023-05-06 DIAGNOSIS — M62838 Other muscle spasm: Secondary | ICD-10-CM | POA: Diagnosis not present

## 2023-05-15 ENCOUNTER — Other Ambulatory Visit: Payer: Self-pay | Admitting: Family Medicine

## 2023-05-18 DIAGNOSIS — C773 Secondary and unspecified malignant neoplasm of axilla and upper limb lymph nodes: Secondary | ICD-10-CM | POA: Diagnosis not present

## 2023-05-18 DIAGNOSIS — I10 Essential (primary) hypertension: Secondary | ICD-10-CM | POA: Diagnosis not present

## 2023-05-18 DIAGNOSIS — T148XXA Other injury of unspecified body region, initial encounter: Secondary | ICD-10-CM | POA: Diagnosis not present

## 2023-05-18 DIAGNOSIS — C50912 Malignant neoplasm of unspecified site of left female breast: Secondary | ICD-10-CM | POA: Diagnosis not present

## 2023-05-18 DIAGNOSIS — T8133XD Disruption of traumatic injury wound repair, subsequent encounter: Secondary | ICD-10-CM | POA: Diagnosis not present

## 2023-05-18 DIAGNOSIS — T8133XA Disruption of traumatic injury wound repair, initial encounter: Secondary | ICD-10-CM | POA: Diagnosis not present

## 2023-05-18 DIAGNOSIS — E118 Type 2 diabetes mellitus with unspecified complications: Secondary | ICD-10-CM | POA: Diagnosis not present

## 2023-05-18 DIAGNOSIS — L089 Local infection of the skin and subcutaneous tissue, unspecified: Secondary | ICD-10-CM | POA: Diagnosis not present

## 2023-05-25 DIAGNOSIS — M545 Low back pain, unspecified: Secondary | ICD-10-CM | POA: Diagnosis not present

## 2023-05-25 DIAGNOSIS — R7309 Other abnormal glucose: Secondary | ICD-10-CM | POA: Diagnosis not present

## 2023-05-25 DIAGNOSIS — G8929 Other chronic pain: Secondary | ICD-10-CM | POA: Diagnosis not present

## 2023-05-25 DIAGNOSIS — Z9181 History of falling: Secondary | ICD-10-CM | POA: Diagnosis not present

## 2023-05-25 DIAGNOSIS — Z79899 Other long term (current) drug therapy: Secondary | ICD-10-CM | POA: Diagnosis not present

## 2023-05-25 DIAGNOSIS — R030 Elevated blood-pressure reading, without diagnosis of hypertension: Secondary | ICD-10-CM | POA: Diagnosis not present

## 2023-05-25 DIAGNOSIS — C50912 Malignant neoplasm of unspecified site of left female breast: Secondary | ICD-10-CM | POA: Diagnosis not present

## 2023-05-25 DIAGNOSIS — L981 Factitial dermatitis: Secondary | ICD-10-CM | POA: Diagnosis not present

## 2023-05-25 DIAGNOSIS — G4700 Insomnia, unspecified: Secondary | ICD-10-CM | POA: Diagnosis not present

## 2023-05-25 DIAGNOSIS — R7401 Elevation of levels of liver transaminase levels: Secondary | ICD-10-CM | POA: Diagnosis not present

## 2023-05-27 DIAGNOSIS — Z79899 Other long term (current) drug therapy: Secondary | ICD-10-CM | POA: Diagnosis not present

## 2023-06-01 ENCOUNTER — Other Ambulatory Visit: Payer: Self-pay | Admitting: Family Medicine

## 2023-06-02 DIAGNOSIS — C7951 Secondary malignant neoplasm of bone: Secondary | ICD-10-CM | POA: Diagnosis not present

## 2023-06-02 DIAGNOSIS — D701 Agranulocytosis secondary to cancer chemotherapy: Secondary | ICD-10-CM | POA: Diagnosis not present

## 2023-06-02 DIAGNOSIS — C773 Secondary and unspecified malignant neoplasm of axilla and upper limb lymph nodes: Secondary | ICD-10-CM | POA: Diagnosis not present

## 2023-06-02 DIAGNOSIS — Z79899 Other long term (current) drug therapy: Secondary | ICD-10-CM | POA: Diagnosis not present

## 2023-06-02 DIAGNOSIS — Z5181 Encounter for therapeutic drug level monitoring: Secondary | ICD-10-CM | POA: Diagnosis not present

## 2023-06-02 DIAGNOSIS — D0511 Intraductal carcinoma in situ of right breast: Secondary | ICD-10-CM | POA: Diagnosis not present

## 2023-06-02 DIAGNOSIS — T451X5A Adverse effect of antineoplastic and immunosuppressive drugs, initial encounter: Secondary | ICD-10-CM | POA: Diagnosis not present

## 2023-06-02 DIAGNOSIS — Z7983 Long term (current) use of bisphosphonates: Secondary | ICD-10-CM | POA: Diagnosis not present

## 2023-06-02 DIAGNOSIS — D509 Iron deficiency anemia, unspecified: Secondary | ICD-10-CM | POA: Diagnosis not present

## 2023-06-02 DIAGNOSIS — D84821 Immunodeficiency due to drugs: Secondary | ICD-10-CM | POA: Diagnosis not present

## 2023-06-02 DIAGNOSIS — R748 Abnormal levels of other serum enzymes: Secondary | ICD-10-CM | POA: Diagnosis not present

## 2023-06-02 DIAGNOSIS — Z780 Asymptomatic menopausal state: Secondary | ICD-10-CM | POA: Diagnosis not present

## 2023-06-02 DIAGNOSIS — Z7901 Long term (current) use of anticoagulants: Secondary | ICD-10-CM | POA: Diagnosis not present

## 2023-06-02 DIAGNOSIS — E876 Hypokalemia: Secondary | ICD-10-CM | POA: Diagnosis not present

## 2023-06-02 DIAGNOSIS — C50912 Malignant neoplasm of unspecified site of left female breast: Secondary | ICD-10-CM | POA: Diagnosis not present

## 2023-06-02 DIAGNOSIS — Z79811 Long term (current) use of aromatase inhibitors: Secondary | ICD-10-CM | POA: Diagnosis not present

## 2023-06-06 DIAGNOSIS — M62838 Other muscle spasm: Secondary | ICD-10-CM | POA: Diagnosis not present

## 2023-06-06 DIAGNOSIS — G894 Chronic pain syndrome: Secondary | ICD-10-CM | POA: Diagnosis not present

## 2023-06-08 DIAGNOSIS — C7951 Secondary malignant neoplasm of bone: Secondary | ICD-10-CM | POA: Diagnosis not present

## 2023-06-08 DIAGNOSIS — C773 Secondary and unspecified malignant neoplasm of axilla and upper limb lymph nodes: Secondary | ICD-10-CM | POA: Diagnosis not present

## 2023-06-08 DIAGNOSIS — D0511 Intraductal carcinoma in situ of right breast: Secondary | ICD-10-CM | POA: Diagnosis not present

## 2023-06-08 DIAGNOSIS — C50912 Malignant neoplasm of unspecified site of left female breast: Secondary | ICD-10-CM | POA: Diagnosis not present

## 2023-06-08 DIAGNOSIS — C50919 Malignant neoplasm of unspecified site of unspecified female breast: Secondary | ICD-10-CM | POA: Diagnosis not present

## 2023-06-09 ENCOUNTER — Encounter: Payer: Self-pay | Admitting: *Deleted

## 2023-06-14 ENCOUNTER — Other Ambulatory Visit: Payer: Self-pay | Admitting: Family Medicine

## 2023-06-23 DIAGNOSIS — C50912 Malignant neoplasm of unspecified site of left female breast: Secondary | ICD-10-CM | POA: Diagnosis not present

## 2023-06-23 DIAGNOSIS — G4700 Insomnia, unspecified: Secondary | ICD-10-CM | POA: Diagnosis not present

## 2023-06-23 DIAGNOSIS — E119 Type 2 diabetes mellitus without complications: Secondary | ICD-10-CM | POA: Diagnosis not present

## 2023-06-23 DIAGNOSIS — R030 Elevated blood-pressure reading, without diagnosis of hypertension: Secondary | ICD-10-CM | POA: Diagnosis not present

## 2023-06-23 DIAGNOSIS — E7800 Pure hypercholesterolemia, unspecified: Secondary | ICD-10-CM | POA: Diagnosis not present

## 2023-06-23 DIAGNOSIS — R7309 Other abnormal glucose: Secondary | ICD-10-CM | POA: Diagnosis not present

## 2023-06-23 DIAGNOSIS — Z9181 History of falling: Secondary | ICD-10-CM | POA: Diagnosis not present

## 2023-06-23 DIAGNOSIS — G8929 Other chronic pain: Secondary | ICD-10-CM | POA: Diagnosis not present

## 2023-06-23 DIAGNOSIS — M545 Low back pain, unspecified: Secondary | ICD-10-CM | POA: Diagnosis not present

## 2023-06-23 DIAGNOSIS — Z79899 Other long term (current) drug therapy: Secondary | ICD-10-CM | POA: Diagnosis not present

## 2023-06-23 DIAGNOSIS — L981 Factitial dermatitis: Secondary | ICD-10-CM | POA: Diagnosis not present

## 2023-06-24 DIAGNOSIS — C50912 Malignant neoplasm of unspecified site of left female breast: Secondary | ICD-10-CM | POA: Diagnosis not present

## 2023-06-24 DIAGNOSIS — L089 Local infection of the skin and subcutaneous tissue, unspecified: Secondary | ICD-10-CM | POA: Diagnosis not present

## 2023-06-24 DIAGNOSIS — I10 Essential (primary) hypertension: Secondary | ICD-10-CM | POA: Diagnosis not present

## 2023-06-24 DIAGNOSIS — T8133XD Disruption of traumatic injury wound repair, subsequent encounter: Secondary | ICD-10-CM | POA: Diagnosis not present

## 2023-06-24 DIAGNOSIS — T148XXA Other injury of unspecified body region, initial encounter: Secondary | ICD-10-CM | POA: Diagnosis not present

## 2023-06-24 DIAGNOSIS — T8133XA Disruption of traumatic injury wound repair, initial encounter: Secondary | ICD-10-CM | POA: Diagnosis not present

## 2023-06-24 DIAGNOSIS — E118 Type 2 diabetes mellitus with unspecified complications: Secondary | ICD-10-CM | POA: Diagnosis not present

## 2023-06-24 DIAGNOSIS — Z8673 Personal history of transient ischemic attack (TIA), and cerebral infarction without residual deficits: Secondary | ICD-10-CM | POA: Diagnosis not present

## 2023-06-24 DIAGNOSIS — C773 Secondary and unspecified malignant neoplasm of axilla and upper limb lymph nodes: Secondary | ICD-10-CM | POA: Diagnosis not present

## 2023-07-02 DIAGNOSIS — Z79899 Other long term (current) drug therapy: Secondary | ICD-10-CM | POA: Diagnosis not present

## 2023-07-02 DIAGNOSIS — T451X5A Adverse effect of antineoplastic and immunosuppressive drugs, initial encounter: Secondary | ICD-10-CM | POA: Diagnosis not present

## 2023-07-02 DIAGNOSIS — C50912 Malignant neoplasm of unspecified site of left female breast: Secondary | ICD-10-CM | POA: Diagnosis not present

## 2023-07-02 DIAGNOSIS — D84821 Immunodeficiency due to drugs: Secondary | ICD-10-CM | POA: Diagnosis not present

## 2023-07-02 DIAGNOSIS — E876 Hypokalemia: Secondary | ICD-10-CM | POA: Diagnosis not present

## 2023-07-02 DIAGNOSIS — D701 Agranulocytosis secondary to cancer chemotherapy: Secondary | ICD-10-CM | POA: Diagnosis not present

## 2023-07-02 DIAGNOSIS — E611 Iron deficiency: Secondary | ICD-10-CM | POA: Diagnosis not present

## 2023-07-02 DIAGNOSIS — D72820 Lymphocytosis (symptomatic): Secondary | ICD-10-CM | POA: Diagnosis not present

## 2023-07-02 DIAGNOSIS — E118 Type 2 diabetes mellitus with unspecified complications: Secondary | ICD-10-CM | POA: Diagnosis not present

## 2023-07-02 DIAGNOSIS — C7951 Secondary malignant neoplasm of bone: Secondary | ICD-10-CM | POA: Diagnosis not present

## 2023-07-02 DIAGNOSIS — Z8673 Personal history of transient ischemic attack (TIA), and cerebral infarction without residual deficits: Secondary | ICD-10-CM | POA: Diagnosis not present

## 2023-07-02 DIAGNOSIS — I10 Essential (primary) hypertension: Secondary | ICD-10-CM | POA: Diagnosis not present

## 2023-07-03 ENCOUNTER — Telehealth: Payer: Self-pay | Admitting: Family Medicine

## 2023-07-03 ENCOUNTER — Ambulatory Visit (INDEPENDENT_AMBULATORY_CARE_PROVIDER_SITE_OTHER): Payer: 59

## 2023-07-03 VITALS — Ht 63.0 in | Wt 186.0 lb

## 2023-07-03 DIAGNOSIS — Z0000 Encounter for general adult medical examination without abnormal findings: Secondary | ICD-10-CM | POA: Diagnosis not present

## 2023-07-03 DIAGNOSIS — Z780 Asymptomatic menopausal state: Secondary | ICD-10-CM

## 2023-07-03 NOTE — Telephone Encounter (Signed)
Patient would like to schedule a DEXA scan on the day that she is coming in to see PCP on 10/3

## 2023-07-03 NOTE — Telephone Encounter (Signed)
Appt made and patient aware.  

## 2023-07-03 NOTE — Progress Notes (Signed)
Subjective:   Diana Mathews is a 72 y.o. female who presents for Medicare Annual (Subsequent) preventive examination.  Visit Complete: Virtual  I connected with  LACRETIA DICIOCCIO on 07/03/23 by a audio enabled telemedicine application and verified that I am speaking with the correct person using two identifiers.  Patient Location: Home  Provider Location: Home Office  I discussed the limitations of evaluation and management by telemedicine. The patient expressed understanding and agreed to proceed.  Patient Medicare AWV questionnaire was completed by the patient on 07/03/2023; I have confirmed that all information answered by patient is correct and no changes since this date.  Review of Systems    Vital Signs: Unable to obtain new vitals due to this being a telehealth visit.    Nutrition Risk Assessment:  Has the patient had any N/V/D within the last 2 months?  No  Does the patient have any non-healing wounds?  No  Has the patient had any unintentional weight loss or weight gain?  No   Diabetes:  Is the patient diabetic?  Yes  If diabetic, was a CBG obtained today?  No  Did the patient bring in their glucometer from home?  No  How often do you monitor your CBG's? 3 x daily .   Financial Strains and Diabetes Management:  Are you having any financial strains with the device, your supplies or your medication? No .  Does the patient want to be seen by Chronic Care Management for management of their diabetes?  No  Would the patient like to be referred to a Nutritionist or for Diabetic Management?  No   Diabetic Exams:  Diabetic Eye Exam: Completed 06/2023 Diabetic Foot Exam: Overdue, Pt has been advised about the importance in completing this exam. Pt is scheduled for diabetic foot exam on next office visit .     Objective:    Today's Vitals   07/03/23 1301  Weight: 186 lb (84.4 kg)  Height: 5\' 3"  (1.6 m)   Body mass index is 32.95 kg/m.     07/03/2023    1:04 PM  05/12/2022    2:53 PM 05/10/2021    2:14 PM 01/11/2020   11:40 AM 01/10/2019   11:04 AM 06/30/2018    9:24 AM 06/14/2018    6:00 AM  Advanced Directives  Does Patient Have a Medical Advance Directive? No No No No No No No  Would patient like information on creating a medical advance directive? Yes (MAU/Ambulatory/Procedural Areas - Information given) No - Patient declined No - Patient declined No - Patient declined No - Patient declined  No - Patient declined    Current Medications (verified) Outpatient Encounter Medications as of 07/03/2023  Medication Sig   Alcohol Swabs (B-D SINGLE USE SWABS REGULAR) PADS Test BS QID Dx E11.8   ARIPiprazole (ABILIFY) 5 MG tablet TAKE ONE (1) TABLET EACH DAY   atorvastatin (LIPITOR) 80 MG tablet TAKE ONE (1) TABLET EACH DAY   baclofen (LIORESAL) 10 MG tablet Take 10 mg by mouth 4 (four) times daily.   Blood Glucose Monitoring Suppl DEVI 1 each by Does not apply route in the morning, at noon, and at bedtime. May substitute to any manufacturer covered by patient's insurance.   busPIRone (BUSPAR) 10 MG tablet Take 1 tablet (10 mg total) by mouth 3 (three) times daily as needed.   carvedilol (COREG) 6.25 MG tablet Take 1 tablet (6.25 mg total) by mouth 2 (two) times daily with a meal.   diltiazem (  CARDIZEM CD) 240 MG 24 hr capsule Take 1 capsule (240 mg total) by mouth 2 (two) times daily.   diphenhydrAMINE (BENADRYL) 25 mg capsule Take 25 mg by mouth at bedtime as needed.   DULoxetine (CYMBALTA) 60 MG capsule TAKE 1 CAPSULE EVERY MORNING AND TAKE 1 CAPSULE AT BEDTIME   ELIQUIS 5 MG TABS tablet TAKE ONE (1) TABLET BY MOUTH TWO (2) TIMES DAILY   glucose blood (ONETOUCH VERIO) test strip Test BS in the morning, at noon and at bedtime Dx E11.8   HYDROcodone-acetaminophen (NORCO) 10-325 MG tablet Take 1 tablet by mouth every 6 (six) hours as needed.   letrozole (FEMARA) 2.5 MG tablet Take 2.5 mg by mouth daily.   metFORMIN (GLUCOPHAGE-XR) 750 MG 24 hr tablet TAKE ONE  (1) TABLET BY MOUTH TWO (2) TIMES DAILY   OZEMPIC, 0.25 OR 0.5 MG/DOSE, 2 MG/3ML SOPN INJECT 0.5MG  INTO THE SKIN ONCE A WEEK   palbociclib (IBRANCE) 125 MG tablet Take by mouth.   rOPINIRole (REQUIP) 1 MG tablet Take 1 tablet (1 mg total) by mouth at bedtime.   traZODone (DESYREL) 150 MG tablet TAKE 1/3 TO 1 TABLET AT BEDTIME AS NEEDED FOR SLEEP   TRUEplus Lancets 33G MISC Test BS QID Dx E11.8   No facility-administered encounter medications on file as of 07/03/2023.    Allergies (verified) Codeine   History: Past Medical History:  Diagnosis Date   Atrial fibrillation (HCC) 06/12/2018   Depression    Essential hypertension    Headache    Hyperlipidemia    Metastatic breast cancer    Left breast, metastatic to bone and with pulmonary nodules - [facility]   Osteoarthritis    Sleep apnea    TIA (transient ischemic attack)    Type 2 diabetes mellitus (HCC)    Past Surgical History:  Procedure Laterality Date   ABDOMINAL HYSTERECTOMY     CHOLECYSTECTOMY     FOOT SURGERY Left    HERNIA REPAIR     TUBAL LIGATION     WRIST SURGERY Bilateral    Carpal Tunnel   Family History  Problem Relation Age of Onset   Arthritis Mother    COPD Mother    Diabetes Mother    Hypertension Mother    Arthritis Sister    Diabetes Brother    Arthritis Sister    Diabetes Sister    Diabetes Sister    Diabetes Brother    Mental illness Brother    Social History   Socioeconomic History   Marital status: Divorced    Spouse name: Not on file   Number of children: 3   Years of education: 13   Highest education level: High school graduate  Occupational History   Occupation: Retired  Tobacco Use   Smoking status: Former    Current packs/day: 0.00    Types: Cigarettes    Quit date: 01/09/1985    Years since quitting: 38.5   Smokeless tobacco: Never  Vaping Use   Vaping status: Never Used  Substance and Sexual Activity   Alcohol use: No    Alcohol/week: 0.0 standard drinks of  alcohol   Drug use: No   Sexual activity: Not Currently    Birth control/protection: Surgical  Other Topics Concern   Not on file  Social History Narrative   Lives alone. Children and family live nearby   Social Determinants of Health   Financial Resource Strain: Low Risk  (07/03/2023)   Overall Financial Resource Strain (CARDIA)  Difficulty of Paying Living Expenses: Not hard at all  Food Insecurity: No Food Insecurity (07/03/2023)   Hunger Vital Sign    Worried About Running Out of Food in the Last Year: Never true    Ran Out of Food in the Last Year: Never true  Transportation Needs: No Transportation Needs (07/03/2023)   PRAPARE - Administrator, Civil Service (Medical): No    Lack of Transportation (Non-Medical): No  Physical Activity: Sufficiently Active (07/03/2023)   Exercise Vital Sign    Days of Exercise per Week: 6 days    Minutes of Exercise per Session: 50 min  Stress: No Stress Concern Present (07/03/2023)   [employer] of Occupational Health - Occupational Stress Questionnaire    Feeling of Stress : Not at all  Social Connections: Socially Isolated (07/03/2023)   Social Connection and Isolation Panel [NHANES]    Frequency of Communication with Friends and Family: More than three times a week    Frequency of Social Gatherings with Friends and Family: More than three times a week    Attends Religious Services: Never    Database administrator or Organizations: No    Attends Engineer, structural: Never    Marital Status: Divorced    Tobacco Counseling Counseling given: Not Answered   Clinical Intake:  Pre-visit preparation completed: Yes  Pain : No/denies pain     Nutritional Risks: None Diabetes: Yes CBG done?: No Did pt. bring in CBG monitor from home?: No  How often do you need to have someone help you when you read instructions, pamphlets, or other written materials from your doctor or pharmacy?: 1 - Never  Interpreter Needed?:  No  Information entered by :: Renie Ora, LPN   Activities of Daily Living    06/18/2023    9:32 AM 05/10/2023   11:13 AM  In your present state of health, do you have any difficulty performing the following activities:  Hearing? 0 0  Vision? 1 1  Difficulty concentrating or making decisions? 0 0  Walking or climbing stairs? 1 1  Dressing or bathing? 0 0  Doing errands, shopping? 0 0  Preparing Food and eating ? N N  Using the Toilet? N N  In the past six months, have you accidently leaked urine? N N  Do you have problems with loss of bowel control? N N  Managing your Medications? N N  Managing your Finances? N N  Housekeeping or managing your Housekeeping? N N    Patient Care Team: Mechele Claude, MD as PCP - General (Family Medicine) Jonelle Sidle, MD as PCP - Cardiology (Cardiology) Mathis Bud, MD as Referring Physician (Internal Medicine) Luciano Cutter, PA-C (Physician Assistant) Michaelle Copas, MD as Referring Physician (Optometry)  Indicate any recent Medical Services you may have received from other than [facility] providers in the past year (date may be approximate).     Assessment:   This is a routine wellness examination for Diana Mathews.  Hearing/Vision screen Vision Screening - Comments:: Wears rx glasses - up to date with routine eye exams with  Dr.Le    Goals Addressed             This Visit's Progress    Exercise 150 min/wk Moderate Activity   On track    Stay active and independent Watch grandchildren grow up       Depression Screen    07/03/2023    1:03 PM 04/20/2023   10:25 AM  12/17/2022   10:20 AM 12/17/2022    9:42 AM 09/16/2022    9:23 AM 08/25/2022   10:32 AM 08/25/2022   10:27 AM  PHQ 2/9 Scores  PHQ - 2 Score 0 5 3 0 2 2 0  PHQ- 9 Score  11 9  5 11      Fall Risk    07/03/2023    1:01 PM 06/18/2023    9:32 AM 05/10/2023   11:13 AM 04/20/2023   10:03 AM 12/17/2022    9:42 AM  Fall Risk   Falls in the past year? 0 0 0 0 0   Number falls in past yr: 0 0 0    Injury with Fall? 0 0 0    Risk for fall due to : No Fall Risks      Follow up Falls prevention discussed        MEDICARE RISK AT HOME: Medicare Risk at Home Any stairs in or around the home?: Yes If so, are there any without handrails?: No Home free of loose throw rugs in walkways, pet beds, electrical cords, etc?: Yes Adequate lighting in your home to reduce risk of falls?: Yes Life alert?: No Use of a cane, walker or w/c?: No Grab bars in the bathroom?: Yes Shower chair or bench in shower?: Yes Elevated toilet seat or a handicapped toilet?: Yes  TIMED UP AND GO:  Was the test performed?  No    Cognitive Function:    01/10/2019   11:08 AM  MMSE - Mini Mental State Exam  Orientation to time 5  Orientation to Place 5  Registration 3  Attention/ Calculation 3  Recall 2  Language- name 2 objects 2  Language- repeat 1  Language- follow 3 step command 3  Language- read & follow direction 1  Write a sentence 1  Copy design 1  Total score 27        07/03/2023    1:04 PM 05/12/2022    2:54 PM 05/10/2021    2:28 PM 01/11/2020   11:40 AM  6CIT Screen  What Year? 0 points 0 points 0 points 0 points  What month? 0 points 0 points 0 points 0 points  What time? 0 points 0 points 0 points 0 points  Count back from 20 0 points 0 points 4 points 4 points  Months in reverse 0 points 4 points 4 points 4 points  Repeat phrase 0 points 0 points 0 points 0 points  Total Score 0 points 4 points 8 points 8 points    Immunizations Immunization History  Administered Date(s) Administered   Fluad Quad(high Dose 65+) 08/24/2020, 08/08/2021, 08/25/2022   Influenza, High Dose Seasonal PF 07/28/2019   Influenza,inj,Quad PF,6+ Mos 07/25/2016, 07/27/2018   Moderna SARS-COV2 Booster Vaccination 10/06/2020   Moderna Sars-Covid-2 Vaccination 01/18/2020, 02/13/2020   PNEUMOCOCCAL CONJUGATE-20 11/11/2021   Pneumococcal Conjugate-13 04/10/2020   Tdap  04/10/2020   Zoster Recombinant(Shingrix) 08/08/2021, 11/11/2021    TDAP status: Up to date  Flu Vaccine status: Up to date  Pneumococcal vaccine status: Up to date  Covid-19 vaccine status: Completed vaccines  Qualifies for Shingles Vaccine? Yes   Zostavax completed Yes   Shingrix Completed?: Yes  Screening Tests Health Maintenance  Topic Date Due   OPHTHALMOLOGY EXAM  Never done   Colonoscopy  Never done   COVID-19 Vaccine (3 - Moderna risk series) 11/03/2020   MAMMOGRAM  03/12/2023   INFLUENZA VACCINE  05/28/2023   Hepatitis C Screening  08/26/2023 (Originally  10/21/1969)   HEMOGLOBIN A1C  10/20/2023   Diabetic kidney evaluation - eGFR measurement  04/19/2024   Diabetic kidney evaluation - Urine ACR  04/19/2024   FOOT EXAM  04/19/2024   Medicare Annual Wellness (AWV)  07/02/2024   DTaP/Tdap/Td (2 - Td or Tdap) 04/10/2030   Pneumonia Vaccine 36+ Years old  Completed   DEXA SCAN  Completed   Zoster Vaccines- Shingrix  Completed   HPV VACCINES  Aged Out    Health Maintenance  Health Maintenance Due  Topic Date Due   OPHTHALMOLOGY EXAM  Never done   Colonoscopy  Never done   COVID-19 Vaccine (3 - Moderna risk series) 11/03/2020   MAMMOGRAM  03/12/2023   INFLUENZA VACCINE  05/28/2023    Colorectal cancer screening: Referral to GI placed declined per patient . Pt aware the office will call re: appt.  Mammogram status: Ordered scheduled 06/2023. Pt provided with contact info and advised to call to schedule appt.   Bone Density status: Ordered 07/03/2023. Pt provided with contact info and advised to call to schedule appt.  Lung Cancer Screening: (Low Dose CT Chest recommended if Age 90-80 years, 20 pack-year currently smoking OR have quit w/in 15years.) does not qualify.   Lung Cancer Screening Referral: n/a  Additional Screening:  Hepatitis C Screening: does not qualify;  Vision Screening: Recommended annual ophthalmology exams for early detection of glaucoma  and other disorders of the eye. Is the patient up to date with their annual eye exam?  Yes  Who is the provider or what is the name of the office in which the patient attends annual eye exams? Dr.Le  If pt is not established with a provider, would they like to be referred to a provider to establish care? No .   Dental Screening: Recommended annual dental exams for proper oral hygiene  Diabetic Foot Exam: Diabetic Foot Exam: Overdue, Pt has been advised about the importance in completing this exam. Pt is scheduled for diabetic foot exam on next office visit .  Community Resource Referral / Chronic Care Management: CRR required this visit?  No   CCM required this visit?  No     Plan:     I have personally reviewed and noted the following in the patient's chart:   Medical and social history Use of alcohol, tobacco or illicit drugs  Current medications and supplements including opioid prescriptions. Patient is not currently taking opioid prescriptions. Functional ability and status Nutritional status Physical activity Advanced directives List of other physicians Hospitalizations, surgeries, and ER visits in previous 12 months Vitals Screenings to include cognitive, depression, and falls Referrals and appointments  In addition, I have reviewed and discussed with patient certain preventive protocols, quality metrics, and best practice recommendations. A written personalized care plan for preventive services as well as general preventive health recommendations were provided to patient.     Lorrene Reid, LPN   01/28/346   After Visit Summary: (MyChart) Due to this being a telephonic visit, the after visit summary with patients personalized plan was offered to patient via MyChart   Nurse Notes: none

## 2023-07-03 NOTE — Patient Instructions (Signed)
Diana Mathews , Thank you for taking time to come for your Medicare Wellness Visit. I appreciate your ongoing commitment to your health goals. Please review the following plan we discussed and let me know if I can assist you in the future.   Referrals/Orders/Follow-Ups/Clinician Recommendations: Aim for 30 minutes of exercise or brisk walking, 6-8 glasses of water, and 5 servings of fruits and vegetables each day.   This is a list of the screening recommended for you and due dates:  Health Maintenance  Topic Date Due   Eye exam for diabetics  Never done   Colon Cancer Screening  Never done   COVID-19 Vaccine (3 - Moderna risk series) 11/03/2020   Mammogram  03/12/2023   Flu Shot  05/28/2023   Hepatitis C Screening  08/26/2023*   Hemoglobin A1C  10/20/2023   Yearly kidney function blood test for diabetes  04/19/2024   Yearly kidney health urinalysis for diabetes  04/19/2024   Complete foot exam   04/19/2024   Medicare Annual Wellness Visit  07/02/2024   DTaP/Tdap/Td vaccine (2 - Td or Tdap) 04/10/2030   Pneumonia Vaccine  Completed   DEXA scan (bone density measurement)  Completed   Zoster (Shingles) Vaccine  Completed   HPV Vaccine  Aged Out  *Topic was postponed. The date shown is not the original due date.    Advanced directives: (Provided) Advance directive discussed with you today. I have provided a copy for you to complete at home and have notarized. Once this is complete, please bring a copy in to our office so we can scan it into your chart. Information on Advanced Care Planning can be found at [facility] Advance Health Care Directives Advance Health Care Directives ([email or web address]    Next Medicare Annual Wellness Visit scheduled for next year: Yes  Insert Preventive Care attachment Insert FALL PREVENTION attachment if needed

## 2023-07-07 DIAGNOSIS — G894 Chronic pain syndrome: Secondary | ICD-10-CM | POA: Diagnosis not present

## 2023-07-07 DIAGNOSIS — M62838 Other muscle spasm: Secondary | ICD-10-CM | POA: Diagnosis not present

## 2023-07-09 DIAGNOSIS — K838 Other specified diseases of biliary tract: Secondary | ICD-10-CM | POA: Diagnosis not present

## 2023-07-09 DIAGNOSIS — I700 Atherosclerosis of aorta: Secondary | ICD-10-CM | POA: Diagnosis not present

## 2023-07-09 DIAGNOSIS — I2510 Atherosclerotic heart disease of native coronary artery without angina pectoris: Secondary | ICD-10-CM | POA: Diagnosis not present

## 2023-07-09 DIAGNOSIS — R748 Abnormal levels of other serum enzymes: Secondary | ICD-10-CM | POA: Diagnosis not present

## 2023-07-09 DIAGNOSIS — C7951 Secondary malignant neoplasm of bone: Secondary | ICD-10-CM | POA: Diagnosis not present

## 2023-07-09 DIAGNOSIS — C50912 Malignant neoplasm of unspecified site of left female breast: Secondary | ICD-10-CM | POA: Diagnosis not present

## 2023-07-09 DIAGNOSIS — D0511 Intraductal carcinoma in situ of right breast: Secondary | ICD-10-CM | POA: Diagnosis not present

## 2023-07-09 DIAGNOSIS — C773 Secondary and unspecified malignant neoplasm of axilla and upper limb lymph nodes: Secondary | ICD-10-CM | POA: Diagnosis not present

## 2023-07-12 ENCOUNTER — Other Ambulatory Visit: Payer: Self-pay | Admitting: Family Medicine

## 2023-07-22 ENCOUNTER — Ambulatory Visit: Payer: 59 | Admitting: Family Medicine

## 2023-07-22 DIAGNOSIS — C7951 Secondary malignant neoplasm of bone: Secondary | ICD-10-CM | POA: Diagnosis not present

## 2023-07-22 DIAGNOSIS — R921 Mammographic calcification found on diagnostic imaging of breast: Secondary | ICD-10-CM | POA: Diagnosis not present

## 2023-07-22 DIAGNOSIS — C773 Secondary and unspecified malignant neoplasm of axilla and upper limb lymph nodes: Secondary | ICD-10-CM | POA: Diagnosis not present

## 2023-07-22 DIAGNOSIS — R92323 Mammographic fibroglandular density, bilateral breasts: Secondary | ICD-10-CM | POA: Diagnosis not present

## 2023-07-22 DIAGNOSIS — C50919 Malignant neoplasm of unspecified site of unspecified female breast: Secondary | ICD-10-CM | POA: Diagnosis not present

## 2023-07-22 DIAGNOSIS — C50912 Malignant neoplasm of unspecified site of left female breast: Secondary | ICD-10-CM | POA: Diagnosis not present

## 2023-07-24 DIAGNOSIS — G8929 Other chronic pain: Secondary | ICD-10-CM | POA: Diagnosis not present

## 2023-07-24 DIAGNOSIS — R030 Elevated blood-pressure reading, without diagnosis of hypertension: Secondary | ICD-10-CM | POA: Diagnosis not present

## 2023-07-24 DIAGNOSIS — R7309 Other abnormal glucose: Secondary | ICD-10-CM | POA: Diagnosis not present

## 2023-07-24 DIAGNOSIS — G4700 Insomnia, unspecified: Secondary | ICD-10-CM | POA: Diagnosis not present

## 2023-07-24 DIAGNOSIS — Z9181 History of falling: Secondary | ICD-10-CM | POA: Diagnosis not present

## 2023-07-24 DIAGNOSIS — L981 Factitial dermatitis: Secondary | ICD-10-CM | POA: Diagnosis not present

## 2023-07-24 DIAGNOSIS — Z23 Encounter for immunization: Secondary | ICD-10-CM | POA: Diagnosis not present

## 2023-07-24 DIAGNOSIS — C50912 Malignant neoplasm of unspecified site of left female breast: Secondary | ICD-10-CM | POA: Diagnosis not present

## 2023-07-24 DIAGNOSIS — M545 Low back pain, unspecified: Secondary | ICD-10-CM | POA: Diagnosis not present

## 2023-07-27 DIAGNOSIS — I10 Essential (primary) hypertension: Secondary | ICD-10-CM | POA: Diagnosis not present

## 2023-07-30 ENCOUNTER — Ambulatory Visit: Payer: 59

## 2023-07-30 ENCOUNTER — Encounter: Payer: Self-pay | Admitting: Family Medicine

## 2023-07-30 ENCOUNTER — Ambulatory Visit: Payer: 59 | Admitting: Family Medicine

## 2023-07-30 VITALS — BP 115/67 | HR 87 | Temp 97.2°F | Ht 63.0 in | Wt 189.0 lb

## 2023-07-30 DIAGNOSIS — F39 Unspecified mood [affective] disorder: Secondary | ICD-10-CM | POA: Diagnosis not present

## 2023-07-30 DIAGNOSIS — E118 Type 2 diabetes mellitus with unspecified complications: Secondary | ICD-10-CM | POA: Diagnosis not present

## 2023-07-30 DIAGNOSIS — Z7984 Long term (current) use of oral hypoglycemic drugs: Secondary | ICD-10-CM | POA: Diagnosis not present

## 2023-07-30 DIAGNOSIS — E785 Hyperlipidemia, unspecified: Secondary | ICD-10-CM | POA: Diagnosis not present

## 2023-07-30 DIAGNOSIS — I10 Essential (primary) hypertension: Secondary | ICD-10-CM | POA: Diagnosis not present

## 2023-07-30 DIAGNOSIS — I4811 Longstanding persistent atrial fibrillation: Secondary | ICD-10-CM

## 2023-07-30 DIAGNOSIS — E119 Type 2 diabetes mellitus without complications: Secondary | ICD-10-CM | POA: Insufficient documentation

## 2023-07-30 LAB — BAYER DCA HB A1C WAIVED: HB A1C (BAYER DCA - WAIVED): 6.2 % — ABNORMAL HIGH (ref 4.8–5.6)

## 2023-07-30 MED ORDER — DILTIAZEM HCL ER COATED BEADS 240 MG PO CP24
240.0000 mg | ORAL_CAPSULE | Freq: Two times a day (BID) | ORAL | 3 refills | Status: DC
Start: 2023-07-30 — End: 2024-06-20

## 2023-07-30 MED ORDER — ROPINIROLE HCL 1 MG PO TABS: 1.0000 mg | ORAL_TABLET | Freq: Every day | ORAL | 3 refills | Status: DC

## 2023-07-30 MED ORDER — TRAZODONE HCL 300 MG PO TABS: ORAL_TABLET | ORAL | 1 refills | Status: DC

## 2023-07-30 MED ORDER — CARVEDILOL 6.25 MG PO TABS
6.2500 mg | ORAL_TABLET | Freq: Two times a day (BID) | ORAL | 3 refills | Status: DC
Start: 2023-07-30 — End: 2024-06-20

## 2023-07-30 MED ORDER — OZEMPIC (1 MG/DOSE) 4 MG/3ML ~~LOC~~ SOPN: 1.0000 mg | PEN_INJECTOR | SUBCUTANEOUS | 5 refills | Status: DC

## 2023-07-30 MED ORDER — METFORMIN HCL ER 750 MG PO TB24
ORAL_TABLET | ORAL | 3 refills | Status: DC
Start: 2023-07-30 — End: 2024-08-09

## 2023-07-30 MED ORDER — DULOXETINE HCL 60 MG PO CPEP: ORAL_CAPSULE | ORAL | 3 refills | Status: DC

## 2023-07-30 MED ORDER — ARIPIPRAZOLE 5 MG PO TABS
ORAL_TABLET | ORAL | 3 refills | Status: DC
Start: 2023-07-30 — End: 2024-07-18

## 2023-07-30 MED ORDER — ATORVASTATIN CALCIUM 80 MG PO TABS: ORAL_TABLET | ORAL | 3 refills | Status: DC

## 2023-07-30 NOTE — Progress Notes (Signed)
Subjective:  Patient ID: Diana Mathews,  female    DOB: [birth year]  Age: 72 y.o.    CC: Medical Management of Chronic Issues   HPI AFNAN MANCINELLI presents for  follow-up of hypertension. Patient has no history of headache chest pain or shortness of breath or recent cough. Patient also denies symptoms of TIA such as numbness weakness lateralizing. Patient denies side effects from medication. States taking it regularly.  Patient also  in for follow-up of elevated cholesterol. Doing well without complaints on current medication. Denies side effects  including myalgia and arthralgia and nausea. Also in today for liver function testing. Currently no chest pain, shortness of breath or other cardiovascular related symptoms noted.  Follow-up of diabetes. Patient does check blood sugar at home. Readings run between 116-120 fasting  and up to 132 prandial Patient denies symptoms such as excessive hunger or urinary frequency, excessive hunger, nausea No significant hypoglycemic spells noted. Eating a lot of bananas to bring up potassium. Medications reviewed. Pt reports taking them regularly. Pt. denies complication/adverse reaction today.    History Aydee has a past medical history of Atrial fibrillation (HCC) (06/12/2018), Depression, Essential hypertension, Headache, Hyperlipidemia, Metastatic breast cancer, Osteoarthritis, Sleep apnea, TIA (transient ischemic attack), and Type 2 diabetes mellitus (HCC).   She has a past surgical history that includes Abdominal hysterectomy; Wrist surgery (Bilateral); Hernia repair; Cholecystectomy; Tubal ligation; and Foot surgery (Left).   Her family history includes Arthritis in her mother, sister, and sister; COPD in her mother; Diabetes in her brother, brother, mother, sister, and sister; Hypertension in her mother; Mental illness in her brother.She reports that she quit smoking about 38 years ago. Her smoking use included cigarettes. She has never used  smokeless tobacco. She reports that she does not drink alcohol and does not use drugs.  Current Outpatient Medications on File Prior to Visit  Medication Sig Dispense Refill   Alcohol Swabs (B-D SINGLE USE SWABS REGULAR) PADS Test BS QID Dx E11.8 400 each 3   baclofen (LIORESAL) 10 MG tablet Take 10 mg by mouth 4 (four) times daily.     Blood Glucose Monitoring Suppl DEVI 1 each by Does not apply route in the morning, at noon, and at bedtime. May substitute to any manufacturer covered by patient's insurance. 1 each 0   busPIRone (BUSPAR) 10 MG tablet Take 1 tablet (10 mg total) by mouth 3 (three) times daily as needed. 270 tablet 3   diphenhydrAMINE (BENADRYL) 25 mg capsule Take 25 mg by mouth at bedtime as needed.     ELIQUIS 5 MG TABS tablet TAKE ONE (1) TABLET BY MOUTH TWO (2) TIMES DAILY 180 tablet 3   glucose blood (ONETOUCH VERIO) test strip Test BS in the morning, at noon and at bedtime Dx E11.8 300 each 3   HYDROcodone-acetaminophen (NORCO) 10-325 MG tablet Take 1 tablet by mouth every 6 (six) hours as needed.     letrozole (FEMARA) 2.5 MG tablet Take 2.5 mg by mouth daily.     palbociclib (IBRANCE) 125 MG tablet Take by mouth.     TRUEplus Lancets 33G MISC Test BS QID Dx E11.8 400 each 3   No current facility-administered medications on file prior to visit.    ROS Review of Systems  Constitutional: Negative.   HENT: Negative.    Eyes:  Negative for visual disturbance.  Respiratory:  Negative for shortness of breath.   Cardiovascular:  Negative for chest pain.  Gastrointestinal:  Negative for abdominal pain.  Musculoskeletal:  Positive for back pain (left buttocks pain8/10 started last week. Stepped on a curb wrong.). Negative for arthralgias.  Psychiatric/Behavioral:  Positive for sleep disturbance (trouble initiating and maintaining sleep).     Objective:  BP 115/67   Pulse 87   Temp (!) 97.2 F (36.2 C)   Ht 5\' 3"  (1.6 m)   Wt 189 lb (85.7 kg)   SpO2 96%   BMI 33.48  kg/m   BP Readings from Last 3 Encounters:  07/30/23 115/67  04/20/23 (!) 105/49  12/17/22 134/74    Wt Readings from Last 3 Encounters:  07/30/23 189 lb (85.7 kg)  07/03/23 186 lb (84.4 kg)  04/20/23 187 lb 9.6 oz (85.1 kg)     Physical Exam Constitutional:      General: She is not in acute distress.    Appearance: She is well-developed.  Cardiovascular:     Rate and Rhythm: Normal rate and regular rhythm.  Pulmonary:     Breath sounds: Normal breath sounds.  Musculoskeletal:        General: Normal range of motion.  Skin:    General: Skin is warm and dry.  Neurological:     Mental Status: She is alert and oriented to person, place, and time.     Diabetic Foot Exam - Simple   No data filed     Lab Results  Component Value Date   HGBA1C 6.2 (H) 07/30/2023   HGBA1C 6.0 (H) 04/20/2023   HGBA1C 6.9 (H) 12/17/2022    Assessment & Plan:   Cassidy was seen today for medical management of chronic issues.  Diagnoses and all orders for this visit:  Essential hypertension -     CBC with Differential/Platelet -     CMP14+EGFR  Hyperlipidemia, unspecified hyperlipidemia type -     Lipid panel  Controlled type 2 diabetes mellitus with complication, without long-term current use of insulin (HCC) -     Bayer DCA Hb A1c Waived -     metFORMIN (GLUCOPHAGE-XR) 750 MG 24 hr tablet; TAKE ONE (1) TABLET BY MOUTH TWO (2) TIMES DAILY  Mood disorder (HCC) -     ARIPiprazole (ABILIFY) 5 MG tablet; TAKE ONE (1) TABLET EACH DAY  Longstanding persistent atrial fibrillation (HCC) -     carvedilol (COREG) 6.25 MG tablet; Take 1 tablet (6.25 mg total) by mouth 2 (two) times daily with a meal. -     diltiazem (CARDIZEM CD) 240 MG 24 hr capsule; Take 1 capsule (240 mg total) by mouth 2 (two) times daily.  Diabetes mellitus treated with injections of non-insulin medication (HCC)  Other orders -     atorvastatin (LIPITOR) 80 MG tablet; TAKE ONE (1) TABLET EACH DAY -     DULoxetine  (CYMBALTA) 60 MG capsule; TAKE 1 CAPSULE EVERY MORNING AND TAKE 1 CAPSULE AT BEDTIME -     rOPINIRole (REQUIP) 1 MG tablet; Take 1 tablet (1 mg total) by mouth at bedtime. -     traZODone (DESYREL) 300 MG tablet; TAKE 1/3 TO 1 TABLET AT BEDTIME AS NEEDED FOR SLEEP -     Semaglutide, 1 MG/DOSE, (OZEMPIC, 1 MG/DOSE,) 4 MG/3ML SOPN; Inject 1 mg into the skin once a week.   I have discontinued Sherlin Sicari. Minervini's Ozempic (0.25 or 0.5 MG/DOSE). I have also changed her rOPINIRole and trazodone. Additionally, I am having her start on Ozempic (1 MG/DOSE). Lastly, I am having her maintain her diphenhydrAMINE, HYDROcodone-acetaminophen, palbociclib, TRUEplus Lancets 33G, letrozole, baclofen, busPIRone, Eliquis,  B-D SINGLE USE SWABS REGULAR, Blood Glucose Monitoring Suppl, OneTouch Verio, ARIPiprazole, atorvastatin, carvedilol, diltiazem, DULoxetine, and metFORMIN.  Meds ordered this encounter  Medications   ARIPiprazole (ABILIFY) 5 MG tablet    Sig: TAKE ONE (1) TABLET EACH DAY    Dispense:  90 tablet    Refill:  3   atorvastatin (LIPITOR) 80 MG tablet    Sig: TAKE ONE (1) TABLET EACH DAY    Dispense:  90 tablet    Refill:  3   carvedilol (COREG) 6.25 MG tablet    Sig: Take 1 tablet (6.25 mg total) by mouth 2 (two) times daily with a meal.    Dispense:  180 tablet    Refill:  3   diltiazem (CARDIZEM CD) 240 MG 24 hr capsule    Sig: Take 1 capsule (240 mg total) by mouth 2 (two) times daily.    Dispense:  180 capsule    Refill:  3   DULoxetine (CYMBALTA) 60 MG capsule    Sig: TAKE 1 CAPSULE EVERY MORNING AND TAKE 1 CAPSULE AT BEDTIME    Dispense:  180 capsule    Refill:  3   metFORMIN (GLUCOPHAGE-XR) 750 MG 24 hr tablet    Sig: TAKE ONE (1) TABLET BY MOUTH TWO (2) TIMES DAILY    Dispense:  180 tablet    Refill:  3   rOPINIRole (REQUIP) 1 MG tablet    Sig: Take 1 tablet (1 mg total) by mouth at bedtime.    Dispense:  90 tablet    Refill:  3   traZODone (DESYREL) 300 MG tablet    Sig: TAKE  1/3 TO 1 TABLET AT BEDTIME AS NEEDED FOR SLEEP    Dispense:  90 tablet    Refill:  1   Semaglutide, 1 MG/DOSE, (OZEMPIC, 1 MG/DOSE,) 4 MG/3ML SOPN    Sig: Inject 1 mg into the skin once a week.    Dispense:  3 mL    Refill:  5     Follow-up: Return in about 3 months (around 10/30/2023).  Mechele Claude, M.D.

## 2023-07-31 DIAGNOSIS — T451X5A Adverse effect of antineoplastic and immunosuppressive drugs, initial encounter: Secondary | ICD-10-CM | POA: Diagnosis not present

## 2023-07-31 DIAGNOSIS — E118 Type 2 diabetes mellitus with unspecified complications: Secondary | ICD-10-CM | POA: Diagnosis not present

## 2023-07-31 DIAGNOSIS — C7951 Secondary malignant neoplasm of bone: Secondary | ICD-10-CM | POA: Diagnosis not present

## 2023-07-31 DIAGNOSIS — Z79899 Other long term (current) drug therapy: Secondary | ICD-10-CM | POA: Diagnosis not present

## 2023-07-31 DIAGNOSIS — I10 Essential (primary) hypertension: Secondary | ICD-10-CM | POA: Diagnosis not present

## 2023-07-31 DIAGNOSIS — D84821 Immunodeficiency due to drugs: Secondary | ICD-10-CM | POA: Diagnosis not present

## 2023-07-31 DIAGNOSIS — C50912 Malignant neoplasm of unspecified site of left female breast: Secondary | ICD-10-CM | POA: Diagnosis not present

## 2023-07-31 DIAGNOSIS — D72820 Lymphocytosis (symptomatic): Secondary | ICD-10-CM | POA: Diagnosis not present

## 2023-07-31 DIAGNOSIS — C773 Secondary and unspecified malignant neoplasm of axilla and upper limb lymph nodes: Secondary | ICD-10-CM | POA: Diagnosis not present

## 2023-07-31 DIAGNOSIS — E611 Iron deficiency: Secondary | ICD-10-CM | POA: Diagnosis not present

## 2023-07-31 DIAGNOSIS — D701 Agranulocytosis secondary to cancer chemotherapy: Secondary | ICD-10-CM | POA: Diagnosis not present

## 2023-07-31 LAB — CMP14+EGFR
ALT: 36 [IU]/L — ABNORMAL HIGH (ref 0–32)
AST: 37 [IU]/L (ref 0–40)
Albumin: 4.2 g/dL (ref 3.9–4.9)
Alkaline Phosphatase: 129 [IU]/L — ABNORMAL HIGH (ref 44–121)
BUN/Creatinine Ratio: 18 (ref 12–28)
BUN: 18 mg/dL (ref 8–27)
Bilirubin Total: 0.3 mg/dL (ref 0.0–1.2)
CO2: 23 mmol/L (ref 20–29)
Calcium: 9.8 mg/dL (ref 8.7–10.3)
Chloride: 100 mmol/L (ref 96–106)
Creatinine, Ser: 0.98 mg/dL (ref 0.57–1.00)
Globulin, Total: 3.1 g/dL (ref 1.5–4.5)
Glucose: 161 mg/dL — ABNORMAL HIGH (ref 70–99)
Potassium: 5.2 mmol/L (ref 3.5–5.2)
Sodium: 139 mmol/L (ref 134–144)
Total Protein: 7.3 g/dL (ref 6.0–8.5)
eGFR: 62 mL/min/{1.73_m2} (ref >59)

## 2023-07-31 LAB — CBC WITH DIFFERENTIAL/PLATELET
Basophils Absolute: 0.1 10*3/uL (ref 0.0–0.2)
Basos: 2 %
EOS (ABSOLUTE): 0.1 10*3/uL (ref 0.0–0.4)
Eos: 4 %
Hematocrit: 37 % (ref 34.0–46.6)
Hemoglobin: 12.3 g/dL (ref 11.1–15.9)
Immature Grans (Abs): 0 10*3/uL (ref 0.0–0.1)
Immature Granulocytes: 1 %
Lymphocytes Absolute: 1.8 10*3/uL (ref 0.7–3.1)
Lymphs: 48 %
MCH: 36.1 pg — ABNORMAL HIGH (ref 26.6–33.0)
MCHC: 33.2 g/dL (ref 31.5–35.7)
MCV: 109 fL — ABNORMAL HIGH (ref 79–97)
Monocytes Absolute: 0.2 10*3/uL (ref 0.1–0.9)
Monocytes: 7 %
Neutrophils Absolute: 1.4 10*3/uL (ref 1.4–7.0)
Neutrophils: 38 %
Platelets: 280 10*3/uL (ref 150–450)
RBC: 3.41 x10E6/uL — ABNORMAL LOW (ref 3.77–5.28)
RDW: 15.1 % (ref 11.7–15.4)
WBC: 3.6 10*3/uL (ref 3.4–10.8)

## 2023-07-31 LAB — LIPID PANEL
Chol/HDL Ratio: 1.6 {ratio} (ref 0.0–4.4)
Cholesterol, Total: 144 mg/dL (ref 100–199)
HDL: 92 mg/dL (ref >39)
LDL Chol Calc (NIH): 36 mg/dL (ref 0–99)
Triglycerides: 84 mg/dL (ref 0–149)
VLDL Cholesterol Cal: 16 mg/dL (ref 5–40)

## 2023-08-02 ENCOUNTER — Encounter: Payer: Self-pay | Admitting: Family Medicine

## 2023-08-03 NOTE — Progress Notes (Signed)
Hello Lyrica,  Your lab result is normal and/or stable.Some minor variations that are not significant are commonly marked abnormal, but do not represent any medical problem for you.  Best regards, Claretta Fraise, M.D.

## 2023-08-06 DIAGNOSIS — M62838 Other muscle spasm: Secondary | ICD-10-CM | POA: Diagnosis not present

## 2023-08-06 DIAGNOSIS — G894 Chronic pain syndrome: Secondary | ICD-10-CM | POA: Diagnosis not present

## 2023-08-10 ENCOUNTER — Other Ambulatory Visit: Payer: Self-pay | Admitting: Family Medicine

## 2023-08-20 DIAGNOSIS — R7401 Elevation of levels of liver transaminase levels: Secondary | ICD-10-CM | POA: Diagnosis not present

## 2023-08-20 DIAGNOSIS — Z79899 Other long term (current) drug therapy: Secondary | ICD-10-CM | POA: Diagnosis not present

## 2023-08-21 DIAGNOSIS — R7309 Other abnormal glucose: Secondary | ICD-10-CM | POA: Diagnosis not present

## 2023-08-21 DIAGNOSIS — R030 Elevated blood-pressure reading, without diagnosis of hypertension: Secondary | ICD-10-CM | POA: Diagnosis not present

## 2023-08-21 DIAGNOSIS — C50912 Malignant neoplasm of unspecified site of left female breast: Secondary | ICD-10-CM | POA: Diagnosis not present

## 2023-08-21 DIAGNOSIS — Z9181 History of falling: Secondary | ICD-10-CM | POA: Diagnosis not present

## 2023-08-21 DIAGNOSIS — Z79899 Other long term (current) drug therapy: Secondary | ICD-10-CM | POA: Diagnosis not present

## 2023-08-21 DIAGNOSIS — G4700 Insomnia, unspecified: Secondary | ICD-10-CM | POA: Diagnosis not present

## 2023-08-21 DIAGNOSIS — L981 Factitial dermatitis: Secondary | ICD-10-CM | POA: Diagnosis not present

## 2023-08-21 DIAGNOSIS — G8929 Other chronic pain: Secondary | ICD-10-CM | POA: Diagnosis not present

## 2023-08-21 DIAGNOSIS — M545 Low back pain, unspecified: Secondary | ICD-10-CM | POA: Diagnosis not present

## 2023-09-02 ENCOUNTER — Encounter: Payer: Self-pay | Admitting: Family Medicine

## 2023-09-04 DIAGNOSIS — R748 Abnormal levels of other serum enzymes: Secondary | ICD-10-CM | POA: Diagnosis not present

## 2023-09-04 DIAGNOSIS — C50912 Malignant neoplasm of unspecified site of left female breast: Secondary | ICD-10-CM | POA: Diagnosis not present

## 2023-09-04 DIAGNOSIS — C7951 Secondary malignant neoplasm of bone: Secondary | ICD-10-CM | POA: Diagnosis not present

## 2023-09-04 DIAGNOSIS — Z5111 Encounter for antineoplastic chemotherapy: Secondary | ICD-10-CM | POA: Diagnosis not present

## 2023-09-04 DIAGNOSIS — C773 Secondary and unspecified malignant neoplasm of axilla and upper limb lymph nodes: Secondary | ICD-10-CM | POA: Diagnosis not present

## 2023-09-04 DIAGNOSIS — C50919 Malignant neoplasm of unspecified site of unspecified female breast: Secondary | ICD-10-CM | POA: Diagnosis not present

## 2023-09-04 DIAGNOSIS — C50911 Malignant neoplasm of unspecified site of right female breast: Secondary | ICD-10-CM | POA: Diagnosis not present

## 2023-09-06 DIAGNOSIS — G894 Chronic pain syndrome: Secondary | ICD-10-CM | POA: Diagnosis not present

## 2023-09-06 DIAGNOSIS — M62838 Other muscle spasm: Secondary | ICD-10-CM | POA: Diagnosis not present

## 2023-09-07 ENCOUNTER — Other Ambulatory Visit: Payer: Self-pay | Admitting: Family Medicine

## 2023-09-21 DIAGNOSIS — C50912 Malignant neoplasm of unspecified site of left female breast: Secondary | ICD-10-CM | POA: Diagnosis not present

## 2023-09-21 DIAGNOSIS — M545 Low back pain, unspecified: Secondary | ICD-10-CM | POA: Diagnosis not present

## 2023-09-21 DIAGNOSIS — L981 Factitial dermatitis: Secondary | ICD-10-CM | POA: Diagnosis not present

## 2023-09-21 DIAGNOSIS — G8929 Other chronic pain: Secondary | ICD-10-CM | POA: Diagnosis not present

## 2023-09-21 DIAGNOSIS — R030 Elevated blood-pressure reading, without diagnosis of hypertension: Secondary | ICD-10-CM | POA: Diagnosis not present

## 2023-09-21 DIAGNOSIS — G4700 Insomnia, unspecified: Secondary | ICD-10-CM | POA: Diagnosis not present

## 2023-09-21 DIAGNOSIS — R7309 Other abnormal glucose: Secondary | ICD-10-CM | POA: Diagnosis not present

## 2023-09-21 DIAGNOSIS — Z9181 History of falling: Secondary | ICD-10-CM | POA: Diagnosis not present

## 2023-10-02 DIAGNOSIS — I10 Essential (primary) hypertension: Secondary | ICD-10-CM | POA: Diagnosis not present

## 2023-10-02 DIAGNOSIS — Z79811 Long term (current) use of aromatase inhibitors: Secondary | ICD-10-CM | POA: Diagnosis not present

## 2023-10-02 DIAGNOSIS — C7951 Secondary malignant neoplasm of bone: Secondary | ICD-10-CM | POA: Diagnosis not present

## 2023-10-02 DIAGNOSIS — Z79899 Other long term (current) drug therapy: Secondary | ICD-10-CM | POA: Diagnosis not present

## 2023-10-02 DIAGNOSIS — C50912 Malignant neoplasm of unspecified site of left female breast: Secondary | ICD-10-CM | POA: Diagnosis not present

## 2023-10-02 DIAGNOSIS — C773 Secondary and unspecified malignant neoplasm of axilla and upper limb lymph nodes: Secondary | ICD-10-CM | POA: Diagnosis not present

## 2023-10-02 DIAGNOSIS — R945 Abnormal results of liver function studies: Secondary | ICD-10-CM | POA: Diagnosis not present

## 2023-10-06 DIAGNOSIS — M62838 Other muscle spasm: Secondary | ICD-10-CM | POA: Diagnosis not present

## 2023-10-06 DIAGNOSIS — G894 Chronic pain syndrome: Secondary | ICD-10-CM | POA: Diagnosis not present

## 2023-10-19 DIAGNOSIS — C50912 Malignant neoplasm of unspecified site of left female breast: Secondary | ICD-10-CM | POA: Diagnosis not present

## 2023-10-19 DIAGNOSIS — E7800 Pure hypercholesterolemia, unspecified: Secondary | ICD-10-CM | POA: Diagnosis not present

## 2023-10-19 DIAGNOSIS — M545 Low back pain, unspecified: Secondary | ICD-10-CM | POA: Diagnosis not present

## 2023-10-19 DIAGNOSIS — R7309 Other abnormal glucose: Secondary | ICD-10-CM | POA: Diagnosis not present

## 2023-10-19 DIAGNOSIS — R030 Elevated blood-pressure reading, without diagnosis of hypertension: Secondary | ICD-10-CM | POA: Diagnosis not present

## 2023-10-19 DIAGNOSIS — G8929 Other chronic pain: Secondary | ICD-10-CM | POA: Diagnosis not present

## 2023-10-19 DIAGNOSIS — E559 Vitamin D deficiency, unspecified: Secondary | ICD-10-CM | POA: Diagnosis not present

## 2023-10-19 DIAGNOSIS — E119 Type 2 diabetes mellitus without complications: Secondary | ICD-10-CM | POA: Diagnosis not present

## 2023-10-19 DIAGNOSIS — L981 Factitial dermatitis: Secondary | ICD-10-CM | POA: Diagnosis not present

## 2023-10-19 DIAGNOSIS — Z9181 History of falling: Secondary | ICD-10-CM | POA: Diagnosis not present

## 2023-10-19 DIAGNOSIS — Z79899 Other long term (current) drug therapy: Secondary | ICD-10-CM | POA: Diagnosis not present

## 2023-10-19 DIAGNOSIS — G4700 Insomnia, unspecified: Secondary | ICD-10-CM | POA: Diagnosis not present

## 2023-10-30 DIAGNOSIS — T451X5A Adverse effect of antineoplastic and immunosuppressive drugs, initial encounter: Secondary | ICD-10-CM | POA: Diagnosis not present

## 2023-10-30 DIAGNOSIS — I10 Essential (primary) hypertension: Secondary | ICD-10-CM | POA: Diagnosis not present

## 2023-10-30 DIAGNOSIS — Z79899 Other long term (current) drug therapy: Secondary | ICD-10-CM | POA: Diagnosis not present

## 2023-10-30 DIAGNOSIS — E118 Type 2 diabetes mellitus with unspecified complications: Secondary | ICD-10-CM | POA: Diagnosis not present

## 2023-10-30 DIAGNOSIS — C7951 Secondary malignant neoplasm of bone: Secondary | ICD-10-CM | POA: Diagnosis not present

## 2023-10-30 DIAGNOSIS — D701 Agranulocytosis secondary to cancer chemotherapy: Secondary | ICD-10-CM | POA: Diagnosis not present

## 2023-10-30 DIAGNOSIS — R945 Abnormal results of liver function studies: Secondary | ICD-10-CM | POA: Diagnosis not present

## 2023-10-30 DIAGNOSIS — C50912 Malignant neoplasm of unspecified site of left female breast: Secondary | ICD-10-CM | POA: Diagnosis not present

## 2023-10-30 DIAGNOSIS — C773 Secondary and unspecified malignant neoplasm of axilla and upper limb lymph nodes: Secondary | ICD-10-CM | POA: Diagnosis not present

## 2023-10-30 DIAGNOSIS — D84821 Immunodeficiency due to drugs: Secondary | ICD-10-CM | POA: Diagnosis not present

## 2023-10-30 DIAGNOSIS — R748 Abnormal levels of other serum enzymes: Secondary | ICD-10-CM | POA: Diagnosis not present

## 2023-10-30 DIAGNOSIS — E611 Iron deficiency: Secondary | ICD-10-CM | POA: Diagnosis not present

## 2023-10-31 ENCOUNTER — Other Ambulatory Visit: Payer: Self-pay | Admitting: Family Medicine

## 2023-11-02 ENCOUNTER — Other Ambulatory Visit: Payer: 59

## 2023-11-02 ENCOUNTER — Ambulatory Visit: Payer: 59 | Admitting: Family Medicine

## 2023-11-02 ENCOUNTER — Encounter: Payer: Self-pay | Admitting: Family Medicine

## 2023-11-02 VITALS — BP 130/79 | HR 67 | Temp 97.3°F | Ht 63.0 in | Wt 198.0 lb

## 2023-11-02 DIAGNOSIS — J40 Bronchitis, not specified as acute or chronic: Secondary | ICD-10-CM | POA: Diagnosis not present

## 2023-11-02 DIAGNOSIS — Z7984 Long term (current) use of oral hypoglycemic drugs: Secondary | ICD-10-CM

## 2023-11-02 DIAGNOSIS — J329 Chronic sinusitis, unspecified: Secondary | ICD-10-CM

## 2023-11-02 DIAGNOSIS — I10 Essential (primary) hypertension: Secondary | ICD-10-CM | POA: Diagnosis not present

## 2023-11-02 DIAGNOSIS — E118 Type 2 diabetes mellitus with unspecified complications: Secondary | ICD-10-CM

## 2023-11-02 DIAGNOSIS — E785 Hyperlipidemia, unspecified: Secondary | ICD-10-CM

## 2023-11-02 LAB — LIPID PANEL

## 2023-11-02 LAB — BAYER DCA HB A1C WAIVED: HB A1C (BAYER DCA - WAIVED): 6.7 % — ABNORMAL HIGH (ref 4.8–5.6)

## 2023-11-02 MED ORDER — AMOXICILLIN-POT CLAVULANATE 875-125 MG PO TABS: 1.0000 | ORAL_TABLET | Freq: Two times a day (BID) | ORAL | 0 refills | Status: DC

## 2023-11-02 MED ORDER — BENZONATATE 200 MG PO CAPS: 200.0000 mg | ORAL_CAPSULE | Freq: Three times a day (TID) | ORAL | 0 refills | Status: DC | PRN

## 2023-11-02 NOTE — Progress Notes (Signed)
 Subjective:  Patient ID: Diana Mathews,  female    DOB: [birth year]  Age: 73 y.o.    CC: Medical Management of Chronic Issues (3 mth DM) and Cough (Dry cough since christmas would like something for it.)   HPI Koa G Ferrington presents for  follow-up of hypertension. Patient has no history of headache chest pain or shortness of breath or recent cough. Patient also denies symptoms of TIA such as numbness weakness lateralizing. Patient denies side effects from medication. States taking it regularly.  Patient also  in for follow-up of elevated cholesterol. Doing well without complaints on current medication. Denies side effects  including myalgia and arthralgia and nausea. Also in today for liver function testing. Currently no chest pain, shortness of breath or other cardiovascular related symptoms noted.  Follow-up of diabetes. Patient does check blood sugar at home. Readings run between 120 and 200 Patient denies symptoms such as excessive hunger or urinary frequency, excessive hunger, nausea No significant hypoglycemic spells noted. Medications reviewed. Pt reports taking them regularly. Pt. denies complication/adverse reaction today.    History Tyra has a past medical history of Atrial fibrillation (HCC) (06/12/2018), Depression, Essential hypertension, Headache, Hyperlipidemia, Metastatic breast cancer, Osteoarthritis, Sleep apnea, TIA (transient ischemic attack), and Type 2 diabetes mellitus (HCC).   She has a past surgical history that includes Abdominal hysterectomy; Wrist surgery (Bilateral); Hernia repair; Cholecystectomy; Tubal ligation; and Foot surgery (Left).   Her family history includes Arthritis in her mother, sister, and sister; COPD in her mother; Diabetes in her brother, brother, mother, sister, and sister; Hypertension in her mother; Mental illness in her brother.She reports that she quit smoking about 38 years ago. Her smoking use included cigarettes. She has never  used smokeless tobacco. She reports that she does not drink alcohol  and does not use drugs.  Current Outpatient Medications on File Prior to Visit  Medication Sig Dispense Refill   ARIPiprazole  (ABILIFY ) 5 MG tablet TAKE ONE (1) TABLET EACH DAY 90 tablet 3   atorvastatin  (LIPITOR) 80 MG tablet TAKE ONE (1) TABLET EACH DAY 90 tablet 3   baclofen (LIORESAL) 10 MG tablet Take 10 mg by mouth 4 (four) times daily.     Blood Glucose Monitoring Suppl DEVI 1 each by Does not apply route in the morning, at noon, and at bedtime. May substitute to any manufacturer covered by patient's insurance. 1 each 0   busPIRone  (BUSPAR ) 10 MG tablet TAKE ONE TABLET BY MOUTH THREE TIMES DAILY AS NEEDED 270 tablet 3   carvedilol  (COREG ) 6.25 MG tablet Take 1 tablet (6.25 mg total) by mouth 2 (two) times daily with a meal. 180 tablet 3   diltiazem  (CARDIZEM  CD) 240 MG 24 hr capsule Take 1 capsule (240 mg total) by mouth 2 (two) times daily. 180 capsule 3   diphenhydrAMINE (BENADRYL) 25 mg capsule Take 25 mg by mouth at bedtime as needed.     DULoxetine  (CYMBALTA ) 60 MG capsule TAKE 1 CAPSULE EVERY MORNING AND TAKE 1 CAPSULE AT BEDTIME 180 capsule 3   ELIQUIS  5 MG TABS tablet TAKE ONE (1) TABLET BY MOUTH TWO (2) TIMES DAILY 180 tablet 3   glucose blood (ONETOUCH VERIO) test strip CHECK MORNING, NOON, & BEDTIME Dx E11.8 300 each 3   HYDROcodone-acetaminophen  (NORCO) 10-325 MG tablet Take 1 tablet by mouth every 6 (six) hours as needed.     letrozole (FEMARA) 2.5 MG tablet Take 2.5 mg by mouth daily.     metFORMIN  (GLUCOPHAGE -XR) 750 MG 24  hr tablet TAKE ONE (1) TABLET BY MOUTH TWO (2) TIMES DAILY 180 tablet 3   palbociclib (IBRANCE) 125 MG tablet Take by mouth.     rOPINIRole  (REQUIP ) 1 MG tablet Take 1 tablet (1 mg total) by mouth at bedtime. 90 tablet 3   Semaglutide , 1 MG/DOSE, (OZEMPIC , 1 MG/DOSE,) 4 MG/3ML SOPN Inject 1 mg into the skin once a week. 3 mL 5   traZODone  (DESYREL ) 300 MG tablet TAKE 1/3 TO 1 TABLET AT  BEDTIME AS NEEDED FOR SLEEP 90 tablet 1   TRUEplus Lancets 33G MISC Test BS QID Dx E11.8 400 each 3   No current facility-administered medications on file prior to visit.    ROS Review of Systems  Constitutional:  Negative for activity change, appetite change, chills and fever.  HENT:  Positive for congestion, postnasal drip and rhinorrhea. Negative for ear discharge, ear pain, hearing loss, nosebleeds, sinus pressure, sneezing and trouble swallowing.   Respiratory:  Positive for cough (dry onset 10 days ago. Not responding to OTC meds). Negative for chest tightness and shortness of breath.   Cardiovascular:  Negative for chest pain and palpitations.  Skin:  Negative for rash.    Objective:  BP 130/79   Pulse 67   Temp (!) 97.3 F (36.3 C) (Temporal)   Ht 5' 3 (1.6 m)   Wt 198 lb (89.8 kg)   SpO2 97%   BMI 35.07 kg/m   BP Readings from Last 3 Encounters:  11/02/23 130/79  07/30/23 115/67  04/20/23 (!) 105/49    Wt Readings from Last 3 Encounters:  11/02/23 198 lb (89.8 kg)  07/30/23 189 lb (85.7 kg)  07/03/23 186 lb (84.4 kg)     Physical Exam Constitutional:      General: She is not in acute distress.    Appearance: She is well-developed.  Cardiovascular:     Rate and Rhythm: Normal rate and regular rhythm.  Pulmonary:     Breath sounds: Normal breath sounds.  Musculoskeletal:        General: Normal range of motion.  Skin:    General: Skin is warm and dry.  Neurological:     Mental Status: She is alert and oriented to person, place, and time.    Lab Results  Component Value Date   HGBA1C 6.7 (H) 11/02/2023   HGBA1C 6.2 (H) 07/30/2023   HGBA1C 6.0 (H) 04/20/2023    Assessment & Plan:   Leith was seen today for medical management of chronic issues and cough.  Diagnoses and all orders for this visit:  Essential hypertension -     CBC with Differential/Platelet -     CMP14+EGFR -     Bayer DCA Hb A1c Waived  Hyperlipidemia, unspecified  hyperlipidemia type -     Lipid panel  Controlled type 2 diabetes mellitus with complication, without long-term current use of insulin  (HCC) -     Bayer DCA Hb A1c Waived  Sinobronchitis  Other orders -     amoxicillin -clavulanate (AUGMENTIN ) 875-125 MG tablet; Take 1 tablet by mouth 2 (two) times daily. Take all of this medication -     benzonatate  (TESSALON ) 200 MG capsule; Take 1 capsule (200 mg total) by mouth 3 (three) times daily as needed for cough.   I have discontinued Emmaly G. Perrow's B-D SINGLE USE SWABS REGULAR. I am also having her start on amoxicillin -clavulanate and benzonatate . Additionally, I am having her maintain her diphenhydrAMINE, HYDROcodone-acetaminophen , palbociclib, TRUEplus Lancets 33G, letrozole, baclofen, Eliquis , Blood Glucose  Monitoring Suppl, ARIPiprazole , atorvastatin , carvedilol , diltiazem , DULoxetine , metFORMIN , rOPINIRole , trazodone , Ozempic  (1 MG/DOSE), OneTouch Verio, and busPIRone .  Meds ordered this encounter  Medications   amoxicillin -clavulanate (AUGMENTIN ) 875-125 MG tablet    Sig: Take 1 tablet by mouth 2 (two) times daily. Take all of this medication    Dispense:  20 tablet    Refill:  0   benzonatate  (TESSALON ) 200 MG capsule    Sig: Take 1 capsule (200 mg total) by mouth 3 (three) times daily as needed for cough.    Dispense:  20 capsule    Refill:  0     Follow-up: Return in about 3 months (around 01/31/2024) for diabetes.  Butler Der, M.D.

## 2023-11-03 LAB — CMP14+EGFR
ALT: 210 IU/L — ABNORMAL HIGH (ref 0–32)
AST: 122 IU/L — ABNORMAL HIGH (ref 0–40)
Albumin: 4.1 g/dL (ref 3.9–4.9)
Alkaline Phosphatase: 182 IU/L — ABNORMAL HIGH (ref 44–121)
BUN/Creatinine Ratio: 20 (ref 12–28)
BUN: 17 mg/dL (ref 8–27)
Bilirubin Total: 0.5 mg/dL (ref 0.0–1.2)
CO2: 24 mmol/L (ref 20–29)
Calcium: 9.4 mg/dL (ref 8.7–10.3)
Chloride: 98 mmol/L (ref 96–106)
Creatinine, Ser: 0.87 mg/dL (ref 0.57–1.00)
Globulin, Total: 3.3 g/dL (ref 1.5–4.5)
Glucose: 194 mg/dL — ABNORMAL HIGH (ref 70–99)
Potassium: 5 mmol/L (ref 3.5–5.2)
Sodium: 137 mmol/L (ref 134–144)
Total Protein: 7.4 g/dL (ref 6.0–8.5)
eGFR: 72 mL/min/{1.73_m2} (ref >59)

## 2023-11-03 LAB — LIPID PANEL
Cholesterol, Total: 132 mg/dL (ref 100–199)
HDL: 75 mg/dL (ref >39)
LDL CALC COMMENT:: 1.8 ratio (ref 0.0–4.4)
LDL Chol Calc (NIH): 43 mg/dL (ref 0–99)
Triglycerides: 72 mg/dL (ref 0–149)
VLDL Cholesterol Cal: 14 mg/dL (ref 5–40)

## 2023-11-03 LAB — CBC WITH DIFFERENTIAL/PLATELET
Basophils Absolute: 0.1 10*3/uL (ref 0.0–0.2)
Basos: 1 %
EOS (ABSOLUTE): 0.1 10*3/uL (ref 0.0–0.4)
Eos: 3 %
Hematocrit: 34.6 % (ref 34.0–46.6)
Hemoglobin: 11.6 g/dL (ref 11.1–15.9)
Immature Grans (Abs): 0 10*3/uL (ref 0.0–0.1)
Immature Granulocytes: 0 %
Lymphocytes Absolute: 1.4 10*3/uL (ref 0.7–3.1)
Lymphs: 39 %
MCH: 36.4 pg — ABNORMAL HIGH (ref 26.6–33.0)
MCHC: 33.5 g/dL (ref 31.5–35.7)
MCV: 109 fL — ABNORMAL HIGH (ref 79–97)
Monocytes Absolute: 0.2 10*3/uL (ref 0.1–0.9)
Monocytes: 7 %
Neutrophils Absolute: 1.8 10*3/uL (ref 1.4–7.0)
Neutrophils: 50 %
Platelets: 181 10*3/uL (ref 150–450)
RBC: 3.19 x10E6/uL — ABNORMAL LOW (ref 3.77–5.28)
RDW: 13.8 % (ref 11.7–15.4)
WBC: 3.6 10*3/uL (ref 3.4–10.8)

## 2023-11-03 NOTE — Progress Notes (Signed)
Hello Lyrica,  Your lab result is normal and/or stable.Some minor variations that are not significant are commonly marked abnormal, but do not represent any medical problem for you.  Best regards, Claretta Fraise, M.D.

## 2023-11-04 DIAGNOSIS — R7989 Other specified abnormal findings of blood chemistry: Secondary | ICD-10-CM | POA: Diagnosis not present

## 2023-11-04 DIAGNOSIS — R7401 Elevation of levels of liver transaminase levels: Secondary | ICD-10-CM | POA: Diagnosis not present

## 2023-11-06 DIAGNOSIS — M62838 Other muscle spasm: Secondary | ICD-10-CM | POA: Diagnosis not present

## 2023-11-06 DIAGNOSIS — G894 Chronic pain syndrome: Secondary | ICD-10-CM | POA: Diagnosis not present

## 2023-11-12 ENCOUNTER — Other Ambulatory Visit: Payer: Self-pay | Admitting: Family Medicine

## 2023-11-13 ENCOUNTER — Telehealth: Payer: Self-pay

## 2023-11-13 ENCOUNTER — Other Ambulatory Visit: Payer: Self-pay | Admitting: Family Medicine

## 2023-11-13 DIAGNOSIS — D0511 Intraductal carcinoma in situ of right breast: Secondary | ICD-10-CM | POA: Diagnosis not present

## 2023-11-13 DIAGNOSIS — C50912 Malignant neoplasm of unspecified site of left female breast: Secondary | ICD-10-CM | POA: Diagnosis not present

## 2023-11-13 DIAGNOSIS — R748 Abnormal levels of other serum enzymes: Secondary | ICD-10-CM | POA: Diagnosis not present

## 2023-11-13 DIAGNOSIS — C773 Secondary and unspecified malignant neoplasm of axilla and upper limb lymph nodes: Secondary | ICD-10-CM | POA: Diagnosis not present

## 2023-11-13 MED ORDER — BENZONATATE 200 MG PO CAPS: 200.0000 mg | ORAL_CAPSULE | Freq: Three times a day (TID) | ORAL | 0 refills | Status: DC | PRN

## 2023-11-13 NOTE — Addendum Note (Signed)
Addended by: Dorene Sorrow on: 11/13/2023 11:57 AM   Modules accepted: Orders

## 2023-11-13 NOTE — Telephone Encounter (Signed)
One refill sent. Pt made aware and if no better in a few weeks to schedule an appt. Pt understood.

## 2023-11-13 NOTE — Telephone Encounter (Signed)
Copied from CRM [id number]. Topic: Clinical - Prescription Issue >> Nov 13, 2023 10:08 AM Shelah Lewandowsky wrote: Reason for CRM: benzonatate (TESSALON) 200 MG capsule waiting to hear back about refill, requested yesterday from pharmacy.

## 2023-11-19 DIAGNOSIS — D0511 Intraductal carcinoma in situ of right breast: Secondary | ICD-10-CM | POA: Diagnosis not present

## 2023-11-19 DIAGNOSIS — C50912 Malignant neoplasm of unspecified site of left female breast: Secondary | ICD-10-CM | POA: Diagnosis not present

## 2023-11-19 DIAGNOSIS — C773 Secondary and unspecified malignant neoplasm of axilla and upper limb lymph nodes: Secondary | ICD-10-CM | POA: Diagnosis not present

## 2023-11-20 DIAGNOSIS — R7309 Other abnormal glucose: Secondary | ICD-10-CM | POA: Diagnosis not present

## 2023-11-20 DIAGNOSIS — Z9181 History of falling: Secondary | ICD-10-CM | POA: Diagnosis not present

## 2023-11-20 DIAGNOSIS — M545 Low back pain, unspecified: Secondary | ICD-10-CM | POA: Diagnosis not present

## 2023-11-20 DIAGNOSIS — C50912 Malignant neoplasm of unspecified site of left female breast: Secondary | ICD-10-CM | POA: Diagnosis not present

## 2023-11-20 DIAGNOSIS — G4700 Insomnia, unspecified: Secondary | ICD-10-CM | POA: Diagnosis not present

## 2023-11-20 DIAGNOSIS — Z0000 Encounter for general adult medical examination without abnormal findings: Secondary | ICD-10-CM | POA: Diagnosis not present

## 2023-11-20 DIAGNOSIS — L981 Factitial dermatitis: Secondary | ICD-10-CM | POA: Diagnosis not present

## 2023-11-20 DIAGNOSIS — R030 Elevated blood-pressure reading, without diagnosis of hypertension: Secondary | ICD-10-CM | POA: Diagnosis not present

## 2023-11-20 DIAGNOSIS — G8929 Other chronic pain: Secondary | ICD-10-CM | POA: Diagnosis not present

## 2023-11-27 DIAGNOSIS — C7951 Secondary malignant neoplasm of bone: Secondary | ICD-10-CM | POA: Diagnosis not present

## 2023-11-27 DIAGNOSIS — C773 Secondary and unspecified malignant neoplasm of axilla and upper limb lymph nodes: Secondary | ICD-10-CM | POA: Diagnosis not present

## 2023-11-27 DIAGNOSIS — C50912 Malignant neoplasm of unspecified site of left female breast: Secondary | ICD-10-CM | POA: Diagnosis not present

## 2023-11-27 DIAGNOSIS — C50911 Malignant neoplasm of unspecified site of right female breast: Secondary | ICD-10-CM | POA: Diagnosis not present

## 2023-12-04 DIAGNOSIS — C50912 Malignant neoplasm of unspecified site of left female breast: Secondary | ICD-10-CM | POA: Diagnosis not present

## 2023-12-04 DIAGNOSIS — D0511 Intraductal carcinoma in situ of right breast: Secondary | ICD-10-CM | POA: Diagnosis not present

## 2023-12-04 DIAGNOSIS — C773 Secondary and unspecified malignant neoplasm of axilla and upper limb lymph nodes: Secondary | ICD-10-CM | POA: Diagnosis not present

## 2023-12-07 DIAGNOSIS — R7401 Elevation of levels of liver transaminase levels: Secondary | ICD-10-CM | POA: Diagnosis not present

## 2023-12-07 DIAGNOSIS — G894 Chronic pain syndrome: Secondary | ICD-10-CM | POA: Diagnosis not present

## 2023-12-07 DIAGNOSIS — M62838 Other muscle spasm: Secondary | ICD-10-CM | POA: Diagnosis not present

## 2023-12-21 DIAGNOSIS — L981 Factitial dermatitis: Secondary | ICD-10-CM | POA: Diagnosis not present

## 2023-12-21 DIAGNOSIS — C50912 Malignant neoplasm of unspecified site of left female breast: Secondary | ICD-10-CM | POA: Diagnosis not present

## 2023-12-21 DIAGNOSIS — E7800 Pure hypercholesterolemia, unspecified: Secondary | ICD-10-CM | POA: Diagnosis not present

## 2023-12-21 DIAGNOSIS — G8929 Other chronic pain: Secondary | ICD-10-CM | POA: Diagnosis not present

## 2023-12-21 DIAGNOSIS — M545 Low back pain, unspecified: Secondary | ICD-10-CM | POA: Diagnosis not present

## 2023-12-21 DIAGNOSIS — Z9181 History of falling: Secondary | ICD-10-CM | POA: Diagnosis not present

## 2023-12-21 DIAGNOSIS — G4700 Insomnia, unspecified: Secondary | ICD-10-CM | POA: Diagnosis not present

## 2023-12-21 DIAGNOSIS — R7309 Other abnormal glucose: Secondary | ICD-10-CM | POA: Diagnosis not present

## 2023-12-21 DIAGNOSIS — R030 Elevated blood-pressure reading, without diagnosis of hypertension: Secondary | ICD-10-CM | POA: Diagnosis not present

## 2023-12-25 DIAGNOSIS — C7951 Secondary malignant neoplasm of bone: Secondary | ICD-10-CM | POA: Diagnosis not present

## 2023-12-25 DIAGNOSIS — C773 Secondary and unspecified malignant neoplasm of axilla and upper limb lymph nodes: Secondary | ICD-10-CM | POA: Diagnosis not present

## 2023-12-25 DIAGNOSIS — C50919 Malignant neoplasm of unspecified site of unspecified female breast: Secondary | ICD-10-CM | POA: Diagnosis not present

## 2023-12-25 DIAGNOSIS — C50912 Malignant neoplasm of unspecified site of left female breast: Secondary | ICD-10-CM | POA: Diagnosis not present

## 2023-12-26 ENCOUNTER — Other Ambulatory Visit: Payer: Self-pay | Admitting: Family Medicine

## 2024-01-06 DIAGNOSIS — G894 Chronic pain syndrome: Secondary | ICD-10-CM | POA: Diagnosis not present

## 2024-01-06 DIAGNOSIS — M62838 Other muscle spasm: Secondary | ICD-10-CM | POA: Diagnosis not present

## 2024-01-07 DIAGNOSIS — C50919 Malignant neoplasm of unspecified site of unspecified female breast: Secondary | ICD-10-CM | POA: Diagnosis not present

## 2024-01-07 DIAGNOSIS — R748 Abnormal levels of other serum enzymes: Secondary | ICD-10-CM | POA: Diagnosis not present

## 2024-01-07 DIAGNOSIS — C773 Secondary and unspecified malignant neoplasm of axilla and upper limb lymph nodes: Secondary | ICD-10-CM | POA: Diagnosis not present

## 2024-01-07 DIAGNOSIS — C50912 Malignant neoplasm of unspecified site of left female breast: Secondary | ICD-10-CM | POA: Diagnosis not present

## 2024-01-07 DIAGNOSIS — C7951 Secondary malignant neoplasm of bone: Secondary | ICD-10-CM | POA: Diagnosis not present

## 2024-01-19 DIAGNOSIS — M545 Low back pain, unspecified: Secondary | ICD-10-CM | POA: Diagnosis not present

## 2024-01-19 DIAGNOSIS — R7309 Other abnormal glucose: Secondary | ICD-10-CM | POA: Diagnosis not present

## 2024-01-19 DIAGNOSIS — E7800 Pure hypercholesterolemia, unspecified: Secondary | ICD-10-CM | POA: Diagnosis not present

## 2024-01-19 DIAGNOSIS — L981 Factitial dermatitis: Secondary | ICD-10-CM | POA: Diagnosis not present

## 2024-01-19 DIAGNOSIS — G4700 Insomnia, unspecified: Secondary | ICD-10-CM | POA: Diagnosis not present

## 2024-01-19 DIAGNOSIS — E559 Vitamin D deficiency, unspecified: Secondary | ICD-10-CM | POA: Diagnosis not present

## 2024-01-19 DIAGNOSIS — Z9181 History of falling: Secondary | ICD-10-CM | POA: Diagnosis not present

## 2024-01-19 DIAGNOSIS — E119 Type 2 diabetes mellitus without complications: Secondary | ICD-10-CM | POA: Diagnosis not present

## 2024-01-19 DIAGNOSIS — Z79899 Other long term (current) drug therapy: Secondary | ICD-10-CM | POA: Diagnosis not present

## 2024-01-19 DIAGNOSIS — R030 Elevated blood-pressure reading, without diagnosis of hypertension: Secondary | ICD-10-CM | POA: Diagnosis not present

## 2024-01-19 DIAGNOSIS — C50912 Malignant neoplasm of unspecified site of left female breast: Secondary | ICD-10-CM | POA: Diagnosis not present

## 2024-01-19 DIAGNOSIS — G8929 Other chronic pain: Secondary | ICD-10-CM | POA: Diagnosis not present

## 2024-01-26 ENCOUNTER — Other Ambulatory Visit: Payer: Self-pay | Admitting: Family Medicine

## 2024-01-28 DIAGNOSIS — M79672 Pain in left foot: Secondary | ICD-10-CM | POA: Diagnosis not present

## 2024-01-28 DIAGNOSIS — M779 Enthesopathy, unspecified: Secondary | ICD-10-CM | POA: Diagnosis not present

## 2024-02-01 ENCOUNTER — Other Ambulatory Visit: Payer: 59

## 2024-02-01 ENCOUNTER — Ambulatory Visit: Payer: 59 | Admitting: Family Medicine

## 2024-02-05 DIAGNOSIS — C50911 Malignant neoplasm of unspecified site of right female breast: Secondary | ICD-10-CM | POA: Diagnosis not present

## 2024-02-05 DIAGNOSIS — C7951 Secondary malignant neoplasm of bone: Secondary | ICD-10-CM | POA: Diagnosis not present

## 2024-02-05 DIAGNOSIS — C773 Secondary and unspecified malignant neoplasm of axilla and upper limb lymph nodes: Secondary | ICD-10-CM | POA: Diagnosis not present

## 2024-02-05 DIAGNOSIS — C50912 Malignant neoplasm of unspecified site of left female breast: Secondary | ICD-10-CM | POA: Diagnosis not present

## 2024-02-06 DIAGNOSIS — M62838 Other muscle spasm: Secondary | ICD-10-CM | POA: Diagnosis not present

## 2024-02-06 DIAGNOSIS — G894 Chronic pain syndrome: Secondary | ICD-10-CM | POA: Diagnosis not present

## 2024-02-16 ENCOUNTER — Ambulatory Visit (INDEPENDENT_AMBULATORY_CARE_PROVIDER_SITE_OTHER)

## 2024-02-16 ENCOUNTER — Encounter (INDEPENDENT_AMBULATORY_CARE_PROVIDER_SITE_OTHER): Admitting: Family Medicine

## 2024-02-16 DIAGNOSIS — Z780 Asymptomatic menopausal state: Secondary | ICD-10-CM

## 2024-02-17 DIAGNOSIS — C773 Secondary and unspecified malignant neoplasm of axilla and upper limb lymph nodes: Secondary | ICD-10-CM | POA: Diagnosis not present

## 2024-02-17 DIAGNOSIS — I4719 Other supraventricular tachycardia: Secondary | ICD-10-CM | POA: Diagnosis not present

## 2024-02-17 DIAGNOSIS — I10 Essential (primary) hypertension: Secondary | ICD-10-CM | POA: Diagnosis not present

## 2024-02-17 DIAGNOSIS — C50912 Malignant neoplasm of unspecified site of left female breast: Secondary | ICD-10-CM | POA: Diagnosis not present

## 2024-02-17 DIAGNOSIS — I471 Supraventricular tachycardia, unspecified: Secondary | ICD-10-CM | POA: Diagnosis not present

## 2024-02-17 DIAGNOSIS — I480 Paroxysmal atrial fibrillation: Secondary | ICD-10-CM | POA: Diagnosis not present

## 2024-02-18 DIAGNOSIS — M85852 Other specified disorders of bone density and structure, left thigh: Secondary | ICD-10-CM | POA: Diagnosis not present

## 2024-02-18 DIAGNOSIS — Z780 Asymptomatic menopausal state: Secondary | ICD-10-CM | POA: Diagnosis not present

## 2024-02-18 NOTE — Progress Notes (Signed)
 Error

## 2024-02-19 DIAGNOSIS — G8929 Other chronic pain: Secondary | ICD-10-CM | POA: Diagnosis not present

## 2024-02-19 DIAGNOSIS — R7309 Other abnormal glucose: Secondary | ICD-10-CM | POA: Diagnosis not present

## 2024-02-19 DIAGNOSIS — E119 Type 2 diabetes mellitus without complications: Secondary | ICD-10-CM | POA: Diagnosis not present

## 2024-02-19 DIAGNOSIS — M545 Low back pain, unspecified: Secondary | ICD-10-CM | POA: Diagnosis not present

## 2024-02-19 DIAGNOSIS — Z9181 History of falling: Secondary | ICD-10-CM | POA: Diagnosis not present

## 2024-02-19 DIAGNOSIS — E7800 Pure hypercholesterolemia, unspecified: Secondary | ICD-10-CM | POA: Diagnosis not present

## 2024-02-19 DIAGNOSIS — R030 Elevated blood-pressure reading, without diagnosis of hypertension: Secondary | ICD-10-CM | POA: Diagnosis not present

## 2024-02-19 DIAGNOSIS — L981 Factitial dermatitis: Secondary | ICD-10-CM | POA: Diagnosis not present

## 2024-02-19 DIAGNOSIS — C50912 Malignant neoplasm of unspecified site of left female breast: Secondary | ICD-10-CM | POA: Diagnosis not present

## 2024-02-19 DIAGNOSIS — I4891 Unspecified atrial fibrillation: Secondary | ICD-10-CM | POA: Diagnosis not present

## 2024-02-19 DIAGNOSIS — E875 Hyperkalemia: Secondary | ICD-10-CM | POA: Diagnosis not present

## 2024-02-24 ENCOUNTER — Encounter: Payer: Self-pay | Admitting: Family Medicine

## 2024-02-24 ENCOUNTER — Ambulatory Visit (INDEPENDENT_AMBULATORY_CARE_PROVIDER_SITE_OTHER): Admitting: Family Medicine

## 2024-02-24 VITALS — BP 114/60 | HR 85 | Temp 97.9°F | Ht 63.0 in | Wt 204.0 lb

## 2024-02-24 DIAGNOSIS — N393 Stress incontinence (female) (male): Secondary | ICD-10-CM

## 2024-02-24 DIAGNOSIS — Z7984 Long term (current) use of oral hypoglycemic drugs: Secondary | ICD-10-CM | POA: Diagnosis not present

## 2024-02-24 DIAGNOSIS — R7989 Other specified abnormal findings of blood chemistry: Secondary | ICD-10-CM

## 2024-02-24 DIAGNOSIS — I4811 Longstanding persistent atrial fibrillation: Secondary | ICD-10-CM | POA: Diagnosis not present

## 2024-02-24 DIAGNOSIS — F39 Unspecified mood [affective] disorder: Secondary | ICD-10-CM

## 2024-02-24 DIAGNOSIS — I10 Essential (primary) hypertension: Secondary | ICD-10-CM | POA: Diagnosis not present

## 2024-02-24 DIAGNOSIS — E785 Hyperlipidemia, unspecified: Secondary | ICD-10-CM | POA: Diagnosis not present

## 2024-02-24 DIAGNOSIS — R748 Abnormal levels of other serum enzymes: Secondary | ICD-10-CM

## 2024-02-24 DIAGNOSIS — E118 Type 2 diabetes mellitus with unspecified complications: Secondary | ICD-10-CM

## 2024-02-24 DIAGNOSIS — Z7985 Long-term (current) use of injectable non-insulin antidiabetic drugs: Secondary | ICD-10-CM

## 2024-02-24 LAB — LIPID PANEL

## 2024-02-24 LAB — BAYER DCA HB A1C WAIVED: HB A1C (BAYER DCA - WAIVED): 7.2 % — ABNORMAL HIGH (ref 4.8–5.6)

## 2024-02-24 MED ORDER — APIXABAN 5 MG PO TABS: 5.0000 mg | ORAL_TABLET | Freq: Two times a day (BID) | ORAL | 3 refills | Status: AC

## 2024-02-24 MED ORDER — TIRZEPATIDE 5 MG/0.5ML ~~LOC~~ SOAJ: 5.0000 mg | SUBCUTANEOUS | 1 refills | Status: DC

## 2024-02-24 MED ORDER — ONDANSETRON 8 MG PO TBDP: 8.0000 mg | ORAL_TABLET | Freq: Four times a day (QID) | ORAL | 1 refills | Status: DC | PRN

## 2024-02-24 NOTE — Progress Notes (Addendum)
 Subjective:  Patient ID: Diana Mathews,  female    DOB: 1951-02-19  Age: 74 y.o.    CC: Medical Management of Chronic Issues   HPI Diana Mathews presents for  follow-up of hypertension. Patient has no history of headache chest pain or shortness of breath or recent cough. Patient also denies symptoms of TIA such as numbness weakness lateralizing. Patient denies side effects from medication. States taking it regularly.  Patient also  in for follow-up of elevated cholesterol. Doing well without complaints on current medication. Denies side effects  including myalgia and arthralgia and nausea. Also in today for liver function testing. Currently no chest pain, shortness of breath or other cardiovascular related symptoms noted.  Follow-up of diabetes. Patient does check blood sugar at home. Readings run between 120 fasting and as high as 196 PP Patient denies symptoms such as excessive hunger or urinary frequency, excessive hunger, No significant hypoglycemic spells noted. Medications reviewed. Pt reports taking them regularly. Gaining weight with  ozempic . Every shot makes her nauseated for three days.  Patient suffers from urinary stress incontinence.  She has probably some overflow as well.  Basically she wets herself when she coughs laughs sometimes when she bends over.  As a result she has been using and needs to continue reducing adult to pull on disposable underwear for which she needs 150 sets per month.  She also has disposable underpants also known as Chux that she uses 90/month and nonsterile gloves of which she uses 4 boxes/month.   History Diana Mathews has a past medical history of Atrial fibrillation (HCC) (06/12/2018), Depression, Essential hypertension, Headache, Hyperlipidemia, Metastatic breast cancer, Osteoarthritis, Sleep apnea, TIA (transient ischemic attack), and Type 2 diabetes mellitus (HCC).   She has a past surgical history that includes Abdominal hysterectomy; Wrist  surgery (Bilateral); Hernia repair; Cholecystectomy; Tubal ligation; and Foot surgery (Left).   Her family history includes Arthritis in her mother, sister, and sister; COPD in her mother; Diabetes in her brother, brother, mother, sister, and sister; Hypertension in her mother; Mental illness in her brother.She reports that she quit smoking about 39 years ago. Her smoking use included cigarettes. She has never used smokeless tobacco. She reports that she does not drink alcohol  and does not use drugs.  Current Outpatient Medications on File Prior to Visit  Medication Sig Dispense Refill   ARIPiprazole  (ABILIFY ) 5 MG tablet TAKE ONE (1) TABLET EACH DAY 90 tablet 3   atorvastatin  (LIPITOR) 80 MG tablet TAKE ONE (1) TABLET EACH DAY 90 tablet 3   baclofen (LIORESAL) 10 MG tablet Take 10 mg by mouth 4 (four) times daily.     Blood Glucose Monitoring Suppl DEVI 1 each by Does not apply route in the morning, at noon, and at bedtime. May substitute to any manufacturer covered by patient's insurance. 1 each 0   busPIRone  (BUSPAR ) 10 MG tablet TAKE ONE TABLET BY MOUTH THREE TIMES DAILY AS NEEDED 270 tablet 3   diltiazem  (CARDIZEM  CD) 240 MG 24 hr capsule Take 1 capsule (240 mg total) by mouth 2 (two) times daily. 180 capsule 3   diphenhydrAMINE (BENADRYL) 25 mg capsule Take 25 mg by mouth at bedtime as needed.     DULoxetine  (CYMBALTA ) 60 MG capsule TAKE 1 CAPSULE EVERY MORNING AND TAKE 1 CAPSULE AT BEDTIME 180 capsule 3   glucose blood (ONETOUCH VERIO) test strip CHECK MORNING, NOON, & BEDTIME Dx E11.8 300 each 3   HYDROcodone-acetaminophen  (NORCO) 10-325 MG tablet Take 1 tablet by  mouth every 6 (six) hours as needed.     letrozole (FEMARA) 2.5 MG tablet Take 2.5 mg by mouth daily.     metFORMIN  (GLUCOPHAGE -XR) 750 MG 24 hr tablet TAKE ONE (1) TABLET BY MOUTH TWO (2) TIMES DAILY 180 tablet 3   palbociclib (IBRANCE) 125 MG tablet Take by mouth.     rOPINIRole  (REQUIP ) 1 MG tablet Take 1 tablet (1 mg total)  by mouth at bedtime. 90 tablet 3   trazodone  (DESYREL ) 300 MG tablet TAKE 1/3 TO 1 TABLET AT BEDTIME AS NEEDED FOR SLEEP 90 tablet 0   TRUEplus Lancets 33G MISC Test BS QID Dx E11.8 400 each 3   amoxicillin -clavulanate (AUGMENTIN ) 875-125 MG tablet Take 1 tablet by mouth 2 (two) times daily. Take all of this medication (Patient not taking: Reported on 02/24/2024) 20 tablet 0   benzonatate  (TESSALON ) 200 MG capsule Take 1 capsule (200 mg total) by mouth 3 (three) times daily as needed for cough. (Patient not taking: Reported on 02/24/2024) 20 capsule 0   carvedilol  (COREG ) 6.25 MG tablet Take 1 tablet (6.25 mg total) by mouth 2 (two) times daily with a meal. 180 tablet 3   No current facility-administered medications on file prior to visit.    ROS Review of Systems  Constitutional: Negative.   HENT: Negative.    Eyes:  Negative for visual disturbance.  Respiratory:  Negative for shortness of breath.   Cardiovascular:  Negative for chest pain.  Gastrointestinal:  Negative for abdominal pain.  Genitourinary:  Positive for enuresis and urgency.  Musculoskeletal:  Negative for arthralgias.    Objective:  BP 114/60   Pulse 85   Temp 97.9 F (36.6 C)   Ht 5\' 3"  (1.6 m)   Wt 204 lb (92.5 kg)   SpO2 97%   BMI 36.14 kg/m   BP Readings from Last 3 Encounters:  02/24/24 114/60  11/02/23 130/79  07/30/23 115/67    Wt Readings from Last 3 Encounters:  02/24/24 204 lb (92.5 kg)  11/02/23 198 lb (89.8 kg)  07/30/23 189 lb (85.7 kg)    Lab Results  Component Value Date   HGBA1C 6.7 (H) 11/02/2023   HGBA1C 6.2 (H) 07/30/2023   HGBA1C 6.0 (H) 04/20/2023    Physical Exam Constitutional:      General: She is not in acute distress.    Appearance: She is well-developed.  Cardiovascular:     Rate and Rhythm: Normal rate and regular rhythm.  Pulmonary:     Breath sounds: Normal breath sounds.  Musculoskeletal:        General: Normal range of motion.  Skin:    General: Skin is warm  and dry.  Neurological:     Mental Status: She is alert and oriented to person, place, and time.         Assessment & Plan:  Controlled type 2 diabetes mellitus with complication, without long-term current use of insulin  (HCC) -     Bayer DCA Hb A1c Waived  Hyperlipidemia, unspecified hyperlipidemia type -     CMP14+EGFR -     Lipid panel  Essential hypertension -     CBC with Differential/Platelet  Mood disorder (HCC)  Abnormal CBC -     CBC with Differential/Platelet  Elevated liver enzymes -     CMP14+EGFR  Longstanding persistent atrial fibrillation (HCC) -     Apixaban ; Take 1 tablet (5 mg total) by mouth 2 (two) times daily.  Dispense: 180 tablet; Refill: 3  Other  orders -     Tirzepatide; Inject 5 mg into the skin once a week.  Dispense: 6 mL; Refill: 1 -     Ondansetron ; Take 1 tablet (8 mg total) by mouth every 6 (six) hours as needed for nausea or vomiting.  Dispense: 20 tablet; Refill: 1  Patient is not tolerating the Ozempic  very well because of nausea.  Additionally she is not quite at her goal in spite of taking the maximum dose she can tolerate even when given nausea medicine.  As a result she needs to try going to [facility] to see if we can get her A1c down below 7 as well as reduce the problem with nausea.  Follow-up: No follow-ups on file.  Roise Cleaver, M.D.

## 2024-02-25 ENCOUNTER — Ambulatory Visit: Payer: Self-pay

## 2024-02-25 ENCOUNTER — Other Ambulatory Visit: Payer: Self-pay | Admitting: Family Medicine

## 2024-02-25 LAB — CBC WITH DIFFERENTIAL/PLATELET
Basophils Absolute: 0.1 10*3/uL (ref 0.0–0.2)
Basos: 1 %
EOS (ABSOLUTE): 0.6 10*3/uL — ABNORMAL HIGH (ref 0.0–0.4)
Eos: 14 %
Hematocrit: 39 % (ref 34.0–46.6)
Hemoglobin: 12.5 g/dL (ref 11.1–15.9)
Immature Grans (Abs): 0 10*3/uL (ref 0.0–0.1)
Immature Granulocytes: 1 %
Lymphocytes Absolute: 1.5 10*3/uL (ref 0.7–3.1)
Lymphs: 37 %
MCH: 31.3 pg (ref 26.6–33.0)
MCHC: 32.1 g/dL (ref 31.5–35.7)
MCV: 98 fL — ABNORMAL HIGH (ref 79–97)
Monocytes Absolute: 0.2 10*3/uL (ref 0.1–0.9)
Monocytes: 6 %
Neutrophils Absolute: 1.8 10*3/uL (ref 1.4–7.0)
Neutrophils: 41 %
Platelets: 228 10*3/uL (ref 150–450)
RBC: 4 x10E6/uL (ref 3.77–5.28)
RDW: 12.6 % (ref 11.7–15.4)
WBC: 4.2 10*3/uL (ref 3.4–10.8)

## 2024-02-25 LAB — CMP14+EGFR
ALT: 35 IU/L — ABNORMAL HIGH (ref 0–32)
AST: 25 IU/L (ref 0–40)
Albumin: 4.4 g/dL (ref 3.9–4.9)
Alkaline Phosphatase: 93 IU/L (ref 44–121)
BUN/Creatinine Ratio: 21 (ref 12–28)
BUN: 23 mg/dL (ref 8–27)
Bilirubin Total: 0.3 mg/dL (ref 0.0–1.2)
CO2: 21 mmol/L (ref 20–29)
Calcium: 9.5 mg/dL (ref 8.7–10.3)
Chloride: 101 mmol/L (ref 96–106)
Creatinine, Ser: 1.07 mg/dL — ABNORMAL HIGH (ref 0.57–1.00)
Globulin, Total: 2.9 g/dL (ref 1.5–4.5)
Glucose: 172 mg/dL — ABNORMAL HIGH (ref 70–99)
Potassium: 4.4 mmol/L (ref 3.5–5.2)
Sodium: 140 mmol/L (ref 134–144)
Total Protein: 7.3 g/dL (ref 6.0–8.5)
eGFR: 56 mL/min/{1.73_m2} — ABNORMAL LOW (ref >59)

## 2024-02-25 LAB — LIPID PANEL
Cholesterol, Total: 130 mg/dL (ref 100–199)
HDL: 82 mg/dL (ref >39)
LDL CALC COMMENT:: 1.6 ratio (ref 0.0–4.4)
LDL Chol Calc (NIH): 34 mg/dL (ref 0–99)
Triglycerides: 69 mg/dL (ref 0–149)
VLDL Cholesterol Cal: 14 mg/dL (ref 5–40)

## 2024-02-25 MED ORDER — PROMETHAZINE HCL 25 MG PO TABS
25.0000 mg | ORAL_TABLET | Freq: Four times a day (QID) | ORAL | 2 refills | Status: DC | PRN
Start: 2024-02-25 — End: 2024-04-19

## 2024-02-25 NOTE — Telephone Encounter (Signed)
 Please let the patient know that I sent their prescription to their pharmacy. Thanks, WS

## 2024-02-25 NOTE — Telephone Encounter (Signed)
 This RN spoke with the patient and she states that she didn't need anything else but for an alternative to the Zofran  to be prescribed due to insurance not covering it. Patient had no further questions or concerns.

## 2024-02-25 NOTE — Telephone Encounter (Signed)
Pt notified.    LS

## 2024-02-25 NOTE — Telephone Encounter (Signed)
 2nd call attempt-- 03:09 pm No answer--Left a voicemail  3rd call attempt-- 03:39 pm  No answer---Left a voicemail   3 attempts were made---This RN was going to verify with patient that all she needed was an alternative medication prescription sent in & that she didn't need anything else but unable to reach patient.

## 2024-02-25 NOTE — Telephone Encounter (Signed)
 Copied from CRM [id number]. Topic: Clinical - Prescription Issue >> Feb 25, 2024 10:15 AM Alpha [employer] wrote: Reason for CRM: Patient states her prescription Zofran  is not covered by her insurance and would like something else sent in.  Callback #: [id number]  Preferred Pharmacy: [facility] - [facility], [place] - [address] [place] [address] Phone: [phone] Fax: [phone] Hours: Not open 24 hours    1st attempt at calling patient--No answer. Left a voicemail.

## 2024-02-25 NOTE — Telephone Encounter (Signed)
 Message sent to provider asking for alternative. LS

## 2024-02-26 DIAGNOSIS — C50912 Malignant neoplasm of unspecified site of left female breast: Secondary | ICD-10-CM | POA: Diagnosis not present

## 2024-02-26 DIAGNOSIS — C773 Secondary and unspecified malignant neoplasm of axilla and upper limb lymph nodes: Secondary | ICD-10-CM | POA: Diagnosis not present

## 2024-02-28 ENCOUNTER — Encounter: Payer: Self-pay | Admitting: Family Medicine

## 2024-02-28 NOTE — Progress Notes (Signed)
Hello Lyrica,  Your lab result is normal and/or stable.Some minor variations that are not significant are commonly marked abnormal, but do not represent any medical problem for you.  Best regards, Claretta Fraise, M.D.

## 2024-03-04 DIAGNOSIS — C50911 Malignant neoplasm of unspecified site of right female breast: Secondary | ICD-10-CM | POA: Diagnosis not present

## 2024-03-04 DIAGNOSIS — R110 Nausea: Secondary | ICD-10-CM | POA: Diagnosis not present

## 2024-03-04 DIAGNOSIS — Z5181 Encounter for therapeutic drug level monitoring: Secondary | ICD-10-CM | POA: Diagnosis not present

## 2024-03-04 DIAGNOSIS — Z79811 Long term (current) use of aromatase inhibitors: Secondary | ICD-10-CM | POA: Diagnosis not present

## 2024-03-04 DIAGNOSIS — D701 Agranulocytosis secondary to cancer chemotherapy: Secondary | ICD-10-CM | POA: Diagnosis not present

## 2024-03-04 DIAGNOSIS — Z79899 Other long term (current) drug therapy: Secondary | ICD-10-CM | POA: Diagnosis not present

## 2024-03-04 DIAGNOSIS — T451X5A Adverse effect of antineoplastic and immunosuppressive drugs, initial encounter: Secondary | ICD-10-CM | POA: Diagnosis not present

## 2024-03-04 DIAGNOSIS — C773 Secondary and unspecified malignant neoplasm of axilla and upper limb lymph nodes: Secondary | ICD-10-CM | POA: Diagnosis not present

## 2024-03-04 DIAGNOSIS — C7951 Secondary malignant neoplasm of bone: Secondary | ICD-10-CM | POA: Diagnosis not present

## 2024-03-04 DIAGNOSIS — C50912 Malignant neoplasm of unspecified site of left female breast: Secondary | ICD-10-CM | POA: Diagnosis not present

## 2024-03-04 DIAGNOSIS — D84821 Immunodeficiency due to drugs: Secondary | ICD-10-CM | POA: Diagnosis not present

## 2024-03-04 DIAGNOSIS — T383X5A Adverse effect of insulin and oral hypoglycemic [antidiabetic] drugs, initial encounter: Secondary | ICD-10-CM | POA: Diagnosis not present

## 2024-03-04 DIAGNOSIS — Z7985 Long-term (current) use of injectable non-insulin antidiabetic drugs: Secondary | ICD-10-CM | POA: Diagnosis not present

## 2024-03-04 DIAGNOSIS — E611 Iron deficiency: Secondary | ICD-10-CM | POA: Diagnosis not present

## 2024-03-07 DIAGNOSIS — G894 Chronic pain syndrome: Secondary | ICD-10-CM | POA: Diagnosis not present

## 2024-03-07 DIAGNOSIS — M62838 Other muscle spasm: Secondary | ICD-10-CM | POA: Diagnosis not present

## 2024-03-15 ENCOUNTER — Telehealth: Payer: Self-pay | Admitting: Family Medicine

## 2024-03-19 ENCOUNTER — Other Ambulatory Visit: Payer: Self-pay | Admitting: Family Medicine

## 2024-03-21 DIAGNOSIS — I10 Essential (primary) hypertension: Secondary | ICD-10-CM | POA: Diagnosis not present

## 2024-03-21 DIAGNOSIS — M545 Low back pain, unspecified: Secondary | ICD-10-CM | POA: Diagnosis not present

## 2024-03-21 DIAGNOSIS — E119 Type 2 diabetes mellitus without complications: Secondary | ICD-10-CM | POA: Diagnosis not present

## 2024-03-21 DIAGNOSIS — C7951 Secondary malignant neoplasm of bone: Secondary | ICD-10-CM | POA: Diagnosis not present

## 2024-03-21 DIAGNOSIS — E559 Vitamin D deficiency, unspecified: Secondary | ICD-10-CM | POA: Diagnosis not present

## 2024-03-21 DIAGNOSIS — R0602 Shortness of breath: Secondary | ICD-10-CM | POA: Diagnosis not present

## 2024-03-21 DIAGNOSIS — C50919 Malignant neoplasm of unspecified site of unspecified female breast: Secondary | ICD-10-CM | POA: Diagnosis not present

## 2024-03-21 DIAGNOSIS — G8929 Other chronic pain: Secondary | ICD-10-CM | POA: Diagnosis not present

## 2024-03-30 ENCOUNTER — Ambulatory Visit (INDEPENDENT_AMBULATORY_CARE_PROVIDER_SITE_OTHER)

## 2024-03-30 DIAGNOSIS — E118 Type 2 diabetes mellitus with unspecified complications: Secondary | ICD-10-CM

## 2024-03-30 LAB — HM DIABETES EYE EXAM

## 2024-03-30 NOTE — Progress Notes (Signed)
 Diana Mathews arrived 03/30/2024 and has given verbal consent to obtain images and complete their overdue diabetic retinal screening.  The images have been sent to an ophthalmologist or optometrist for review and interpretation.  Results will be sent back to Roise Cleaver, MD for review.  Patient has been informed they will be contacted when we receive the results via telephone or MyChart

## 2024-04-01 DIAGNOSIS — I10 Essential (primary) hypertension: Secondary | ICD-10-CM | POA: Diagnosis not present

## 2024-04-01 DIAGNOSIS — D84821 Immunodeficiency due to drugs: Secondary | ICD-10-CM | POA: Diagnosis not present

## 2024-04-01 DIAGNOSIS — E118 Type 2 diabetes mellitus with unspecified complications: Secondary | ICD-10-CM | POA: Diagnosis not present

## 2024-04-01 DIAGNOSIS — T451X5A Adverse effect of antineoplastic and immunosuppressive drugs, initial encounter: Secondary | ICD-10-CM | POA: Diagnosis not present

## 2024-04-01 DIAGNOSIS — C50912 Malignant neoplasm of unspecified site of left female breast: Secondary | ICD-10-CM | POA: Diagnosis not present

## 2024-04-01 DIAGNOSIS — Z79899 Other long term (current) drug therapy: Secondary | ICD-10-CM | POA: Diagnosis not present

## 2024-04-01 DIAGNOSIS — D701 Agranulocytosis secondary to cancer chemotherapy: Secondary | ICD-10-CM | POA: Diagnosis not present

## 2024-04-01 DIAGNOSIS — C773 Secondary and unspecified malignant neoplasm of axilla and upper limb lymph nodes: Secondary | ICD-10-CM | POA: Diagnosis not present

## 2024-04-01 DIAGNOSIS — C7951 Secondary malignant neoplasm of bone: Secondary | ICD-10-CM | POA: Diagnosis not present

## 2024-04-07 DIAGNOSIS — M62838 Other muscle spasm: Secondary | ICD-10-CM | POA: Diagnosis not present

## 2024-04-07 DIAGNOSIS — G894 Chronic pain syndrome: Secondary | ICD-10-CM | POA: Diagnosis not present

## 2024-04-16 ENCOUNTER — Other Ambulatory Visit: Payer: Self-pay | Admitting: Family Medicine

## 2024-04-21 DIAGNOSIS — E7800 Pure hypercholesterolemia, unspecified: Secondary | ICD-10-CM | POA: Diagnosis not present

## 2024-04-21 DIAGNOSIS — Z79899 Other long term (current) drug therapy: Secondary | ICD-10-CM | POA: Diagnosis not present

## 2024-04-21 DIAGNOSIS — G894 Chronic pain syndrome: Secondary | ICD-10-CM | POA: Diagnosis not present

## 2024-04-21 DIAGNOSIS — G8929 Other chronic pain: Secondary | ICD-10-CM | POA: Diagnosis not present

## 2024-04-21 DIAGNOSIS — E559 Vitamin D deficiency, unspecified: Secondary | ICD-10-CM | POA: Diagnosis not present

## 2024-04-21 DIAGNOSIS — E119 Type 2 diabetes mellitus without complications: Secondary | ICD-10-CM | POA: Diagnosis not present

## 2024-04-21 DIAGNOSIS — M545 Low back pain, unspecified: Secondary | ICD-10-CM | POA: Diagnosis not present

## 2024-05-03 DIAGNOSIS — E611 Iron deficiency: Secondary | ICD-10-CM | POA: Diagnosis not present

## 2024-05-03 DIAGNOSIS — C50912 Malignant neoplasm of unspecified site of left female breast: Secondary | ICD-10-CM | POA: Diagnosis not present

## 2024-05-03 DIAGNOSIS — C7951 Secondary malignant neoplasm of bone: Secondary | ICD-10-CM | POA: Diagnosis not present

## 2024-05-03 DIAGNOSIS — C773 Secondary and unspecified malignant neoplasm of axilla and upper limb lymph nodes: Secondary | ICD-10-CM | POA: Diagnosis not present

## 2024-05-07 DIAGNOSIS — M62838 Other muscle spasm: Secondary | ICD-10-CM | POA: Diagnosis not present

## 2024-05-07 DIAGNOSIS — G894 Chronic pain syndrome: Secondary | ICD-10-CM | POA: Diagnosis not present

## 2024-05-19 ENCOUNTER — Other Ambulatory Visit: Payer: Self-pay | Admitting: Family Medicine

## 2024-05-20 DIAGNOSIS — G894 Chronic pain syndrome: Secondary | ICD-10-CM | POA: Diagnosis not present

## 2024-05-20 DIAGNOSIS — I10 Essential (primary) hypertension: Secondary | ICD-10-CM | POA: Diagnosis not present

## 2024-05-20 DIAGNOSIS — D649 Anemia, unspecified: Secondary | ICD-10-CM | POA: Diagnosis not present

## 2024-05-20 DIAGNOSIS — G8929 Other chronic pain: Secondary | ICD-10-CM | POA: Diagnosis not present

## 2024-05-20 DIAGNOSIS — M545 Low back pain, unspecified: Secondary | ICD-10-CM | POA: Diagnosis not present

## 2024-05-20 DIAGNOSIS — E119 Type 2 diabetes mellitus without complications: Secondary | ICD-10-CM | POA: Diagnosis not present

## 2024-05-20 DIAGNOSIS — K760 Fatty (change of) liver, not elsewhere classified: Secondary | ICD-10-CM | POA: Diagnosis not present

## 2024-05-20 DIAGNOSIS — R29818 Other symptoms and signs involving the nervous system: Secondary | ICD-10-CM | POA: Diagnosis not present

## 2024-05-30 ENCOUNTER — Ambulatory Visit: Admitting: Family Medicine

## 2024-06-06 ENCOUNTER — Ambulatory Visit: Admitting: Family Medicine

## 2024-06-07 DIAGNOSIS — M62838 Other muscle spasm: Secondary | ICD-10-CM | POA: Diagnosis not present

## 2024-06-07 DIAGNOSIS — G894 Chronic pain syndrome: Secondary | ICD-10-CM | POA: Diagnosis not present

## 2024-06-14 ENCOUNTER — Ambulatory Visit: Admitting: Family Medicine

## 2024-06-18 ENCOUNTER — Other Ambulatory Visit: Payer: Self-pay | Admitting: Family Medicine

## 2024-06-18 DIAGNOSIS — I4811 Longstanding persistent atrial fibrillation: Secondary | ICD-10-CM

## 2024-06-21 DIAGNOSIS — E119 Type 2 diabetes mellitus without complications: Secondary | ICD-10-CM | POA: Diagnosis not present

## 2024-06-21 DIAGNOSIS — I10 Essential (primary) hypertension: Secondary | ICD-10-CM | POA: Diagnosis not present

## 2024-06-21 DIAGNOSIS — M545 Low back pain, unspecified: Secondary | ICD-10-CM | POA: Diagnosis not present

## 2024-06-21 DIAGNOSIS — I4891 Unspecified atrial fibrillation: Secondary | ICD-10-CM | POA: Diagnosis not present

## 2024-06-21 DIAGNOSIS — C50919 Malignant neoplasm of unspecified site of unspecified female breast: Secondary | ICD-10-CM | POA: Diagnosis not present

## 2024-06-21 DIAGNOSIS — C50912 Malignant neoplasm of unspecified site of left female breast: Secondary | ICD-10-CM | POA: Diagnosis not present

## 2024-06-21 DIAGNOSIS — G8929 Other chronic pain: Secondary | ICD-10-CM | POA: Diagnosis not present

## 2024-06-21 DIAGNOSIS — C7951 Secondary malignant neoplasm of bone: Secondary | ICD-10-CM | POA: Diagnosis not present

## 2024-06-23 ENCOUNTER — Telehealth: Payer: Self-pay | Admitting: Family Medicine

## 2024-06-23 NOTE — Telephone Encounter (Unsigned)
 Copied from CRM [id number]. Topic: Clinical - Home Health Verbal Orders >> Jun 23, 2024  1:07 PM Diana Mathews wrote: Pt called reporting that home health aid is faxing over documents for her PCP  She does not know the name of the agency. She just says the caller said her name was Consulting civil engineer.

## 2024-06-23 NOTE — Telephone Encounter (Signed)
Fyi noted.

## 2024-06-28 NOTE — Telephone Encounter (Signed)
 PCS forms brought to office today. Pt's last OV ws 02/24/24, the date of the last visit must be <90 days. Pt has a scheduled visit on 07/06/24 will hold onto ppw till then.

## 2024-07-01 DIAGNOSIS — R944 Abnormal results of kidney function studies: Secondary | ICD-10-CM | POA: Diagnosis not present

## 2024-07-01 DIAGNOSIS — M7989 Other specified soft tissue disorders: Secondary | ICD-10-CM | POA: Diagnosis not present

## 2024-07-01 DIAGNOSIS — Z79811 Long term (current) use of aromatase inhibitors: Secondary | ICD-10-CM | POA: Diagnosis not present

## 2024-07-01 DIAGNOSIS — R2243 Localized swelling, mass and lump, lower limb, bilateral: Secondary | ICD-10-CM | POA: Diagnosis not present

## 2024-07-01 DIAGNOSIS — C773 Secondary and unspecified malignant neoplasm of axilla and upper limb lymph nodes: Secondary | ICD-10-CM | POA: Diagnosis not present

## 2024-07-01 DIAGNOSIS — D84821 Immunodeficiency due to drugs: Secondary | ICD-10-CM | POA: Diagnosis not present

## 2024-07-01 DIAGNOSIS — T451X5A Adverse effect of antineoplastic and immunosuppressive drugs, initial encounter: Secondary | ICD-10-CM | POA: Diagnosis not present

## 2024-07-01 DIAGNOSIS — D701 Agranulocytosis secondary to cancer chemotherapy: Secondary | ICD-10-CM | POA: Diagnosis not present

## 2024-07-01 DIAGNOSIS — Z79899 Other long term (current) drug therapy: Secondary | ICD-10-CM | POA: Diagnosis not present

## 2024-07-01 DIAGNOSIS — C50912 Malignant neoplasm of unspecified site of left female breast: Secondary | ICD-10-CM | POA: Diagnosis not present

## 2024-07-01 DIAGNOSIS — C7951 Secondary malignant neoplasm of bone: Secondary | ICD-10-CM | POA: Diagnosis not present

## 2024-07-01 DIAGNOSIS — R600 Localized edema: Secondary | ICD-10-CM | POA: Diagnosis not present

## 2024-07-01 DIAGNOSIS — Z87891 Personal history of nicotine dependence: Secondary | ICD-10-CM | POA: Diagnosis not present

## 2024-07-04 ENCOUNTER — Ambulatory Visit: Payer: 59

## 2024-07-04 DIAGNOSIS — E118 Type 2 diabetes mellitus with unspecified complications: Secondary | ICD-10-CM

## 2024-07-04 NOTE — Progress Notes (Signed)
 Subjective:   Diana Mathews is a 72 y.o. who presents for a Medicare Wellness preventive visit.  As a reminder, Annual Wellness Visits don't include a physical exam, and some assessments may be limited, especially if this visit is performed virtually. We may recommend an in-person follow-up visit with your provider if needed.  Visit Complete: Virtual I connected with  Diana Mathews on 07/04/24 by a audio enabled telemedicine application and verified that I am speaking with the correct person using two identifiers.  Patient Location: Home  Provider Location: Home Office  I discussed the limitations of evaluation and management by telemedicine. The patient expressed understanding and agreed to proceed.  Vital Signs: Because this visit was a virtual/telehealth visit, some criteria may be missing or patient reported. Any vitals not documented were not able to be obtained and vitals that have been documented are patient reported.  VideoDeclined- This patient declined Librarian, academic. Therefore the visit was completed with audio only.  Persons Participating in Visit: Patient.  AWV Questionnaire: Yes: Patient Medicare AWV questionnaire was completed by the patient on 06/30/2024; I have confirmed that all information answered by patient is correct and no changes since this date.  Cardiac Risk Factors include: advanced age (>64men, >46 women);hypertension;diabetes mellitus;dyslipidemia;obesity (BMI >30kg/m2)     Objective:    Today's Vitals   06/30/24 0926 07/04/24 1343  Weight:  204 lb (92.5 kg)  Height:  5' 3 (1.6 m)  PainSc: 6     Body mass index is 36.14 kg/m.     07/04/2024    1:48 PM 07/03/2023    1:04 PM 05/12/2022    2:53 PM 05/10/2021    2:14 PM 01/11/2020   11:40 AM 01/10/2019   11:04 AM 06/30/2018    9:24 AM  Advanced Directives  Does Patient Have a Medical Advance Directive? No No No No No No  No   Would patient like information on  creating a medical advance directive?  Yes (MAU/Ambulatory/Procedural Areas - Information given) No - Patient declined No - Patient declined No - Patient declined No - Patient declined       Data saved with a previous flowsheet row definition    Current Medications (verified) Outpatient Encounter Medications as of 07/04/2024  Medication Sig   apixaban  (ELIQUIS ) 5 MG TABS tablet Take 1 tablet (5 mg total) by mouth 2 (two) times daily.   ARIPiprazole  (ABILIFY ) 5 MG tablet TAKE ONE (1) TABLET EACH DAY   atorvastatin  (LIPITOR) 80 MG tablet TAKE ONE (1) TABLET EACH DAY   baclofen (LIORESAL) 10 MG tablet Take 10 mg by mouth 4 (four) times daily.   Blood Glucose Monitoring Suppl DEVI 1 each by Does not apply route in the morning, at noon, and at bedtime. May substitute to any manufacturer covered by patient's insurance.   busPIRone  (BUSPAR ) 10 MG tablet TAKE ONE TABLET BY MOUTH THREE TIMES DAILY AS NEEDED   carvedilol  (COREG ) 6.25 MG tablet TAKE 1 TABLET BY MOUTH TWICE DAILY WITH FOOD   diltiazem  (CARDIZEM  CD) 240 MG 24 hr capsule TAKE ONE (1) CAPSULE BY MOUTH 2 TIMES DAILY   diphenhydrAMINE (BENADRYL) 25 mg capsule Take 25 mg by mouth at bedtime as needed.   DULoxetine  (CYMBALTA ) 60 MG capsule TAKE 1 CAPSULE EVERY MORNING AND TAKE 1 CAPSULE AT BEDTIME   glucose blood (ONETOUCH VERIO) test strip CHECK MORNING, NOON, & BEDTIME Dx E11.8   HYDROcodone-acetaminophen  (NORCO) 10-325 MG tablet Take 1 tablet by mouth every  6 (six) hours as needed.   letrozole (FEMARA) 2.5 MG tablet Take 2.5 mg by mouth daily.   metFORMIN  (GLUCOPHAGE -XR) 750 MG 24 hr tablet TAKE ONE (1) TABLET BY MOUTH TWO (2) TIMES DAILY   palbociclib (IBRANCE) 125 MG tablet Take by mouth.   promethazine  (PHENERGAN ) 25 MG tablet TAKE 1 TABLET BY MOUTH EVERY 6 HOURS AS NEEDED FOR NAUSEA & VOMITING   rOPINIRole  (REQUIP ) 1 MG tablet Take 1 tablet (1 mg total) by mouth at bedtime.   tirzepatide  (MOUNJARO ) 5 MG/0.5ML Pen Inject 5 mg into the skin  once a week.   trazodone  (DESYREL ) 300 MG tablet TAKE 1/3 TO 1 TABLET AT BEDTIME AS NEEDED FOR SLEEP   TRUEplus Lancets 33G MISC Test BS QID Dx E11.8   amoxicillin -clavulanate (AUGMENTIN ) 875-125 MG tablet Take 1 tablet by mouth 2 (two) times daily. Take all of this medication (Patient not taking: Reported on 07/04/2024)   benzonatate  (TESSALON ) 200 MG capsule Take 1 capsule (200 mg total) by mouth 3 (three) times daily as needed for cough. (Patient not taking: Reported on 07/04/2024)   No facility-administered encounter medications on file as of 07/04/2024.    Allergies (verified) Codeine   History: Past Medical History:  Diagnosis Date   Atrial fibrillation (HCC) 06/12/2018   Depression    Essential hypertension    Headache    Hyperlipidemia    Metastatic breast cancer    Left breast, metastatic to bone and with pulmonary nodules - [facility]   Osteoarthritis    Sleep apnea    TIA (transient ischemic attack)    Type 2 diabetes mellitus (HCC)    Past Surgical History:  Procedure Laterality Date   ABDOMINAL HYSTERECTOMY     CHOLECYSTECTOMY     FOOT SURGERY Left    HERNIA REPAIR     TUBAL LIGATION     WRIST SURGERY Bilateral    Carpal Tunnel   Family History  Problem Relation Age of Onset   Arthritis Mother    COPD Mother    Diabetes Mother    Hypertension Mother    Arthritis Sister    Diabetes Brother    Arthritis Sister    Diabetes Sister    Diabetes Sister    Diabetes Brother    Mental illness Brother    Social History   Socioeconomic History   Marital status: Divorced    Spouse name: Not on file   Number of children: 3   Years of education: 13   Highest education level: Associate degree: occupational, Scientist, product/process development, or vocational program  Occupational History   Occupation: Retired  Tobacco Use   Smoking status: Former    Current packs/day: 0.00    Types: Cigarettes    Quit date: 01/09/1985    Years since quitting: 39.5   Smokeless tobacco: Never   Vaping Use   Vaping status: Never Used  Substance and Sexual Activity   Alcohol  use: No    Alcohol /week: 0.0 standard drinks of alcohol    Drug use: No   Sexual activity: Not Currently    Birth control/protection: Surgical  Other Topics Concern   Not on file  Social History Narrative   Lives alone. /2025 Children and family live nearby   Social Drivers of Health   Financial Resource Strain: Low Risk  (07/04/2024)   Overall Financial Resource Strain (CARDIA)    Difficulty of Paying Living Expenses: Not hard at all  Food Insecurity: No Food Insecurity (07/04/2024)   Hunger Vital Sign  Worried About Programme researcher, broadcasting/film/video in the Last Year: Never true    Ran Out of Food in the Last Year: Never true  Recent Concern: Food Insecurity - Food Insecurity Present (06/10/2024)   Hunger Vital Sign    Worried About Running Out of Food in the Last Year: Often true    Ran Out of Food in the Last Year: Often true  Transportation Needs: No Transportation Needs (07/04/2024)   PRAPARE - Administrator, Civil Service (Medical): No    Lack of Transportation (Non-Medical): No  Recent Concern: Transportation Needs - Unmet Transportation Needs (06/10/2024)   PRAPARE - Transportation    Lack of Transportation (Medical): Yes    Lack of Transportation (Non-Medical): Yes  Physical Activity: Inactive (07/04/2024)   Exercise Vital Sign    Days of Exercise per Week: Not on file    Minutes of Exercise per Session: 0 min  Stress: No Stress Concern Present (07/04/2024)   [employer] of Occupational Health - Occupational Stress Questionnaire    Feeling of Stress: Not at all  Recent Concern: Stress - Stress Concern Present (06/10/2024)   [employer] of Occupational Health - Occupational Stress Questionnaire    Feeling of Stress: To some extent  Social Connections: Socially Isolated (07/04/2024)   Social Connection and Isolation Panel    Frequency of Communication with Friends and Family: More than  three times a week    Frequency of Social Gatherings with Friends and Family: More than three times a week    Attends Religious Services: Never    Database administrator or Organizations: No    Attends Engineer, structural: Never    Marital Status: Divorced    Tobacco Counseling Counseling given: Not Answered    Clinical Intake:     Pain : 0-10 Pain Score: 6  Pain Type: Chronic pain Pain Location: Leg Pain Descriptors / Indicators: Aching, Discomfort Pain Onset: 1 to 4 weeks ago Pain Frequency: Intermittent Pain Relieving Factors: Hydrocodone  Pain Relieving Factors: Hydrocodone  BMI - recorded: 36.14 Nutritional Status: BMI > 30  Obese Nutritional Risks: None Diabetes: Yes CBG done?: Yes (116 -per pt) CBG resulted in Enter/ Edit results?: No Did pt. bring in CBG monitor from home?: No  Lab Results  Component Value Date   HGBA1C 7.2 (H) 02/24/2024   HGBA1C 6.7 (H) 11/02/2023   HGBA1C 6.2 (H) 07/30/2023     How often do you need to have someone help you when you read instructions, pamphlets, or other written materials from your doctor or pharmacy?: 1 - Never  Interpreter Needed?: No  Information entered by :: Chayse Zatarain, RMA   Activities of Daily Living     06/30/2024    9:26 AM  In your present state of health, do you have any difficulty performing the following activities:  Hearing? 1  Vision? 0  Difficulty concentrating or making decisions? 1  Walking or climbing stairs? 1  Dressing or bathing? 0  Doing errands, shopping? 1  Preparing Food and eating ? Y  Using the Toilet? N  In the past six months, have you accidently leaked urine? N  Do you have problems with loss of bowel control? N  Managing your Medications? N  Managing your Finances? N  Housekeeping or managing your Housekeeping? Y    Patient Care Team: Zollie Lowers, MD as PCP - General (Family Medicine) Debera Jayson MATSU, MD as PCP - Cardiology (Cardiology) Girard Norway, MD as Referring  Physician (Internal Medicine) Asuncion Setter, PA-C (Physician Assistant) Ladora Ross Lacy Phebe, MD as Referring Physician (Optometry)  I have updated your Care Teams any recent Medical Services you may have received from other providers in the past year.     Assessment:   This is a routine wellness examination for Diana Mathews.  Hearing/Vision screen Hearing Screening - Comments:: Has some hearing issues-per pt Vision Screening - Comments:: Wears eyeglasses/ [facility]/    Goals Addressed             This Visit's Progress    Exercise 150 min/wk Moderate Activity   On track    Stay active and independent Watch grandchildren grow up       Depression Screen     07/04/2024    1:51 PM 02/24/2024   10:06 AM 07/30/2023    9:54 AM 07/30/2023    9:36 AM 07/03/2023    1:03 PM 04/20/2023   10:25 AM 12/17/2022   10:20 AM  PHQ 2/9 Scores  PHQ - 2 Score 1 1 4  0 0 5 3  PHQ- 9 Score 2 6 16   11 9     Fall Risk     06/30/2024    9:26 AM 07/30/2023    9:36 AM 07/03/2023    1:01 PM 06/18/2023    9:32 AM 05/10/2023   11:13 AM  Fall Risk   Falls in the past year? 0 0 0 0 0  Number falls in past yr: 0  0 0 0  Injury with Fall? 0  0 0 0  Risk for fall due to :   No Fall Risks    Follow up Falls evaluation completed;Falls prevention discussed  Falls prevention discussed      MEDICARE RISK AT HOME:  Medicare Risk at Home Any stairs in or around the home?: (Patient-Rptd) No If so, are there any without handrails?: (Patient-Rptd) No Home free of loose throw rugs in walkways, pet beds, electrical cords, etc?: (Patient-Rptd) Yes Adequate lighting in your home to reduce risk of falls?: (Patient-Rptd) Yes Life alert?: (Patient-Rptd) No Use of a cane, walker or w/c?: (Patient-Rptd) Yes Grab bars in the bathroom?: (Patient-Rptd) Yes Shower chair or bench in shower?: (Patient-Rptd) Yes Elevated toilet seat or a handicapped toilet?: (Patient-Rptd) No  TIMED UP AND GO:  Was the test  performed?  No  Cognitive Function: Declined/Normal: No cognitive concerns noted by patient or family. Patient alert, oriented, able to answer questions appropriately and recall recent events. No signs of memory loss or confusion.    01/10/2019   11:08 AM  MMSE - Mini Mental State Exam  Orientation to time 5  Orientation to Place 5  Registration 3  Attention/ Calculation 3  Recall 2  Language- name 2 objects 2  Language- repeat 1  Language- follow 3 step command 3  Language- read & follow direction 1  Write a sentence 1  Copy design 1  Total score 27        07/03/2023    1:04 PM 05/12/2022    2:54 PM 05/10/2021    2:28 PM 01/11/2020   11:40 AM  6CIT Screen  What Year? 0 points 0 points 0 points 0 points  What month? 0 points 0 points 0 points 0 points  What time? 0 points 0 points 0 points 0 points  Count back from 20 0 points 0 points 4 points 4 points  Months in reverse 0 points 4 points 4 points 4 points  Repeat phrase 0  points 0 points 0 points 0 points  Total Score 0 points 4 points 8 points 8 points    Immunizations Immunization History  Administered Date(s) Administered   Fluad Quad(high Dose 65+) 08/24/2020, 08/08/2021, 08/25/2022   INFLUENZA, HIGH DOSE SEASONAL PF 07/28/2019   Influenza Inj Mdck Quad With Preservative 07/24/2023   Influenza,inj,Quad PF,6+ Mos 07/25/2016, 07/27/2018   Influenza-Unspecified 07/25/2016, 07/27/2018, 08/24/2020, 08/08/2021   Moderna SARS-COV2 Booster Vaccination 10/06/2020   Moderna Sars-Covid-2 Vaccination 01/18/2020, 02/13/2020   PNEUMOCOCCAL CONJUGATE-20 11/11/2021   Pneumococcal Conjugate-13 04/10/2020   Tdap 04/10/2020   Zoster Recombinant(Shingrix) 08/08/2021, 11/11/2021    Screening Tests Health Maintenance  Topic Date Due   Hepatitis C Screening  Never done   Colonoscopy  Never done   COVID-19 Vaccine (3 - Moderna risk series) 11/03/2020   Diabetic kidney evaluation - Urine ACR  04/19/2024   FOOT EXAM  04/19/2024    Influenza Vaccine  05/27/2024   Medicare Annual Wellness (AWV)  07/02/2024   HEMOGLOBIN A1C  08/25/2024   Diabetic kidney evaluation - eGFR measurement  02/23/2025   OPHTHALMOLOGY EXAM  03/30/2025   MAMMOGRAM  07/21/2025   DEXA SCAN  02/17/2026   DTaP/Tdap/Td (2 - Td or Tdap) 04/10/2030   Pneumococcal Vaccine: 50+ Years  Completed   Zoster Vaccines- Shingrix  Completed   HPV VACCINES  Aged Out   Meningococcal B Vaccine  Aged Out    Health Maintenance Items Addressed: Diabetic Foot Exam recommended, Labs Ordered: UACR, See Nurse Notes at the end of this note  Additional Screening:  Vision Screening: Recommended annual ophthalmology exams for early detection of glaucoma and other disorders of the eye. Is the patient up to date with their annual eye exam?  No  Who is the provider or what is the name of the office in which the patient attends annual eye exams?[facility]  Dental Screening: Recommended annual dental exams for proper oral hygiene  Community Resource Referral / Chronic Care Management: CRR required this visit?  No   CCM required this visit?  No   Plan:    I have personally reviewed and noted the following in the patient's chart:   Medical and social history Use of alcohol , tobacco or illicit drugs  Current medications and supplements including opioid prescriptions. Patient is currently taking opioid prescriptions. Information provided to patient regarding non-opioid alternatives. Patient advised to discuss non-opioid treatment plan with their provider. Functional ability and status Nutritional status Physical activity Advanced directives List of other physicians Hospitalizations, surgeries, and ER visits in previous 12 months Vitals Screenings to include cognitive, depression, and falls Referrals and appointments  In addition, I have reviewed and discussed with patient certain preventive protocols, quality metrics, and best practice recommendations. A written  personalized care plan for preventive services as well as general preventive health recommendations were provided to patient.   Diana Mathews, CMA   07/04/2024   After Visit Summary: (MyChart) Due to this being a telephonic visit, the after visit summary with patients personalized plan was offered to patient via MyChart   Notes: Patient is due for a Flu vaccine, a foot exam and UACR, which order has been placed today.  Patient would like to get her Flu vaccine and foot exam done during her next office visit.  She had no other concerns to address today.

## 2024-07-04 NOTE — Patient Instructions (Addendum)
 Ms. Harlacher,  Thank you for taking the time for your Medicare Wellness Visit. I appreciate your continued commitment to your health goals. Please review the care plan we discussed, and feel free to reach out if I can assist you further.  Medicare recommends these wellness visits once per year to help you and your care team stay ahead of potential health issues. These visits are designed to focus on prevention, allowing your provider to concentrate on managing your acute and chronic conditions during your regular appointments.  Please note that Annual Wellness Visits do not include a physical exam. Some assessments may be limited, especially if the visit was conducted virtually. If needed, we may recommend a separate in-person follow-up with your provider.  Ongoing Care Seeing your primary care provider every 3 to 6 months helps us  monitor your health and provide consistent, personalized care. Next office on 07/06/2024.  You are due for a Flu vaccine, a foot exam and a kidney evaluation (urine sample).  These things will all be done during your next office visit.  Referrals If a referral was made during today's visit and you haven't received any updates within two weeks, please contact the referred provider directly to check on the status.  Recommended Screenings:  Health Maintenance  Topic Date Due   Hepatitis C Screening  Never done   Colon Cancer Screening  Never done   COVID-19 Vaccine (3 - Moderna risk series) 11/03/2020   Yearly kidney health urinalysis for diabetes  04/19/2024   Complete foot exam   04/19/2024   Flu Shot  05/27/2024   Medicare Annual Wellness Visit  07/02/2024   Hemoglobin A1C  08/25/2024   Yearly kidney function blood test for diabetes  02/23/2025   Eye exam for diabetics  03/30/2025   Mammogram  07/21/2025   DEXA scan (bone density measurement)  02/17/2026   DTaP/Tdap/Td vaccine (2 - Td or Tdap) 04/10/2030   Pneumococcal Vaccine for age over 59  Completed    Zoster (Shingles) Vaccine  Completed   HPV Vaccine  Aged Out   Meningitis B Vaccine  Aged Out       07/04/2024    1:48 PM  Advanced Directives  Does Patient Have a Medical Advance Directive? No   Advance Care Planning is important because it: Ensures you receive medical care that aligns with your values, goals, and preferences. Provides guidance to your family and loved ones, reducing the emotional burden of decision-making during critical moments.  Vision: Annual vision screenings are recommended for early detection of glaucoma, cataracts, and diabetic retinopathy. These exams can also reveal signs of chronic conditions such as diabetes and high blood pressure.  Dental: Annual dental screenings help detect early signs of oral cancer, gum disease, and other conditions linked to overall health, including heart disease and diabetes.  Please see the attached documents for additional preventive care recommendations.  Managing Pain Without Opioids Opioids are strong medicines used to treat moderate to severe pain. For some people, especially those who have long-term (chronic) pain, opioids may not be the best choice for pain management due to: Side effects like nausea, constipation, and sleepiness. The risk of addiction (opioid use disorder). The longer you take opioids, the greater your risk of addiction. Pain that lasts for more than 3 months is called chronic pain. Managing chronic pain usually requires more than one approach and is often provided by a team of health care providers working together (multidisciplinary approach). Pain management may be done at a pain  management center or pain clinic. How to manage pain without the use of opioids Use non-opioid medicines Non-opioid medicines for pain may include: Over-the-counter or prescription non-steroidal anti-inflammatory drugs (NSAIDs). These may be the first medicines used for pain. They work well for muscle and bone pain, and they reduce  swelling. Acetaminophen . This over-the-counter medicine may work well for milder pain but not swelling. Antidepressants. These may be used to treat chronic pain. A certain type of antidepressant (tricyclics) is often used. These medicines are given in lower doses for pain than when used for depression. Anticonvulsants. These are usually used to treat seizures but may also reduce nerve (neuropathic) pain. Muscle relaxants. These relieve pain caused by sudden muscle tightening (spasms). You may also use a pain medicine that is applied to the skin as a patch, cream, or gel (topical analgesic), such as a numbing medicine. These may cause fewer side effects than medicines taken by mouth. Do certain therapies as directed Some therapies can help with pain management. They include: Physical therapy. You will do exercises to gain strength and flexibility. A physical therapist may teach you exercises to move and stretch parts of your body that are weak, stiff, or painful. You can learn these exercises at physical therapy visits and practice them at home. Physical therapy may also involve: Massage. Heat wraps or applying heat or cold to affected areas. Electrical signals that interrupt pain signals (transcutaneous electrical nerve stimulation, TENS). Weak lasers that reduce pain and swelling (low-level laser therapy). Signals from your body that help you learn to regulate pain (biofeedback). Occupational therapy. This helps you to learn ways to function at home and work with less pain. Recreational therapy. This involves trying new activities or hobbies, such as a physical activity or drawing. Mental health therapy, including: Cognitive behavioral therapy (CBT). This helps you learn coping skills for dealing with pain. Acceptance and commitment therapy (ACT) to change the way you think and react to pain. Relaxation therapies, including muscle relaxation exercises and mindfulness-based stress reduction. Pain  management counseling. This may be individual, family, or group counseling.  Receive medical treatments Medical treatments for pain management include: Nerve block injections. These may include a pain blocker and anti-inflammatory medicines. You may have injections: Near the spine to relieve chronic back or neck pain. Into joints to relieve back or joint pain. Into nerve areas that supply a painful area to relieve body pain. Into muscles (trigger point injections) to relieve some painful muscle conditions. A medical device placed near your spine to help block pain signals and relieve nerve pain or chronic back pain (spinal cord stimulation device). Acupuncture. Follow these instructions at home Medicines Take over-the-counter and prescription medicines only as told by your health care provider. If you are taking pain medicine, ask your health care providers about possible side effects to watch out for. Do not drive or use heavy machinery while taking prescription opioid pain medicine. Lifestyle  Do not use drugs or alcohol  to reduce pain. If you drink alcohol , limit how much you have to: 0-1 drink a day for women who are not pregnant. 0-2 drinks a day for men. Know how much alcohol  is in a drink. In the [place] one drink equals one 12 oz bottle of beer (355 mL), one 5 oz glass of wine (148 mL), or one 1 oz glass of hard liquor (44 mL). Do not use any products that contain nicotine or tobacco. These products include cigarettes, chewing tobacco, and vaping devices, such as e-cigarettes. If  you need help quitting, ask your health care provider. Eat a healthy diet and maintain a healthy weight. Poor diet and excess weight may make pain worse. Eat foods that are high in fiber. These include fresh fruits and vegetables, whole grains, and beans. Limit foods that are high in fat and processed sugars, such as fried and sweet foods. Exercise regularly. Exercise lowers stress and may help relieve  pain. Ask your health care provider what activities and exercises are safe for you. If your health care provider approves, join an exercise class that combines movement and stress reduction. Examples include yoga and tai chi. Get enough sleep. Lack of sleep may make pain worse. Lower stress as much as possible. Practice stress reduction techniques as told by your therapist. General instructions Work with all your pain management providers to find the treatments that work best for you. You are an important member of your pain management team. There are many things you can do to reduce pain on your own. Consider joining an online or in-person support group for people who have chronic pain. Keep all follow-up visits. This is important. Where to find more information You can find more information about managing pain without opioids from: American Academy of Pain Medicine: painmed.org Institute for Chronic Pain: instituteforchronicpain.org American Chronic Pain Association: theacpa.org Contact a health care provider if: You have side effects from pain medicine. Your pain gets worse or does not get better with treatments or home therapy. You are struggling with anxiety or depression. Summary Many types of pain can be managed without opioids. Chronic pain may respond better to pain management without opioids. Pain is best managed when you and a team of health care providers work together. Pain management without opioids may include non-opioid medicines, medical treatments, physical therapy, mental health therapy, and lifestyle changes. Tell your health care providers if your pain gets worse or is not being managed well enough. This information is not intended to replace advice given to you by your health care provider. Make sure you discuss any questions you have with your health care provider. Document Revised: 01/23/2021 Document Reviewed: 01/23/2021 Elsevier Patient Education  2024 [employer].

## 2024-07-06 ENCOUNTER — Ambulatory Visit: Admitting: Family Medicine

## 2024-07-06 ENCOUNTER — Encounter: Payer: Self-pay | Admitting: Family Medicine

## 2024-07-06 VITALS — BP 130/68 | HR 70 | Temp 98.0°F | Ht 63.0 in | Wt 245.0 lb

## 2024-07-06 DIAGNOSIS — E119 Type 2 diabetes mellitus without complications: Secondary | ICD-10-CM

## 2024-07-06 DIAGNOSIS — Z7985 Long-term (current) use of injectable non-insulin antidiabetic drugs: Secondary | ICD-10-CM

## 2024-07-06 DIAGNOSIS — E785 Hyperlipidemia, unspecified: Secondary | ICD-10-CM

## 2024-07-06 DIAGNOSIS — C7951 Secondary malignant neoplasm of bone: Secondary | ICD-10-CM | POA: Diagnosis not present

## 2024-07-06 DIAGNOSIS — M150 Primary generalized (osteo)arthritis: Secondary | ICD-10-CM

## 2024-07-06 DIAGNOSIS — Z130 Encounter for screening for diseases of the blood and blood-forming organs and certain disorders involving the immune mechanism: Secondary | ICD-10-CM | POA: Diagnosis not present

## 2024-07-06 DIAGNOSIS — I10 Essential (primary) hypertension: Secondary | ICD-10-CM | POA: Diagnosis not present

## 2024-07-06 DIAGNOSIS — I4811 Longstanding persistent atrial fibrillation: Secondary | ICD-10-CM

## 2024-07-06 DIAGNOSIS — Z23 Encounter for immunization: Secondary | ICD-10-CM | POA: Diagnosis not present

## 2024-07-06 DIAGNOSIS — R6889 Other general symptoms and signs: Secondary | ICD-10-CM | POA: Diagnosis not present

## 2024-07-06 DIAGNOSIS — E66813 Obesity, class 3: Secondary | ICD-10-CM

## 2024-07-06 LAB — BAYER DCA HB A1C WAIVED: HB A1C (BAYER DCA - WAIVED): 7.2 % — ABNORMAL HIGH (ref 4.8–5.6)

## 2024-07-06 LAB — LIPID PANEL

## 2024-07-06 MED ORDER — TIRZEPATIDE 7.5 MG/0.5ML ~~LOC~~ SOAJ: 7.5000 mg | SUBCUTANEOUS | 3 refills | Status: AC

## 2024-07-06 NOTE — Progress Notes (Unsigned)
 Subjective:  Patient ID: Diana Mathews, female    DOB: 07/13/1951  Age: 73 y.o. MRN: [id number]  CC: Medical Management of Chronic Issues   HPI  Discussed the use of AI scribe software for clinical note transcription with the patient, who gave verbal consent to proceed.  History of Present Illness Diana Mathews is a 73 year old female with diabetes and a history of breast cancer who presents with leg pain and difficulty walking.  She has been experiencing significant leg pain and difficulty walking since attending a birthday party on May 14, 2024. The pain is located behind her calves, rated as 7 out of 10 in severity, and occurs during walking but not at rest. She uses a cane for mobility, and pain medications have not been effective.  She recently visited the hospital for evaluation of a possible blood clot due to leg swelling. She reports that an ultrasound of her legs was performed to check for blood clots, and she was told the results were negative. The swelling has decreased slightly since then.  She has a history of diabetes and regularly monitors her blood sugar, which has been stable with morning readings around 116 mg/dL and afternoon readings not exceeding 130 mg/dL. She is taking atorvastatin  for cholesterol management without side effects.  She reports significant weight gain since her last visit and difficulty performing daily activities due to leg pain. She is taking Mounjaro  for weight management.  Her past medical history includes a stroke in August 2019, resulting in memory loss but no physical weakness or numbness. She also has a history of breast cancer and currently has two tumors on her tailbone. She is taking letrozole (Femara) daily, prescribed by her gynecologist.  No symptoms of urinary tract infection, such as burning during urination.          07/06/2024    3:06 PM 07/04/2024    1:51 PM 02/24/2024   10:06 AM  Depression screen PHQ 2/9  Decreased  Interest 0 0 1  Down, Depressed, Hopeless 1 1 0  PHQ - 2 Score 1 1 1   Altered sleeping 0 0 1  Tired, decreased energy 1 0 1  Change in appetite 1 1 1   Feeling bad or failure about yourself  1 0 1  Trouble concentrating 0 0 1  Moving slowly or fidgety/restless 0 0 0  Suicidal thoughts 0 0 0  PHQ-9 Score 4 2 6   Difficult doing work/chores Somewhat difficult Not difficult at all Somewhat difficult    History Diana Mathews has a past medical history of Atrial fibrillation (HCC) (06/12/2018), Depression, Essential hypertension, Headache, Hyperlipidemia, Metastatic breast cancer, Osteoarthritis, Sleep apnea, TIA (transient ischemic attack), and Type 2 diabetes mellitus (HCC).   She has a past surgical history that includes Abdominal hysterectomy; Wrist surgery (Bilateral); Hernia repair; Cholecystectomy; Tubal ligation; and Foot surgery (Left).   Her family history includes Anxiety disorder in her sister; Arthritis in her mother, sister, and sister; COPD in her mother; Depression in her sister; Diabetes in her brother, brother, mother, sister, and sister; Hypertension in her mother; Mental illness in her brother; Obesity in her brother; Stroke in her mother.She reports that she quit smoking about 39 years ago. Her smoking use included cigarettes. She has never used smokeless tobacco. She reports that she does not drink alcohol  and does not use drugs.    ROS Review of Systems  Constitutional: Negative.   HENT: Negative.    Eyes:  Negative for visual disturbance.  Respiratory:  Negative for shortness of breath.   Cardiovascular:  Negative for chest pain.  Gastrointestinal:  Negative for abdominal pain.  Musculoskeletal:  Negative for arthralgias.    Objective:  BP 130/68   Pulse 70   Temp 98 F (36.7 C)   Ht 5' 3 (1.6 m)   Wt 245 lb (111.1 kg)   SpO2 92%   BMI 43.40 kg/m   BP Readings from Last 3 Encounters:  07/06/24 130/68  02/24/24 114/60  11/02/23 130/79    Wt Readings from  Last 3 Encounters:  07/06/24 245 lb (111.1 kg)  07/04/24 204 lb (92.5 kg)  02/24/24 204 lb (92.5 kg)     Physical Exam Constitutional:      General: She is not in acute distress.    Appearance: She is well-developed.  Cardiovascular:     Rate and Rhythm: Normal rate and regular rhythm.  Pulmonary:     Breath sounds: Normal breath sounds.  Musculoskeletal:        General: Normal range of motion.  Skin:    General: Skin is warm and dry.  Neurological:     Mental Status: She is alert and oriented to person, place, and time.      Assessment & Plan:  Encounter for immunization -     Flu vaccine HIGH DOSE PF(Fluzone Trivalent)  Diabetes mellitus treated with injections of non-insulin  medication (HCC) -     CMP14+EGFR -     Bayer DCA Hb A1c Waived -     Microalbumin / creatinine urine ratio -     Vitamin B12  Essential hypertension -     CBC with Differential/Platelet  Hyperlipidemia, unspecified hyperlipidemia type -     Lipid panel  Malignant neoplasm metastatic to bone (HCC)  Primary osteoarthritis involving multiple joints  Obesity, Class III, BMI 40-49.9 (morbid obesity)  Longstanding persistent atrial fibrillation (HCC)  Other orders -     Tirzepatide ; Inject 7.5 mg into the skin once a week.  Dispense: 6 mL; Refill: 3    Assessment and Plan Assessment & Plan Chronic pain of bilateral lower legs with difficulty ambulating   She experiences chronic pain in the bilateral lower legs, mainly in the calves, with a pain level of 7/10 during walking, absent at rest. Recent evaluation ruled out deep venous thrombosis. The pain's cause is unclear, and current pain management is inadequate. She reports increased difficulty with ambulation and weight gain since the last visit. Consider further evaluation if symptoms persist or worsen. Discuss the potential need for personal care services due to mobility issues.  Type 2 diabetes mellitus   Her type 2 diabetes mellitus  shows a recent A1c of 7.2. Home blood glucose levels are well-controlled, with morning readings at 116 mg/dL and afternoon readings not exceeding 130 mg/dL. Current management includes Mounjaro  injections, with plans to increase the dosage from 5 mg to 7.5 mg to improve glycemic control and assist with weight management. Monitor blood glucose levels regularly and ensure Mounjaro  prescription approval and availability at the pharmacy.  Essential hypertension   Her blood pressure is well-controlled at 130/68 mmHg.  Hyperlipidemia   Hyperlipidemia is managed with atorvastatin , which she tolerates well without side effects. The current regimen effectively maintains cholesterol levels. Continue atorvastatin  as prescribed.       Follow-up: Return in about 3 months (around 10/05/2024).  Butler Der, M.D.

## 2024-07-07 LAB — CMP14+EGFR
ALT: 19 IU/L (ref 0–32)
AST: 21 IU/L (ref 0–40)
Albumin: 3.9 g/dL (ref 3.9–4.9)
Alkaline Phosphatase: 86 IU/L (ref 44–121)
BUN/Creatinine Ratio: 15 (ref 12–28)
BUN: 17 mg/dL (ref 8–27)
Bilirubin Total: <0.2 mg/dL (ref 0.0–1.2)
CO2: 22 mmol/L (ref 20–29)
Calcium: 9.2 mg/dL (ref 8.7–10.3)
Chloride: 101 mmol/L (ref 96–106)
Creatinine, Ser: 1.16 mg/dL — AB (ref 0.57–1.00)
Globulin, Total: 2.9 g/dL (ref 1.5–4.5)
Glucose: 133 mg/dL — AB (ref 70–99)
Potassium: 4.7 mmol/L (ref 3.5–5.2)
Sodium: 137 mmol/L (ref 134–144)
Total Protein: 6.8 g/dL (ref 6.0–8.5)
eGFR: 51 mL/min/1.73 — AB (ref >59)

## 2024-07-07 LAB — CBC WITH DIFFERENTIAL/PLATELET
Basophils Absolute: 0.1 x10E3/uL (ref 0.0–0.2)
Basos: 2 %
EOS (ABSOLUTE): 0.5 x10E3/uL — ABNORMAL HIGH (ref 0.0–0.4)
Eos: 9 %
Hematocrit: 31.3 % — ABNORMAL LOW (ref 34.0–46.6)
Hemoglobin: 10 g/dL — ABNORMAL LOW (ref 11.1–15.9)
Immature Grans (Abs): 0 x10E3/uL (ref 0.0–0.1)
Immature Granulocytes: 0 %
Lymphocytes Absolute: 1.8 x10E3/uL (ref 0.7–3.1)
Lymphs: 31 %
MCH: 33.2 pg — ABNORMAL HIGH (ref 26.6–33.0)
MCHC: 31.9 g/dL (ref 31.5–35.7)
MCV: 104 fL — ABNORMAL HIGH (ref 79–97)
Monocytes Absolute: 0.6 x10E3/uL (ref 0.1–0.9)
Monocytes: 11 %
Neutrophils Absolute: 2.8 x10E3/uL (ref 1.4–7.0)
Neutrophils: 47 %
Platelets: 258 x10E3/uL (ref 150–450)
RBC: 3.01 x10E6/uL — ABNORMAL LOW (ref 3.77–5.28)
RDW: 15 % (ref 11.7–15.4)
WBC: 5.9 x10E3/uL (ref 3.4–10.8)

## 2024-07-07 LAB — VITAMIN B12: Vitamin B-12: 543 pg/mL (ref 232–1245)

## 2024-07-07 LAB — LIPID PANEL
Cholesterol, Total: 130 mg/dL (ref 100–199)
HDL: 70 mg/dL (ref >39)
LDL CALC COMMENT:: 1.9 ratio (ref 0.0–4.4)
LDL Chol Calc (NIH): 41 mg/dL (ref 0–99)
Triglycerides: 106 mg/dL (ref 0–149)
VLDL Cholesterol Cal: 19 mg/dL (ref 5–40)

## 2024-07-07 LAB — MICROALBUMIN / CREATININE URINE RATIO
Creatinine, Urine: 114.8 mg/dL
Microalb/Creat Ratio: 32 mg/g{creat} — ABNORMAL HIGH (ref 0–29)
Microalbumin, Urine: 37.3 ug/mL

## 2024-07-08 DIAGNOSIS — G894 Chronic pain syndrome: Secondary | ICD-10-CM | POA: Diagnosis not present

## 2024-07-08 DIAGNOSIS — M62838 Other muscle spasm: Secondary | ICD-10-CM | POA: Diagnosis not present

## 2024-07-11 ENCOUNTER — Ambulatory Visit: Payer: Self-pay | Admitting: Family Medicine

## 2024-07-13 LAB — IRON AND TIBC
Iron Saturation: 14 % — AB (ref 15–55)
Iron: 57 ug/dL (ref 27–139)
Total Iron Binding Capacity: 397 ug/dL (ref 250–450)
UIBC: 340 ug/dL (ref 118–369)

## 2024-07-13 LAB — FOLATE: Folate: 12.5 ng/mL (ref >3.0)

## 2024-07-13 LAB — SPECIMEN STATUS REPORT

## 2024-07-13 LAB — FERRITIN: Ferritin: 30 ng/mL (ref 15–150)

## 2024-07-17 ENCOUNTER — Other Ambulatory Visit: Payer: Self-pay | Admitting: Family Medicine

## 2024-07-17 DIAGNOSIS — F39 Unspecified mood [affective] disorder: Secondary | ICD-10-CM

## 2024-07-22 DIAGNOSIS — G8929 Other chronic pain: Secondary | ICD-10-CM | POA: Diagnosis not present

## 2024-07-22 DIAGNOSIS — E119 Type 2 diabetes mellitus without complications: Secondary | ICD-10-CM | POA: Diagnosis not present

## 2024-07-22 DIAGNOSIS — M545 Low back pain, unspecified: Secondary | ICD-10-CM | POA: Diagnosis not present

## 2024-07-22 DIAGNOSIS — Z79899 Other long term (current) drug therapy: Secondary | ICD-10-CM | POA: Diagnosis not present

## 2024-07-22 DIAGNOSIS — I10 Essential (primary) hypertension: Secondary | ICD-10-CM | POA: Diagnosis not present

## 2024-07-27 ENCOUNTER — Telehealth: Payer: Self-pay | Admitting: Family Medicine

## 2024-07-27 NOTE — Telephone Encounter (Signed)
 Pt aware to call for the lab order.  Copied from CRM #[id number]. Topic: General - Other >> Jul 27, 2024 10:55 AM Willma R wrote: Reason for CRM: Milo - [facility] calling to verify if the fax they sent over on 09/29 and today 10/01 were received. States order for GI testing lab was sent on a [facility] [employer] order form. Once reviewed fax needs to signed,dated, and faxed back to him.   Milo can be reached at [phone]

## 2024-08-01 ENCOUNTER — Other Ambulatory Visit

## 2024-08-02 NOTE — Telephone Encounter (Signed)
 Talked w/ pt, informed her this is an outside lab that performs this test we do not. It is not a test that our providers requested and usually do not sign forms for. Pt agrees that she does not want it done. Will write on fax 'Not authorized, pt does not want' and fax form back to [facility] at [phone]

## 2024-08-07 DIAGNOSIS — G894 Chronic pain syndrome: Secondary | ICD-10-CM | POA: Diagnosis not present

## 2024-08-07 DIAGNOSIS — M62838 Other muscle spasm: Secondary | ICD-10-CM | POA: Diagnosis not present

## 2024-08-08 ENCOUNTER — Other Ambulatory Visit: Payer: Self-pay | Admitting: Family Medicine

## 2024-08-08 DIAGNOSIS — E118 Type 2 diabetes mellitus with unspecified complications: Secondary | ICD-10-CM

## 2024-08-11 DIAGNOSIS — C773 Secondary and unspecified malignant neoplasm of axilla and upper limb lymph nodes: Secondary | ICD-10-CM | POA: Diagnosis not present

## 2024-08-11 DIAGNOSIS — K5730 Diverticulosis of large intestine without perforation or abscess without bleeding: Secondary | ICD-10-CM | POA: Diagnosis not present

## 2024-08-11 DIAGNOSIS — C50912 Malignant neoplasm of unspecified site of left female breast: Secondary | ICD-10-CM | POA: Diagnosis not present

## 2024-08-11 DIAGNOSIS — C7951 Secondary malignant neoplasm of bone: Secondary | ICD-10-CM | POA: Diagnosis not present

## 2024-08-17 DIAGNOSIS — Z131 Encounter for screening for diabetes mellitus: Secondary | ICD-10-CM | POA: Diagnosis not present

## 2024-08-17 DIAGNOSIS — I10 Essential (primary) hypertension: Secondary | ICD-10-CM | POA: Diagnosis not present

## 2024-08-22 DIAGNOSIS — G8929 Other chronic pain: Secondary | ICD-10-CM | POA: Diagnosis not present

## 2024-08-22 DIAGNOSIS — E119 Type 2 diabetes mellitus without complications: Secondary | ICD-10-CM | POA: Diagnosis not present

## 2024-08-22 DIAGNOSIS — I10 Essential (primary) hypertension: Secondary | ICD-10-CM | POA: Diagnosis not present

## 2024-08-22 DIAGNOSIS — C7951 Secondary malignant neoplasm of bone: Secondary | ICD-10-CM | POA: Diagnosis not present

## 2024-08-22 DIAGNOSIS — M545 Low back pain, unspecified: Secondary | ICD-10-CM | POA: Diagnosis not present

## 2024-09-15 ENCOUNTER — Other Ambulatory Visit: Payer: Self-pay | Admitting: Family Medicine

## 2024-09-15 DIAGNOSIS — I4811 Longstanding persistent atrial fibrillation: Secondary | ICD-10-CM

## 2024-10-06 ENCOUNTER — Ambulatory Visit: Payer: Self-pay | Admitting: Family Medicine

## 2024-10-06 ENCOUNTER — Encounter: Payer: Self-pay | Admitting: Family Medicine

## 2024-10-06 VITALS — BP 119/53 | HR 83 | Temp 97.7°F | Ht 63.0 in | Wt 247.0 lb

## 2024-10-06 DIAGNOSIS — E119 Type 2 diabetes mellitus without complications: Secondary | ICD-10-CM

## 2024-10-06 DIAGNOSIS — I4811 Longstanding persistent atrial fibrillation: Secondary | ICD-10-CM

## 2024-10-06 DIAGNOSIS — I10 Essential (primary) hypertension: Secondary | ICD-10-CM

## 2024-10-06 DIAGNOSIS — F39 Unspecified mood [affective] disorder: Secondary | ICD-10-CM

## 2024-10-06 DIAGNOSIS — C7951 Secondary malignant neoplasm of bone: Secondary | ICD-10-CM

## 2024-10-06 DIAGNOSIS — E118 Type 2 diabetes mellitus with unspecified complications: Secondary | ICD-10-CM

## 2024-10-06 DIAGNOSIS — G4700 Insomnia, unspecified: Secondary | ICD-10-CM

## 2024-10-06 DIAGNOSIS — E782 Mixed hyperlipidemia: Secondary | ICD-10-CM

## 2024-10-06 LAB — BAYER DCA HB A1C WAIVED: HB A1C (BAYER DCA - WAIVED): 8.1 % — ABNORMAL HIGH (ref 4.8–5.6)

## 2024-10-06 MED ORDER — ROPINIROLE HCL 1 MG PO TABS: 1.0000 mg | ORAL_TABLET | Freq: Every day | ORAL | 0 refills | Status: AC

## 2024-10-06 MED ORDER — BUSPIRONE HCL 10 MG PO TABS: 10.0000 mg | ORAL_TABLET | Freq: Three times a day (TID) | ORAL | 3 refills | Status: AC | PRN

## 2024-10-06 MED ORDER — DULOXETINE HCL 60 MG PO CPEP: ORAL_CAPSULE | ORAL | 0 refills | Status: AC

## 2024-10-06 MED ORDER — ACCU-CHEK AVIVA PLUS W/DEVICE KIT: PACK | 0 refills | Status: AC

## 2024-10-06 MED ORDER — METFORMIN HCL ER 750 MG PO TB24: ORAL_TABLET | ORAL | 0 refills | Status: AC

## 2024-10-06 MED ORDER — ARIPIPRAZOLE 5 MG PO TABS: 5.0000 mg | ORAL_TABLET | Freq: Every day | ORAL | 1 refills | Status: AC

## 2024-10-06 NOTE — Progress Notes (Signed)
 Subjective:  Patient ID: Diana Mathews, female    DOB: 1951/05/18  Age: 73 y.o. MRN: [id number]  CC: Medical Management of Chronic Issues and Fall (On Monday left knee is swollen and bruised. Doesn't hurt. No numbness.)   HPI  Discussed the use of AI scribe software for clinical note transcription with the patient, who gave verbal consent to proceed.  History of Present Illness Diana Mathews is a 73 year old female who presents for a routine checkup.  She is currently on Lasix 40 mg once daily for swelling, which has improved. She has not been prescribed potassium supplements yet. She continues to take Eliquis  and Abilify  regularly. Duloxetine  helps manage her mood and anxiety. Her blood sugar was 116 mg/dL this morning, with a recent high of 200 mg/dL, the highest in a month. She is also taking atorvastatin  for cholesterol management.  She is on gabapentin  for nerve pain and hydrocodone for pain management, taking four tablets a day. Promethazine  is used for nausea, particularly after her Mounjaro  injection, which causes nausea for two to three days post-injection. She reports difficulty losing weight despite being on Mounjaro , attributing it to dietary habits and lack of exercise.  Her breast cancer is shrinking but still present, with a follow-up appointment scheduled for December 26. She has a lesion on her tailbone and does not know if it is related to her breast cancer.  She recently fell and bruised her knee but reports no pain or difficulty walking, although she uses a cane and walker at home. No heart palpitations or changes in her breast cancer symptoms. She is preparing for Christmas and mentions her birthday is on Christmas Day, when she will turn 48.          10/06/2024    2:49 PM 07/06/2024    3:06 PM 07/04/2024    1:51 PM  Depression screen PHQ 2/9  Decreased Interest 1 0 0  Down, Depressed, Hopeless 1 1 1   PHQ - 2 Score 2 1 1   Altered sleeping 1 0 0  Tired,  decreased energy 1 1 0  Change in appetite 1 1 1   Feeling bad or failure about yourself  0 1 0  Trouble concentrating 0 0 0  Moving slowly or fidgety/restless 0 0 0  Suicidal thoughts 0 0 0  PHQ-9 Score 5 4  2    Difficult doing work/chores Somewhat difficult Somewhat difficult Not difficult at all     Data saved with a previous flowsheet row definition    History Tomie has a past medical history of Atrial fibrillation (HCC) (06/12/2018), Depression, Essential hypertension, Headache, Hyperlipidemia, Metastatic breast cancer, Osteoarthritis, Sleep apnea, TIA (transient ischemic attack), and Type 2 diabetes mellitus (HCC).   She has a past surgical history that includes Abdominal hysterectomy; Wrist surgery (Bilateral); Hernia repair; Cholecystectomy; Tubal ligation; and Foot surgery (Left).   Her family history includes Anxiety disorder in her sister; Arthritis in her mother, sister, and sister; COPD in her mother; Depression in her sister; Diabetes in her brother, brother, mother, sister, and sister; Hypertension in her mother; Mental illness in her brother; Obesity in her brother; Stroke in her mother.She reports that she quit smoking about 39 years ago. Her smoking use included cigarettes. She has never used smokeless tobacco. She reports that she does not drink alcohol  and does not use drugs.    ROS Review of Systems  Constitutional: Negative.   HENT:  Negative for congestion.   Eyes:  Negative for visual  disturbance.  Respiratory:  Negative for shortness of breath.   Cardiovascular:  Negative for chest pain.  Gastrointestinal:  Negative for abdominal pain, constipation, diarrhea, nausea and vomiting.  Genitourinary:  Negative for difficulty urinating.  Musculoskeletal:  Negative for arthralgias and myalgias.  Neurological:  Negative for headaches.  Psychiatric/Behavioral:  Negative for sleep disturbance.     Objective:  BP (!) 119/53   Pulse 83   Temp 97.7 F (36.5 C)   Ht  5' 3 (1.6 m)   Wt 247 lb (112 kg)   SpO2 93%   BMI 43.75 kg/m   BP Readings from Last 3 Encounters:  10/06/24 (!) 119/53  07/06/24 130/68  02/24/24 114/60    Wt Readings from Last 3 Encounters:  10/06/24 247 lb (112 kg)  07/06/24 245 lb (111.1 kg)  07/04/24 204 lb (92.5 kg)     Physical Exam Skin:    Findings: Bruising (at left knee (after fall two  days ago)) present.    Physical Exam GENERAL: Alert, cooperative, well developed, no acute distress HEENT: Normocephalic, normal oropharynx, moist mucous membranes CHEST: Clear to auscultation bilaterally, no wheezes, rhonchi, or crackles CARDIOVASCULAR: Normal heart rate and rhythm, S1 and S2 normal without murmurs ABDOMEN: Soft, non-tender, non-distended, without organomegaly, normal bowel sounds EXTREMITIES: No cyanosis or edema MUSCULOSKELETAL: Large bruise on knee, able to ambulate without limping NEUROLOGICAL: Cranial nerves grossly intact, moves all extremities without gross motor or sensory deficit   Assessment & Plan:  Essential hypertension  Mood disorder -     ARIPiprazole ; Take 1 tablet (5 mg total) by mouth daily.  Dispense: 90 tablet; Refill: 1  Controlled type 2 diabetes mellitus with complication, without long-term current use of insulin  (HCC) -     metFORMIN  HCl ER; TAKE ONE (1) TABLET BY MOUTH TWO (2) TIMES DAILY  Dispense: 180 tablet; Refill: 0  Morbid obesity (HCC)  Diabetes mellitus treated with injections of non-insulin  medication (HCC)  Longstanding persistent atrial fibrillation (HCC)  Mixed hyperlipidemia  Insomnia, unspecified type  Malignant neoplasm metastatic to bone (HCC)  Other orders -     DULoxetine  HCl; TAKE 1 CAPSULE BY MOUTH EVERY MORNING & AT BEDTIME  Dispense: 180 capsule; Refill: 0 -     rOPINIRole  HCl; Take 1 tablet (1 mg total) by mouth at bedtime.  Dispense: 90 tablet; Refill: 0 -     busPIRone  HCl; Take 1 tablet (10 mg total) by mouth 3 (three) times daily as needed.   Dispense: 270 tablet; Refill: 3 -     Accu-Chek Aviva Plus; Use to check glucose before breakfast and 2 hours after supper  Dispense: 1 kit; Refill: 0    Assessment and Plan Assessment & Plan Type 2 diabetes mellitus   Blood glucose levels are generally well-controlled, with a recent high of 200 mg/dL, the highest in a month. Current medications are effective. Ordered diabetes blood work, including A1c, and sent a prescription for Accu-Check Aviva strips to the pharmacy. Encouraged dietary examination and potential referral to a dietitian if weight loss is not achieved.  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 Follow-up with the oncologist is scheduled for December 26th.  Morbid obesity   Weight has increased slightly since September. Mounjaro  is being used for weight loss, but dietary habits and exercise are contributing factors. The goal is to reduce weight to under 200 pounds within a year to a year and  a half, eventually aiming for 150 pounds. Encouraged dietary examination and potential referral to a dietitian if weight loss is not achieved.  Longstanding persistent atrial fibrillation   No recent episodes of palpitations or irregular heartbeats. Continue Eliquis  for anticoagulation.  Mixed hyperlipidemia   Cholesterol levels are being monitored. Continue atorvastatin  and ordered cholesterol blood work.  Mood disorder   Mood, anxiety, and depression symptoms are well-controlled with Abilify  and duloxetine . Refilled Abilify  and duloxetine  for six months.  Chronic pain of bilateral lower legs   Chronic pain is managed with hydrocodone prescribed by a pain specialist. A recent fall resulted in a bruise but no significant pain or mobility issues. Continue hydrocodone as prescribed.  Insomnia   Reports of nocturia but no significant sleep disturbances. Able to return to sleep  quickly after waking.  General health maintenance   Routine health maintenance discussed, including medication refills and lab work. Ordered diabetes and cholesterol blood work and scheduled a follow-up appointment in early to mid-March.       Follow-up: Return in about 3 months (around 01/04/2025).  Butler Der, M.D.

## 2024-10-07 LAB — CBC WITH DIFFERENTIAL/PLATELET
Basophils Absolute: 0.1 x10E3/uL (ref 0.0–0.2)
Basos: 2 %
EOS (ABSOLUTE): 0.1 x10E3/uL (ref 0.0–0.4)
Eos: 2 %
Hematocrit: 31.6 % — ABNORMAL LOW (ref 34.0–46.6)
Hemoglobin: 10.1 g/dL — ABNORMAL LOW (ref 11.1–15.9)
Immature Grans (Abs): 0 x10E3/uL (ref 0.0–0.1)
Immature Granulocytes: 0 %
Lymphocytes Absolute: 2 x10E3/uL (ref 0.7–3.1)
Lymphs: 43 %
MCH: 33 pg (ref 26.6–33.0)
MCHC: 32 g/dL (ref 31.5–35.7)
MCV: 103 fL — ABNORMAL HIGH (ref 79–97)
Monocytes Absolute: 0.3 x10E3/uL (ref 0.1–0.9)
Monocytes: 6 %
Neutrophils Absolute: 2.2 x10E3/uL (ref 1.4–7.0)
Neutrophils: 47 %
Platelets: 340 x10E3/uL (ref 150–450)
RBC: 3.06 x10E6/uL — ABNORMAL LOW (ref 3.77–5.28)
RDW: 17.1 % — ABNORMAL HIGH (ref 11.7–15.4)
WBC: 4.7 x10E3/uL (ref 3.4–10.8)

## 2024-10-07 LAB — LIPID PANEL
Chol/HDL Ratio: 2 ratio (ref 0.0–4.4)
Cholesterol, Total: 143 mg/dL (ref 100–199)
HDL: 72 mg/dL (ref >39)
LDL Chol Calc (NIH): 52 mg/dL (ref 0–99)
Triglycerides: 104 mg/dL (ref 0–149)
VLDL Cholesterol Cal: 19 mg/dL (ref 5–40)

## 2024-10-07 LAB — CMP14+EGFR
ALT: 28 IU/L (ref 0–32)
AST: 31 IU/L (ref 0–40)
Albumin: 4.2 g/dL (ref 3.9–4.9)
Alkaline Phosphatase: 105 IU/L (ref 49–135)
BUN/Creatinine Ratio: 14 (ref 12–28)
BUN: 34 mg/dL — ABNORMAL HIGH (ref 8–27)
Bilirubin Total: 0.3 mg/dL (ref 0.0–1.2)
CO2: 19 mmol/L — ABNORMAL LOW (ref 20–29)
Calcium: 8.5 mg/dL — ABNORMAL LOW (ref 8.7–10.3)
Chloride: 91 mmol/L — ABNORMAL LOW (ref 96–106)
Creatinine, Ser: 2.41 mg/dL — ABNORMAL HIGH (ref 0.57–1.00)
Globulin, Total: 3.2 g/dL (ref 1.5–4.5)
Glucose: 248 mg/dL — ABNORMAL HIGH (ref 70–99)
Potassium: 4.7 mmol/L (ref 3.5–5.2)
Sodium: 128 mmol/L — ABNORMAL LOW (ref 134–144)
Total Protein: 7.4 g/dL (ref 6.0–8.5)
eGFR: 21 mL/min/1.73 — ABNORMAL LOW (ref >59)

## 2024-10-10 ENCOUNTER — Ambulatory Visit: Payer: Self-pay | Admitting: Family Medicine

## 2024-10-13 ENCOUNTER — Other Ambulatory Visit: Payer: Self-pay

## 2024-10-13 DIAGNOSIS — R748 Abnormal levels of other serum enzymes: Secondary | ICD-10-CM

## 2024-10-13 DIAGNOSIS — E119 Type 2 diabetes mellitus without complications: Secondary | ICD-10-CM

## 2024-10-14 ENCOUNTER — Other Ambulatory Visit: Payer: Self-pay | Admitting: Family Medicine

## 2024-10-17 ENCOUNTER — Other Ambulatory Visit

## 2024-10-17 DIAGNOSIS — E119 Type 2 diabetes mellitus without complications: Secondary | ICD-10-CM

## 2024-10-17 DIAGNOSIS — R748 Abnormal levels of other serum enzymes: Secondary | ICD-10-CM

## 2024-10-17 LAB — BASIC METABOLIC PANEL WITH GFR
BUN/Creatinine Ratio: 14 (ref 12–28)
BUN: 26 mg/dL (ref 8–27)
CO2: 15 mmol/L — ABNORMAL LOW (ref 20–29)
Calcium: 10.4 mg/dL — ABNORMAL HIGH (ref 8.7–10.3)
Chloride: 101 mmol/L (ref 96–106)
Creatinine, Ser: 1.84 mg/dL — ABNORMAL HIGH (ref 0.57–1.00)
Glucose: 201 mg/dL — ABNORMAL HIGH (ref 70–99)
Sodium: 136 mmol/L (ref 134–144)
eGFR: 29 mL/min/1.73 — ABNORMAL LOW

## 2024-10-18 ENCOUNTER — Ambulatory Visit: Payer: Self-pay | Admitting: Family Medicine

## 2024-10-18 NOTE — Progress Notes (Signed)
Hello Lyrica,  Your lab result is normal and/or stable.Some minor variations that are not significant are commonly marked abnormal, but do not represent any medical problem for you.  Best regards, Claretta Fraise, M.D.

## 2024-11-01 ENCOUNTER — Emergency Department (HOSPITAL_COMMUNITY)

## 2024-11-01 ENCOUNTER — Inpatient Hospital Stay (HOSPITAL_COMMUNITY)
Admission: EM | Admit: 2024-11-01 | Discharge: 2024-11-03 | DRG: 683 | Disposition: A | Discharge status: Home Health | Attending: Internal Medicine | Admitting: Internal Medicine

## 2024-11-01 ENCOUNTER — Encounter (HOSPITAL_COMMUNITY): Payer: Self-pay | Admitting: *Deleted

## 2024-11-01 ENCOUNTER — Other Ambulatory Visit: Payer: Self-pay

## 2024-11-01 DIAGNOSIS — Z8673 Personal history of transient ischemic attack (TIA), and cerebral infarction without residual deficits: Secondary | ICD-10-CM

## 2024-11-01 DIAGNOSIS — E1122 Type 2 diabetes mellitus with diabetic chronic kidney disease: Secondary | ICD-10-CM | POA: Diagnosis present

## 2024-11-01 DIAGNOSIS — Z833 Family history of diabetes mellitus: Secondary | ICD-10-CM

## 2024-11-01 DIAGNOSIS — I5032 Chronic diastolic (congestive) heart failure: Secondary | ICD-10-CM | POA: Diagnosis present

## 2024-11-01 DIAGNOSIS — L89892 Pressure ulcer of other site, stage 2: Secondary | ICD-10-CM | POA: Diagnosis present

## 2024-11-01 DIAGNOSIS — Z823 Family history of stroke: Secondary | ICD-10-CM

## 2024-11-01 DIAGNOSIS — Z885 Allergy status to narcotic agent status: Secondary | ICD-10-CM

## 2024-11-01 DIAGNOSIS — Z825 Family history of asthma and other chronic lower respiratory diseases: Secondary | ICD-10-CM

## 2024-11-01 DIAGNOSIS — Z8249 Family history of ischemic heart disease and other diseases of the circulatory system: Secondary | ICD-10-CM

## 2024-11-01 DIAGNOSIS — I480 Paroxysmal atrial fibrillation: Secondary | ICD-10-CM | POA: Diagnosis present

## 2024-11-01 DIAGNOSIS — Z87891 Personal history of nicotine dependence: Secondary | ICD-10-CM

## 2024-11-01 DIAGNOSIS — E785 Hyperlipidemia, unspecified: Secondary | ICD-10-CM | POA: Diagnosis present

## 2024-11-01 DIAGNOSIS — F39 Unspecified mood [affective] disorder: Secondary | ICD-10-CM | POA: Diagnosis present

## 2024-11-01 DIAGNOSIS — E872 Acidosis, unspecified: Secondary | ICD-10-CM | POA: Diagnosis present

## 2024-11-01 DIAGNOSIS — Z79811 Long term (current) use of aromatase inhibitors: Secondary | ICD-10-CM

## 2024-11-01 DIAGNOSIS — D638 Anemia in other chronic diseases classified elsewhere: Secondary | ICD-10-CM | POA: Diagnosis present

## 2024-11-01 DIAGNOSIS — Z79899 Other long term (current) drug therapy: Secondary | ICD-10-CM

## 2024-11-01 DIAGNOSIS — E66813 Obesity, class 3: Secondary | ICD-10-CM | POA: Diagnosis present

## 2024-11-01 DIAGNOSIS — Z7985 Long-term (current) use of injectable non-insulin antidiabetic drugs: Secondary | ICD-10-CM

## 2024-11-01 DIAGNOSIS — Z7984 Long term (current) use of oral hypoglycemic drugs: Secondary | ICD-10-CM

## 2024-11-01 DIAGNOSIS — N179 Acute kidney failure, unspecified: Principal | ICD-10-CM | POA: Diagnosis present

## 2024-11-01 DIAGNOSIS — E875 Hyperkalemia: Secondary | ICD-10-CM | POA: Diagnosis present

## 2024-11-01 DIAGNOSIS — I130 Hypertensive heart and chronic kidney disease with heart failure and stage 1 through stage 4 chronic kidney disease, or unspecified chronic kidney disease: Secondary | ICD-10-CM | POA: Diagnosis present

## 2024-11-01 DIAGNOSIS — Z6841 Body Mass Index (BMI) 40.0 and over, adult: Secondary | ICD-10-CM

## 2024-11-01 DIAGNOSIS — E860 Dehydration: Secondary | ICD-10-CM | POA: Diagnosis present

## 2024-11-01 DIAGNOSIS — R748 Abnormal levels of other serum enzymes: Secondary | ICD-10-CM | POA: Diagnosis present

## 2024-11-01 DIAGNOSIS — Z8261 Family history of arthritis: Secondary | ICD-10-CM

## 2024-11-01 DIAGNOSIS — E1165 Type 2 diabetes mellitus with hyperglycemia: Secondary | ICD-10-CM | POA: Diagnosis present

## 2024-11-01 DIAGNOSIS — E1142 Type 2 diabetes mellitus with diabetic polyneuropathy: Secondary | ICD-10-CM | POA: Diagnosis present

## 2024-11-01 DIAGNOSIS — Z7901 Long term (current) use of anticoagulants: Secondary | ICD-10-CM

## 2024-11-01 DIAGNOSIS — N184 Chronic kidney disease, stage 4 (severe): Secondary | ICD-10-CM | POA: Diagnosis present

## 2024-11-01 DIAGNOSIS — C7951 Secondary malignant neoplasm of bone: Secondary | ICD-10-CM | POA: Diagnosis present

## 2024-11-01 LAB — URINALYSIS, ROUTINE W REFLEX MICROSCOPIC
Bilirubin Urine: NEGATIVE
Glucose, UA: NEGATIVE mg/dL
Ketones, ur: NEGATIVE mg/dL
Nitrite: POSITIVE — AB
Protein, ur: NEGATIVE mg/dL
Specific Gravity, Urine: 1.011 (ref 1.005–1.030)
pH: 5 (ref 5.0–8.0)

## 2024-11-01 LAB — CBC WITH DIFFERENTIAL/PLATELET
Basophils Absolute: 0 K/uL (ref 0.0–0.1)
Basophils Relative: 0 %
Eosinophils Absolute: 0.3 K/uL (ref 0.0–0.5)
Eosinophils Relative: 5 %
HCT: 32.9 % — ABNORMAL LOW (ref 36.0–46.0)
Hemoglobin: 11.1 g/dL — ABNORMAL LOW (ref 12.0–15.0)
Lymphocytes Relative: 44 %
Lymphs Abs: 3 K/uL (ref 0.7–4.0)
MCH: 33.7 pg (ref 26.0–34.0)
MCHC: 33.7 g/dL (ref 30.0–36.0)
MCV: 100 fL (ref 80.0–100.0)
Monocytes Absolute: 0.3 K/uL (ref 0.1–1.0)
Monocytes Relative: 5 %
Neutro Abs: 3.2 K/uL (ref 1.7–7.7)
Neutrophils Relative %: 46 %
Platelets: 358 K/uL (ref 150–400)
RBC: 3.29 MIL/uL — ABNORMAL LOW (ref 3.87–5.11)
RDW: 16.6 % — ABNORMAL HIGH (ref 11.5–15.5)
Smear Review: NORMAL
WBC: 6.9 K/uL (ref 4.0–10.5)
nRBC: 0 % (ref 0.0–0.2)

## 2024-11-01 LAB — BASIC METABOLIC PANEL WITH GFR
Anion gap: 19 — ABNORMAL HIGH (ref 5–15)
BUN: 34 mg/dL — ABNORMAL HIGH (ref 8–23)
CO2: 17 mmol/L — ABNORMAL LOW (ref 22–32)
Calcium: 10.6 mg/dL — ABNORMAL HIGH (ref 8.9–10.3)
Chloride: 103 mmol/L (ref 98–111)
Creatinine, Ser: 3.06 mg/dL — ABNORMAL HIGH (ref 0.44–1.00)
GFR, Estimated: 16 mL/min — ABNORMAL LOW
Glucose, Bld: 152 mg/dL — ABNORMAL HIGH (ref 70–99)
Potassium: 4.7 mmol/L (ref 3.5–5.1)
Sodium: 140 mmol/L (ref 135–145)

## 2024-11-01 LAB — CK: Total CK: 1380 U/L — ABNORMAL HIGH (ref 38–234)

## 2024-11-01 LAB — CBG MONITORING, ED: Glucose-Capillary: 164 mg/dL — ABNORMAL HIGH (ref 70–99)

## 2024-11-01 MED ORDER — APIXABAN 5 MG PO TABS
5.0000 mg | ORAL_TABLET | Freq: Two times a day (BID) | ORAL | Status: DC
  Administered 2024-11-02 – 2024-11-03 (×4): 5 mg via ORAL
  Filled 2024-11-01 (×5): qty 1

## 2024-11-01 MED ORDER — ACETAMINOPHEN 500 MG PO TABS: 500.0000 mg | ORAL_TABLET | Freq: Four times a day (QID) | ORAL | Status: DC | PRN

## 2024-11-01 MED ORDER — INSULIN ASPART 100 UNIT/ML IJ SOLN
0.0000 [IU] | Freq: Three times a day (TID) | INTRAMUSCULAR | Status: DC
  Administered 2024-11-02: 2 [IU] via SUBCUTANEOUS
  Administered 2024-11-02: 1 [IU] via SUBCUTANEOUS
  Administered 2024-11-03: 2 [IU] via SUBCUTANEOUS
  Filled 2024-11-01 (×3): qty 1

## 2024-11-01 MED ORDER — PROCHLORPERAZINE EDISYLATE 10 MG/2ML IJ SOLN: 5.0000 mg | Freq: Four times a day (QID) | INTRAMUSCULAR | Status: DC | PRN

## 2024-11-01 MED ORDER — OXYCODONE HCL 5 MG PO TABS
5.0000 mg | ORAL_TABLET | ORAL | Status: DC | PRN
  Administered 2024-11-02 – 2024-11-03 (×4): 10 mg via ORAL
  Filled 2024-11-01 (×4): qty 2

## 2024-11-01 MED ORDER — SODIUM CHLORIDE 0.9 % IV BOLUS
1000.0000 mL | Freq: Once | INTRAVENOUS | Status: AC
  Administered 2024-11-01: 1000 mL via INTRAVENOUS

## 2024-11-01 MED ORDER — INSULIN ASPART 100 UNIT/ML IJ SOLN: 0.0000 [IU] | Freq: Every day | INTRAMUSCULAR | Status: DC

## 2024-11-01 MED ORDER — POLYETHYLENE GLYCOL 3350 17 G PO PACK: 17.0000 g | PACK | Freq: Every day | ORAL | Status: DC | PRN

## 2024-11-01 MED ORDER — MELATONIN 3 MG PO TABS
6.0000 mg | ORAL_TABLET | Freq: Every evening | ORAL | Status: DC | PRN
  Administered 2024-11-02 (×2): 6 mg via ORAL
  Filled 2024-11-01 (×2): qty 2

## 2024-11-01 MED ORDER — LIDOCAINE 5 % EX PTCH
2.0000 | MEDICATED_PATCH | Freq: Once | CUTANEOUS | Status: AC
  Administered 2024-11-02: 2 via TRANSDERMAL
  Filled 2024-11-01: qty 2

## 2024-11-01 NOTE — ED Triage Notes (Signed)
 Pt BIB [facility] for fall today due to generalized weakness. Pt currently taking weight loss injections and had one yesterday. Pt has decrease appetite and not eating due to the injections.  Pt with left hip wound.  Pt is on Eliquis  and pt states she did not hit her head. Last ate last night.

## 2024-11-01 NOTE — Plan of Care (Signed)
   Problem: Education: Goal: Knowledge of General Education information will improve Description: Including pain rating scale, medication(s)/side effects and non-pharmacologic comfort measures Outcome: Progressing   Problem: Activity: Goal: Risk for activity intolerance will decrease Outcome: Progressing   Problem: Nutrition: Goal: Adequate nutrition will be maintained Outcome: Progressing

## 2024-11-01 NOTE — ED Provider Notes (Signed)
 " [facility] AT [facility] Provider Note   CSN: [id number] Arrival date & time: 11/01/24  1227     Patient presents with: [place]   Diana Mathews is a 74 y.o. female with a history including hypertension, hyperlipidemia, atrial fibrillation, type 2 diabetes, and breast cancer with metastatic disease to bones presenting for evaluation of increased generalized weakness and a fall which occurred this morning.  Her son called her this morning around 5 AM and they spoke, he states she was somewhat confused on the telephone, then around 11 AM patient's brother found her in her bathroom sitting on the floor propped up against her tub.  She states she was trying get off the toilet when she lost her balance and fell, she denies any injuries with the fall including head injury.  She is on Eliquis  due to her A-fib.  She does report decreased generalized appetite since starting Mounjaro  for weight loss.  She is also actively undergoing chemotherapy for her cancer.  She denies fevers or chills, headache, cough, shortness of breath.   The history is provided by the patient.       Prior to Admission medications  Medication Sig Start Date End Date Taking? Authorizing Provider  apixaban  (ELIQUIS ) 5 MG TABS tablet Take 1 tablet (5 mg total) by mouth 2 (two) times daily. 02/24/24   Zollie Lowers, MD  ARIPiprazole  (ABILIFY ) 5 MG tablet Take 1 tablet (5 mg total) by mouth daily. 10/06/24   Zollie Lowers, MD  atorvastatin  (LIPITOR) 80 MG tablet TAKE ONE (1) TABLET BY MOUTH EVERY DAY 10/14/24   Zollie Lowers, MD  baclofen (LIORESAL) 10 MG tablet Take 10 mg by mouth 4 (four) times daily. 04/17/22   [provider]  Blood Glucose Monitoring Suppl (ACCU-CHEK AVIVA PLUS) w/Device KIT Use to check glucose before breakfast and 2 hours after supper 10/06/24   Zollie Lowers, MD  Blood Glucose Monitoring Suppl DEVI 1 each by Does not apply route in the morning, at noon, and at  bedtime. May substitute to any manufacturer covered by patient's insurance. 04/28/23   Zollie Lowers, MD  busPIRone  (BUSPAR ) 10 MG tablet Take 1 tablet (10 mg total) by mouth 3 (three) times daily as needed. 10/06/24   Zollie Lowers, MD  carvedilol  (COREG ) 6.25 MG tablet TAKE 1 TABLET BY MOUTH TWICE DAILY WITH FOOD 09/16/24   Zollie Lowers, MD  diltiazem  (CARDIZEM  CD) 240 MG 24 hr capsule TAKE ONE (1) CAPSULE BY MOUTH 2 TIMES DAILY 09/16/24   Zollie Lowers, MD  diphenhydrAMINE (BENADRYL) 25 mg capsule Take 25 mg by mouth at bedtime as needed.    [provider]  DULoxetine  (CYMBALTA ) 60 MG capsule TAKE 1 CAPSULE BY MOUTH EVERY MORNING & AT BEDTIME 10/06/24   Zollie Lowers, MD  furosemide (LASIX) 40 MG tablet Take 40 mg by mouth daily.    [provider]  gabapentin  (NEURONTIN ) 300 MG capsule Take 300 mg by mouth 3 (three) times daily. 05/19/24   [provider]  glucose blood (ONETOUCH VERIO) test strip CHECK MORNING, NOON, & BEDTIME Dx E11.8 04/18/24   Zollie Lowers, MD  HYDROcodone-acetaminophen  [facility]) 10-325 MG tablet Take 1 tablet by mouth every 6 (six) hours as needed.    [provider]  letrozole  (FEMARA ) 2.5 MG tablet Take 2.5 mg by mouth daily. 04/02/22   [provider]  metFORMIN  (GLUCOPHAGE -XR) 750 MG 24 hr tablet TAKE ONE (1) TABLET BY MOUTH TWO (2) TIMES DAILY 10/06/24  Zollie Lowers, MD  palbociclib [facility]) 125 MG tablet Take by mouth. 04/09/20   [provider]  promethazine  (PHENERGAN ) 25 MG tablet TAKE 1 TABLET BY MOUTH EVERY 6 HOURS AS NEEDED FOR NAUSEA & VOMITING 06/20/24   Zollie Lowers, MD  rOPINIRole  (REQUIP ) 1 MG tablet Take 1 tablet (1 mg total) by mouth at bedtime. 10/06/24   Zollie Lowers, MD  tirzepatide  (MOUNJARO ) 7.5 MG/0.5ML Pen Inject 7.5 mg into the skin once a week. 07/06/24   Zollie Lowers, MD  trazodone  (DESYREL ) 300 MG tablet TAKE 1/3 TO 1 TABLET AT BEDTIME AS NEEDED FOR SLEEP 09/18/24   Zollie Lowers, MD   TRUEplus Lancets 33G MISC Test BS QID Dx E11.8 08/20/21   Zollie Lowers, MD    Allergies: Codeine    Review of Systems  Constitutional:  Positive for fatigue. Negative for chills and fever.  HENT:  Negative for congestion and sore throat.   Eyes: Negative.   Respiratory:  Negative for chest tightness and shortness of breath.   Cardiovascular:  Negative for chest pain.  Gastrointestinal:  Negative for abdominal pain and nausea.  Genitourinary: Negative.   Musculoskeletal:  Negative for arthralgias, joint swelling and neck pain.  Skin: Negative.  Negative for rash and wound.  Neurological:  Positive for weakness. Negative for dizziness, light-headedness, numbness and headaches.  Psychiatric/Behavioral:  Positive for confusion.     Updated Vital Signs BP (!) 176/72 (BP Location: Right Arm)   Pulse 80   Temp 97.8 F (36.6 C) (Oral)   Resp 18   Ht 5' 3 (1.6 m)   Wt 107.5 kg   SpO2 95%   BMI 41.98 kg/m   Physical Exam Vitals and nursing note reviewed.  Constitutional:      General: She is not in acute distress.    Appearance: She is well-developed.     Comments: Drowsy but easily aroused  HENT:     Head: Normocephalic and atraumatic.     Mouth/Throat:     Mouth: Mucous membranes are moist.     Pharynx: No posterior oropharyngeal erythema.  Eyes:     Conjunctiva/sclera: Conjunctivae normal.  Cardiovascular:     Rate and Rhythm: Normal rate and regular rhythm.     Heart sounds: Normal heart sounds.  Pulmonary:     Effort: Pulmonary effort is normal.     Breath sounds: Normal breath sounds. No wheezing.  Abdominal:     General: Bowel sounds are normal.     Palpations: Abdomen is soft.     Tenderness: There is no abdominal tenderness. There is no guarding.  Musculoskeletal:        General: Normal range of motion.     Cervical back: Normal range of motion.  Skin:    General: Skin is warm and dry.     Findings: Lesion present. No erythema.     Comments: Superficial  appearing scabbed lesions right upper back.  Patchy denuded skin left lower abdominal wall  (pt states chronic).    Neurological:     Mental Status: She is oriented to person, place, and time and easily aroused.     Cranial Nerves: No cranial nerve deficit.     Sensory: No sensory deficit.     (all labs ordered are listed, but only abnormal results are displayed) Labs Reviewed  CBC WITH DIFFERENTIAL/PLATELET - Abnormal; Notable for the following components:      Result Value   RBC 3.29 (*)    Hemoglobin 11.1 (*)  HCT 32.9 (*)    RDW 16.6 (*)    All other components within normal limits  BASIC METABOLIC PANEL WITH GFR - Abnormal; Notable for the following components:   CO2 17 (*)    Glucose, Bld 152 (*)    BUN 34 (*)    Creatinine, Ser 3.06 (*)    Calcium  10.6 (*)    GFR, Estimated 16 (*)    Anion gap 19 (*)    All other components within normal limits  CK - Abnormal; Notable for the following components:   Total CK 1,380 (*)    All other components within normal limits  CBG MONITORING, ED - Abnormal; Notable for the following components:   Glucose-Capillary 164 (*)    All other components within normal limits  URINALYSIS, ROUTINE W REFLEX MICROSCOPIC    EKG: None  Radiology: CT Head Wo Contrast Result Date: 11/01/2024 CLINICAL DATA:  Generalized weakness. EXAM: CT HEAD WITHOUT CONTRAST TECHNIQUE: Contiguous axial images were obtained from the base of the skull through the vertex without intravenous contrast. RADIATION DOSE REDUCTION: This exam was performed according to the departmental dose-optimization program which includes automated exposure control, adjustment of the mA and/or kV according to patient size and/or use of iterative reconstruction technique. COMPARISON:  August 28, 2020 FINDINGS: Brain: No evidence of acute infarction, hemorrhage, hydrocephalus, extra-axial collection or mass lesion/mass effect. Vascular: No hyperdense vessel or unexpected calcification.  Skull: Normal. Negative for fracture or focal lesion. Sinuses/Orbits: No acute finding. Other: None. IMPRESSION: No acute intracranial pathology. Electronically Signed   By: Suzen Dials M.D.   On: 11/01/2024 18:47     Procedures   Medications Ordered in the ED  sodium chloride  0.9 % bolus 1,000 mL (1,000 mLs Intravenous New Bag/Given 11/01/24 2021)                                    Medical Decision Making Patient presenting with generalized weakness, reported confusion by son but patient is alert and oriented here although she does appear drowsy but appropriate, fell off the toilet this morning, denying significant injury but with her being on Eliquis  CT imaging was completed to rule out intracranial injury and is negative.  She does have significant dehydration her creatinine is 3.06 with a BUN of 34, her creatinine was 1.84  2 weeks ago.  Amount and/or Complexity of Data Reviewed Labs: ordered.    Details: Pertinent labs as mentioned above.  Additionally she has a total CK 1380.  She will probably need to repeat CK to make sure this does not continue to trend upward. Radiology: ordered.    Details: CT head negative for intracranial injury. Discussion of management or test interpretation with external provider(s): Call placed to the hospitalist for admission.  Discussed with Dr. Shona who accepts patient for admission  Risk Decision regarding hospitalization.        Final diagnoses:  AKI (acute kidney injury)  Dehydration  Elevated CK    ED Discharge Orders     None          Birdena Mliss RIGGERS 11/01/24 2057  "

## 2024-11-01 NOTE — ED Notes (Signed)
 Pt refused in and out cath. Stated, I don't need to go right now.

## 2024-11-01 NOTE — H&P (Incomplete)
 " History and Physical  Diana Mathews FMW:[id number] DOB: 25-May-1951 DOA: 11/01/2024  Referring physician: Birdena Clarity, PA-EDP  PCP: Diana Lowers, MD  Outpatient Specialists: Hematology oncology. Patient coming from: Home.  Chief Complaint: Altered mental status, fall  HPI: Diana Mathews is a 74 y.o. female with medical history significant for metastatic breast cancer with mets to the bone on letrozole , mood disorder, type 2 diabetes, hyperlipidemia, essential hypertension, iron deficiency anemia, chronic insomnia, severe morbid obesity, CKD 4, who presents to the ER due to altered mental status, generalized weakness and a fall.  Reportedly patient fell off the toilet today she fell on the floor for unknown amount of time.  Her son found her on the floor and was noted to be confused.  In the ER, her mentation was improved.  CT head was nonacute.  Lab studies were notable for elevated creatinine 3.06 above baseline of 1.84 and CPK of 1380.  UA was positive for pyuria.    The patient received IV fluid hydration 1 L NS bolus.  TRH, hospitalist service, was asked to admit for AKI on CKD 4.  ED Course: Temperature 98.2.  BP 120/54, pulse 95, respiratory rate 20, O2 saturation 92% on room air.  Review of Systems: Review of systems as noted in the HPI. All other systems reviewed and are negative.   Past Medical History:  Diagnosis Date   Atrial fibrillation (HCC) 06/12/2018   Depression    Essential hypertension    Headache    Hyperlipidemia    Metastatic breast cancer    Left breast, metastatic to bone and with pulmonary nodules - [facility]   Osteoarthritis    Sleep apnea    TIA (transient ischemic attack)    Type 2 diabetes mellitus (HCC)    Past Surgical History:  Procedure Laterality Date   ABDOMINAL HYSTERECTOMY     CHOLECYSTECTOMY     FOOT SURGERY Left    HERNIA REPAIR     TUBAL LIGATION     WRIST SURGERY Bilateral    Carpal Tunnel    Social History:  reports  that she quit smoking about 39 years ago. Her smoking use included cigarettes. She has never used smokeless tobacco. She reports that she does not drink alcohol  and does not use drugs.   Allergies[1]  Family History  Problem Relation Age of Onset   Arthritis Mother    COPD Mother    Diabetes Mother    Hypertension Mother    Stroke Mother    Arthritis Sister    Diabetes Brother    Arthritis Sister    Diabetes Sister    Diabetes Sister    Anxiety disorder Sister    Depression Sister    Diabetes Brother    Mental illness Brother    Obesity Brother       Prior to Admission medications  Medication Sig Start Date End Date Taking? Authorizing Provider  apixaban  (ELIQUIS ) 5 MG TABS tablet Take 1 tablet (5 mg total) by mouth 2 (two) times daily. 02/24/24   Diana Lowers, MD  ARIPiprazole  (ABILIFY ) 5 MG tablet Take 1 tablet (5 mg total) by mouth daily. 10/06/24   Diana Lowers, MD  atorvastatin  (LIPITOR) 80 MG tablet TAKE ONE (1) TABLET BY MOUTH EVERY DAY 10/14/24   Diana Lowers, MD  baclofen (LIORESAL) 10 MG tablet Take 10 mg by mouth 4 (four) times daily. 04/17/22   [provider]  Blood Glucose Monitoring Suppl (ACCU-CHEK AVIVA PLUS) w/Device KIT Use to  check glucose before breakfast and 2 hours after supper 10/06/24   Diana Lowers, MD  Blood Glucose Monitoring Suppl DEVI 1 each by Does not apply route in the morning, at noon, and at bedtime. May substitute to any manufacturer covered by patient's insurance. 04/28/23   Diana Lowers, MD  busPIRone  (BUSPAR ) 10 MG tablet Take 1 tablet (10 mg total) by mouth 3 (three) times daily as needed. 10/06/24   Diana Lowers, MD  carvedilol  (COREG ) 6.25 MG tablet TAKE 1 TABLET BY MOUTH TWICE DAILY WITH FOOD 09/16/24   Diana Lowers, MD  diltiazem  (CARDIZEM  CD) 240 MG 24 hr capsule TAKE ONE (1) CAPSULE BY MOUTH 2 TIMES DAILY 09/16/24   Diana Lowers, MD  diphenhydrAMINE (BENADRYL) 25 mg capsule Take 25 mg by mouth at bedtime as needed.     [provider]  DULoxetine  (CYMBALTA ) 60 MG capsule TAKE 1 CAPSULE BY MOUTH EVERY MORNING & AT BEDTIME 10/06/24   Diana Lowers, MD  furosemide (LASIX) 40 MG tablet Take 40 mg by mouth daily.    [provider]  gabapentin  (NEURONTIN ) 300 MG capsule Take 300 mg by mouth 3 (three) times daily. 05/19/24   [provider]  glucose blood (ONETOUCH VERIO) test strip CHECK MORNING, NOON, & BEDTIME Dx E11.8 04/18/24   Diana Lowers, MD  HYDROcodone-acetaminophen  [facility]) 10-325 MG tablet Take 1 tablet by mouth every 6 (six) hours as needed.    [provider]  letrozole  (FEMARA ) 2.5 MG tablet Take 2.5 mg by mouth daily. 04/02/22   [provider]  metFORMIN  (GLUCOPHAGE -XR) 750 MG 24 hr tablet TAKE ONE (1) TABLET BY MOUTH TWO (2) TIMES DAILY 10/06/24   Diana Lowers, MD  palbociclib [facility]) 125 MG tablet Take by mouth. 04/09/20   [provider]  promethazine  (PHENERGAN ) 25 MG tablet TAKE 1 TABLET BY MOUTH EVERY 6 HOURS AS NEEDED FOR NAUSEA & VOMITING 06/20/24   Diana Lowers, MD  rOPINIRole  (REQUIP ) 1 MG tablet Take 1 tablet (1 mg total) by mouth at bedtime. 10/06/24   Diana Lowers, MD  tirzepatide  (MOUNJARO ) 7.5 MG/0.5ML Pen Inject 7.5 mg into the skin once a week. 07/06/24   Diana Lowers, MD  trazodone  (DESYREL ) 300 MG tablet TAKE 1/3 TO 1 TABLET AT BEDTIME AS NEEDED FOR SLEEP 09/18/24   Diana Lowers, MD  TRUEplus Lancets 33G MISC Test BS QID Dx E11.8 08/20/21   Diana Lowers, MD    Physical Exam: BP (!) 176/72 (BP Location: Right Arm)   Pulse 80   Temp 97.8 F (36.6 C) (Oral)   Resp 18   Ht 5' 3 (1.6 m)   Wt 107.5 kg   SpO2 95%   BMI 41.98 kg/m   General: 74 y.o. year-old female well developed well nourished in no acute distress.  Alert and oriented x3. Cardiovascular: Regular rate and rhythm with no rubs or gallops.  No thyromegaly or JVD noted.  No lower extremity edema. 2/4 pulses in all 4 extremities. Respiratory: Clear to  auscultation with no wheezes or rales. Good inspiratory effort. Abdomen: Soft nontender nondistended with normal bowel sounds x4 quadrants. Muskuloskeletal: No cyanosis, clubbing or edema noted bilaterally Neuro: CN II-XII intact, strength, sensation, reflexes Skin: No ulcerative lesions noted or rashes Psychiatry: Judgement and insight appear normal. Mood is appropriate for condition and setting          Labs on Admission:  Basic Metabolic Panel: Recent Labs  Lab 11/01/24 1711  NA 140  K 4.7  CL 103  CO2  17*  GLUCOSE 152*  BUN 34*  CREATININE 3.06*  CALCIUM  10.6*   Liver Function Tests: No results for input(s): AST, ALT, ALKPHOS, BILITOT, PROT, ALBUMIN in the last 168 hours. No results for input(s): LIPASE, AMYLASE in the last 168 hours. No results for input(s): AMMONIA in the last 168 hours. CBC: Recent Labs  Lab 11/01/24 1711  WBC 6.9  NEUTROABS 3.2  HGB 11.1*  HCT 32.9*  MCV 100.0  PLT 358   Cardiac Enzymes: Recent Labs  Lab 11/01/24 1711  CKTOTAL 1,380*    BNP (last 3 results) No results for input(s): BNP in the last 8760 hours.  ProBNP (last 3 results) No results for input(s): PROBNP in the last 8760 hours.  CBG: Recent Labs  Lab 11/01/24 1251  GLUCAP 164*    Radiological Exams on Admission: CT Head Wo Contrast Result Date: 11/01/2024 CLINICAL DATA:  Generalized weakness. EXAM: CT HEAD WITHOUT CONTRAST TECHNIQUE: Contiguous axial images were obtained from the base of the skull through the vertex without intravenous contrast. RADIATION DOSE REDUCTION: This exam was performed according to the departmental dose-optimization program which includes automated exposure control, adjustment of the mA and/or kV according to patient size and/or use of iterative reconstruction technique. COMPARISON:  August 28, 2020 FINDINGS: Brain: No evidence of acute infarction, hemorrhage, hydrocephalus, extra-axial collection or mass lesion/mass effect.  Vascular: No hyperdense vessel or unexpected calcification. Skull: Normal. Negative for fracture or focal lesion. Sinuses/Orbits: No acute finding. Other: None. IMPRESSION: No acute intracranial pathology. Electronically Signed   By: Suzen Dials M.D.   On: 11/01/2024 18:47    EKG: I independently viewed the EKG done and my findings are as followed: None available at this time of this visit.  Assessment/Plan Present on Admission:  AKI (acute kidney injury)  Principal Problem:   AKI (acute kidney injury)  AKI on CKD 4, suspect prerenal in the setting of dehydration from poor oral intake. Baseline creatinine 1.8 with GFR of 29 Presented with creatinine of 3.06 with GFR of 16. IV fluid hydration LR at 100 cc/h x 1. Monitor urine output Avoid nephrotoxic agents, dehydration, and hypotension Repeat BMP in the morning.  High anion gap metabolic acidosis secondary to acute renal insufficiency Serum bicarb 17, anion gap 19 Continue IV fluid hydration Repeat BMP in the morning  Elevated CPK Continue IV fluid as stated above Hold off home Lipitor 80 mg daily for now to avoid worsening of CPK elevation. Follow repeat CPK in the morning  Hyperkalemia Serum potassium 5.3 Lokelma  10 g x 2 Repeat BMP in the morning  Hypercalcemia in the setting of bone metastasis Monitor calcium  level Continue IV fluid hydration  Metastatic breast cancer with bone mets Follows with oncology outpatient Resume home letrozole   Anemia of chronic disease Hemoglobin 11.1, at baseline No overt bleeding reported Continue to monitor H&H  Severe morbid obesity BMI 41 Recommend weight loss outpatient with healthy diet  Mood disorder Resume home regimen.  Paroxysmal A-fib on Eliquis  Resume home Eliquis  Resume home Coreg  Rate is currently controlled Continue to monitor on telemetry  Type 2 diabetes with hyperglycemia Last hemoglobin A1c 6.8 in 2019 Follow-up updated A1c Heart healthy, carb  modified diet Insulin  coverage  Diabetic polyneuropathy Resume home regimen  Generalized weakness PT OT evaluation Fall precautions    Time: 75 minutes.    DVT prophylaxis: Home Eliquis  twice daily  Code Status: Full code.  Family Communication: None at bedside.  Disposition Plan: Admitted to telemetry unit.  Consults called: None.  Admission status: Observation status.   Status is: Observation    Terry LOISE Hurst MD [facility] Pager [phone]  If 7PM-7AM, please contact night-coverage [email or web address] Password [email or web address]  11/01/2024, 8:57 PM      [1]  Allergies Allergen Reactions   Codeine Rash   "

## 2024-11-02 DIAGNOSIS — Z7901 Long term (current) use of anticoagulants: Secondary | ICD-10-CM | POA: Diagnosis not present

## 2024-11-02 DIAGNOSIS — C7951 Secondary malignant neoplasm of bone: Secondary | ICD-10-CM | POA: Diagnosis present

## 2024-11-02 DIAGNOSIS — N179 Acute kidney failure, unspecified: Secondary | ICD-10-CM | POA: Diagnosis present

## 2024-11-02 DIAGNOSIS — E860 Dehydration: Secondary | ICD-10-CM | POA: Diagnosis present

## 2024-11-02 DIAGNOSIS — I130 Hypertensive heart and chronic kidney disease with heart failure and stage 1 through stage 4 chronic kidney disease, or unspecified chronic kidney disease: Secondary | ICD-10-CM | POA: Diagnosis present

## 2024-11-02 DIAGNOSIS — E785 Hyperlipidemia, unspecified: Secondary | ICD-10-CM | POA: Diagnosis present

## 2024-11-02 DIAGNOSIS — L89892 Pressure ulcer of other site, stage 2: Secondary | ICD-10-CM | POA: Diagnosis present

## 2024-11-02 DIAGNOSIS — D638 Anemia in other chronic diseases classified elsewhere: Secondary | ICD-10-CM | POA: Diagnosis present

## 2024-11-02 DIAGNOSIS — F39 Unspecified mood [affective] disorder: Secondary | ICD-10-CM | POA: Diagnosis present

## 2024-11-02 DIAGNOSIS — Z79899 Other long term (current) drug therapy: Secondary | ICD-10-CM | POA: Diagnosis not present

## 2024-11-02 DIAGNOSIS — E66813 Obesity, class 3: Secondary | ICD-10-CM | POA: Diagnosis present

## 2024-11-02 DIAGNOSIS — E872 Acidosis, unspecified: Secondary | ICD-10-CM | POA: Diagnosis present

## 2024-11-02 DIAGNOSIS — N184 Chronic kidney disease, stage 4 (severe): Secondary | ICD-10-CM | POA: Diagnosis present

## 2024-11-02 DIAGNOSIS — E1142 Type 2 diabetes mellitus with diabetic polyneuropathy: Secondary | ICD-10-CM | POA: Diagnosis present

## 2024-11-02 DIAGNOSIS — I5032 Chronic diastolic (congestive) heart failure: Secondary | ICD-10-CM | POA: Diagnosis present

## 2024-11-02 DIAGNOSIS — Z8249 Family history of ischemic heart disease and other diseases of the circulatory system: Secondary | ICD-10-CM | POA: Diagnosis not present

## 2024-11-02 DIAGNOSIS — E1165 Type 2 diabetes mellitus with hyperglycemia: Secondary | ICD-10-CM | POA: Diagnosis present

## 2024-11-02 DIAGNOSIS — E1122 Type 2 diabetes mellitus with diabetic chronic kidney disease: Secondary | ICD-10-CM | POA: Diagnosis present

## 2024-11-02 DIAGNOSIS — Z7984 Long term (current) use of oral hypoglycemic drugs: Secondary | ICD-10-CM | POA: Diagnosis not present

## 2024-11-02 DIAGNOSIS — E875 Hyperkalemia: Secondary | ICD-10-CM | POA: Diagnosis present

## 2024-11-02 DIAGNOSIS — Z7985 Long-term (current) use of injectable non-insulin antidiabetic drugs: Secondary | ICD-10-CM | POA: Diagnosis not present

## 2024-11-02 DIAGNOSIS — I480 Paroxysmal atrial fibrillation: Secondary | ICD-10-CM | POA: Diagnosis present

## 2024-11-02 DIAGNOSIS — Z6841 Body Mass Index (BMI) 40.0 and over, adult: Secondary | ICD-10-CM | POA: Diagnosis not present

## 2024-11-02 LAB — CBC
HCT: 30.4 % — ABNORMAL LOW (ref 36.0–46.0)
Hemoglobin: 10 g/dL — ABNORMAL LOW (ref 12.0–15.0)
MCH: 33.1 pg (ref 26.0–34.0)
MCHC: 32.9 g/dL (ref 30.0–36.0)
MCV: 100.7 fL — ABNORMAL HIGH (ref 80.0–100.0)
Platelets: 304 K/uL (ref 150–400)
RBC: 3.02 MIL/uL — ABNORMAL LOW (ref 3.87–5.11)
RDW: 16.7 % — ABNORMAL HIGH (ref 11.5–15.5)
WBC: 5.3 K/uL (ref 4.0–10.5)
nRBC: 0 % (ref 0.0–0.2)

## 2024-11-02 LAB — BASIC METABOLIC PANEL WITH GFR
Anion gap: 14 (ref 5–15)
BUN: 30 mg/dL — ABNORMAL HIGH (ref 8–23)
CO2: 20 mmol/L — ABNORMAL LOW (ref 22–32)
Calcium: 9.7 mg/dL (ref 8.9–10.3)
Chloride: 108 mmol/L (ref 98–111)
Creatinine, Ser: 2.54 mg/dL — ABNORMAL HIGH (ref 0.44–1.00)
GFR, Estimated: 20 mL/min — ABNORMAL LOW
Glucose, Bld: 125 mg/dL — ABNORMAL HIGH (ref 70–99)
Potassium: 5.3 mmol/L — ABNORMAL HIGH (ref 3.5–5.1)
Sodium: 142 mmol/L (ref 135–145)

## 2024-11-02 LAB — HEMOGLOBIN A1C
Hgb A1c MFr Bld: 8.3 % — ABNORMAL HIGH (ref 4.8–5.6)
Mean Plasma Glucose: 191.51 mg/dL

## 2024-11-02 LAB — GLUCOSE, CAPILLARY
Glucose-Capillary: 120 mg/dL — ABNORMAL HIGH (ref 70–99)
Glucose-Capillary: 124 mg/dL — ABNORMAL HIGH (ref 70–99)
Glucose-Capillary: 127 mg/dL — ABNORMAL HIGH (ref 70–99)
Glucose-Capillary: 146 mg/dL — ABNORMAL HIGH (ref 70–99)
Glucose-Capillary: 151 mg/dL — ABNORMAL HIGH (ref 70–99)

## 2024-11-02 LAB — MAGNESIUM: Magnesium: 2.2 mg/dL (ref 1.7–2.4)

## 2024-11-02 LAB — PHOSPHORUS: Phosphorus: 4.5 mg/dL (ref 2.5–4.6)

## 2024-11-02 LAB — CK: Total CK: 899 U/L — ABNORMAL HIGH (ref 38–234)

## 2024-11-02 MED ORDER — LETROZOLE 2.5 MG PO TABS
2.5000 mg | ORAL_TABLET | Freq: Every day | ORAL | Status: DC
  Administered 2024-11-02 – 2024-11-03 (×2): 2.5 mg via ORAL
  Filled 2024-11-02 (×3): qty 1

## 2024-11-02 MED ORDER — DULOXETINE HCL 60 MG PO CPEP
60.0000 mg | ORAL_CAPSULE | Freq: Every day | ORAL | Status: DC
  Administered 2024-11-02 – 2024-11-03 (×2): 60 mg via ORAL
  Filled 2024-11-02 (×2): qty 1

## 2024-11-02 MED ORDER — SODIUM CHLORIDE 0.9 % IV SOLN
1.0000 g | INTRAVENOUS | Status: DC
  Administered 2024-11-02 – 2024-11-03 (×2): 1 g via INTRAVENOUS
  Filled 2024-11-02 (×2): qty 10

## 2024-11-02 MED ORDER — LACTATED RINGERS IV SOLN
INTRAVENOUS | Status: AC
  Administered 2024-11-02: 100 mL/h via INTRAVENOUS

## 2024-11-02 MED ORDER — SODIUM ZIRCONIUM CYCLOSILICATE 10 G PO PACK
10.0000 g | PACK | Freq: Two times a day (BID) | ORAL | Status: AC
  Administered 2024-11-02 (×2): 10 g via ORAL
  Filled 2024-11-02 (×2): qty 1

## 2024-11-02 MED ORDER — GABAPENTIN 100 MG PO CAPS
100.0000 mg | ORAL_CAPSULE | Freq: Three times a day (TID) | ORAL | Status: DC
  Administered 2024-11-02 – 2024-11-03 (×4): 100 mg via ORAL
  Filled 2024-11-02 (×4): qty 1

## 2024-11-02 MED ORDER — CARVEDILOL 3.125 MG PO TABS
3.1250 mg | ORAL_TABLET | Freq: Two times a day (BID) | ORAL | Status: DC
  Administered 2024-11-02 – 2024-11-03 (×3): 3.125 mg via ORAL
  Filled 2024-11-02 (×3): qty 1

## 2024-11-02 MED ORDER — ARIPIPRAZOLE 5 MG PO TABS
5.0000 mg | ORAL_TABLET | Freq: Every day | ORAL | Status: DC
  Administered 2024-11-02 – 2024-11-03 (×2): 5 mg via ORAL
  Filled 2024-11-02 (×2): qty 1

## 2024-11-02 MED ORDER — ROPINIROLE HCL 1 MG PO TABS
1.0000 mg | ORAL_TABLET | Freq: Every day | ORAL | Status: DC
  Administered 2024-11-02: 1 mg via ORAL
  Filled 2024-11-02: qty 1

## 2024-11-02 NOTE — Evaluation (Signed)
 Occupational Therapy Evaluation Patient Details Name: Diana Mathews MRN: [id number] DOB: [birth year] Today's Date: 11/02/2024   History of Present Illness   Diana Mathews is a 74 y.o. female with medical history significant for metastatic breast cancer with mets to the bone on letrozole , mood disorder, type 2 diabetes, hyperlipidemia, essential hypertension, iron deficiency anemia, chronic insomnia, severe morbid obesity, CKD 4, who presents to the ER due to altered mental status, generalized weakness and a fall.  Reportedly patient fell off the toilet today she fell on the floor for unknown amount of time.  Her son found her on the floor and was noted to be confused. (per DO)     Clinical Impressions Pt agreeable to OT and PT co-evaluation. Pt lives alone at baseline with independence for ADL's. Today pt required CGA to min A for functional mobility to chair and BSC with RW. Min A also needed for supine to sit bed mobility. Pt demonstrates B UE general weakness with WFL A/ROM. Assist needed for lower body ADL's today based on pt's inability to doff socks while seated in the chair. Pt left in the chair with call bell within reach. Pt will benefit from continued OT in the hospital to increase strength, balance, and endurance for safe ADL's.        If plan is discharge home, recommend the following:   A little help with walking and/or transfers;A lot of help with bathing/dressing/bathroom;Assistance with cooking/housework;Assist for transportation;Help with stairs or ramp for entrance     Functional Status Assessment   Patient has had a recent decline in their functional status and demonstrates the ability to make significant improvements in function in a reasonable and predictable amount of time.     Equipment Recommendations   BSC/3in1             Precautions/Restrictions   Precautions Precautions: Fall Recall of Precautions/Restrictions: Intact Restrictions Weight  Bearing Restrictions Per Provider Order: No     Mobility Bed Mobility Overal bed mobility: Needs Assistance Bed Mobility: Supine to Sit     Supine to sit: Min assist, HOB elevated     General bed mobility comments: labored; hand held assist for trunk control; HOB raised significantly.    Transfers Overall transfer level: Needs assistance Equipment used: Rolling walker (2 wheels) Transfers: Sit to/from Stand, Bed to chair/wheelchair/BSC Sit to Stand: Contact guard assist, Min assist     Step pivot transfers: Contact guard assist     General transfer comment: EOB to chair with RW      Balance Overall balance assessment: Needs assistance Sitting-balance support: No upper extremity supported, Feet supported Sitting balance-Leahy Scale: Fair Sitting balance - Comments: fair to good seated at EOB   Standing balance support: Bilateral upper extremity supported, During functional activity, Reliant on assistive device for balance Standing balance-Leahy Scale: Fair Standing balance comment: using RW                           ADL either performed or assessed with clinical judgement   ADL Overall ADL's : Needs assistance/impaired     Grooming: Set up;Sitting   Upper Body Bathing: Modified independent;Set up;Sitting   Lower Body Bathing: Moderate assistance;Sitting/lateral leans   Upper Body Dressing : Modified independent;Set up;Sitting   Lower Body Dressing: Moderate assistance;Sitting/lateral leans Lower Body Dressing Details (indicate cue type and reason): Based on pt's report of inability to doff socks and demonstration of functional reach below knees. Toilet  Transfer: Contact guard assist;Minimal assistance;Rolling walker (2 wheels);BSC/3in1 Toilet Transfer Details (indicate cue type and reason): Chair to [facility]; initially attempted ambulatory transfer to toilet but pt felt too weak and preferred the [facility] besdie the chair. Toileting- Clothing Manipulation and  Hygiene: Minimal assistance;Contact guard assist;Sitting/lateral lean;Sit to/from stand       Functional mobility during ADLs: Contact guard assist;Minimal assistance;Rolling walker (2 wheels)       Vision Baseline Vision/History: 1 Wears glasses Ability to See in Adequate Light: 0 Adequate Patient Visual Report: No change from baseline Vision Assessment?: Wears glasses for reading     Perception Perception: Not tested       Praxis Praxis: Not tested       Pertinent Vitals/Pain Pain Assessment Pain Assessment: Faces Faces Pain Scale: Hurts a little bit Pain Location: B LE Pain Descriptors / Indicators: Sore Pain Intervention(s): Limited activity within patient's tolerance, Monitored during session, Repositioned     Extremity/Trunk Assessment Upper Extremity Assessment Upper Extremity Assessment: Generalized weakness   Lower Extremity Assessment Lower Extremity Assessment: Defer to PT evaluation   Cervical / Trunk Assessment Cervical / Trunk Assessment: Kyphotic   Communication Communication Communication: No apparent difficulties   Cognition Arousal: Alert Behavior During Therapy: WFL for tasks assessed/performed Cognition: No apparent impairments                               Following commands: Intact       Cueing  General Comments   Cueing Techniques: Verbal cues  Pt shaking reportedly from being cold.              Home Living Family/patient expects to be discharged to:: Private residence Living Arrangements: Alone Available Help at Discharge: Family;Available PRN/intermittently Type of Home: Apartment Home Access: Level entry     Home Layout: One level     Bathroom Shower/Tub: Tub/shower unit;Sponge bathes at baseline (Difficulty getting in tub.)   Bathroom Toilet: Handicapped height     Home Equipment: Agricultural Consultant (2 wheels);Cane - quad;Shower seat;Grab bars - tub/shower          Prior Functioning/Environment  Prior Level of Function : Needs assist;History of Falls (last six months) (2 falls)       Physical Assist : ADLs (physical)   ADLs (physical): IADLs Mobility Comments: Household ambulator with rollator; SPC used in community. ADLs Comments: Independent ADL's; IADL assist.    OT Problem List: Decreased strength;Decreased activity tolerance;Impaired balance (sitting and/or standing)   OT Treatment/Interventions: Self-care/ADL training;Therapeutic exercise;Therapeutic activities;Balance training;Patient/family education;DME and/or AE instruction      OT Goals(Current goals can be found in the care plan section)   Acute Rehab OT Goals Patient Stated Goal: Return home OT Goal Formulation: With patient Time For Goal Achievement: 11/16/24 Potential to Achieve Goals: Good   OT Frequency:  Min 2X/week    Co-evaluation PT/OT/SLP Co-Evaluation/Treatment: Yes Reason for Co-Treatment: To address functional/ADL transfers   OT goals addressed during session: ADL's and self-care                       End of Session Equipment Utilized During Treatment: Rolling walker (2 wheels)  Activity Tolerance: Patient tolerated treatment well Patient left: in chair;with call bell/phone within reach;with chair alarm set  OT Visit Diagnosis: Unsteadiness on feet (R26.81);Other abnormalities of gait and mobility (R26.89);Muscle weakness (generalized) (M62.81);History of falling (Z91.81)  Time: 9094-9075 OT Time Calculation (min): 19 min Charges:  OT General Charges $OT Visit: 1 Visit OT Evaluation $OT Eval Low Complexity: 1 Low  Madalynne Gutmann OT, MOT  Jayson Person 11/02/2024, 10:48 AM

## 2024-11-02 NOTE — Evaluation (Signed)
 Physical Therapy Evaluation Patient Details Name: MALARIE TAPPEN MRN: [id number] DOB: June 24, 1951 Today's Date: 11/02/2024  History of Present Illness  ELANNA BERT is a 74 y.o. female with medical history significant for metastatic breast cancer with mets to the bone on letrozole , mood disorder, type 2 diabetes, hyperlipidemia, essential hypertension, iron deficiency anemia, chronic insomnia, severe morbid obesity, CKD 4, who presents to the ER due to altered mental status, generalized weakness and a fall.  Reportedly patient fell off the toilet today she fell on the floor for unknown amount of time.  Her son found her on the floor and was noted to be confused.   Clinical Impression  Patient was agreeable to PT/OT co-evaluation. Patient reports at baseline, she ambulates in community with AD, is modified independent with ADLs, and has had 2 falls in last 6 months.  This date, patient requires min assist for bed mobility, and min/CGA during transfers, (including STS and bed<>BSC), and ambulation with RW. Pt limited most due to general weakness and fatigue. Pt reports using SPC and Rollator at home. One fall when using SPC. Highly encouraged use of rollator only once home for safest option. Pt reports understanding. Pt tolerates sitting in chair at end of session, chair alarm set, all needs met and call button in reach. Patient will benefit from continued skilled physical therapy acutely and in recommended venue in order to address the above/below deficits and improve overall function.        If plan is discharge home, recommend the following: A little help with walking and/or transfers;A little help with bathing/dressing/bathroom;Assist for transportation   Can travel by private vehicle        Equipment Recommendations None recommended by PT  Recommendations for Other Services       Functional Status Assessment Patient has had a recent decline in their functional status and demonstrates  the ability to make significant improvements in function in a reasonable and predictable amount of time.     Precautions / Restrictions Precautions Precautions: Fall Recall of Precautions/Restrictions: Intact Restrictions Weight Bearing Restrictions Per Provider Order: No      Mobility  Bed Mobility Overal bed mobility: Needs Assistance Bed Mobility: Supine to Sit     Supine to sit: Min assist, HOB elevated     General bed mobility comments: HOB elevated, mild assist for trunk control    Transfers Overall transfer level: Needs assistance Equipment used: Rolling walker (2 wheels) Transfers: Sit to/from Stand, Bed to chair/wheelchair/BSC Sit to Stand: Contact guard assist, Min assist   Step pivot transfers: Contact guard assist       General transfer comment: min/CGA with STS from bed and BSC w/ RW due to inc weakness and fatigue, slow labored movement throughout    Ambulation/Gait Ambulation/Gait assistance: Contact guard assist Gait Distance (Feet): 10 Feet Assistive device: Rolling walker (2 wheels) Gait Pattern/deviations: Step-through pattern, Decreased step length - right, Decreased step length - left, Decreased stride length, Trunk flexed Gait velocity: Dec     General Gait Details: Pt limited to side and forward/backwards steps at bedside wit RW, demo slow labored movement, limited most due to inc fatigue, no overt LOB but mildly unsteady/shaky, pt reporting being cold causing shakiness  Stairs            Wheelchair Mobility     Tilt Bed    Modified Rankin (Stroke Patients Only)       Balance Overall balance assessment: Needs assistance Sitting-balance support: No upper extremity supported,  Feet supported Sitting balance-Leahy Scale: Fair Sitting balance - Comments: fair to good seated at EOB   Standing balance support: Bilateral upper extremity supported, During functional activity, Reliant on assistive device for balance Standing  balance-Leahy Scale: Fair Standing balance comment: using RW                             Pertinent Vitals/Pain Pain Assessment Pain Assessment: Faces Faces Pain Scale: Hurts a little bit Pain Location: B LE Pain Descriptors / Indicators: Sore Pain Intervention(s): Limited activity within patient's tolerance, Monitored during session, Repositioned    Home Living Family/patient expects to be discharged to:: Private residence Living Arrangements: Alone Available Help at Discharge: Family;Available PRN/intermittently Type of Home: Apartment Home Access: Level entry       Home Layout: One level Home Equipment: Agricultural Consultant (2 wheels);Cane - quad;Shower seat;Grab bars - tub/shower;Rollator (4 wheels)      Prior Function Prior Level of Function : Needs assist;History of Falls (last six months)       Physical Assist : ADLs (physical);Mobility (physical) Mobility (physical): Gait ADLs (physical): IADLs Mobility Comments: Reports as community ambulator with SPC or rollator, 2 falls in last 6 months, does not drive ADLs Comments: Independent ADL's; IADL assist.     Extremity/Trunk Assessment   Upper Extremity Assessment Upper Extremity Assessment: Defer to OT evaluation    Lower Extremity Assessment Lower Extremity Assessment: Generalized weakness (ankle DF MMT 4+/5, hip flexion MMT 4-/5, all bilaterally)    Cervical / Trunk Assessment Cervical / Trunk Assessment: Kyphotic  Communication   Communication Communication: No apparent difficulties    Cognition Arousal: Alert Behavior During Therapy: WFL for tasks assessed/performed                           PT - Cognition Comments: pt alert and oriented x4 Following commands: Intact       Cueing Cueing Techniques: Verbal cues, Visual cues     General Comments General comments (skin integrity, edema, etc.): Pt shaking reportedly from being cold.    Exercises     Assessment/Plan    PT  Assessment Patient needs continued PT services;All further PT needs can be met in the next venue of care  PT Problem List Decreased strength;Decreased range of motion;Decreased activity tolerance;Decreased balance;Decreased mobility       PT Treatment Interventions DME instruction;Balance training;Gait training;Therapeutic activities;Therapeutic exercise;Functional mobility training;Patient/family education    PT Goals (Current goals can be found in the Care Plan section)  Acute Rehab PT Goals Patient Stated Goal: Return home with HHPT PT Goal Formulation: With patient Time For Goal Achievement: 11/04/24 Potential to Achieve Goals: Good    Frequency Min 3X/week     Co-evaluation PT/OT/SLP Co-Evaluation/Treatment: Yes Reason for Co-Treatment: To address functional/ADL transfers PT goals addressed during session: Mobility/safety with mobility OT goals addressed during session: ADL's and self-care       AM-PAC PT 6 Clicks Mobility  Outcome Measure Help needed turning from your back to your side while in a flat bed without using bedrails?: None Help needed moving from lying on your back to sitting on the side of a flat bed without using bedrails?: A Little Help needed moving to and from a bed to a chair (including a wheelchair)?: A Little Help needed standing up from a chair using your arms (e.g., wheelchair or bedside chair)?: A Little Help needed to walk in hospital room?:  A Little Help needed climbing 3-5 steps with a railing? : A Lot 6 Click Score: 18    End of Session Equipment Utilized During Treatment: Gait belt Activity Tolerance: Patient tolerated treatment well;Patient limited by fatigue Patient left: in chair;with call bell/phone within reach;with chair alarm set   PT Visit Diagnosis: Unsteadiness on feet (R26.81);History of falling (Z91.81);Other abnormalities of gait and mobility (R26.89);Difficulty in walking, not elsewhere classified (R26.2)    Time:  9093-9076 PT Time Calculation (min) (ACUTE ONLY): 17 min   Charges:   PT Evaluation $PT Eval Low Complexity: 1 Low   PT General Charges $$ ACUTE PT VISIT: 1 Visit        11:27 AM, 11/02/2024 Rosaria Settler, PT, DPT [address] with [facility]

## 2024-11-02 NOTE — Plan of Care (Signed)
" °  Problem: Acute Rehab PT Goals(only PT should resolve) Goal: Pt Will Go Supine/Side To Sit Outcome: Progressing Flowsheets (Taken 11/02/2024 1129) Pt will go Supine/Side to Sit: with supervision Goal: Patient Will Transfer Sit To/From Stand Outcome: Progressing Flowsheets (Taken 11/02/2024 1129) Patient will transfer sit to/from stand: with supervision Goal: Pt Will Transfer Bed To Chair/Chair To Bed Outcome: Progressing Flowsheets (Taken 11/02/2024 1129) Pt will Transfer Bed to Chair/Chair to Bed: with supervision Goal: Pt Will Ambulate Outcome: Progressing Flowsheets (Taken 11/02/2024 1129) Pt will Ambulate:  25 feet  with supervision  with rolling walker    11:29 AM, 11/02/2024 Rosaria Settler, PT, DPT [place] with [facility]  "

## 2024-11-02 NOTE — Progress Notes (Signed)
 " PROGRESS NOTE    Diana Mathews  FMW:[id number] DOB: 20-Dec-1950 DOA: 11/01/2024 PCP: Zollie Lowers, MD   Brief Narrative:   74 y.o. female with medical history significant for metastatic breast cancer with mets to the bone on letrozole , mood disorder, type 2 diabetes, hyperlipidemia, essential hypertension, iron deficiency anemia, chronic insomnia, severe morbid obesity, CKD 4, who presents to the ER due to altered mental status, generalized weakness and a fall.   Work up showed AKI on CKD 4 as well as elevated CPK levels. Improving numbers as well as clinical status Lives at home by herself and has a cane and walker. PT/OT consulted.  Assessment & Plan:  Principal Problem:   AKI (acute kidney injury)    AKI on CKD 4, suspect prerenal in the setting of dehydration from poor oral intake. Improvbing Baseline creatinine 1.8 with GFR of 29 Presented with creatinine of 3.06 with GFR of 16. Creatinine today is 2.5 IV fluid hydration LR at 100 cc/h, will stop today Monitor urine output Avoid nephrotoxic agents, dehydration, and hypotension Repeat BMP in the morning.   High anion gap metabolic acidosis secondary to acute renal insufficiency, improving Serum bicarb 17, anion gap 19 Continue IV fluid hydration Repeat BMP in the morning   Elevated CPK, improving Received IV fluid. Will stop today. Hold off home Lipitor 80 mg daily for now to avoid worsening of CPK elevation. Follow repeat CPK in the morning   Hyperkalemia Lokelma  10 g x 2 Repeat BMP in the morning   Hypercalcemia in the setting of bone metastasis Monitor calcium  level Received IV fluid hydration   Metastatic breast cancer with bone mets Follows with oncology outpatient Resumed home letrozole    Anemia of chronic disease Hemoglobin 11.1, at baseline No overt bleeding reported Continue to monitor H&H   Class III Obesity,POA: BMI 41 On Tirzepatide    Mood disorder Resumed Duloxetine  60 mg PO Daily    Paroxysmal A-fib on Eliquis  Resumed home Eliquis  Resumed home Coreg  Rate is currently controlled Follow up with EP Dr. Oneil Glatter at [facility]  Continue to monitor on telemetry   Type 2 diabetes with hyperglycemia Last hemoglobin A1c 6.8 in 2019 Follow-up updated A1c Heart healthy, carb modified diet Insulin  coverage  Chronic HFpEF,POA: Holding lasix. F/u with cardiologist at [facility] -No signs of exacerbation -TTE 01/2024 EF 55-60% as per last office visit note   Diabetic polyneuropathy Continue with gabapentin  100 mg PO TID   Generalized weakness/Physical deconditioning,POA: PT OT evaluation Fall precautions  Disposition: Lives at home by herself and has a cane and walker.     DVT prophylaxis:  apixaban  (ELIQUIS ) tablet 5 mg     Code Status: Full Code Family Communication:  None at the bedside Status is: Observation The patient remains OBS appropriate and will d/c before 2 midnights.    Subjective:  She feels better today. She remembers that she fell at home and her brother called EMS. She has one prior fall. She lives at home by herself and uses a cane and walker for ambulation. We spoke about improving kidney function and CPK levels. She hasn't eaten anything in the past 48 hours.   Examination:  General exam: Appears calm and comfortable  Respiratory system: Clear to auscultation. Respiratory effort normal. Cardiovascular system: S1 & S2 heard, RRR. No JVD, murmurs, rubs, gallops or clicks. No pedal edema. Gastrointestinal system: Abdomen is nondistended, soft and nontender. No organomegaly or masses felt. Normal bowel sounds heard. Central nervous system: Alert and oriented.  No focal neurological deficits. Extremities: Symmetric 5 x 5 power. Skin: No rashes, lesions or ulcers Psychiatry: Judgement and insight appear normal. Mood & affect appropriate.   Wound 11/02/24 0017 Pressure Injury Thigh Anterior;Left;Proximal Stage 2 -  Partial thickness loss of  dermis presenting as a shallow open injury with a red, pink wound bed without slough. (Active)     Diet Orders (From admission, onward)     Start     Ordered   11/01/24 2328  Diet heart healthy/carb modified Room service appropriate? Yes; Fluid consistency: Thin  Diet effective now       Question Answer Comment  Diet-HS Snack? Nothing   Room service appropriate? Yes   Fluid consistency: Thin      11/01/24 2329            Objective: Vitals:   11/01/24 2133 11/01/24 2145 11/01/24 2217 11/02/24 0201  BP:   (!) 164/64 (!) 120/54  Pulse: 90 98 (!) 102 95  Resp:   20 20  Temp:   97.7 F (36.5 C) 98.2 F (36.8 C)  TempSrc:   Oral Oral  SpO2: 93% 91% 95% 92%  Weight:      Height:        Intake/Output Summary (Last 24 hours) at 11/02/2024 0851 Last data filed at 11/02/2024 0843 Gross per 24 hour  Intake 120 ml  Output --  Net 120 ml   Filed Weights   11/01/24 1242  Weight: 107.5 kg    Scheduled Meds:  apixaban   5 mg Oral BID   ARIPiprazole   5 mg Oral Daily   carvedilol   3.125 mg Oral BID WC   DULoxetine   60 mg Oral Daily   gabapentin   100 mg Oral TID   insulin  aspart  0-5 Units Subcutaneous QHS   insulin  aspart  0-9 Units Subcutaneous TID WC   letrozole   2.5 mg Oral Daily   lidocaine   2 patch Transdermal Once   rOPINIRole   1 mg Oral QHS   sodium zirconium cyclosilicate   10 g Oral BID   Continuous Infusions:  cefTRIAXone  (ROCEPHIN )  IV 1 g (11/02/24 0541)   lactated ringers  100 mL/hr (11/02/24 0641)    Nutritional status     Body mass index is 41.98 kg/m.  Data Reviewed:   CBC: Recent Labs  Lab 11/01/24 1711 11/02/24 0425  WBC 6.9 5.3  NEUTROABS 3.2  --   HGB 11.1* 10.0*  HCT 32.9* 30.4*  MCV 100.0 100.7*  PLT 358 304   Basic Metabolic Panel: Recent Labs  Lab 11/01/24 1711 11/02/24 0425  NA 140 142  K 4.7 5.3*  CL 103 108  CO2 17* 20*  GLUCOSE 152* 125*  BUN 34* 30*  CREATININE 3.06* 2.54*  CALCIUM  10.6* 9.7  MG  --  2.2  PHOS  --   4.5   GFR: Estimated Creatinine Clearance: 23.9 mL/min (A) (by C-G formula based on SCr of 2.54 mg/dL (H)). Liver Function Tests: No results for input(s): AST, ALT, ALKPHOS, BILITOT, PROT, ALBUMIN in the last 168 hours. No results for input(s): LIPASE, AMYLASE in the last 168 hours. No results for input(s): AMMONIA in the last 168 hours. Coagulation Profile: No results for input(s): INR, PROTIME in the last 168 hours. Cardiac Enzymes: Recent Labs  Lab 11/01/24 1711 11/02/24 0425  CKTOTAL 1,380* 899*   BNP (last 3 results) No results for input(s): PROBNP in the last 8760 hours. HbA1C: No results for input(s): HGBA1C in the last 72 hours. CBG: Recent  Labs  Lab 11/01/24 1251 11/02/24 0010 11/02/24 0731  GLUCAP 164* 146* 120*   Lipid Profile: No results for input(s): CHOL, HDL, LDLCALC, TRIG, CHOLHDL, LDLDIRECT in the last 72 hours. Thyroid  Function Tests: No results for input(s): TSH, T4TOTAL, FREET4, T3FREE, THYROIDAB in the last 72 hours. Anemia Panel: No results for input(s): VITAMINB12, FOLATE, FERRITIN, TIBC, IRON, RETICCTPCT in the last 72 hours. Sepsis Labs: No results for input(s): PROCALCITON, LATICACIDVEN in the last 168 hours.  No results found for this or any previous visit (from the past 240 hours).       Radiology Studies: CT Head Wo Contrast Result Date: 11/01/2024 CLINICAL DATA:  Generalized weakness. EXAM: CT HEAD WITHOUT CONTRAST TECHNIQUE: Contiguous axial images were obtained from the base of the skull through the vertex without intravenous contrast. RADIATION DOSE REDUCTION: This exam was performed according to the departmental dose-optimization program which includes automated exposure control, adjustment of the mA and/or kV according to patient size and/or use of iterative reconstruction technique. COMPARISON:  August 28, 2020 FINDINGS: Brain: No evidence of acute infarction, hemorrhage,  hydrocephalus, extra-axial collection or mass lesion/mass effect. Vascular: No hyperdense vessel or unexpected calcification. Skull: Normal. Negative for fracture or focal lesion. Sinuses/Orbits: No acute finding. Other: None. IMPRESSION: No acute intracranial pathology. Electronically Signed   By: Suzen Dials M.D.   On: 11/01/2024 18:47           LOS: 0 days   Time spent= 35 mins    Deliliah Room, MD [facility]  If 7PM-7AM, please contact night-coverage  11/02/2024, 8:51 AM  "

## 2024-11-02 NOTE — Progress Notes (Addendum)
 Patient placed in OBSERVATION on 1/6 for AKI s/p being found down at home. Inpatient Care Manager (ICM) conducted chart review and contacted patient by phone to complete brief admission assessment.    Patient evaluated by PT with recommendations for HHPT & OT and a BSC.  CM visited with patient at bedside to discuss therapy recommendations.  Patient confirmed  she lives alone, but has a brother, Daughter, and Son who live in the area and check on her.  Patient agreed with PT and indicated she didn't have a preference for with [facility] or DMR agency.  CM forwarded referral to [facility] for [facility] and sent out referrals to area [facility] agencies for consideration, CM team will make selection based on best available [facility] agency.    Dr Dino anticipating patient will be ready to discharge tomorrow, Thurs, 1/8.   IPCM team will continue to follow along to finalize D/C plan arrangements and will monitor patient advancement through interdisciplinary progression rounds.      11/02/24 1125  TOC Brief Assessment  Insurance and Status Reviewed  Patient has primary care physician Yes  Home environment has been reviewed Home with Self  Prior level of function: Independent  Prior/Current Home Services No current home services  Social Drivers of Health Review SDOH reviewed no interventions necessary  Readmission risk has been reviewed Yes  Transition of care needs no transition of care needs at this time

## 2024-11-02 NOTE — Plan of Care (Signed)
" °  Problem: Acute Rehab OT Goals (only OT should resolve) Goal: Pt. Will Perform Grooming Flowsheets (Taken 11/02/2024 1050) Pt Will Perform Grooming:  with modified independence  standing Goal: Pt. Will Perform Lower Body Bathing Flowsheets (Taken 11/02/2024 1050) Pt Will Perform Lower Body Bathing: with modified independence Goal: Pt. Will Perform Lower Body Dressing Flowsheets (Taken 11/02/2024 1050) Pt Will Perform Lower Body Dressing: with modified independence Goal: Pt. Will Transfer To Toilet Flowsheets (Taken 11/02/2024 1050) Pt Will Transfer to Toilet:  with modified independence  ambulating Goal: Pt. Will Perform Toileting-Clothing Manipulation Flowsheets (Taken 11/02/2024 1050) Pt Will Perform Toileting - Clothing Manipulation and hygiene: with modified independence Goal: Pt/Caregiver Will Perform Home Exercise Program Flowsheets (Taken 11/02/2024 1050) Pt/caregiver will Perform Home Exercise Program:  Increased strength  Both right and left upper extremity  Independently  Iven Earnhart OT, MOT  "

## 2024-11-02 NOTE — Plan of Care (Signed)
  Problem: Education: Goal: Knowledge of General Education information will improve Description: Including pain rating scale, medication(s)/side effects and non-pharmacologic comfort measures Outcome: Progressing   Problem: Clinical Measurements: Goal: Ability to maintain clinical measurements within normal limits will improve Outcome: Progressing Goal: Cardiovascular complication will be avoided Outcome: Progressing   Problem: Activity: Goal: Risk for activity intolerance will decrease Outcome: Progressing   Problem: Nutrition: Goal: Adequate nutrition will be maintained Outcome: Progressing   Problem: Coping: Goal: Level of anxiety will decrease Outcome: Progressing   Problem: Elimination: Goal: Will not experience complications related to urinary retention Outcome: Progressing   Problem: Pain Managment: Goal: General experience of comfort will improve and/or be controlled Outcome: Progressing

## 2024-11-03 LAB — BASIC METABOLIC PANEL WITH GFR
Anion gap: 15 (ref 5–15)
BUN: 18 mg/dL (ref 8–23)
CO2: 21 mmol/L — ABNORMAL LOW (ref 22–32)
Calcium: 9.2 mg/dL (ref 8.9–10.3)
Chloride: 103 mmol/L (ref 98–111)
Creatinine, Ser: 1.53 mg/dL — ABNORMAL HIGH (ref 0.44–1.00)
GFR, Estimated: 36 mL/min — ABNORMAL LOW
Glucose, Bld: 110 mg/dL — ABNORMAL HIGH (ref 70–99)
Potassium: 4 mmol/L (ref 3.5–5.1)
Sodium: 139 mmol/L (ref 135–145)

## 2024-11-03 LAB — GLUCOSE, CAPILLARY: Glucose-Capillary: 172 mg/dL — ABNORMAL HIGH (ref 70–99)

## 2024-11-03 LAB — CK: Total CK: 394 U/L — ABNORMAL HIGH (ref 38–234)

## 2024-11-03 NOTE — Discharge Instructions (Signed)
 Diana Mathews

## 2024-11-03 NOTE — Care Management Important Message (Signed)
 Important Message  Patient Details  Name: Diana Mathews MRN: [id number] Date of Birth: [birth year]   Important Message Given:  N/A - LOS <3 / Initial given by admissions     Duwaine LITTIE Ada 11/03/2024, 10:24 AM

## 2024-11-03 NOTE — Discharge Summary (Signed)
 " Physician Discharge Summary   Patient: Diana Mathews MRN: [id number] DOB: 1951/08/12  Admit date:     11/01/2024  Discharge date: 11/03/2024  Discharge Physician: Deliliah Room   PCP: Zollie Lowers, MD   Recommendations at discharge:    F/u with your PCP in one week. Stay hydrated  Discharge Diagnoses: Principal Problem:   AKI (acute kidney injury)   Hospital Course:  74 y.o. female with medical history significant for metastatic breast cancer with mets to the bone on letrozole , mood disorder, type 2 diabetes, hyperlipidemia, essential hypertension, iron deficiency anemia, chronic insomnia, severe morbid obesity, CKD 4, who presents to the ER due to altered mental status, generalized weakness and a fall.   AKI on CKD 4, suspect prerenal in the setting of dehydration from poor oral intake. Back to baseline on discharge Baseline creatinine 1.8 with GFR of 29 Presented with creatinine of 3.06 with GFR of 16. Creatinine today is 1.5 Received IVF Held lasix    High anion gap metabolic acidosis secondary to acute renal insufficiency, improving Resolved with IVF  Elevated CPK, Significantly improved Received IV fluid.    Hyperkalemia Resolved, received Lokelma    Hypercalcemia in the setting of bone metastasis Monitor calcium  level Received IV fluid hydration   Metastatic breast cancer with bone mets Follows with oncology outpatient Resumed home letrozole    Anemia of chronic disease Hemoglobin , at baseline No overt bleeding reported Continue to monitor H&H   Class III Obesity,POA: BMI 41 On Tirzepatide    Mood disorder Resumed Duloxetine  60 mg PO Daily   Paroxysmal A-fib on Eliquis  Resumed home Eliquis  Resumed home Coreg  Rate is currently controlled Follow up with EP Dr. Oneil Glatter at [facility]    Type 2 diabetes with hyperglycemia  Follow-up A1c-8.3% Heart healthy, carb modified diet Resumed home regimen   Chronic HFpEF,POA: Holding lasix. F/u with  cardiologist at [facility] -No signs of exacerbation -TTE 01/2024 EF 55-60% as per last office visit note   Diabetic polyneuropathy Continue with gabapentin  100 mg PO TID   Generalized weakness/Physical deconditioning,POA: PT OT evaluation Fall precautions   Disposition: Lives at home by herself and has a cane and walker.  HHPT/OT arranged.      Consultants: None Procedures performed: None  Disposition: Home health Diet recommendation:  Cardiac and Carb modified diet DISCHARGE MEDICATION: Allergies as of 11/03/2024       Reactions   Codeine Rash        Medication List     STOP taking these medications    furosemide 40 MG tablet Commonly known as: LASIX       TAKE these medications    rOPINIRole  1 MG tablet Commonly known as: REQUIP  Take 1 tablet (1 mg total) by mouth at bedtime. The timing of this medication is very important.   apixaban  5 MG Tabs tablet Commonly known as: Eliquis  Take 1 tablet (5 mg total) by mouth 2 (two) times daily.   ARIPiprazole  5 MG tablet Commonly known as: ABILIFY  Take 1 tablet (5 mg total) by mouth daily.   atorvastatin  80 MG tablet Commonly known as: LIPITOR TAKE ONE (1) TABLET BY MOUTH EVERY DAY   baclofen 10 MG tablet Commonly known as: LIORESAL Take 10 mg by mouth 4 (four) times daily.   Blood Glucose Monitoring Suppl Devi 1 each by Does not apply route in the morning, at noon, and at bedtime. May substitute to any manufacturer covered by patient's insurance.   Accu-Chek Aviva Plus w/Device Kit Use to  check glucose before breakfast and 2 hours after supper   busPIRone  10 MG tablet Commonly known as: BUSPAR  Take 1 tablet (10 mg total) by mouth 3 (three) times daily as needed.   carvedilol  6.25 MG tablet Commonly known as: COREG  TAKE 1 TABLET BY MOUTH TWICE DAILY WITH FOOD   diltiazem  240 MG 24 hr capsule Commonly known as: CARDIZEM  CD TAKE ONE (1) CAPSULE BY MOUTH 2 TIMES DAILY   diphenhydrAMINE 25 mg  capsule Commonly known as: BENADRYL Take 25 mg by mouth at bedtime as needed.   DULoxetine  60 MG capsule Commonly known as: CYMBALTA  TAKE 1 CAPSULE BY MOUTH EVERY MORNING & AT BEDTIME   fluconazole 100 MG tablet Commonly known as: DIFLUCAN Take 100 mg by mouth daily.   gabapentin  300 MG capsule Commonly known as: NEURONTIN  Take 300 mg by mouth 3 (three) times daily.   HYDROcodone-acetaminophen  10-325 MG tablet Commonly known as: NORCO Take 1 tablet by mouth every 6 (six) hours as needed.   letrozole  2.5 MG tablet Commonly known as: FEMARA  Take 2.5 mg by mouth daily.   meloxicam 7.5 MG tablet Commonly known as: MOBIC Take 7.5 mg by mouth daily.   metFORMIN  750 MG 24 hr tablet Commonly known as: GLUCOPHAGE -XR TAKE ONE (1) TABLET BY MOUTH TWO (2) TIMES DAILY   OneTouch Verio test strip Generic drug: glucose blood CHECK MORNING, NOON, & BEDTIME Dx E11.8   palbociclib 125 MG tablet Commonly known as: IBRANCE Take by mouth.   promethazine  25 MG tablet Commonly known as: PHENERGAN  TAKE 1 TABLET BY MOUTH EVERY 6 HOURS AS NEEDED FOR NAUSEA & VOMITING   tirzepatide  7.5 MG/0.5ML Pen Commonly known as: MOUNJARO  Inject 7.5 mg into the skin once a week.   trazodone  300 MG tablet Commonly known as: DESYREL  TAKE 1/3 TO 1 TABLET AT BEDTIME AS NEEDED FOR SLEEP   TRUEplus Lancets 33G Misc Test BS QID Dx E11.8               Durable Medical Equipment  (From admission, onward)           Start     Ordered   11/02/24 1052  For home use only DME 3 n 1  Once        11/02/24 1051            Contact information for follow-up providers     Zollie Lowers, MD. Schedule an appointment as soon as possible for a visit in 1 week(s).   Specialty: Family Medicine Contact information: [address] [place] [address] [phone]              Contact information for after-discharge care     Durable Medical Equipment     [facility] [employer] (DME) .   Service: Durable Medical Equipment Contact information: [address] [place]  [phone]                    Discharge Exam: Diana Mathews   11/01/24 1242  Weight: 107.5 kg   Constitutional: NAD, calm, comfortable Eyes: PERRL, lids and conjunctivae normal ENMT: Mucous membranes are moist. Posterior pharynx clear of any exudate or lesions.Normal dentition.  Neck: normal, supple, no masses, no thyromegaly Respiratory: clear to auscultation bilaterally, no wheezing, no crackles. Normal respiratory effort. No accessory muscle use.  Cardiovascular: Regular rate and rhythm, no murmurs / rubs / gallops. No extremity edema. 2+ pedal pulses. No carotid bruits.  Abdomen: no tenderness,  no masses palpated. No hepatosplenomegaly. Bowel sounds positive.  Musculoskeletal: no clubbing / cyanosis. No joint deformity upper and lower extremities. Good ROM, no contractures. Normal muscle tone.  Skin: no rashes, lesions, ulcers. No induration Neurologic: CN 2-12 grossly intact. Sensation intact, DTR normal. Strength 5/5 x all 4 extremities.  Psychiatric: Normal judgment and insight. Alert and oriented x 3. Normal mood.    Condition at discharge: good  The results of significant diagnostics from this hospitalization (including imaging, microbiology, ancillary and laboratory) are listed below for reference.   Imaging Studies: CT Head Wo Contrast Result Date: 11/01/2024 CLINICAL DATA:  Generalized weakness. EXAM: CT HEAD WITHOUT CONTRAST TECHNIQUE: Contiguous axial images were obtained from the base of the skull through the vertex without intravenous contrast. RADIATION DOSE REDUCTION: This exam was performed according to the departmental dose-optimization program which includes automated exposure control, adjustment of the mA and/or kV according to patient size and/or use of iterative reconstruction technique. COMPARISON:  August 28, 2020 FINDINGS:  Brain: No evidence of acute infarction, hemorrhage, hydrocephalus, extra-axial collection or mass lesion/mass effect. Vascular: No hyperdense vessel or unexpected calcification. Skull: Normal. Negative for fracture or focal lesion. Sinuses/Orbits: No acute finding. Other: None. IMPRESSION: No acute intracranial pathology. Electronically Signed   By: Suzen Dials M.D.   On: 11/01/2024 18:47    Microbiology: Results for orders placed or performed during the hospital encounter of 06/12/18  MRSA PCR Screening     Status: None   Collection Time: 06/12/18  9:02 PM   Specimen: Nasal Mucosa; Nasopharyngeal  Result Value Ref Range Status   MRSA by PCR NEGATIVE NEGATIVE Final    Comment:        The GeneXpert MRSA Assay (FDA approved for NASAL specimens only), is one component of a comprehensive MRSA colonization surveillance program. It is not intended to diagnose MRSA infection nor to guide or monitor treatment for MRSA infections. Performed at [facility], [address] [place], [place] [address]     Labs: CBC: Recent Labs  Lab 11/01/24 1711 11/02/24 0425  WBC 6.9 5.3  NEUTROABS 3.2  --   HGB 11.1* 10.0*  HCT 32.9* 30.4*  MCV 100.0 100.7*  PLT 358 304   Basic Metabolic Panel: Recent Labs  Lab 11/01/24 1711 11/02/24 0425 11/03/24 0431  NA 140 142 139  K 4.7 5.3* 4.0  CL 103 108 103  CO2 17* 20* 21*  GLUCOSE 152* 125* 110*  BUN 34* 30* 18  CREATININE 3.06* 2.54* 1.53*  CALCIUM  10.6* 9.7 9.2  MG  --  2.2  --   PHOS  --  4.5  --    Liver Function Tests: No results for input(s): AST, ALT, ALKPHOS, BILITOT, PROT, ALBUMIN in the last 168 hours. CBG: Recent Labs  Lab 11/02/24 0010 11/02/24 0731 11/02/24 1116 11/02/24 1615 11/02/24 2218  GLUCAP 146* 120* 151* 124* 127*    Discharge time spent: 40 minutes.  Signed: Deliliah Room, MD [facility] 11/03/2024 "

## 2024-11-03 NOTE — Plan of Care (Signed)
   Problem: Activity: Goal: Risk for activity intolerance will decrease Outcome: Progressing   Problem: Nutrition: Goal: Adequate nutrition will be maintained Outcome: Progressing   Problem: Coping: Goal: Level of anxiety will decrease Outcome: Progressing

## 2024-11-03 NOTE — Progress Notes (Signed)
 The beneficiary is discharging home and will be confined to a single room, in order to safely discharge back home beneficiary will need a 3N1.

## 2024-11-03 NOTE — TOC Transition Note (Signed)
 Transition of Care [facility]) - Discharge Note   Patient Details  Name: Diana Mathews MRN: [id number] Date of Birth: April 30, 1951  [facility]) CM/SW Contact:  Lucie Lunger, LCSWA Phone Number: 11/03/2024, 10:15 AM  Clinical Narrative:    CSW updated that plan is for D/C home today. CSW updated Darleene with [facility] that [facility] referral was confirmed as accepted, [facility] will follow pt in the home for services. CSW spoke to Zack with [employer] to give 3N1 referral, orders in and it will deliver to room. TOC signing off.   Final next level of care: Home w Home Health Services Barriers to Discharge: Barriers Resolved   Patient Goals and CMS Choice Patient states their goals for this hospitalization and ongoing recovery are:: return home CMS Medicare.gov Compare Post Acute Care list provided to:: Patient Choice offered to / list presented to : Patient      Discharge Placement                       Discharge Plan and Services Additional resources added to the After Visit Summary for                  DME Arranged: 3-N-1 DME Agency: [facility] Date DME Agency Contacted: 11/03/24   Representative spoke with at DME Agency: Zack HH Arranged: PT, OT HH Agency: [facility] Date [facility] Agency Contacted: 11/03/24   Representative spoke with at [facility] Agency: Darleene  Social Drivers of Health (SDOH) Interventions SDOH Screenings   Food Insecurity: No Food Insecurity (11/01/2024)  Recent Concern: Food Insecurity - Food Insecurity Present (10/05/2024)  Housing: Low Risk (11/01/2024)  Transportation Needs: No Transportation Needs (11/01/2024)  Recent Concern: Transportation Needs - Unmet Transportation Needs (10/05/2024)  Utilities: Not At Risk (11/01/2024)  Alcohol  Screen: Low Risk (07/04/2024)  Depression (PHQ2-9): Medium Risk (10/06/2024)  Financial Resource Strain: Low Risk (10/05/2024)  Physical Activity: Sufficiently Active (10/05/2024)  Social Connections: Socially Isolated  (11/01/2024)  Stress: Stress Concern Present (10/05/2024)  Tobacco Use: Medium Risk (11/01/2024)  Health Literacy: Adequate Health Literacy (07/04/2024)     Readmission Risk Interventions    11/03/2024   10:11 AM  Readmission Risk Prevention Plan  Medication Screening Complete  Transportation Screening Complete

## 2024-11-04 ENCOUNTER — Telehealth: Payer: Self-pay

## 2024-11-04 NOTE — Transitions of Care (Post Inpatient/ED Visit) (Signed)
" ° °  11/04/2024  Name: Diana Mathews MRN: [id number] DOB: August 26, 1951  Today's TOC FU Call Status: Today's TOC FU Call Status:: Unsuccessful Call (1st Attempt) Unsuccessful Call (1st Attempt) Date: 11/04/24  Attempted to reach the patient regarding the most recent Inpatient/ED visit. Left a HIPAA approved voicemail message to phone number provided in demographics per DPR.    Follow Up Plan: Additional outreach attempts will be made to reach the patient to complete the Transitions of Care (Post Inpatient/ED visit) call.   Richerd Fish, RN, BSN, CCM [facility], [facility] Management Coordinator Direct Dial: [phone]        "

## 2024-11-04 NOTE — Transitions of Care (Post Inpatient/ED Visit) (Signed)
" ° °  11/04/2024  Name: Diana Mathews MRN: [id number] DOB: Jun 28, 1951  Today's TOC FU Call Status:  Patient left a voicemail for return call.  Today's TOC FU Call Status:: Unsuccessful Call (2nd Attempt) Unsuccessful Call (2nd Attempt) Date: 11/04/24  Attempted to reach the patient regarding the most recent Inpatient/ED visit. Left a HIPAA approved voicemail message to phone number provided in demographics per DPR.    Follow Up Plan: Additional outreach attempts will be made to reach the patient to complete the Transitions of Care (Post Inpatient/ED visit) call.   Richerd Fish, RN, BSN, CCM [facility], [facility] Management Coordinator Direct Dial: [phone]        "

## 2024-11-08 ENCOUNTER — Telehealth: Payer: Self-pay

## 2024-11-08 NOTE — Transitions of Care (Post Inpatient/ED Visit) (Signed)
" ° °  11/08/2024  Name: WILBUR LABUDA MRN: [id number] DOB: [birth year]  Today's TOC FU Call Status: Today's TOC FU Call Status:: Unsuccessful Call (3rd Attempt) Unsuccessful Call (3rd Attempt) Date: 11/08/24  Attempted to reach the patient regarding the most recent Inpatient/ED visit.  Left a HIPAA approved voicemail message to phone number provided in demographics per DPR.    Follow Up Plan: No further outreach attempts will be made at this time. We have been unable to contact the patient. Richerd Fish, RN, BSN, CCM [facility], [facility] Management Coordinator Direct Dial: [phone]        "

## 2024-11-09 ENCOUNTER — Ambulatory Visit: Payer: Self-pay

## 2024-11-09 ENCOUNTER — Telehealth: Payer: Self-pay

## 2024-11-09 DIAGNOSIS — N179 Acute kidney failure, unspecified: Secondary | ICD-10-CM

## 2024-11-09 NOTE — Telephone Encounter (Signed)
Noted  -LS

## 2024-11-09 NOTE — Telephone Encounter (Signed)
 FYI Only or Action Required?: FYI only for provider: ED advised.  Patient was last seen in primary care on 10/06/2024 by Zollie Lowers, MD.  Called Nurse Triage reporting Leg Swelling.  Symptoms began a week ago.  Interventions attempted: Nothing.  Symptoms are: unchanged.  Triage Disposition: Go to ED Now (Notify PCP)  Patient/caregiver understands and will follow disposition?: No, wishes to speak with PCP   Copied from CRM #[id number]. Topic: Clinical - Red Word Triage >> Nov 09, 2024  8:49 AM Myrick T wrote: Red Word that prompted transfer to Nurse Triage: patients says she needs to reschedule her appt due to swollen knees to point she can't walk. She says her left is swollen more than the right. Its been happening since she fell in Dec Reason for Disposition  [1] Can't walk or can barely walk AND [2] new-onset  Answer Assessment - Initial Assessment Questions Advised ED now, patient reports will wait and tell her daughter to take to [facility] ED.  Pt r/s hospital f/u appt until 11/17/24; advised ED today and not to wait until f/u appt. Patient verbalized understanding.  Advised call back or ED/911 if symptoms occur/worsen: severe diff breathing, chest pain > 5 min, faint. Patient verbalized understanding.    1. ONSET: When did the swelling start? (e.g., minutes, hours, days)     After 10/04/24 s/p fall, swelling started 2. LOCATION: What part of the leg is swollen?  Are both legs swollen or just one leg?     bilateral legs, left more than right; start knees to feet 3. SEVERITY: How bad is the swelling? (e.g., localized; mild, moderate, severe)     Moderate swelling 4. REDNESS: Is there redness or signs of infection?     denies 5. PAIN: Is the swelling painful to touch? If Yes, ask: How painful is it?   (Scale 1-10; mild, moderate or severe) 7/10 pain, knees; Ibuprofen, last taken last night 6. FEVER: Do you have a fever? If Yes, ask: What is it, how was it  measured, and when did it start?      Denies fever chills, n/v 7. CAUSE: What do you think is causing the leg swelling?     unsure 8. MEDICAL HISTORY: Do you have a history of blood clots (e.g., DVT), cancer, heart failure, kidney disease, or liver failure?     no 10. OTHER SYMPTOMS: Do you have any other symptoms? (e.g., chest pain, difficulty breathing)       Denies diff breathing, chest pain, faint, fever, chills, n/v  Protocols used: Leg Swelling and Edema-A-AH

## 2024-11-10 ENCOUNTER — Telehealth: Payer: Self-pay

## 2024-11-10 ENCOUNTER — Inpatient Hospital Stay: Admitting: Family Medicine

## 2024-11-10 NOTE — Progress Notes (Signed)
 Complex Care Management Note  Care Guide Note 11/10/2024 Name: GEETA DWORKIN MRN: [id number] DOB: [birth year]  JYRA LAGARES is a 74 y.o. year old female who sees Stacks, Butler, MD for primary care. I reached out to Apolinar KANDICE Groves by phone today to offer complex care management services.  Ms. Ammirati was given information about Complex Care Management services today including:   The Complex Care Management services include support from the care team which includes your Nurse Care Manager, Clinical Social Worker, or Pharmacist.  The Complex Care Management team is here to help remove barriers to the health concerns and goals most important to you. Complex Care Management services are voluntary, and the patient may decline or stop services at any time by request to their care team member.   Complex Care Management Consent Status: Patient did not agree to participate in complex care management services at this time.  Follow up plan:    Encounter Outcome:  Patient Refused  Jeoffrey Buffalo , RMA     [facility]  [facility], [facility] Guide  Direct Dial: [phone]  Website: [email or web address]

## 2024-11-14 ENCOUNTER — Encounter (HOSPITAL_COMMUNITY): Payer: Self-pay | Admitting: Emergency Medicine

## 2024-11-14 ENCOUNTER — Other Ambulatory Visit: Payer: Self-pay

## 2024-11-14 ENCOUNTER — Inpatient Hospital Stay (HOSPITAL_COMMUNITY)
Admission: EM | Admit: 2024-11-14 | Discharge: 2024-11-25 | DRG: 193 | Disposition: A | Discharge status: Skilled Nursing Facility | Attending: Hospitalist | Admitting: Hospitalist

## 2024-11-14 ENCOUNTER — Emergency Department (HOSPITAL_COMMUNITY)

## 2024-11-14 DIAGNOSIS — Z79811 Long term (current) use of aromatase inhibitors: Secondary | ICD-10-CM

## 2024-11-14 DIAGNOSIS — Z8261 Family history of arthritis: Secondary | ICD-10-CM

## 2024-11-14 DIAGNOSIS — E1122 Type 2 diabetes mellitus with diabetic chronic kidney disease: Secondary | ICD-10-CM | POA: Diagnosis present

## 2024-11-14 DIAGNOSIS — Z87891 Personal history of nicotine dependence: Secondary | ICD-10-CM

## 2024-11-14 DIAGNOSIS — J9602 Acute respiratory failure with hypercapnia: Principal | ICD-10-CM | POA: Diagnosis present

## 2024-11-14 DIAGNOSIS — E119 Type 2 diabetes mellitus without complications: Secondary | ICD-10-CM

## 2024-11-14 DIAGNOSIS — F32A Depression, unspecified: Secondary | ICD-10-CM | POA: Diagnosis present

## 2024-11-14 DIAGNOSIS — Z90710 Acquired absence of both cervix and uterus: Secondary | ICD-10-CM

## 2024-11-14 DIAGNOSIS — N184 Chronic kidney disease, stage 4 (severe): Secondary | ICD-10-CM | POA: Diagnosis present

## 2024-11-14 DIAGNOSIS — N179 Acute kidney failure, unspecified: Secondary | ICD-10-CM | POA: Diagnosis present

## 2024-11-14 DIAGNOSIS — N183 Chronic kidney disease, stage 3 unspecified: Secondary | ICD-10-CM | POA: Diagnosis not present

## 2024-11-14 DIAGNOSIS — L8990 Pressure ulcer of unspecified site, unspecified stage: Secondary | ICD-10-CM | POA: Diagnosis present

## 2024-11-14 DIAGNOSIS — F39 Unspecified mood [affective] disorder: Secondary | ICD-10-CM | POA: Diagnosis present

## 2024-11-14 DIAGNOSIS — Z794 Long term (current) use of insulin: Secondary | ICD-10-CM

## 2024-11-14 DIAGNOSIS — E872 Acidosis, unspecified: Secondary | ICD-10-CM | POA: Diagnosis present

## 2024-11-14 DIAGNOSIS — Z7901 Long term (current) use of anticoagulants: Secondary | ICD-10-CM | POA: Diagnosis not present

## 2024-11-14 DIAGNOSIS — E870 Hyperosmolality and hypernatremia: Secondary | ICD-10-CM | POA: Diagnosis present

## 2024-11-14 DIAGNOSIS — Z1152 Encounter for screening for COVID-19: Secondary | ICD-10-CM | POA: Diagnosis not present

## 2024-11-14 DIAGNOSIS — Z8673 Personal history of transient ischemic attack (TIA), and cerebral infarction without residual deficits: Secondary | ICD-10-CM

## 2024-11-14 DIAGNOSIS — Z823 Family history of stroke: Secondary | ICD-10-CM

## 2024-11-14 DIAGNOSIS — L89312 Pressure ulcer of right buttock, stage 2: Secondary | ICD-10-CM | POA: Diagnosis present

## 2024-11-14 DIAGNOSIS — Z885 Allergy status to narcotic agent status: Secondary | ICD-10-CM

## 2024-11-14 DIAGNOSIS — I4719 Other supraventricular tachycardia: Secondary | ICD-10-CM | POA: Diagnosis present

## 2024-11-14 DIAGNOSIS — R4182 Altered mental status, unspecified: Secondary | ICD-10-CM

## 2024-11-14 DIAGNOSIS — Z7985 Long-term (current) use of injectable non-insulin antidiabetic drugs: Secondary | ICD-10-CM

## 2024-11-14 DIAGNOSIS — I4891 Unspecified atrial fibrillation: Secondary | ICD-10-CM | POA: Diagnosis not present

## 2024-11-14 DIAGNOSIS — J189 Pneumonia, unspecified organism: Secondary | ICD-10-CM | POA: Diagnosis present

## 2024-11-14 DIAGNOSIS — N390 Urinary tract infection, site not specified: Secondary | ICD-10-CM | POA: Diagnosis present

## 2024-11-14 DIAGNOSIS — I130 Hypertensive heart and chronic kidney disease with heart failure and stage 1 through stage 4 chronic kidney disease, or unspecified chronic kidney disease: Secondary | ICD-10-CM | POA: Diagnosis present

## 2024-11-14 DIAGNOSIS — I480 Paroxysmal atrial fibrillation: Secondary | ICD-10-CM | POA: Diagnosis present

## 2024-11-14 DIAGNOSIS — Z825 Family history of asthma and other chronic lower respiratory diseases: Secondary | ICD-10-CM

## 2024-11-14 DIAGNOSIS — Z6841 Body Mass Index (BMI) 40.0 and over, adult: Secondary | ICD-10-CM | POA: Diagnosis not present

## 2024-11-14 DIAGNOSIS — E871 Hypo-osmolality and hyponatremia: Secondary | ICD-10-CM | POA: Diagnosis present

## 2024-11-14 DIAGNOSIS — Z818 Family history of other mental and behavioral disorders: Secondary | ICD-10-CM

## 2024-11-14 DIAGNOSIS — D631 Anemia in chronic kidney disease: Secondary | ICD-10-CM | POA: Diagnosis present

## 2024-11-14 DIAGNOSIS — F419 Anxiety disorder, unspecified: Secondary | ICD-10-CM | POA: Diagnosis present

## 2024-11-14 DIAGNOSIS — I10 Essential (primary) hypertension: Secondary | ICD-10-CM | POA: Diagnosis not present

## 2024-11-14 DIAGNOSIS — J9601 Acute respiratory failure with hypoxia: Secondary | ICD-10-CM | POA: Diagnosis present

## 2024-11-14 DIAGNOSIS — E785 Hyperlipidemia, unspecified: Secondary | ICD-10-CM | POA: Diagnosis present

## 2024-11-14 DIAGNOSIS — I5030 Unspecified diastolic (congestive) heart failure: Secondary | ICD-10-CM | POA: Diagnosis not present

## 2024-11-14 DIAGNOSIS — I5032 Chronic diastolic (congestive) heart failure: Secondary | ICD-10-CM | POA: Diagnosis present

## 2024-11-14 DIAGNOSIS — Z833 Family history of diabetes mellitus: Secondary | ICD-10-CM

## 2024-11-14 DIAGNOSIS — Z66 Do not resuscitate: Secondary | ICD-10-CM | POA: Diagnosis present

## 2024-11-14 DIAGNOSIS — J188 Other pneumonia, unspecified organism: Secondary | ICD-10-CM

## 2024-11-14 DIAGNOSIS — E1165 Type 2 diabetes mellitus with hyperglycemia: Secondary | ICD-10-CM | POA: Diagnosis present

## 2024-11-14 DIAGNOSIS — I4811 Longstanding persistent atrial fibrillation: Secondary | ICD-10-CM | POA: Diagnosis not present

## 2024-11-14 DIAGNOSIS — E875 Hyperkalemia: Secondary | ICD-10-CM | POA: Diagnosis present

## 2024-11-14 DIAGNOSIS — R339 Retention of urine, unspecified: Secondary | ICD-10-CM | POA: Diagnosis present

## 2024-11-14 DIAGNOSIS — Z79899 Other long term (current) drug therapy: Secondary | ICD-10-CM

## 2024-11-14 DIAGNOSIS — D0511 Intraductal carcinoma in situ of right breast: Secondary | ICD-10-CM | POA: Diagnosis present

## 2024-11-14 DIAGNOSIS — Z8249 Family history of ischemic heart disease and other diseases of the circulatory system: Secondary | ICD-10-CM

## 2024-11-14 DIAGNOSIS — Z7984 Long term (current) use of oral hypoglycemic drugs: Secondary | ICD-10-CM

## 2024-11-14 DIAGNOSIS — E66813 Obesity, class 3: Secondary | ICD-10-CM | POA: Diagnosis present

## 2024-11-14 DIAGNOSIS — D630 Anemia in neoplastic disease: Secondary | ICD-10-CM | POA: Diagnosis present

## 2024-11-14 DIAGNOSIS — C7951 Secondary malignant neoplasm of bone: Secondary | ICD-10-CM | POA: Diagnosis present

## 2024-11-14 HISTORY — DX: Other supraventricular tachycardia: I47.19

## 2024-11-14 HISTORY — DX: Unspecified diastolic (congestive) heart failure: I50.30

## 2024-11-14 LAB — BASIC METABOLIC PANEL WITH GFR
Anion gap: 15 (ref 5–15)
BUN: 30 mg/dL — ABNORMAL HIGH (ref 8–23)
CO2: 17 mmol/L — ABNORMAL LOW (ref 22–32)
Calcium: 7.5 mg/dL — ABNORMAL LOW (ref 8.9–10.3)
Chloride: 87 mmol/L — ABNORMAL LOW (ref 98–111)
Creatinine, Ser: 2.39 mg/dL — ABNORMAL HIGH (ref 0.44–1.00)
GFR, Estimated: 21 mL/min — ABNORMAL LOW
Glucose, Bld: 286 mg/dL — ABNORMAL HIGH (ref 70–99)
Potassium: 6 mmol/L — ABNORMAL HIGH (ref 3.5–5.1)
Sodium: 118 mmol/L — CL (ref 135–145)

## 2024-11-14 LAB — CBC WITH DIFFERENTIAL/PLATELET
Band Neutrophils: 4 %
Basophils Absolute: 0 K/uL (ref 0.0–0.1)
Basophils Relative: 0 %
Eosinophils Absolute: 0 K/uL (ref 0.0–0.5)
Eosinophils Relative: 0 %
HCT: 26.9 % — ABNORMAL LOW (ref 36.0–46.0)
Hemoglobin: 9.4 g/dL — ABNORMAL LOW (ref 12.0–15.0)
Lymphocytes Relative: 19 %
Lymphs Abs: 1.1 K/uL (ref 0.7–4.0)
MCH: 33.2 pg (ref 26.0–34.0)
MCHC: 34.9 g/dL (ref 30.0–36.0)
MCV: 95.1 fL (ref 80.0–100.0)
Monocytes Absolute: 0.1 K/uL (ref 0.1–1.0)
Monocytes Relative: 2 %
Neutro Abs: 4.7 K/uL (ref 1.7–7.7)
Neutrophils Relative %: 75 %
Platelets: 223 K/uL (ref 150–400)
RBC: 2.83 MIL/uL — ABNORMAL LOW (ref 3.87–5.11)
RDW: 14.6 % (ref 11.5–15.5)
WBC: 5.9 K/uL (ref 4.0–10.5)
nRBC: 0 % (ref 0.0–0.2)

## 2024-11-14 LAB — CBG MONITORING, ED
Glucose-Capillary: 296 mg/dL — ABNORMAL HIGH (ref 70–99)
Glucose-Capillary: 331 mg/dL — ABNORMAL HIGH (ref 70–99)

## 2024-11-14 LAB — BLOOD GAS, ARTERIAL
Acid-base deficit: 5.2 mmol/L — ABNORMAL HIGH (ref 0.0–2.0)
Bicarbonate: 21.6 mmol/L (ref 20.0–28.0)
Drawn by: 28340
O2 Saturation: 94.4 %
Patient temperature: 37
pCO2 arterial: 46 mmHg (ref 32–48)
pH, Arterial: 7.28 — ABNORMAL LOW (ref 7.35–7.45)
pO2, Arterial: 67 mmHg — ABNORMAL LOW (ref 83–108)

## 2024-11-14 LAB — PRO BRAIN NATRIURETIC PEPTIDE: Pro Brain Natriuretic Peptide: 814 pg/mL — ABNORMAL HIGH

## 2024-11-14 LAB — RESP PANEL BY RT-PCR (RSV, FLU A&B, COVID)  RVPGX2
Influenza A by PCR: NEGATIVE
Influenza B by PCR: NEGATIVE
Resp Syncytial Virus by PCR: NEGATIVE
SARS Coronavirus 2 by RT PCR: NEGATIVE

## 2024-11-14 LAB — TROPONIN T, HIGH SENSITIVITY: Troponin T High Sensitivity: 25 ng/L — ABNORMAL HIGH (ref 0–19)

## 2024-11-14 MED ORDER — DEXTROSE 50 % IV SOLN
1.0000 | Freq: Once | INTRAVENOUS | Status: AC
  Administered 2024-11-14: 50 mL via INTRAVENOUS
  Filled 2024-11-14: qty 50

## 2024-11-14 MED ORDER — PROCHLORPERAZINE EDISYLATE 10 MG/2ML IJ SOLN
5.0000 mg | Freq: Four times a day (QID) | INTRAMUSCULAR | Status: DC | PRN
  Administered 2024-11-22: 5 mg via INTRAVENOUS
  Filled 2024-11-14: qty 2

## 2024-11-14 MED ORDER — SODIUM BICARBONATE 8.4 % IV SOLN
50.0000 meq | Freq: Once | INTRAVENOUS | Status: AC
  Administered 2024-11-14: 50 meq via INTRAVENOUS
  Filled 2024-11-14: qty 50

## 2024-11-14 MED ORDER — LETROZOLE 2.5 MG PO TABS
2.5000 mg | ORAL_TABLET | Freq: Every day | ORAL | Status: DC
  Administered 2024-11-15 – 2024-11-25 (×11): 2.5 mg via ORAL
  Filled 2024-11-14 (×14): qty 1

## 2024-11-14 MED ORDER — SODIUM CHLORIDE 0.9 % IV BOLUS
250.0000 mL | Freq: Once | INTRAVENOUS | Status: AC
  Administered 2024-11-14: 250 mL via INTRAVENOUS

## 2024-11-14 MED ORDER — CHLORHEXIDINE GLUCONATE CLOTH 2 % EX PADS
6.0000 | MEDICATED_PAD | Freq: Every day | CUTANEOUS | Status: DC
  Administered 2024-11-15 – 2024-11-24 (×10): 6 via TOPICAL

## 2024-11-14 MED ORDER — CALCIUM GLUCONATE-NACL 1-0.675 GM/50ML-% IV SOLN
1.0000 g | Freq: Once | INTRAVENOUS | Status: AC
  Administered 2024-11-14: 1000 mg via INTRAVENOUS
  Filled 2024-11-14: qty 50

## 2024-11-14 MED ORDER — POLYETHYLENE GLYCOL 3350 17 G PO PACK: 17.0000 g | PACK | Freq: Every day | ORAL | Status: DC | PRN

## 2024-11-14 MED ORDER — INSULIN ASPART 100 UNIT/ML IV SOLN
10.0000 [IU] | Freq: Once | INTRAVENOUS | Status: AC
  Administered 2024-11-14: 10 [IU] via INTRAVENOUS
  Filled 2024-11-14: qty 1

## 2024-11-14 MED ORDER — IPRATROPIUM-ALBUTEROL 0.5-2.5 (3) MG/3ML IN SOLN
3.0000 mL | Freq: Once | RESPIRATORY_TRACT | Status: AC
  Administered 2024-11-14: 3 mL via RESPIRATORY_TRACT
  Filled 2024-11-14: qty 3

## 2024-11-14 MED ORDER — FUROSEMIDE 10 MG/ML IJ SOLN
40.0000 mg | Freq: Once | INTRAMUSCULAR | Status: AC
  Administered 2024-11-14: 40 mg via INTRAVENOUS
  Filled 2024-11-14: qty 4

## 2024-11-14 MED ORDER — SODIUM ZIRCONIUM CYCLOSILICATE 10 G PO PACK
10.0000 g | PACK | Freq: Once | ORAL | Status: DC
  Filled 2024-11-14: qty 2

## 2024-11-14 MED ORDER — MELATONIN 3 MG PO TABS
6.0000 mg | ORAL_TABLET | Freq: Every evening | ORAL | Status: DC | PRN
  Administered 2024-11-17 – 2024-11-19 (×4): 6 mg via ORAL
  Filled 2024-11-14 (×4): qty 2

## 2024-11-14 MED ORDER — SODIUM CHLORIDE 0.9 % IV SOLN
100.0000 mg | Freq: Once | INTRAVENOUS | Status: AC
  Administered 2024-11-15: 100 mg via INTRAVENOUS
  Filled 2024-11-14 (×2): qty 100

## 2024-11-14 MED ORDER — SODIUM CHLORIDE 0.9 % IV SOLN
1.0000 g | Freq: Once | INTRAVENOUS | Status: AC
  Administered 2024-11-14: 1 g via INTRAVENOUS
  Filled 2024-11-14: qty 10

## 2024-11-14 MED ORDER — APIXABAN 5 MG PO TABS
5.0000 mg | ORAL_TABLET | Freq: Two times a day (BID) | ORAL | Status: DC
  Administered 2024-11-15 – 2024-11-25 (×21): 5 mg via ORAL
  Filled 2024-11-14 (×21): qty 1

## 2024-11-14 NOTE — ED Notes (Addendum)
 ED TO INPATIENT HANDOFF REPORT  ED Nurse Name and Phone #:   S Name/Age/Gender Diana Mathews 74 y.o. female Room/Bed: APA14/APA14  Code Status   Code Status: Limited: Do not attempt resuscitation (DNR) -DNR-LIMITED -Do Not Intubate/DNI   Home/SNF/Other Home Patient oriented to: self, pt confused, poor historian Is this baseline? Yes   Triage Complete: Triage complete  Chief Complaint Acute hypoxic respiratory failure (HCC) [J96.01]  Triage Note Pt's son called ems due to pt not breathing right. He states he went to store and when he came back home her breathing had changed. Ems states pt was 82% on room air, so they placed her on NRB.    Allergies Allergies[1]  Level of Care/Admitting Diagnosis ED Disposition     ED Disposition  Admit   Condition  --   Comment  Hospital Area: [facility] [[id number]]  Level of Care: Stepdown [14]  Diagnosis: Acute hypoxic respiratory failure [facility]) [[id number]]  Admitting Physician: SHONA TERRY SAILOR [[id number]]  Attending Physician: SHONA TERRY SAILOR [[id number]]  Certification:: I certify this patient will need inpatient services for at least 2 midnights  Expected Medical Readiness: 11/16/2024          B Medical/Surgery History Past Medical History:  Diagnosis Date   Atrial fibrillation (HCC) 06/12/2018   Depression    Essential hypertension    Headache    Hyperlipidemia    Metastatic breast cancer    Left breast, metastatic to bone and with pulmonary nodules - [facility]   Osteoarthritis    Sleep apnea    TIA (transient ischemic attack)    Type 2 diabetes mellitus (HCC)    Past Surgical History:  Procedure Laterality Date   ABDOMINAL HYSTERECTOMY     CHOLECYSTECTOMY     FOOT SURGERY Left    HERNIA REPAIR     TUBAL LIGATION     WRIST SURGERY Bilateral    Carpal Tunnel     A IV Location/Drains/Wounds Patient Lines/Drains/Airways Status     Active Line/Drains/Airways     Name Placement date Placement time  Site Days   Peripheral IV 11/14/24 20 G 1 Right Antecubital 11/14/24  2042  Antecubital  less than 1   Wound 11/02/24 0017 Pressure Injury Thigh Anterior;Left;Proximal Stage 2 -  Partial thickness loss of dermis presenting as a shallow open injury with a red, pink wound bed without slough. 11/02/24  0017  Thigh  12   Wound 11/02/24 0018 Other (Comment) Back Right;Upper 11/02/24  0018  Back  12   Wound 11/02/24 0019 Other (Comment) Back Right;Upper 11/02/24  0019  Back  12   Wound 11/02/24 0019 Other (Comment) Shoulder Posterior;Right 11/02/24  0019  Shoulder  12   Wound 11/02/24 0020 Other (Comment) Sternum Lower 11/02/24  0020  Sternum  12   Wound 11/02/24 0020 Other (Comment) Abdomen Left;Lower 11/02/24  0020  Abdomen  12   Wound 11/02/24 0020 Other (Comment) Neck Right 11/02/24  0020  Neck  12   Wound 11/02/24 0021 Other (Comment) Face Upper 11/02/24  0021  Face  12            Intake/Output Last 24 hours No intake or output data in the 24 hours ending 11/14/24 2320  Labs/Imaging Results for orders placed or performed during the hospital encounter of 11/14/24 (from the past 48 hours)  Resp panel by RT-PCR (RSV, Flu A&B, Covid) Anterior Nasal Swab     Status: None   Collection Time: 11/14/24  8:39  PM   Specimen: Anterior Nasal Swab  Result Value Ref Range   SARS Coronavirus 2 by RT PCR NEGATIVE NEGATIVE    Comment: (NOTE) SARS-CoV-2 target nucleic acids are NOT DETECTED.  The SARS-CoV-2 RNA is generally detectable in upper respiratory specimens during the acute phase of infection. The lowest concentration of SARS-CoV-2 viral copies this assay can detect is 138 copies/mL. A negative result does not preclude SARS-Cov-2 infection and should not be used as the sole basis for treatment or other patient management decisions. A negative result may occur with  improper specimen collection/handling, submission of specimen other than nasopharyngeal swab, presence of viral mutation(s)  within the areas targeted by this assay, and inadequate number of viral copies(<138 copies/mL). A negative result must be combined with clinical observations, patient history, and epidemiological information. The expected result is Negative.  Fact Sheet for Patients:  bloggercourse.com  Fact Sheet for Healthcare Providers:  seriousbroker.it  This test is no t yet approved or cleared by the [place]  FDA and  has been authorized for detection and/or diagnosis of SARS-CoV-2 by FDA under an Emergency Use Authorization (EUA). This EUA will remain  in effect (meaning this test can be used) for the duration of the COVID-19 declaration under Section 564(b)(1) of the Act, 21 U.S.C.section 360bbb-3(b)(1), unless the authorization is terminated  or revoked sooner.       Influenza A by PCR NEGATIVE NEGATIVE   Influenza B by PCR NEGATIVE NEGATIVE    Comment: (NOTE) The Xpert Xpress SARS-CoV-2/FLU/RSV plus assay is intended as an aid in the diagnosis of influenza from Nasopharyngeal swab specimens and should not be used as a sole basis for treatment. Nasal washings and aspirates are unacceptable for Xpert Xpress SARS-CoV-2/FLU/RSV testing.  Fact Sheet for Patients: bloggercourse.com  Fact Sheet for Healthcare Providers: seriousbroker.it  This test is not yet approved or cleared by the [place]  FDA and has been authorized for detection and/or diagnosis of SARS-CoV-2 by FDA under an Emergency Use Authorization (EUA). This EUA will remain in effect (meaning this test can be used) for the duration of the COVID-19 declaration under Section 564(b)(1) of the Act, 21 U.S.C. section 360bbb-3(b)(1), unless the authorization is terminated or revoked.     Resp Syncytial Virus by PCR NEGATIVE NEGATIVE    Comment: (NOTE) Fact Sheet for  Patients: bloggercourse.com  Fact Sheet for Healthcare Providers: seriousbroker.it  This test is not yet approved or cleared by the [place]  FDA and has been authorized for detection and/or diagnosis of SARS-CoV-2 by FDA under an Emergency Use Authorization (EUA). This EUA will remain in effect (meaning this test can be used) for the duration of the COVID-19 declaration under Section 564(b)(1) of the Act, 21 U.S.C. section 360bbb-3(b)(1), unless the authorization is terminated or revoked.  Performed at [facility], [address] [place], [place] [address]   Pro Brain natriuretic peptide     Status: Abnormal   Collection Time: 11/14/24  8:39 PM  Result Value Ref Range   Pro Brain Natriuretic Peptide 814.0 (H) <300.0 pg/mL    Comment: (NOTE) Age Group        Cut-Points    Interpretation  < 50 years     450 pg/mL       NT-proBNP > 450 pg/mL indicates                                ADHF is likely  50 to 75 years  900 pg/mL      NT-proBNP > 900 pg/mL indicates          ADHF is likely  > 75 years      1800 pg/mL     NT-proBNP > 1800 pg/mL indicates          ADHF is likely                           All ages    Results between       Indeterminate. Further clinical             300 and the cut-   information is needed to determine            point for age group   if ADHF is present.                                                             Elecsys proBNP II/ Elecsys proBNP II STAT           Cut-Point                       Interpretation  300 pg/mL                    NT-proBNP <300pg/mL indicates                             ADHF is not likely  Performed at [facility], [address] [place], [place] [address]   Troponin T, High Sensitivity     Status: Abnormal   Collection Time: 11/14/24  8:39 PM  Result Value Ref Range   Troponin T High Sensitivity 25 (H) 0 - 19 ng/L    Comment: (NOTE) Biotin  concentrations > 1000 ng/mL falsely decrease TnT results.  Serial cardiac troponin measurements are suggested.  Refer to the Links section for chest pain algorithms and additional  guidance. Performed at [facility], [address], [place], [place] [address]   CBC with Differential     Status: Abnormal   Collection Time: 11/14/24  8:39 PM  Result Value Ref Range   WBC 5.9 4.0 - 10.5 K/uL   RBC 2.83 (L) 3.87 - 5.11 MIL/uL   Hemoglobin 9.4 (L) 12.0 - 15.0 g/dL   HCT 73.0 (L) 63.9 - 53.9 %   MCV 95.1 80.0 - 100.0 fL   MCH 33.2 26.0 - 34.0 pg   MCHC 34.9 30.0 - 36.0 g/dL   RDW 85.3 88.4 - 84.4 %   Platelets 223 150 - 400 K/uL   nRBC 0.0 0.0 - 0.2 %   Neutrophils Relative % 75 %   Neutro Abs 4.7 1.7 - 7.7 K/uL   Band Neutrophils 4 %   Lymphocytes Relative 19 %   Lymphs Abs 1.1 0.7 - 4.0 K/uL   Monocytes Relative 2 %   Monocytes Absolute 0.1 0.1 - 1.0 K/uL   Eosinophils Relative 0 %   Eosinophils Absolute 0.0 0.0 - 0.5 K/uL   Basophils Relative 0 %   Basophils Absolute 0.0 0.0 - 0.1 K/uL    Comment: Performed at [facility], [address], [place], [place] [address]  Basic metabolic panel     Status: Abnormal   Collection Time: 11/14/24  8:41 PM  Result Value Ref Range   Sodium 118 (LL) 135 - 145 mmol/L    Comment: Critical Value, Read Back and verified with CRAIN, L ON 11/14/24 AT 2122 BY LOY, C   Potassium 6.0 (H) 3.5 - 5.1 mmol/L   Chloride 87 (L) 98 - 111 mmol/L   CO2 17 (L) 22 - 32 mmol/L   Glucose, Bld 286 (H) 70 - 99 mg/dL    Comment: Glucose reference range applies only to samples taken after fasting for at least 8 hours.   BUN 30 (H) 8 - 23 mg/dL   Creatinine, Ser 7.60 (H) 0.44 - 1.00 mg/dL   Calcium  7.5 (L) 8.9 - 10.3 mg/dL   GFR, Estimated 21 (L) >60 mL/min    Comment: (NOTE) Calculated using the CKD-EPI Creatinine Equation (2021)    Anion gap 15 5 - 15    Comment: Performed at [facility], [address] [place], [place] [address]  CBG monitoring, ED      Status: Abnormal   Collection Time: 11/14/24  8:44 PM  Result Value Ref Range   Glucose-Capillary 296 (H) 70 - 99 mg/dL    Comment: Glucose reference range applies only to samples taken after fasting for at least 8 hours.  Blood gas, arterial     Status: Abnormal   Collection Time: 11/14/24  9:00 PM  Result Value Ref Range   pH, Arterial 7.28 (L) 7.35 - 7.45   pCO2 arterial 46 32 - 48 mmHg   pO2, Arterial 67 (L) 83 - 108 mmHg   Bicarbonate 21.6 20.0 - 28.0 mmol/L   Acid-base deficit 5.2 (H) 0.0 - 2.0 mmol/L   O2 Saturation 94.4 %   Patient temperature 37.0    Collection site RIGHT RADIAL    Drawn by [id number]    Allens test (pass/fail) PASS PASS    Comment: Performed at [facility], [address] [place], [place] [address]  CBG monitoring, ED     Status: Abnormal   Collection Time: 11/14/24 11:05 PM  Result Value Ref Range   Glucose-Capillary 331 (H) 70 - 99 mg/dL    Comment: Glucose reference range applies only to samples taken after fasting for at least 8 hours.   CT Head Wo Contrast Result Date: 11/14/2024 EXAM: CT HEAD WITHOUT CONTRAST 11/14/2024 09:31:33 PM TECHNIQUE: CT of the head was performed without the administration of intravenous contrast. Automated exposure control, iterative reconstruction, and/or weight based adjustment of the mA/kV was utilized to reduce the radiation dose to as low as reasonably achievable. COMPARISON: 11/01/2024 CLINICAL HISTORY: ams ams FINDINGS: BRAIN AND VENTRICLES: No acute hemorrhage. No evidence of acute infarct. No hydrocephalus. No extra-axial collection. No mass effect or midline shift. Patchy low density throughout the deep white matter, likely chronic small vessel disease, stable. ORBITS: No acute abnormality. SINUSES: No acute abnormality. SOFT TISSUES AND SKULL: No acute soft tissue abnormality. No skull fracture. IMPRESSION: 1. No acute intracranial abnormality. 2. Stable patchy chronic small vessel disease. Electronically signed by: Franky Crease MD 11/14/2024 09:40 PM EST RP Workstation: [id number]   CT Chest Wo Contrast Result Date: 11/14/2024 EXAM: CT CHEST WITHOUT CONTRAST 11/14/2024 09:32:16 PM TECHNIQUE: CT of the chest was performed without the administration of intravenous contrast. Multiplanar reformatted images are provided for review. Automated exposure control, iterative reconstruction, and/or weight based adjustment of the mA/kV was utilized to  reduce the radiation dose to as low as reasonably achievable. COMPARISON: CT angiogram chest 10/32/2023. No images were available for direct comparison. CLINICAL HISTORY: sob sob FINDINGS: MEDIASTINUM: The heart is mildly enlarged. Coronary and aortic atherosclerotic calcifications are noted. The central airways are clear. LYMPH NODES: No mediastinal, hilar or axillary lymphadenopathy. LUNGS AND PLEURA: Mild patchy multifocal airspace opacities are seen in the bilateral lower lobes, lingula and right middle lobe. There are also minimal patchy ground glass opacities in the inferior right upper lobe. Airspace consolidation is the most significant in the left lower lobe. No pleural effusion or pneumothorax. SOFT TISSUES/BONES: Mildly sclerotic changes are seen in the T11 vertebral body and within multiple ribs as mentioned on prior scan. UPPER ABDOMEN: Limited images of the upper abdomen demonstrate likely numerous ill-defined hypodense liver lesions measuring up to 3.6 cm, indeterminate. IMPRESSION: 1. Multifocal pneumonia within the bilateral lower lobes, lingula, right middle lobe, and inferior right upper lobe; airspace consolidation is most significant in the left lower lobe. 2. Mild cardiomegaly. 3. Numerous ill-defined hypodense liver lesions measuring up to 3.6 cm, indeterminate; recommend further evaluation with contrast-enhanced MRI or multiphase CT of the liver. 4. Indeterminate sclerotic osseous lesions involving the T11 vertebral body and multiple ribs. Electronically signed by: Greig Pique MD 11/14/2024 09:40 PM EST RP Workstation: [id number]   CT Cervical Spine Wo Contrast Result Date: 11/14/2024 EXAM: CT CERVICAL SPINE WITHOUT CONTRAST 11/14/2024 09:31:33 PM TECHNIQUE: CT of the cervical spine was performed without the administration of intravenous contrast. Multiplanar reformatted images are provided for review. Automated exposure control, iterative reconstruction, and/or weight based adjustment of the mA/kV was utilized to reduce the radiation dose to as low as reasonably achievable. COMPARISON: None available. CLINICAL HISTORY: ams, hit head, fell FINDINGS: BONES AND ALIGNMENT: Diffuse sclerosis throughout the C5 vertebral body. Sclerotic focus in the posterior elements at C7. Sclerosis within the mid left clavicle. Findings compatible with sclerotic metastases. No acute fracture or traumatic malalignment. DEGENERATIVE CHANGES: Moderate bilateral degenerative facet disease diffusely. Degenerative disc disease at C5-C6 and C6-C7. SOFT TISSUES: No prevertebral soft tissue swelling. IMPRESSION: 1. 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 Electronically signed by: Franky Crease MD 11/14/2024 09:39 PM EST RP Workstation: [id number]   DG Chest 1 View Result Date: 11/14/2024 CLINICAL DATA:  Shortness of breath. EXAM: CHEST  1 VIEW COMPARISON:  Chest radiograph dated 06/12/2018. FINDINGS: There is mild eventration of the right hemidiaphragm. No focal consolidation, pleural effusion or pneumothorax. There is mild cardiomegaly with mild central vascular congestion. No acute osseous pathology. IMPRESSION: Mild cardiomegaly with mild central vascular congestion. No focal consolidation. Electronically Signed   By: Vanetta Chou M.D.   On: 11/14/2024 20:54    Pending Labs Unresulted Labs (From admission, onward)     Start     Ordered   11/14/24 2306  MRSA Next Gen by PCR, Nasal  Once,   R         11/14/24 2305   11/14/24 2210  Basic metabolic panel  Once,   R        11/14/24 2209            Vitals/Pain Today's Vitals   11/14/24 2020 11/14/24 2022 11/14/24 2317  BP:  (!) 120/49 102/82  Pulse:  77 77  Resp:  (!) 22 11  Temp:  99.1 F (37.3 C)   TempSrc:  Axillary   SpO2:  95% 100%  Weight: 107.5  kg    Height: 5' 3 (1.6 m)    PainSc: 0-No pain      Isolation Precautions No active isolations  Medications Medications  sodium zirconium cyclosilicate  (LOKELMA ) packet 10 g (10 g Oral Not Given 11/14/24 2156)  doxycycline  (VIBRAMYCIN ) 100 mg in sodium chloride  0.9 % 250 mL IVPB (has no administration in time range)  Chlorhexidine  Gluconate Cloth 2 % PADS 6 each (has no administration in time range)  prochlorperazine  (COMPAZINE ) injection 5 mg (has no administration in time range)  melatonin tablet 6 mg (has no administration in time range)  polyethylene glycol (MIRALAX  / GLYCOLAX ) packet 17 g (has no administration in time range)  apixaban  (ELIQUIS ) tablet 5 mg (has no administration in time range)  letrozole  (FEMARA ) tablet 2.5 mg (has no administration in time range)  ipratropium-albuterol  (DUONEB) 0.5-2.5 (3) MG/3ML nebulizer solution 3 mL (3 mLs Nebulization Given 11/14/24 2056)  furosemide  (LASIX ) injection 40 mg (40 mg Intravenous Given 11/14/24 2141)  sodium bicarbonate  injection 50 mEq (50 mEq Intravenous Given 11/14/24 2145)  calcium  gluconate 1 g/ 50 mL sodium chloride  IVPB (0 mg Intravenous Stopped 11/14/24 2250)  sodium chloride  0.9 % bolus 250 mL (0 mLs Intravenous Stopped 11/14/24 2319)  cefTRIAXone  (ROCEPHIN ) 1 g in sodium chloride  0.9 % 100 mL IVPB (0 g Intravenous Stopped 11/14/24 2313)  dextrose  50 % solution 50 mL (50 mLs Intravenous Given 11/14/24 2235)  insulin  aspart (novoLOG ) injection 10 Units (10 Units Intravenous Given 11/14/24 2234)    Mobility walks with device     Focused Assessments    R Recommendations: See Admitting Provider  Note  Report given to:   Additional Notes:       [1]  Allergies Allergen Reactions   Codeine Rash

## 2024-11-14 NOTE — ED Triage Notes (Signed)
 Pt's son called ems due to pt not breathing right. He states he went to store and when he came back home her breathing had changed. Ems states pt was 82% on room air, so they placed her on NRB.

## 2024-11-14 NOTE — ED Provider Notes (Signed)
 " [facility] AT [facility] Provider Note   CSN: [id number] Arrival date & time: 11/14/24  2011     Patient presents with: Shortness of Breath   Diana Mathews is a 74 y.o. female.   74 year old female presents for evaluation of shortness of breath.  She is a poor historian.  States that started suddenly tonight.  She states she felt lightheaded but denies falling.  She denies any chest pain.  States the last few days she has been feeling fine, no fevers or coughing.  Per EMS report, son left for the store patient was fine when he got back he noticed her breathing was abnormal so he called 911.  EMS states that patient was 82% on room air on their arrival and they placed her on nonrebreather.   Shortness of Breath Associated symptoms: no abdominal pain, no chest pain, no cough, no ear pain, no fever, no rash, no sore throat and no vomiting        Prior to Admission medications  Medication Sig Start Date End Date Taking? Authorizing Provider  apixaban  (ELIQUIS ) 5 MG TABS tablet Take 1 tablet (5 mg total) by mouth 2 (two) times daily. 02/24/24   Zollie Lowers, MD  ARIPiprazole  (ABILIFY ) 5 MG tablet Take 1 tablet (5 mg total) by mouth daily. 10/06/24   Zollie Lowers, MD  atorvastatin  (LIPITOR) 80 MG tablet TAKE ONE (1) TABLET BY MOUTH EVERY DAY 10/14/24   Zollie Lowers, MD  baclofen (LIORESAL) 10 MG tablet Take 10 mg by mouth 4 (four) times daily. 04/17/22   [provider]  Blood Glucose Monitoring Suppl (ACCU-CHEK AVIVA PLUS) w/Device KIT Use to check glucose before breakfast and 2 hours after supper 10/06/24   Zollie Lowers, MD  Blood Glucose Monitoring Suppl DEVI 1 each by Does not apply route in the morning, at noon, and at bedtime. May substitute to any manufacturer covered by patient's insurance. 04/28/23   Zollie Lowers, MD  busPIRone  (BUSPAR ) 10 MG tablet Take 1 tablet (10 mg total) by mouth 3 (three) times daily as needed. 10/06/24    Zollie Lowers, MD  carvedilol  (COREG ) 6.25 MG tablet TAKE 1 TABLET BY MOUTH TWICE DAILY WITH FOOD 09/16/24   Zollie Lowers, MD  diltiazem  (CARDIZEM  CD) 240 MG 24 hr capsule TAKE ONE (1) CAPSULE BY MOUTH 2 TIMES DAILY 09/16/24   Zollie Lowers, MD  diphenhydrAMINE (BENADRYL) 25 mg capsule Take 25 mg by mouth at bedtime as needed.    [provider]  DULoxetine  (CYMBALTA ) 60 MG capsule TAKE 1 CAPSULE BY MOUTH EVERY MORNING & AT BEDTIME 10/06/24   Zollie Lowers, MD  fluconazole (DIFLUCAN) 100 MG tablet Take 100 mg by mouth daily. 10/21/24   [provider]  gabapentin  (NEURONTIN ) 300 MG capsule Take 300 mg by mouth 3 (three) times daily. 05/19/24   [provider]  glucose blood (ONETOUCH VERIO) test strip CHECK MORNING, NOON, & BEDTIME Dx E11.8 04/18/24   Zollie Lowers, MD  HYDROcodone-acetaminophen  [facility]) 10-325 MG tablet Take 1 tablet by mouth every 6 (six) hours as needed.    [provider]  letrozole  (FEMARA ) 2.5 MG tablet Take 2.5 mg by mouth daily. 04/02/22   [provider]  meloxicam (MOBIC) 7.5 MG tablet Take 7.5 mg by mouth daily. 10/14/24   [provider]  metFORMIN  (GLUCOPHAGE -XR) 750 MG 24 hr tablet TAKE ONE (1) TABLET BY MOUTH TWO (2) TIMES DAILY 10/06/24   Zollie Lowers, MD  palbociclib [facility]) 125  MG tablet Take by mouth. 04/09/20   [provider]  promethazine  (PHENERGAN ) 25 MG tablet TAKE 1 TABLET BY MOUTH EVERY 6 HOURS AS NEEDED FOR NAUSEA & VOMITING 06/20/24   Zollie Lowers, MD  rOPINIRole  (REQUIP ) 1 MG tablet Take 1 tablet (1 mg total) by mouth at bedtime. 10/06/24   Zollie Lowers, MD  tirzepatide  (MOUNJARO ) 7.5 MG/0.5ML Pen Inject 7.5 mg into the skin once a week. 07/06/24   Zollie Lowers, MD  trazodone  (DESYREL ) 300 MG tablet TAKE 1/3 TO 1 TABLET AT BEDTIME AS NEEDED FOR SLEEP 09/18/24   Zollie Lowers, MD  TRUEplus Lancets 33G MISC Test BS QID Dx E11.8 08/20/21   Zollie Lowers, MD    Allergies: Codeine     Review of Systems  Constitutional:  Positive for fatigue. Negative for chills and fever.  HENT:  Negative for ear pain and sore throat.   Eyes:  Negative for pain and visual disturbance.  Respiratory:  Positive for shortness of breath. Negative for cough.   Cardiovascular:  Negative for chest pain and palpitations.  Gastrointestinal:  Negative for abdominal pain and vomiting.  Genitourinary:  Negative for dysuria and hematuria.  Musculoskeletal:  Negative for arthralgias and back pain.  Skin:  Negative for color change and rash.  Neurological:  Positive for light-headedness. Negative for seizures and syncope.  All other systems reviewed and are negative.   Updated Vital Signs BP (!) 120/49   Pulse 77   Temp 99.1 F (37.3 C) (Axillary)   Resp (!) 22   Ht 5' 3 (1.6 m)   Wt 107.5 kg   SpO2 95%   BMI 41.98 kg/m   Physical Exam Vitals and nursing note reviewed.  Constitutional:      General: She is in acute distress.     Appearance: She is well-developed. She is obese. She is ill-appearing and diaphoretic.  HENT:     Head: Normocephalic and atraumatic.  Eyes:     Conjunctiva/sclera: Conjunctivae normal.  Cardiovascular:     Rate and Rhythm: Normal rate and regular rhythm.     Heart sounds: No murmur heard. Pulmonary:     Effort: Pulmonary effort is normal. No respiratory distress.     Breath sounds: Rhonchi present. No decreased breath sounds.  Abdominal:     Palpations: Abdomen is soft.     Tenderness: There is no abdominal tenderness.  Musculoskeletal:        General: No swelling.     Cervical back: Neck supple.     Right lower leg: Edema present.     Left lower leg: Edema present.  Skin:    General: Skin is warm.     Capillary Refill: Capillary refill takes less than 2 seconds.     Coloration: Skin is pale.  Neurological:     Mental Status: She is alert.  Psychiatric:        Mood and Affect: Mood normal.     (all labs ordered are listed, but only  abnormal results are displayed) Labs Reviewed  PRO BRAIN NATRIURETIC PEPTIDE - Abnormal; Notable for the following components:      Result Value   Pro Brain Natriuretic Peptide 814.0 (*)    All other components within normal limits  CBC WITH DIFFERENTIAL/PLATELET - Abnormal; Notable for the following components:   RBC 2.83 (*)    Hemoglobin 9.4 (*)    HCT 26.9 (*)    All other components within normal limits  BLOOD GAS, ARTERIAL - Abnormal; Notable for  the following components:   pH, Arterial 7.28 (*)    pO2, Arterial 67 (*)    Acid-base deficit 5.2 (*)    All other components within normal limits  BASIC METABOLIC PANEL WITH GFR - Abnormal; Notable for the following components:   Sodium 118 (*)    Potassium 6.0 (*)    Chloride 87 (*)    CO2 17 (*)    Glucose, Bld 286 (*)    BUN 30 (*)    Creatinine, Ser 2.39 (*)    Calcium  7.5 (*)    GFR, Estimated 21 (*)    All other components within normal limits  CBG MONITORING, ED - Abnormal; Notable for the following components:   Glucose-Capillary 296 (*)    All other components within normal limits  TROPONIN T, HIGH SENSITIVITY - Abnormal; Notable for the following components:   Troponin T High Sensitivity 25 (*)    All other components within normal limits  RESP PANEL BY RT-PCR (RSV, FLU A&B, COVID)  [id number]  BASIC METABOLIC PANEL WITH GFR  TROPONIN T, HIGH SENSITIVITY    EKG: EKG Interpretation Date/Time:  Monday November 14 2024 20:32:32 EST Ventricular Rate:  73 PR Interval:  202 QRS Duration:  93 QT Interval:  428 QTC Calculation: 472 R Axis:   87  Text Interpretation: Sinus rhythm Paired ventricular premature complexes Borderline right axis deviation Low voltage, precordial leads Nonspecific T abnormalities, lateral leads Wandering baseline Compared with prior EKG from 06/12/2018 Confirmed by Gennaro Bouchard ([id number]) on 11/14/2024 10:06:57 PM  Radiology: CT Head Wo Contrast Result Date: 11/14/2024 EXAM: CT HEAD WITHOUT  CONTRAST 11/14/2024 09:31:33 PM TECHNIQUE: CT of the head was performed without the administration of intravenous contrast. Automated exposure control, iterative reconstruction, and/or weight based adjustment of the mA/kV was utilized to reduce the radiation dose to as low as reasonably achievable. COMPARISON: 11/01/2024 CLINICAL HISTORY: ams ams FINDINGS: BRAIN AND VENTRICLES: No acute hemorrhage. No evidence of acute infarct. No hydrocephalus. No extra-axial collection. No mass effect or midline shift. Patchy low density throughout the deep white matter, likely chronic small vessel disease, stable. ORBITS: No acute abnormality. SINUSES: No acute abnormality. SOFT TISSUES AND SKULL: No acute soft tissue abnormality. No skull fracture. IMPRESSION: 1. No acute intracranial abnormality. 2. Stable patchy chronic small vessel disease. Electronically signed by: Franky Crease MD 11/14/2024 09:40 PM EST RP Workstation: [id number]   CT Chest Wo Contrast Result Date: 11/14/2024 EXAM: CT CHEST WITHOUT CONTRAST 11/14/2024 09:32:16 PM TECHNIQUE: CT of the chest was performed without the administration of intravenous contrast. Multiplanar reformatted images are provided for review. Automated exposure control, iterative reconstruction, and/or weight based adjustment of the mA/kV was utilized to reduce the radiation dose to as low as reasonably achievable. COMPARISON: CT angiogram chest 10/32/2023. No images were available for direct comparison. CLINICAL HISTORY: sob sob FINDINGS: MEDIASTINUM: The heart is mildly enlarged. Coronary and aortic atherosclerotic calcifications are noted. The central airways are clear. LYMPH NODES: No mediastinal, hilar or axillary lymphadenopathy. LUNGS AND PLEURA: Mild patchy multifocal airspace opacities are seen in the bilateral lower lobes, lingula and right middle lobe. There are also minimal patchy ground glass opacities in the inferior right upper lobe. Airspace consolidation is the most  significant in the left lower lobe. No pleural effusion or pneumothorax. SOFT TISSUES/BONES: Mildly sclerotic changes are seen in the T11 vertebral body and within multiple ribs as mentioned on prior scan. UPPER ABDOMEN: Limited images of the upper abdomen demonstrate likely numerous ill-defined hypodense liver lesions  measuring up to 3.6 cm, indeterminate. IMPRESSION: 1. Multifocal pneumonia within the bilateral lower lobes, lingula, right middle lobe, and inferior right upper lobe; airspace consolidation is most significant in the left lower lobe. 2. Mild cardiomegaly. 3. Numerous ill-defined hypodense liver lesions measuring up to 3.6 cm, indeterminate; recommend further evaluation with contrast-enhanced MRI or multiphase CT of the liver. 4. Indeterminate sclerotic osseous lesions involving the T11 vertebral body and multiple ribs. Electronically signed by: Greig Pique MD 11/14/2024 09:40 PM EST RP Workstation: [id number]   CT Cervical Spine Wo Contrast Result Date: 11/14/2024 EXAM: CT CERVICAL SPINE WITHOUT CONTRAST 11/14/2024 09:31:33 PM TECHNIQUE: CT of the cervical spine was performed without the administration of intravenous contrast. Multiplanar reformatted images are provided for review. Automated exposure control, iterative reconstruction, and/or weight based adjustment of the mA/kV was utilized to reduce the radiation dose to as low as reasonably achievable. COMPARISON: None available. CLINICAL HISTORY: ams, hit head, fell FINDINGS: BONES AND ALIGNMENT: Diffuse sclerosis throughout the C5 vertebral body. Sclerotic focus in the posterior elements at C7. Sclerosis within the mid left clavicle. Findings compatible with sclerotic metastases. No acute fracture or traumatic malalignment. DEGENERATIVE CHANGES: Moderate bilateral degenerative facet disease diffusely. Degenerative disc disease at C5-C6 and C6-C7. SOFT TISSUES: No prevertebral soft tissue swelling. IMPRESSION: 1. 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 Electronically signed by: Franky Crease MD 11/14/2024 09:39 PM EST RP Workstation: [id number]   DG Chest 1 View Result Date: 11/14/2024 CLINICAL DATA:  Shortness of breath. EXAM: CHEST  1 VIEW COMPARISON:  Chest radiograph dated 06/12/2018. FINDINGS: There is mild eventration of the right hemidiaphragm. No focal consolidation, pleural effusion or pneumothorax. There is mild cardiomegaly with mild central vascular congestion. No acute osseous pathology. IMPRESSION: Mild cardiomegaly with mild central vascular congestion. No focal consolidation. Electronically Signed   By: Vanetta Chou M.D.   On: 11/14/2024 20:54     .Critical Care  Performed by: Gennaro Duwaine CROME, DO Authorized by: Gennaro Duwaine CROME, DO   Critical care provider statement:    Critical care time (minutes):  55   Critical care time was exclusive of:  Separately billable procedures and treating other patients and teaching time   Critical care was necessary to treat or prevent imminent or life-threatening deterioration of the following conditions:  Cardiac failure, respiratory failure, CNS failure or compromise and metabolic crisis   Critical care was time spent personally by me on the following activities:  Blood draw for specimens, development of treatment plan with patient or surrogate, discussions with consultants, evaluation of patient's response to treatment, examination of patient, interpretation of cardiac output measurements, obtaining history from patient or surrogate, review of old charts, re-evaluation of patient's condition, pulse oximetry, ordering and review of radiographic studies, ordering and review of laboratory studies and ordering and performing treatments and interventions   Care discussed with: admitting provider      Medications Ordered in the ED  sodium zirconium cyclosilicate  (LOKELMA ) packet 10 g (10 g  Oral Not Given 11/14/24 2156)  calcium  gluconate 1 g/ 50 mL sodium chloride  IVPB (1,000 mg Intravenous New Bag/Given 11/14/24 2150)  cefTRIAXone  (ROCEPHIN ) 1 g in sodium chloride  0.9 % 100 mL IVPB (has no administration in time range)  doxycycline  (VIBRAMYCIN ) 100 mg in sodium chloride  0.9 % 250 mL IVPB (has no administration in time range)  ipratropium-albuterol  (DUONEB) 0.5-2.5 (3) MG/3ML nebulizer solution 3 mL (3 mLs Nebulization Given 11/14/24 2056)  furosemide  (LASIX ) injection 40 mg (40 mg Intravenous Given 11/14/24 2141)  sodium bicarbonate  injection 50 mEq (50 mEq Intravenous Given 11/14/24 2145)  sodium chloride  0.9 % bolus 250 mL (250 mLs Intravenous New Bag/Given 11/14/24 2155)  dextrose  50 % solution 50 mL (50 mLs Intravenous Given 11/14/24 2235)  insulin  aspart (novoLOG ) injection 10 Units (10 Units Intravenous Given 11/14/24 2234)                                    Medical Decision Making Cardiac monitor interpretation: sinus rhythm, no ectopy  Patient brought in by EMS for evaluation of hypoxic respiratory failure and shortness of breath.  Patient is a fairly poor historian.  She is hypoxic and tachypneic with leg swelling on arrival.  We switched from nonrebreather to BiPAP this did seem to help her symptoms.  I gave her Lasix  initially as well.  Was found to have multifocal pneumonia and started on broad-spectrum antibiotics.  Also found to have significant electrolyte derangements and an AKI.  She is hyponatremic and despite fluid overload and a very small bolus of normal saline.  Hyperkalemia was treated as well.  She did become somewhat more confused and lethargic while in the ER.  She does wake up, but is not super easily arousable but will answer questions appropriately.  I had a long discussion with patient and her son at bedside and patient is currently DNR/DNI.  Discussed patient's case with hospice and patient admitted for further workup and management.  Patient and family were  agreeable to plan.  Problems Addressed: Acute respiratory failure with hypoxia and hypercapnia (HCC): acute illness or injury that poses a threat to life or bodily functions AKI (acute kidney injury): acute illness or injury Altered mental status, unspecified altered mental status type: undiagnosed new problem with uncertain prognosis Hyperkalemia: acute illness or injury that poses a threat to life or bodily functions Hyponatremia: acute illness or injury that poses a threat to life or bodily functions Multifocal pneumonia: acute illness or injury that poses a threat to life or bodily functions  Amount and/or Complexity of Data Reviewed Independent Historian: EMS    Details: Patient is confused, hypoxic, poor historian - EMS gave history states she was 82% on room air, they placed her on non-rebreather  External Data Reviewed: notes.    Details: Prior hospital records reviewed and patient seen 11/01/24 for AKI and admitted Labs: ordered. Decision-making details documented in ED Course.    Details: Ordered and reviewed by me and patient with AKI, hyperkalemia, hyponatremia  Radiology: ordered and independent interpretation performed. Decision-making details documented in ED Course.    Details: Ordered and interpreted by me independently of radiology Chest xray: shows vascular congestion and cardiomegaly CT C spine: shows metatstatic disease CT head: shows no acute abnormality CT chest: shows multifocal pneumonia  ECG/medicine tests: ordered and independent interpretation performed. Decision-making details documented in ED Course.    Details: Ordered and interpreted by me in the absence of cardiology and shows sinus rhythm, no STEMI, or significant change when compared to prior EKG Discussion of management or test interpretation with external provider(s): Dr. Shona - hospitalist - I spoke with her regarding the patient's case and she will admit the patient for further workup and management    Risk OTC drugs. Prescription drug management. Parenteral controlled substances. Drug therapy requiring intensive monitoring for toxicity. Decision regarding hospitalization. Diagnosis or treatment significantly limited  by social determinants of health. Risk Details: CRITICAL CARE Performed by: Duwaine LITTIE Fusi   Total critical care time: 55 minutes  Critical care time was exclusive of separately billable procedures and treating other patients.  Critical care was necessary to treat or prevent imminent or life-threatening deterioration.  Critical care was time spent personally by me on the following activities: development of treatment plan with patient and/or surrogate as well as nursing, discussions with consultants, evaluation of patient's response to treatment, examination of patient, obtaining history from patient or surrogate, ordering and performing treatments and interventions, ordering and review of laboratory studies, ordering and review of radiographic studies, pulse oximetry and re-evaluation of patient's condition.   Critical Care Total time providing critical care: 55 minutes     Final diagnoses:  Acute respiratory failure with hypoxia and hypercapnia (HCC)  Multifocal pneumonia  Hyponatremia  Hyperkalemia  AKI (acute kidney injury)  Altered mental status, unspecified altered mental status type    ED Discharge Orders     None          Fusi Duwaine LITTIE, DO 11/14/24 2237  "

## 2024-11-14 NOTE — H&P (Incomplete)
 " History and Physical  Diana Mathews FMW:[id number] DOB: 05/27/1951 DOA: 11/14/2024  Referring physician: Dr. Gennaro, EDP  PCP: Diana Lowers, MD  Outpatient Specialists: Medical oncology. Patient coming from: Home.  Chief Complaint: Shortness of breath  HPI: Diana Mathews is a 74 y.o. female with medical history significant for metastatic breast cancer with mets to the bone on letrozole , paroxysmal A-fib on Eliquis , type 2 diabetes, hypertension, hyperlipidemia, iron deficiency anemia, severe morbid obesity, CKD 4, recently admitted from 11/01/2024 through 11/03/2024 for AKI on CKD 4, presents to the ER due to sudden onset shortness of breath tonight.  Her son noticed that she was having increased work of breathing and called EMS.  Upon EMS arrival, the patient was saturating 82% on room air.  She was placed on nonrebreather and brought to the ER for further evaluation.  In the ER, tachycardic and tachypneic.  The patient was placed on BiPAP due to increased work of breathing.  Imaging revealed multifocal pneumonia within the bilateral lower lobes, lingula, right middle lobe and inferior right upper lobe, airspace consolidation is most significant in the left lower lobe.  The patient received empiric IV antibiotics, IV Rocephin  and IV doxycycline .  TRH, hospitalist service, was asked to admit.  ED Course: Temperature 99.3.  BP 131/57, pulse 81, respiration rate 18 blood, O2 saturation 99% on BiPAP 50% FiO2.  Review of Systems: Review of systems as noted in the HPI. All other systems reviewed and are negative.   Past Medical History:  Diagnosis Date   Atrial fibrillation (HCC) 06/12/2018   Depression    Essential hypertension    Headache    Hyperlipidemia    Metastatic breast cancer    Left breast, metastatic to bone and with pulmonary nodules - [facility]   Osteoarthritis    Sleep apnea    TIA (transient ischemic attack)    Type 2 diabetes mellitus (HCC)    Past  Surgical History:  Procedure Laterality Date   ABDOMINAL HYSTERECTOMY     CHOLECYSTECTOMY     FOOT SURGERY Left    HERNIA REPAIR     TUBAL LIGATION     WRIST SURGERY Bilateral    Carpal Tunnel    Social History:  reports that she quit smoking about 39 years ago. Her smoking use included cigarettes. She has never used smokeless tobacco. She reports that she does not drink alcohol  and does not use drugs.   Allergies[1]  Family History  Problem Relation Age of Onset   Arthritis Mother    COPD Mother    Diabetes Mother    Hypertension Mother    Stroke Mother    Arthritis Sister    Diabetes Brother    Arthritis Sister    Diabetes Sister    Diabetes Sister    Anxiety disorder Sister    Depression Sister    Diabetes Brother    Mental illness Brother    Obesity Brother       Prior to Admission medications  Medication Sig Start Date End Date Taking? Authorizing Provider  apixaban  (ELIQUIS ) 5 MG TABS tablet Take 1 tablet (5 mg total) by mouth 2 (two) times daily. 02/24/24   Diana Lowers, MD  ARIPiprazole  (ABILIFY ) 5 MG tablet Take 1 tablet (5 mg total) by mouth daily. 10/06/24   Diana Lowers, MD  atorvastatin  (LIPITOR) 80 MG tablet TAKE ONE (1) TABLET BY MOUTH EVERY DAY 10/14/24   Diana Lowers, MD  baclofen (LIORESAL) 10 MG tablet Take 10 mg  by mouth 4 (four) times daily. 04/17/22   [provider]  Blood Glucose Monitoring Suppl (ACCU-CHEK AVIVA PLUS) w/Device KIT Use to check glucose before breakfast and 2 hours after supper 10/06/24   Diana Lowers, MD  Blood Glucose Monitoring Suppl DEVI 1 each by Does not apply route in the morning, at noon, and at bedtime. May substitute to any manufacturer covered by patient's insurance. 04/28/23   Diana Lowers, MD  busPIRone  (BUSPAR ) 10 MG tablet Take 1 tablet (10 mg total) by mouth 3 (three) times daily as needed. 10/06/24   Diana Lowers, MD  carvedilol  (COREG ) 6.25 MG tablet TAKE 1 TABLET BY MOUTH TWICE DAILY WITH FOOD  09/16/24   Diana Lowers, MD  diltiazem  (CARDIZEM  CD) 240 MG 24 hr capsule TAKE ONE (1) CAPSULE BY MOUTH 2 TIMES DAILY 09/16/24   Diana Lowers, MD  diphenhydrAMINE (BENADRYL) 25 mg capsule Take 25 mg by mouth at bedtime as needed.    [provider]  DULoxetine  (CYMBALTA ) 60 MG capsule TAKE 1 CAPSULE BY MOUTH EVERY MORNING & AT BEDTIME 10/06/24   Diana Lowers, MD  fluconazole (DIFLUCAN) 100 MG tablet Take 100 mg by mouth daily. 10/21/24   [provider]  gabapentin  (NEURONTIN ) 300 MG capsule Take 300 mg by mouth 3 (three) times daily. 05/19/24   [provider]  glucose blood (ONETOUCH VERIO) test strip CHECK MORNING, NOON, & BEDTIME Dx E11.8 04/18/24   Diana Lowers, MD  HYDROcodone-acetaminophen  (NORCO) 10-325 MG tablet Take 1 tablet by mouth every 6 (six) hours as needed.    [provider]  letrozole  (FEMARA ) 2.5 MG tablet Take 2.5 mg by mouth daily. 04/02/22   [provider]  meloxicam (MOBIC) 7.5 MG tablet Take 7.5 mg by mouth daily. 10/14/24   [provider]  metFORMIN  (GLUCOPHAGE -XR) 750 MG 24 hr tablet TAKE ONE (1) TABLET BY MOUTH TWO (2) TIMES DAILY 10/06/24   Diana Lowers, MD  palbociclib [facility]) 125 MG tablet Take by mouth. 04/09/20   [provider]  promethazine  (PHENERGAN ) 25 MG tablet TAKE 1 TABLET BY MOUTH EVERY 6 HOURS AS NEEDED FOR NAUSEA & VOMITING 06/20/24   Diana Lowers, MD  rOPINIRole  (REQUIP ) 1 MG tablet Take 1 tablet (1 mg total) by mouth at bedtime. 10/06/24   Diana Lowers, MD  tirzepatide  (MOUNJARO ) 7.5 MG/0.5ML Pen Inject 7.5 mg into the skin once a week. 07/06/24   Diana Lowers, MD  trazodone  (DESYREL ) 300 MG tablet TAKE 1/3 TO 1 TABLET AT BEDTIME AS NEEDED FOR SLEEP 09/18/24   Diana Lowers, MD  TRUEplus Lancets 33G MISC Test BS QID Dx E11.8 08/20/21   Diana Lowers, MD    Physical Exam: BP (!) 120/49   Pulse 77   Temp 99.1 F (37.3 C) (Axillary)   Resp (!) 22   Ht 5' 3 (1.6 m)   Wt  107.5 kg   SpO2 95%   BMI 41.98 kg/m   General: 74 y.o. year-old female well developed well nourished in no acute distress.  Alert and oriented x3. Cardiovascular: Regular rate and rhythm with no rubs or gallops.  No thyromegaly or JVD noted.  No lower extremity edema. 2/4 pulses in all 4 extremities. Respiratory: On BiPAP.  Diffuse rales bilaterally.  Poor inspiratory effort. Abdomen: Soft nontender nondistended with normal bowel sounds x4 quadrants. Muskuloskeletal: No cyanosis, clubbing or edema noted bilaterally Neuro: CN II-XII intact, strength, sensation, reflexes Skin: No ulcerative lesions noted or rashes Psychiatry: Judgement and insight appear normal. Mood is  appropriate for condition and setting          Labs on Admission:  Basic Metabolic Panel: Recent Labs  Lab 11/14/24 2041  NA 118*  K 6.0*  CL 87*  CO2 17*  GLUCOSE 286*  BUN 30*  CREATININE 2.39*  CALCIUM  7.5*   Liver Function Tests: No results for input(s): AST, ALT, ALKPHOS, BILITOT, PROT, ALBUMIN in the last 168 hours. No results for input(s): LIPASE, AMYLASE in the last 168 hours. No results for input(s): AMMONIA in the last 168 hours. CBC: Recent Labs  Lab 11/14/24 2039  WBC 5.9  NEUTROABS 4.7  HGB 9.4*  HCT 26.9*  MCV 95.1  PLT 223   Cardiac Enzymes: No results for input(s): CKTOTAL, CKMB, CKMBINDEX, TROPONINI in the last 168 hours.  BNP (last 3 results) No results for input(s): BNP in the last 8760 hours.  ProBNP (last 3 results) Recent Labs    11/14/24 2039  PROBNP 814.0*    CBG: Recent Labs  Lab 11/14/24 2044  GLUCAP 296*    Radiological Exams on Admission: CT Head Wo Contrast Result Date: 11/14/2024 EXAM: CT HEAD WITHOUT CONTRAST 11/14/2024 09:31:33 PM TECHNIQUE: CT of the head was performed without the administration of intravenous contrast. Automated exposure control, iterative reconstruction, and/or weight based adjustment of the mA/kV was  utilized to reduce the radiation dose to as low as reasonably achievable. COMPARISON: 11/01/2024 CLINICAL HISTORY: ams ams FINDINGS: BRAIN AND VENTRICLES: No acute hemorrhage. No evidence of acute infarct. No hydrocephalus. No extra-axial collection. No mass effect or midline shift. Patchy low density throughout the deep white matter, likely chronic small vessel disease, stable. ORBITS: No acute abnormality. SINUSES: No acute abnormality. SOFT TISSUES AND SKULL: No acute soft tissue abnormality. No skull fracture. IMPRESSION: 1. No acute intracranial abnormality. 2. Stable patchy chronic small vessel disease. Electronically signed by: Franky Crease MD 11/14/2024 09:40 PM EST RP Workstation: [id number]   CT Chest Wo Contrast Result Date: 11/14/2024 EXAM: CT CHEST WITHOUT CONTRAST 11/14/2024 09:32:16 PM TECHNIQUE: CT of the chest was performed without the administration of intravenous contrast. Multiplanar reformatted images are provided for review. Automated exposure control, iterative reconstruction, and/or weight based adjustment of the mA/kV was utilized to reduce the radiation dose to as low as reasonably achievable. COMPARISON: CT angiogram chest 10/32/2023. No images were available for direct comparison. CLINICAL HISTORY: sob sob FINDINGS: MEDIASTINUM: The heart is mildly enlarged. Coronary and aortic atherosclerotic calcifications are noted. The central airways are clear. LYMPH NODES: No mediastinal, hilar or axillary lymphadenopathy. LUNGS AND PLEURA: Mild patchy multifocal airspace opacities are seen in the bilateral lower lobes, lingula and right middle lobe. There are also minimal patchy ground glass opacities in the inferior right upper lobe. Airspace consolidation is the most significant in the left lower lobe. No pleural effusion or pneumothorax. SOFT TISSUES/BONES: Mildly sclerotic changes are seen in the T11 vertebral body and within multiple ribs as mentioned on prior scan. UPPER ABDOMEN: Limited  images of the upper abdomen demonstrate likely numerous ill-defined hypodense liver lesions measuring up to 3.6 cm, indeterminate. IMPRESSION: 1. Multifocal pneumonia within the bilateral lower lobes, lingula, right middle lobe, and inferior right upper lobe; airspace consolidation is most significant in the left lower lobe. 2. Mild cardiomegaly. 3. Numerous ill-defined hypodense liver lesions measuring up to 3.6 cm, indeterminate; recommend further evaluation with contrast-enhanced MRI or multiphase CT of the liver. 4. Indeterminate sclerotic osseous lesions involving the T11 vertebral body and multiple ribs. Electronically signed by: Greig Pique MD 11/14/2024  09:40 PM EST RP Workstation: [id number]   CT Cervical Spine Wo Contrast Result Date: 11/14/2024 EXAM: CT CERVICAL SPINE WITHOUT CONTRAST 11/14/2024 09:31:33 PM TECHNIQUE: CT of the cervical spine was performed without the administration of intravenous contrast. Multiplanar reformatted images are provided for review. Automated exposure control, iterative reconstruction, and/or weight based adjustment of the mA/kV was utilized to reduce the radiation dose to as low as reasonably achievable. COMPARISON: None available. CLINICAL HISTORY: ams, hit head, fell FINDINGS: BONES AND ALIGNMENT: Diffuse sclerosis throughout the C5 vertebral body. Sclerotic focus in the posterior elements at C7. Sclerosis within the mid left clavicle. Findings compatible with sclerotic metastases. No acute fracture or traumatic malalignment. DEGENERATIVE CHANGES: Moderate bilateral degenerative facet disease diffusely. Degenerative disc disease at C5-C6 and C6-C7. SOFT TISSUES: No prevertebral soft tissue swelling. IMPRESSION: 1. 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 Electronically signed by: Franky Crease MD 11/14/2024 09:39 PM EST RP Workstation: [id number]   DG Chest 1 View Result Date:  11/14/2024 CLINICAL DATA:  Shortness of breath. EXAM: CHEST  1 VIEW COMPARISON:  Chest radiograph dated 06/12/2018. FINDINGS: There is mild eventration of the right hemidiaphragm. No focal consolidation, pleural effusion or pneumothorax. There is mild cardiomegaly with mild central vascular congestion. No acute osseous pathology. IMPRESSION: Mild cardiomegaly with mild central vascular congestion. No focal consolidation. Electronically Signed   By: Vanetta Chou M.D.   On: 11/14/2024 20:54    EKG: I independently viewed the EKG done and my findings are as followed:Sinus rhythm rate of 73.  QTc 472    Assessment/Plan Present on Admission:  Acute hypoxic respiratory failure (HCC)  Principal Problem:   Acute hypoxic respiratory failure (HCC)  Acute hypoxic respiratory failure secondary to multifocal pneumonia, POA Not on oxygen supplementation at baseline. Reportedly with O2 saturation of 82% on room air, per EMS assessment. Currently on BiPAP, wean off as tolerated. Continue to treat underlying conditions Continue Rocephin  and IV doxycycline  DuoNebs every 6 hours DuoNebs every 2 hours as needed for wheezing or shortness of breath  Elevated troponin in the setting of the above, suspect demand ischemia High sensitive troponin flat 25, 25 No evidence of acute ischemia on 12 lead EKG Monitor on telemetry  Hyponatremia, POA Presented with serum sodium of 117 and glucose of 354.  Sodium corrected for hyperglycemia 121. Follow serum osmolality, urine osmolality and urine sodium. Gentle IV fluid hydration NS at 50 cc/h x 12 hours. BMP every 4 hours, avoid quick correction of hyponatremia.  Hyperkalemia Potassium 5.7 Treated with IV insulin , IV dextrose , IV calcium  gluconate, Lokelma  Repeat BMP  Mild anion gap metabolic acidosis in the setting of acute renal insufficiency Presented with serum bicarb 17, anion gap of 15 Continue to treat underlying conditions. Repeat BMP  Acute on  CKD 4 Presented with creatinine of 2.75 with GFR of 16. Baseline creatinine of 2.54 with GFR of 20. Monitor urine output Avoid nephrotoxic agents and hypotension.  Type 2 diabetes with hyperglycemia Presented with serum glucose of 354. Hemoglobin A1c 8.3 on 11/01/2024. Insulin  coverage in place.  Paroxysmal A-fib on Eliquis  Resume home Eliquis  when no longer n.p.o. while on BiPAP. Currently rate controlled Continue to monitor on telemetry.  Anemia of chronic disease Hemoglobin 9.4 No reported overt bleeding Continue to monitor H&H.  Obesity BMI 44 Recommend weight loss outpatient with regular physical activity and healthy dieting.  Metastatic breast cancer with bone metastasis Currently on letrozole , prior to admission Close  follow-up appointment with oncology.   Critical care time: 55 minutes.   DVT prophylaxis: On Eliquis .  Code Status: Full code.  Family Communication: Son at bedside.  Disposition Plan: Admitted to stepdown unit.  Consults called: None.  Admission status: Inpatient status.   Status is: Inpatient The patient requires at least 2 midnight for further evaluation and treatment of present condition.   Terry LOISE Hurst MD [facility] Pager [phone]  If 7PM-7AM, please contact night-coverage [email or web address] Password [email or web address]  11/14/2024, 11:07 PM      [1]  Allergies Allergen Reactions   Codeine Rash   "

## 2024-11-14 NOTE — Progress Notes (Signed)
 Pt transported to ICU4 on Bipap. No complications noted

## 2024-11-15 ENCOUNTER — Inpatient Hospital Stay (HOSPITAL_COMMUNITY)

## 2024-11-15 ENCOUNTER — Telehealth: Payer: Self-pay | Admitting: Family Medicine

## 2024-11-15 DIAGNOSIS — J9601 Acute respiratory failure with hypoxia: Secondary | ICD-10-CM

## 2024-11-15 DIAGNOSIS — I480 Paroxysmal atrial fibrillation: Secondary | ICD-10-CM | POA: Diagnosis not present

## 2024-11-15 LAB — BASIC METABOLIC PANEL WITH GFR
Anion gap: 15 (ref 5–15)
Anion gap: 15 (ref 5–15)
Anion gap: 15 (ref 5–15)
Anion gap: 16 — ABNORMAL HIGH (ref 5–15)
BUN: 29 mg/dL — ABNORMAL HIGH (ref 8–23)
BUN: 30 mg/dL — ABNORMAL HIGH (ref 8–23)
BUN: 29 mg/dL — ABNORMAL HIGH (ref 8–23)
BUN: 31 mg/dL — ABNORMAL HIGH (ref 8–23)
CO2: 17 mmol/L — ABNORMAL LOW (ref 22–32)
CO2: 17 mmol/L — ABNORMAL LOW (ref 22–32)
CO2: 19 mmol/L — ABNORMAL LOW (ref 22–32)
CO2: 17 mmol/L — ABNORMAL LOW (ref 22–32)
Calcium: 8.1 mg/dL — ABNORMAL LOW (ref 8.9–10.3)
Calcium: 8 mg/dL — ABNORMAL LOW (ref 8.9–10.3)
Calcium: 7.9 mg/dL — ABNORMAL LOW (ref 8.9–10.3)
Calcium: 7.9 mg/dL — ABNORMAL LOW (ref 8.9–10.3)
Chloride: 85 mmol/L — ABNORMAL LOW (ref 98–111)
Chloride: 89 mmol/L — ABNORMAL LOW (ref 98–111)
Chloride: 88 mmol/L — ABNORMAL LOW (ref 98–111)
Chloride: 90 mmol/L — ABNORMAL LOW (ref 98–111)
Creatinine, Ser: 2.75 mg/dL — ABNORMAL HIGH (ref 0.44–1.00)
Creatinine, Ser: 2.84 mg/dL — ABNORMAL HIGH (ref 0.44–1.00)
Creatinine, Ser: 3 mg/dL — ABNORMAL HIGH (ref 0.44–1.00)
Creatinine, Ser: 2.95 mg/dL — ABNORMAL HIGH (ref 0.44–1.00)
GFR, Estimated: 18 mL/min — ABNORMAL LOW
GFR, Estimated: 17 mL/min — ABNORMAL LOW
GFR, Estimated: 16 mL/min — ABNORMAL LOW
GFR, Estimated: 16 mL/min — ABNORMAL LOW
Glucose, Bld: 354 mg/dL — ABNORMAL HIGH (ref 70–99)
Glucose, Bld: 170 mg/dL — ABNORMAL HIGH (ref 70–99)
Glucose, Bld: 172 mg/dL — ABNORMAL HIGH (ref 70–99)
Glucose, Bld: 190 mg/dL — ABNORMAL HIGH (ref 70–99)
Potassium: 5.7 mmol/L — ABNORMAL HIGH (ref 3.5–5.1)
Potassium: 5.3 mmol/L — ABNORMAL HIGH (ref 3.5–5.1)
Potassium: 5.4 mmol/L — ABNORMAL HIGH (ref 3.5–5.1)
Potassium: 5.5 mmol/L — ABNORMAL HIGH (ref 3.5–5.1)
Sodium: 117 mmol/L — CL (ref 135–145)
Sodium: 121 mmol/L — ABNORMAL LOW (ref 135–145)
Sodium: 123 mmol/L — ABNORMAL LOW (ref 135–145)
Sodium: 123 mmol/L — ABNORMAL LOW (ref 135–145)

## 2024-11-15 LAB — SODIUM, URINE, RANDOM: Sodium, Ur: 32 mmol/L

## 2024-11-15 LAB — ECHOCARDIOGRAM COMPLETE
Height: 63 in
S' Lateral: 3.7 cm
Weight: 4014.14 [oz_av]

## 2024-11-15 LAB — GLUCOSE, CAPILLARY
Glucose-Capillary: 150 mg/dL — ABNORMAL HIGH (ref 70–99)
Glucose-Capillary: 160 mg/dL — ABNORMAL HIGH (ref 70–99)
Glucose-Capillary: 167 mg/dL — ABNORMAL HIGH (ref 70–99)
Glucose-Capillary: 173 mg/dL — ABNORMAL HIGH (ref 70–99)
Glucose-Capillary: 189 mg/dL — ABNORMAL HIGH (ref 70–99)
Glucose-Capillary: 190 mg/dL — ABNORMAL HIGH (ref 70–99)
Glucose-Capillary: 196 mg/dL — ABNORMAL HIGH (ref 70–99)

## 2024-11-15 LAB — OSMOLALITY: Osmolality: 277 mosm/kg (ref 275–295)

## 2024-11-15 LAB — OSMOLALITY, URINE: Osmolality, Ur: 286 mosm/kg — ABNORMAL LOW (ref 300–900)

## 2024-11-15 LAB — MRSA NEXT GEN BY PCR, NASAL: MRSA by PCR Next Gen: DETECTED — AB

## 2024-11-15 LAB — TROPONIN T, HIGH SENSITIVITY: Troponin T High Sensitivity: 25 ng/L — ABNORMAL HIGH (ref 0–19)

## 2024-11-15 LAB — ALBUMIN: Albumin: 3.6 g/dL (ref 3.5–5.0)

## 2024-11-15 MED ORDER — INSULIN ASPART 100 UNIT/ML IJ SOLN
0.0000 [IU] | INTRAMUSCULAR | Status: DC
  Administered 2024-11-15: 3 [IU] via SUBCUTANEOUS
  Administered 2024-11-15 (×6): 4 [IU] via SUBCUTANEOUS
  Administered 2024-11-16: 7 [IU] via SUBCUTANEOUS
  Administered 2024-11-16 (×4): 4 [IU] via SUBCUTANEOUS
  Administered 2024-11-17: 3 [IU] via SUBCUTANEOUS
  Administered 2024-11-17: 11 [IU] via SUBCUTANEOUS
  Administered 2024-11-17: 7 [IU] via SUBCUTANEOUS
  Administered 2024-11-17 (×3): 4 [IU] via SUBCUTANEOUS
  Administered 2024-11-17 – 2024-11-18 (×2): 7 [IU] via SUBCUTANEOUS
  Administered 2024-11-18: 4 [IU] via SUBCUTANEOUS
  Administered 2024-11-18: 7 [IU] via SUBCUTANEOUS
  Administered 2024-11-18: 4 [IU] via SUBCUTANEOUS
  Administered 2024-11-18: 7 [IU] via SUBCUTANEOUS
  Administered 2024-11-18: 4 [IU] via SUBCUTANEOUS
  Administered 2024-11-19: 20 [IU] via SUBCUTANEOUS
  Administered 2024-11-19: 3 [IU] via SUBCUTANEOUS
  Administered 2024-11-19: 11 [IU] via SUBCUTANEOUS
  Administered 2024-11-19: 15 [IU] via SUBCUTANEOUS
  Administered 2024-11-19: 4 [IU] via SUBCUTANEOUS
  Administered 2024-11-19: 7 [IU] via SUBCUTANEOUS
  Administered 2024-11-20: 4 [IU] via SUBCUTANEOUS
  Administered 2024-11-20: 7 [IU] via SUBCUTANEOUS
  Administered 2024-11-20: 4 [IU] via SUBCUTANEOUS
  Administered 2024-11-20: 7 [IU] via SUBCUTANEOUS
  Administered 2024-11-20: 15 [IU] via SUBCUTANEOUS
  Administered 2024-11-21 (×2): 7 [IU] via SUBCUTANEOUS
  Administered 2024-11-21: 3 [IU] via SUBCUTANEOUS
  Administered 2024-11-21: 11 [IU] via SUBCUTANEOUS
  Administered 2024-11-21 – 2024-11-22 (×4): 4 [IU] via SUBCUTANEOUS
  Administered 2024-11-22: 11 [IU] via SUBCUTANEOUS
  Administered 2024-11-22: 7 [IU] via SUBCUTANEOUS
  Administered 2024-11-22: 4 [IU] via SUBCUTANEOUS
  Administered 2024-11-22: 7 [IU] via SUBCUTANEOUS
  Administered 2024-11-22 – 2024-11-23 (×3): 4 [IU] via SUBCUTANEOUS
  Filled 2024-11-15 (×51): qty 1

## 2024-11-15 MED ORDER — SODIUM CHLORIDE 0.9 % IV SOLN
100.0000 mg | Freq: Two times a day (BID) | INTRAVENOUS | Status: DC
  Administered 2024-11-15 – 2024-11-16 (×3): 100 mg via INTRAVENOUS
  Filled 2024-11-15 (×5): qty 100

## 2024-11-15 MED ORDER — MUPIROCIN 2 % EX OINT
1.0000 | TOPICAL_OINTMENT | Freq: Two times a day (BID) | CUTANEOUS | Status: AC
  Administered 2024-11-15 – 2024-11-19 (×10): 1 via NASAL
  Filled 2024-11-15: qty 22

## 2024-11-15 MED ORDER — SODIUM CHLORIDE 0.9 % IV SOLN: INTRAVENOUS | Status: AC

## 2024-11-15 MED ORDER — IPRATROPIUM-ALBUTEROL 0.5-2.5 (3) MG/3ML IN SOLN
3.0000 mL | Freq: Four times a day (QID) | RESPIRATORY_TRACT | Status: DC
  Administered 2024-11-15 – 2024-11-18 (×15): 3 mL via RESPIRATORY_TRACT
  Filled 2024-11-15 (×15): qty 3

## 2024-11-15 MED ORDER — ORAL CARE MOUTH RINSE: 15.0000 mL | OROMUCOSAL | Status: DC | PRN

## 2024-11-15 MED ORDER — ORAL CARE MOUTH RINSE
15.0000 mL | OROMUCOSAL | Status: DC
  Administered 2024-11-15 – 2024-11-24 (×33): 15 mL via OROMUCOSAL

## 2024-11-15 MED ORDER — SODIUM CHLORIDE 0.9 % IV SOLN: INTRAVENOUS | Status: DC

## 2024-11-15 MED ORDER — FUROSEMIDE 10 MG/ML IJ SOLN
80.0000 mg | Freq: Once | INTRAMUSCULAR | Status: AC
  Administered 2024-11-15: 80 mg via INTRAVENOUS
  Filled 2024-11-15: qty 8

## 2024-11-15 MED ORDER — GUAIFENESIN-DM 100-10 MG/5ML PO SYRP
5.0000 mL | ORAL_SOLUTION | ORAL | Status: DC | PRN
  Administered 2024-11-16 – 2024-11-19 (×3): 5 mL via ORAL
  Filled 2024-11-15 (×3): qty 5

## 2024-11-15 MED ORDER — IPRATROPIUM-ALBUTEROL 0.5-2.5 (3) MG/3ML IN SOLN: 3.0000 mL | RESPIRATORY_TRACT | Status: DC | PRN

## 2024-11-15 MED ORDER — ACETAMINOPHEN 325 MG PO TABS
650.0000 mg | ORAL_TABLET | Freq: Four times a day (QID) | ORAL | Status: AC | PRN
  Administered 2024-11-15 – 2024-11-16 (×2): 650 mg via ORAL
  Filled 2024-11-15 (×2): qty 2

## 2024-11-15 MED ORDER — PERFLUTREN LIPID MICROSPHERE
1.0000 mL | INTRAVENOUS | Status: AC | PRN
  Administered 2024-11-15: 3 mL via INTRAVENOUS

## 2024-11-15 MED ORDER — INSULIN ASPART 100 UNIT/ML IV SOLN: 10.0000 [IU] | Freq: Once | INTRAVENOUS | Status: DC

## 2024-11-15 MED ORDER — SODIUM CHLORIDE 0.9 % IV SOLN
2.0000 g | INTRAVENOUS | Status: DC
  Administered 2024-11-15 – 2024-11-20 (×6): 2 g via INTRAVENOUS
  Filled 2024-11-15 (×6): qty 20

## 2024-11-15 MED ORDER — ACETAMINOPHEN 650 MG RE SUPP: 650.0000 mg | Freq: Four times a day (QID) | RECTAL | Status: AC | PRN

## 2024-11-15 NOTE — Plan of Care (Signed)
" °  Problem: Clinical Measurements: Goal: Respiratory complications will improve Outcome: Not Progressing Goal: Cardiovascular complication will be avoided Outcome: Progressing   Problem: Activity: Goal: Risk for activity intolerance will decrease Outcome: Not Progressing   Problem: Nutrition: Goal: Adequate nutrition will be maintained Outcome: Not Progressing   Problem: Elimination: Goal: Will not experience complications related to urinary retention Outcome: Not Progressing   "

## 2024-11-15 NOTE — Evaluation (Signed)
 Physical Therapy Evaluation Patient Details Name: [name] MRN: [id number] DOB: 07/09/1951 Today's Date: 11/15/2024  History of Present Illness  Diana Mathews is a 74 y.o. female with medical history significant for metastatic breast cancer with mets to the bone on letrozole , paroxysmal A-fib on Eliquis , type 2 diabetes, hypertension, hyperlipidemia, iron deficiency anemia, severe morbid obesity, CKD 4, recently admitted from 11/01/2024 through 11/03/2024 for AKI on CKD 4, presents to the ER due to sudden onset shortness of breath tonight.  Her son noticed that she was having increased work of breathing and called EMS.  Upon EMS arrival, the patient was saturating 82% on room air.  She was placed on nonrebreather and brought to the ER for further evaluation.   Clinical Impression  Patient presents lethargic and non-verbal, but after sitting up at bedside for a 4-5 minutes became response and able to ask questions such as why she was in hospital. Patient had difficulty gripping RW with her hands due to weakness and unable to fully stand after repeated attempts of sit to stands. Patient put back to bed with [facility] raised - RN aware. Patient will benefit from continued skilled physical therapy in hospital and recommended venue below to increase strength, balance, endurance for safe ADLs and gait.         If plan is discharge home, recommend the following: A lot of help with bathing/dressing/bathroom;A lot of help with walking and/or transfers;Help with stairs or ramp for entrance;Assist for transportation;Assistance with cooking/housework   Can travel by private vehicle   No    Equipment Recommendations None recommended by PT  Recommendations for Other Services       Functional Status Assessment Patient has had a recent decline in their functional status and demonstrates the ability to make significant improvements in function in a reasonable and predictable amount of time.      Precautions / Restrictions Precautions Precautions: Fall Recall of Precautions/Restrictions: Impaired Restrictions Weight Bearing Restrictions Per Provider Order: No      Mobility  Bed Mobility Overal bed mobility: Needs Assistance Bed Mobility: Supine to Sit, Sit to Supine     Supine to sit: HOB elevated, Max assist Sit to supine: Max assist   General bed mobility comments: slow labored movement with diffiuclty moving BLE due to weakness    Transfers Overall transfer level: Needs assistance Equipment used: Rolling walker (2 wheels) Transfers: Sit to/from Stand Sit to Stand: Max assist, Total assist   Step pivot transfers: +2 physical assistance       General transfer comment: Max/total 2 person assist to partially lift bottom off bed    Ambulation/Gait                  Stairs            Wheelchair Mobility     Tilt Bed    Modified Rankin (Stroke Patients Only)       Balance Overall balance assessment: Needs assistance Sitting-balance support: Feet supported, No upper extremity supported Sitting balance-Leahy Scale: Poor Sitting balance - Comments: Poor at EOB; anterior lean.                                     Pertinent Vitals/Pain Pain Assessment Pain Assessment: No/denies pain    Home Living Family/patient expects to be discharged to:: Private residence Living Arrangements: Alone Available Help at Discharge: Family;Available PRN/intermittently Type of Home: Apartment  Home Access: Level entry       Home Layout: One level Home Equipment: Agricultural Consultant (2 wheels);Cane - quad;Shower seat;Grab bars - tub/shower;Rollator (4 wheels) Additional Comments: Information taken from recent admission.    Prior Function Prior Level of Function : Needs assist;History of Falls (last six months)       Physical Assist : ADLs (physical);Mobility (physical) Mobility (physical): Gait;Bed mobility;Transfers;Stairs ADLs  (physical): IADLs Mobility Comments: community ambulator with SPC or rollator, 2 falls in last 6 months, does not drive ADLs Comments: Independent ADL's; IADL assist.     Extremity/Trunk Assessment   Upper Extremity Assessment Upper Extremity Assessment: Defer to OT evaluation    Lower Extremity Assessment Lower Extremity Assessment: Generalized weakness    Cervical / Trunk Assessment Cervical / Trunk Assessment: Kyphotic  Communication   Communication Communication: Impaired Factors Affecting Communication: Difficulty expressing self    Cognition Arousal: Lethargic Behavior During Therapy: Flat affect                           PT - Cognition Comments: Patient less lethargic after sitting up at bedside Following commands: Impaired Following commands impaired: Follows one step commands with increased time, Follows one step commands inconsistently     Cueing Cueing Techniques: Verbal cues, Visual cues, Gestural cues, Tactile cues     General Comments General comments (skin integrity, edema, etc.): RN notified that pt was desaturating to ~81%. RN adjusted flow rate.    Exercises     Assessment/Plan    PT Assessment Patient needs continued PT services  PT Problem List Decreased strength;Decreased activity tolerance;Decreased balance;Decreased mobility       PT Treatment Interventions DME instruction;Balance training;Gait training;Therapeutic activities;Therapeutic exercise;Functional mobility training;Patient/family education    PT Goals (Current goals can be found in the Care Plan section)  Acute Rehab PT Goals Patient Stated Goal: not stated PT Goal Formulation: With patient Time For Goal Achievement: 11/29/24 Potential to Achieve Goals: Good    Frequency Min 3X/week     Co-evaluation PT/OT/SLP Co-Evaluation/Treatment: Yes Reason for Co-Treatment: Complexity of the patient's impairments (multi-system involvement);To address functional/ADL  transfers PT goals addressed during session: Mobility/safety with mobility;Balance;Proper use of DME OT goals addressed during session: ADL's and self-care       AM-PAC PT 6 Clicks Mobility  Outcome Measure Help needed turning from your back to your side while in a flat bed without using bedrails?: A Lot Help needed moving from lying on your back to sitting on the side of a flat bed without using bedrails?: A Lot Help needed moving to and from a bed to a chair (including a wheelchair)?: Total Help needed standing up from a chair using your arms (e.g., wheelchair or bedside chair)?: Total Help needed to walk in hospital room?: Total Help needed climbing 3-5 steps with a railing? : Total 6 Click Score: 8    End of Session Equipment Utilized During Treatment: Gait belt Activity Tolerance: Patient tolerated treatment well;Patient limited by fatigue;Patient limited by lethargy Patient left: in bed;with call bell/phone within reach Nurse Communication: Mobility status PT Visit Diagnosis: Unsteadiness on feet (R26.81);History of falling (Z91.81);Other abnormalities of gait and mobility (R26.89);Difficulty in walking, not elsewhere classified (R26.2)    Time: 0820-0850 PT Time Calculation (min) (ACUTE ONLY): 30 min   Charges:   PT Evaluation $PT Eval Moderate Complexity: 1 Mod PT Treatments $Therapeutic Activity: 23-37 mins PT General Charges $$ ACUTE PT VISIT: 1 Visit  2:08 PM, 11/15/24 Lynwood Music, MPT Physical Therapist with [facility] [phone] office [phone] mobile phone

## 2024-11-15 NOTE — Progress Notes (Signed)
 " PROGRESS NOTE    Diana Mathews  FMW:[id number] DOB: July 16, 1951 DOA: 11/14/2024 PCP: Zollie Lowers, MD   Brief Narrative:    Diana Mathews is a 74 y.o. female with medical history significant for metastatic breast cancer with mets to the bone on letrozole , paroxysmal A-fib on Eliquis , type 2 diabetes, hypertension, hyperlipidemia, iron deficiency anemia, severe morbid obesity, CKD 4, recently admitted from 11/01/2024 through 11/03/2024 for AKI on CKD 4, presents to the ER due to sudden onset shortness of breath tonight.  Her son noticed that she was having increased work of breathing and called EMS.  Upon EMS arrival, the patient was saturating 82% on room air.  She was placed on nonrebreather and brought to the ER for further evaluation.   In the ER, tachycardic and tachypneic.  The patient was placed on BiPAP due to increased work of breathing.  Imaging revealed multifocal pneumonia within the bilateral lower lobes, lingula, right middle lobe and inferior right upper lobe, airspace consolidation is most significant in the left lower lobe.   The patient received empiric IV antibiotics, IV Rocephin  and IV doxycycline .  TRH, hospitalist service, was asked to admit.   ED Course: Temperature 99.3.  BP 131/57, pulse 81, respiration rate 18 blood, O2 saturation 99% on BiPAP 50% FiO2.  Admitted for hypoxia, pneumonia, AKI on CKD, volume overload, hyponatremia, hypernatremia.  Assessment & Plan:   Principal Problem:   Acute hypoxic respiratory failure (HCC) Active Problems:   Essential hypertension   Fibrillation, atrial (HCC)   Malignant neoplasm metastatic to bone (HCC)   AKI (acute kidney injury)   Acute hypoxic respiratory failure secondary to multifocal pneumonia, POA Not on oxygen supplementation at baseline. Reportedly with O2 saturation of 82% on room air, per EMS assessment. Currently on BiPAP, wean off as tolerated. Continue to treat underlying conditions Continue Rocephin  and  IV doxycycline  DuoNebs every 6 hours DuoNebs every 2 hours as needed for wheezing or shortness of breath   Elevated troponin in the setting of the above, suspect demand ischemia High sensitive troponin flat 25, 25 No evidence of acute ischemia on 12 lead EKG Monitor on telemetry Echocardiogram, pending   Hyponatremia, POA Presented with serum sodium of 117 and glucose of 354.  Corrected sodium 121, sodium 123 today.   Will consult nephrology given multiple other issues including hyperkalemia as well as AKI, metabolic acidosis  Hyperkalemia Potassium is still high 5.7 despite insulin , dextrose  and gluconate, Lokelma . nephrology consult as above  Acute on CKD 4, mild anion gap acidosis -Nephrology consult. - Resume Lasix  today. -Will monitor labs closely.  Avoid nephrotoxins  Type 2 diabetes with hyperglycemia Presented with serum glucose of 354. Hemoglobin A1c 8.3 on 11/01/2024. Insulin  coverage in place.   Paroxysmal A-fib on Eliquis  Resume home Eliquis   Currently rate controlled Continue to monitor on telemetry.   Anemia of chronic disease Hemoglobin 9.4 No reported overt bleeding Continue to monitor H&H.   Obesity BMI 44 Recommend weight loss outpatient with regular physical activity and healthy dieting.   Metastatic breast cancer with bone metastasis Currently on letrozole , prior to admission Close follow-up appointment with oncology.      DVT prophylaxis: On Eliquis .   Code Status: Full code.   Family Communication:    Disposition Plan: Admitted to stepdown unit.   Consults called: None.   Admission status: Inpatient status.     Status is: Inpatient The patient requires at least 2 midnight for further evaluation and treatment of present condition.  Subjective:  Patient is on a BiPAP 40% FiO2.  Alert and follows commands.  Objective: Vitals:   11/15/24 1100 11/15/24 1112 11/15/24 1128 11/15/24 1200  BP: (!) 127/46   (!) 108/52  Pulse: 79  77  81  Resp: 12  11 10   Temp:  100 F (37.8 C)    TempSrc:  Axillary    SpO2: 98%  99% 99%  Weight:      Height:        Intake/Output Summary (Last 24 hours) at 11/15/2024 1230 Last data filed at 11/15/2024 1122 Gross per 24 hour  Intake 762.81 ml  Output 550 ml  Net 212.81 ml   Filed Weights   11/14/24 2020 11/14/24 2356  Weight: 107.5 kg 113.8 kg    Examination:  General:alert, on BiPAP Chest: Bilateral crackles, no wheezing  CVS S1, S2, no murmur, regular rhythm Abdomen: Soft, nontender Extremities: Bilateral lower extremity edema   Data Reviewed: I have personally reviewed following labs and imaging studies  CBC: Recent Labs  Lab 11/14/24 2039  WBC 5.9  NEUTROABS 4.7  HGB 9.4*  HCT 26.9*  MCV 95.1  PLT 223   Basic Metabolic Panel: Recent Labs  Lab 11/14/24 2041 11/14/24 2242 11/15/24 0240 11/15/24 0702 11/15/24 0941  NA 118* 117* 121* 123* 123*  K 6.0* 5.7* 5.3* 5.4* 5.5*  CL 87* 85* 89* 88* 90*  CO2 17* 17* 17* 19* 17*  GLUCOSE 286* 354* 170* 172* 190*  BUN 30* 29* 30* 29* 31*  CREATININE 2.39* 2.75* 2.84* 3.00* 2.95*  CALCIUM  7.5* 8.1* 8.0* 7.9* 7.9*   GFR: Estimated Creatinine Clearance: 21.3 mL/min (A) (by C-G formula based on SCr of 2.95 mg/dL (H)). Liver Function Tests: Recent Labs  Lab 11/15/24 0941  ALBUMIN 3.6   No results for input(s): LIPASE, AMYLASE in the last 168 hours. No results for input(s): AMMONIA in the last 168 hours. Coagulation Profile: No results for input(s): INR, PROTIME in the last 168 hours. Cardiac Enzymes: No results for input(s): CKTOTAL, CKMB, CKMBINDEX, TROPONINI in the last 168 hours. BNP (last 3 results) Recent Labs    11/14/24 2039  PROBNP 814.0*   HbA1C: No results for input(s): HGBA1C in the last 72 hours. CBG: Recent Labs  Lab 11/14/24 2305 11/15/24 0101 11/15/24 0333 11/15/24 0730 11/15/24 1112  GLUCAP 331* 160* 167* 150* 196*   Lipid Profile: No results for  input(s): CHOL, HDL, LDLCALC, TRIG, CHOLHDL, LDLDIRECT in the last 72 hours. Thyroid  Function Tests: No results for input(s): TSH, T4TOTAL, FREET4, T3FREE, THYROIDAB in the last 72 hours. Anemia Panel: No results for input(s): VITAMINB12, FOLATE, FERRITIN, TIBC, IRON, RETICCTPCT in the last 72 hours. Sepsis Labs: No results for input(s): PROCALCITON, LATICACIDVEN in the last 168 hours.  Recent Results (from the past 240 hours)  Resp panel by RT-PCR (RSV, Flu A&B, Covid) Anterior Nasal Swab     Status: None   Collection Time: 11/14/24  8:39 PM   Specimen: Anterior Nasal Swab  Result Value Ref Range Status   SARS Coronavirus 2 by RT PCR NEGATIVE NEGATIVE Final    Comment: (NOTE) SARS-CoV-2 target nucleic acids are NOT DETECTED.  The SARS-CoV-2 RNA is generally detectable in upper respiratory specimens during the acute phase of infection. The lowest concentration of SARS-CoV-2 viral copies this assay can detect is 138 copies/mL. A negative result does not preclude SARS-Cov-2 infection and should not be used as the sole basis for treatment or other patient management decisions. A negative result may occur  with  improper specimen collection/handling, submission of specimen other than nasopharyngeal swab, presence of viral mutation(s) within the areas targeted by this assay, and inadequate number of viral copies(<138 copies/mL). A negative result must be combined with clinical observations, patient history, and epidemiological information. The expected result is Negative.  Fact Sheet for Patients:  bloggercourse.com  Fact Sheet for Healthcare Providers:  seriousbroker.it  This test is no t yet approved or cleared by the [place]  FDA and  has been authorized for detection and/or diagnosis of SARS-CoV-2 by FDA under an Emergency Use Authorization (EUA). This EUA will remain  in effect (meaning  this test can be used) for the duration of the COVID-19 declaration under Section 564(b)(1) of the Act, 21 U.S.C.section 360bbb-3(b)(1), unless the authorization is terminated  or revoked sooner.       Influenza A by PCR NEGATIVE NEGATIVE Final   Influenza B by PCR NEGATIVE NEGATIVE Final    Comment: (NOTE) The Xpert Xpress SARS-CoV-2/FLU/RSV plus assay is intended as an aid in the diagnosis of influenza from Nasopharyngeal swab specimens and should not be used as a sole basis for treatment. Nasal washings and aspirates are unacceptable for Xpert Xpress SARS-CoV-2/FLU/RSV testing.  Fact Sheet for Patients: bloggercourse.com  Fact Sheet for Healthcare Providers: seriousbroker.it  This test is not yet approved or cleared by the [place]  FDA and has been authorized for detection and/or diagnosis of SARS-CoV-2 by FDA under an Emergency Use Authorization (EUA). This EUA will remain in effect (meaning this test can be used) for the duration of the COVID-19 declaration under Section 564(b)(1) of the Act, 21 U.S.C. section 360bbb-3(b)(1), unless the authorization is terminated or revoked.     Resp Syncytial Virus by PCR NEGATIVE NEGATIVE Final    Comment: (NOTE) Fact Sheet for Patients: bloggercourse.com  Fact Sheet for Healthcare Providers: seriousbroker.it  This test is not yet approved or cleared by the [place]  FDA and has been authorized for detection and/or diagnosis of SARS-CoV-2 by FDA under an Emergency Use Authorization (EUA). This EUA will remain in effect (meaning this test can be used) for the duration of the COVID-19 declaration under Section 564(b)(1) of the Act, 21 U.S.C. section 360bbb-3(b)(1), unless the authorization is terminated or revoked.  Performed at [facility], [address] [place], [place] [address]   MRSA Next Gen by PCR, Nasal      Status: Abnormal   Collection Time: 11/15/24 12:06 AM   Specimen: Nasal Mucosa; Nasal Swab  Result Value Ref Range Status   MRSA by PCR Next Gen DETECTED (A) NOT DETECTED Final    Comment: RESULT CALLED TO, READ BACK BY AND VERIFIED WITH: N RONE,RN@[id number]  11/15/24 MK (NOTE) The GeneXpert MRSA Assay (FDA approved for NASAL specimens only), is one component of a comprehensive MRSA colonization surveillance program. It is not intended to diagnose MRSA infection nor to guide or monitor treatment for MRSA infections. Test performance is not FDA approved in patients less than 25 years old. Performed at [facility], [address] [place], [place] [address]          Radiology Studies: DG CHEST PORT 1 VIEW Result Date: 11/15/2024 EXAM: 1 VIEW(S) XRAY OF THE CHEST 11/15/2024 06:38:00 AM COMPARISON: Portable chest 11/14/2024, chest CT without contrast 11/14/2024 at 9:27 pm. CLINICAL HISTORY: Respiratory compromise. FINDINGS: LUNGS AND PLEURA: Patchy consolidation in the left greater than right lower lobes continues to be seen. Other less dense airspace disease noted on CT in the right upper lobe is not  radiographically visible. The overall Aeration seems unchanged No pleural effusion. No pneumothorax. HEART AND MEDIASTINUM: There is mild cardiomegaly. Aortic atherosclerosis with normal mediastinal configuration. BONES AND SOFT TISSUES: Thoracic spondylosis. IMPRESSION: 1. Persistent patchy consolidation in the left greater than right lower lobes. 2. Mild cardiomegaly. Electronically signed by: Francis Quam MD 11/15/2024 07:05 AM EST RP Workstation: [id number]   CT Head Wo Contrast Result Date: 11/14/2024 EXAM: CT HEAD WITHOUT CONTRAST 11/14/2024 09:31:33 PM TECHNIQUE: CT of the head was performed without the administration of intravenous contrast. Automated exposure control, iterative reconstruction, and/or weight based adjustment of the mA/kV was utilized to reduce the radiation dose to as low as  reasonably achievable. COMPARISON: 11/01/2024 CLINICAL HISTORY: ams ams FINDINGS: BRAIN AND VENTRICLES: No acute hemorrhage. No evidence of acute infarct. No hydrocephalus. No extra-axial collection. No mass effect or midline shift. Patchy low density throughout the deep white matter, likely chronic small vessel disease, stable. ORBITS: No acute abnormality. SINUSES: No acute abnormality. SOFT TISSUES AND SKULL: No acute soft tissue abnormality. No skull fracture. IMPRESSION: 1. No acute intracranial abnormality. 2. Stable patchy chronic small vessel disease. Electronically signed by: Franky Crease MD 11/14/2024 09:40 PM EST RP Workstation: [id number]   CT Chest Wo Contrast Result Date: 11/14/2024 EXAM: CT CHEST WITHOUT CONTRAST 11/14/2024 09:32:16 PM TECHNIQUE: CT of the chest was performed without the administration of intravenous contrast. Multiplanar reformatted images are provided for review. Automated exposure control, iterative reconstruction, and/or weight based adjustment of the mA/kV was utilized to reduce the radiation dose to as low as reasonably achievable. COMPARISON: CT angiogram chest 10/32/2023. No images were available for direct comparison. CLINICAL HISTORY: sob sob FINDINGS: MEDIASTINUM: The heart is mildly enlarged. Coronary and aortic atherosclerotic calcifications are noted. The central airways are clear. LYMPH NODES: No mediastinal, hilar or axillary lymphadenopathy. LUNGS AND PLEURA: Mild patchy multifocal airspace opacities are seen in the bilateral lower lobes, lingula and right middle lobe. There are also minimal patchy ground glass opacities in the inferior right upper lobe. Airspace consolidation is the most significant in the left lower lobe. No pleural effusion or pneumothorax. SOFT TISSUES/BONES: Mildly sclerotic changes are seen in the T11 vertebral body and within multiple ribs as mentioned on prior scan. UPPER ABDOMEN: Limited images of the upper abdomen demonstrate likely  numerous ill-defined hypodense liver lesions measuring up to 3.6 cm, indeterminate. IMPRESSION: 1. Multifocal pneumonia within the bilateral lower lobes, lingula, right middle lobe, and inferior right upper lobe; airspace consolidation is most significant in the left lower lobe. 2. Mild cardiomegaly. 3. Numerous ill-defined hypodense liver lesions measuring up to 3.6 cm, indeterminate; recommend further evaluation with contrast-enhanced MRI or multiphase CT of the liver. 4. Indeterminate sclerotic osseous lesions involving the T11 vertebral body and multiple ribs. Electronically signed by: Greig Pique MD 11/14/2024 09:40 PM EST RP Workstation: [id number]   CT Cervical Spine Wo Contrast Result Date: 11/14/2024 EXAM: CT CERVICAL SPINE WITHOUT CONTRAST 11/14/2024 09:31:33 PM TECHNIQUE: CT of the cervical spine was performed without the administration of intravenous contrast. Multiplanar reformatted images are provided for review. Automated exposure control, iterative reconstruction, and/or weight based adjustment of the mA/kV was utilized to reduce the radiation dose to as low as reasonably achievable. COMPARISON: None available. CLINICAL HISTORY: ams, hit head, fell FINDINGS: BONES AND ALIGNMENT: Diffuse sclerosis throughout the C5 vertebral body. Sclerotic focus in the posterior elements at C7. Sclerosis within the mid left clavicle. Findings compatible with sclerotic metastases. No acute fracture or traumatic malalignment. DEGENERATIVE CHANGES: Moderate bilateral degenerative facet  disease diffusely. Degenerative disc disease at C5-C6 and C6-C7. SOFT TISSUES: No prevertebral soft tissue swelling. IMPRESSION: 1. 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 Electronically signed by: Franky Crease MD 11/14/2024 09:39 PM EST RP Workstation: [id number]   DG Chest 1 View Result Date: 11/14/2024 CLINICAL DATA:  Shortness of breath.  EXAM: CHEST  1 VIEW COMPARISON:  Chest radiograph dated 06/12/2018. FINDINGS: There is mild eventration of the right hemidiaphragm. No focal consolidation, pleural effusion or pneumothorax. There is mild cardiomegaly with mild central vascular congestion. No acute osseous pathology. IMPRESSION: Mild cardiomegaly with mild central vascular congestion. No focal consolidation. Electronically Signed   By: Vanetta Chou M.D.   On: 11/14/2024 20:54        Scheduled Meds:  apixaban   5 mg Oral BID   Chlorhexidine  Gluconate Cloth  6 each Topical Daily   insulin  aspart  0-20 Units Subcutaneous Q4H   ipratropium-albuterol   3 mL Nebulization Q6H   letrozole   2.5 mg Oral Daily   mupirocin  ointment  1 Application Nasal BID   sodium zirconium cyclosilicate   10 g Oral Once   Continuous Infusions:  sodium chloride  10 mL/hr at 11/15/24 1122   cefTRIAXone  (ROCEPHIN )  IV Stopped (11/15/24 0836)   doxycycline  (VIBRAMYCIN ) IV 125 mL/hr at 11/15/24 1122          Derryl Duval, MD [facility] 11/15/2024, 12:30 PM   "

## 2024-11-15 NOTE — Plan of Care (Signed)
" °  Problem: Acute Rehab OT Goals (only OT should resolve) Goal: Pt. Will Perform Eating Flowsheets (Taken 11/15/2024 1118) Pt Will Perform Eating:  with modified independence  sitting Goal: Pt. Will Perform Grooming Flowsheets (Taken 11/15/2024 1118) Pt Will Perform Grooming:  with contact guard assist  sitting Goal: Pt. Will Perform Upper Body Bathing Flowsheets (Taken 11/15/2024 1118) Pt Will Perform Upper Body Bathing:  with contact guard assist  sitting Goal: Pt. Will Perform Upper Body Dressing Flowsheets (Taken 11/15/2024 1118) Pt Will Perform Upper Body Dressing:  with min assist  with adaptive equipment  sitting Goal: Pt. Will Perform Lower Body Dressing Flowsheets (Taken 11/15/2024 1118) Pt Will Perform Lower Body Dressing:  with min assist  with adaptive equipment  sitting/lateral leans Goal: Pt. Will Transfer To Toilet Flowsheets (Taken 11/15/2024 1118) Pt Will Transfer to Toilet:  with min assist  ambulating  stand pivot transfer Goal: Pt. Will Perform Toileting-Clothing Manipulation Flowsheets (Taken 11/15/2024 1118) Pt Will Perform Toileting - Clothing Manipulation and hygiene:  with contact guard assist  with min assist  sitting/lateral leans  bed level Goal: Pt/Caregiver Will Perform Home Exercise Program Flowsheets (Taken 11/15/2024 1118) Pt/caregiver will Perform Home Exercise Program:  Increased strength  Increased ROM  Both right and left upper extremity  Independently  Dietrich Ke OT, MOT  "

## 2024-11-15 NOTE — Progress Notes (Signed)
 Echocardiogram 2D Echocardiogram has been performed.  Diana Mathews 11/15/2024, 10:32 AM

## 2024-11-15 NOTE — Evaluation (Signed)
 Occupational Therapy Evaluation Patient Details Name: Diana Mathews MRN: [id number] DOB: Jun 26, 1951 Today's Date: 11/15/2024   History of Present Illness   Diana Mathews is a 74 y.o. female with medical history significant for metastatic breast cancer with mets to the bone on letrozole , paroxysmal A-fib on Eliquis , type 2 diabetes, hypertension, hyperlipidemia, iron deficiency anemia, severe morbid obesity, CKD 4, recently admitted from 11/01/2024 through 11/03/2024 for AKI on CKD 4, presents to the ER due to sudden onset shortness of breath tonight.  Her son noticed that she was having increased work of breathing and called EMS.  Upon EMS arrival, the patient was saturating 82% on room air.  She was placed on nonrebreather and brought to the ER for further evaluation. (per DO)     Clinical Impressions Pt lethargic but did show increased arousal when assisted to sitting at EOB. Pt was intermittently vocal once sitting and at the end when back in bed. Pt demonstrates significant general weakness. Little functional use of B UE today. Pt unable to stand and demonstrated poor seated balance at EOB with anterior lean. Pt left in the bed with call bell within reach. RN made aware of desaturation and came into the room and adjusted flow rate on nasal cannula. Pt will benefit from continued OT in the hospital to increase strength, balance, and endurance for safe ADL's.        If plan is discharge home, recommend the following:   Two people to help with walking and/or transfers;A lot of help with bathing/dressing/bathroom;Assistance with cooking/housework;Assistance with feeding;Direct supervision/assist for medications management;Assist for transportation;Help with stairs or ramp for entrance     Functional Status Assessment   Patient has had a recent decline in their functional status and demonstrates the ability to make significant improvements in function in a reasonable and predictable  amount of time.     Equipment Recommendations   None recommended by OT             Precautions/Restrictions   Precautions Precautions: Fall Recall of Precautions/Restrictions: Impaired Restrictions Weight Bearing Restrictions Per Provider Order: No     Mobility Bed Mobility Overal bed mobility: Needs Assistance Bed Mobility: Supine to Sit, Sit to Supine     Supine to sit: HOB elevated, Max assist Sit to supine: Max assist   General bed mobility comments: Much assist; pt very lethargic but arousal level did increase some after sitting at EOB.    Transfers                   General transfer comment: Attempted sit to stand without success today. Very poor sitting balance at EOB.      Balance Overall balance assessment: Needs assistance Sitting-balance support: No upper extremity supported, Feet supported Sitting balance-Leahy Scale: Poor Sitting balance - Comments: Poor at EOB; anterior lean.                                   ADL either performed or assessed with clinical judgement   ADL Overall ADL's : Needs assistance/impaired Eating/Feeding: Maximal assistance;Total assistance   Grooming: Maximal assistance;Total assistance   Upper Body Bathing: Maximal assistance;Total assistance   Lower Body Bathing: Maximal assistance;Total assistance   Upper Body Dressing : Maximal assistance;Total assistance   Lower Body Dressing: Maximal assistance;Total assistance   Toilet Transfer: Maximal assistance;Total assistance Toilet Transfer Details (indicate cue type and reason): Unable to stand today; poor  sitting balance. Toileting- Clothing Manipulation and Hygiene: Maximal assistance;Total assistance   Tub/ Shower Transfer: Maximal assistance;Total assistance   Functional mobility during ADLs: Maximal assistance;Total assistance General ADL Comments: Limited by arousal level today.     Vision Baseline Vision/History: 1 Wears  glasses Ability to See in Adequate Light: 0 Adequate Vision Assessment?:  (No obvious impairments today. More observation needed.)     Perception Perception: Not tested       Praxis Praxis: Not tested       Pertinent Vitals/Pain Pain Assessment Pain Assessment: Faces Faces Pain Scale: No hurt     Extremity/Trunk Assessment Upper Extremity Assessment Upper Extremity Assessment: Difficult to assess due to impaired cognition (Pt appeared very weak with little functional use of B UE today.)   Lower Extremity Assessment Lower Extremity Assessment: Defer to PT evaluation   Cervical / Trunk Assessment Cervical / Trunk Assessment: Kyphotic   Communication Communication Communication: Impaired Factors Affecting Communication: Difficulty expressing self   Cognition Arousal: Lethargic Behavior During Therapy: Flat affect Cognition: Cognition impaired, Difficult to assess Difficult to assess due to: Level of arousal           OT - Cognition Comments: Pt lethargic and verbalizing sporadically.                 Following commands: Impaired Following commands impaired: Follows one step commands inconsistently     Cueing  General Comments   Cueing Techniques: Verbal cues;Visual cues;Gestural cues;Tactile cues  RN notified that pt was desaturating to ~81%. RN adjusted flow rate.              Home Living Family/patient expects to be discharged to:: Private residence Living Arrangements: Alone Available Help at Discharge: Family;Available PRN/intermittently Type of Home: Apartment Home Access: Level entry     Home Layout: One level     Bathroom Shower/Tub: Tub/shower unit;Sponge bathes at baseline   Bathroom Toilet: Handicapped height Bathroom Accessibility: Yes   Home Equipment: Agricultural Consultant (2 wheels);Cane - quad;Shower seat;Grab bars - tub/shower;Rollator (4 wheels)   Additional Comments: Taken from recent admission.      Prior  Functioning/Environment Prior Level of Function : Needs assist;History of Falls (last six months) (Pt lethargic and unable to report on prior function. Information taken from recent admission.)       Physical Assist : ADLs (physical);Mobility (physical) Mobility (physical): Gait ADLs (physical): IADLs Mobility Comments: Reports as community ambulator with [facility] or rollator, 2 falls in last 6 months, does not drive (per chart review) ADLs Comments: Independent ADL's; IADL assist. (per chart review)    OT Problem List: Decreased strength;Decreased range of motion;Decreased activity tolerance;Impaired balance (sitting and/or standing);Decreased cognition;Decreased safety awareness;Decreased knowledge of use of DME or AE;Impaired tone;Impaired UE functional use   OT Treatment/Interventions: Self-care/ADL training;Therapeutic exercise;DME and/or AE instruction;Energy conservation;Therapeutic activities;Patient/family education;Balance training      OT Goals(Current goals can be found in the care plan section)   Acute Rehab OT Goals Patient Stated Goal: None stated. OT Goal Formulation: Patient unable to participate in goal setting Time For Goal Achievement: 11/29/24 Potential to Achieve Goals: Fair   OT Frequency:  Min 2X/week    Co-evaluation PT/OT/SLP Co-Evaluation/Treatment: Yes Reason for Co-Treatment: Complexity of the patient's impairments (multi-system involvement);To address functional/ADL transfers   OT goals addressed during session: ADL's and self-care                       End of Session Equipment Utilized During Treatment: Rolling  walker (2 wheels);Oxygen (Pt on BIPAP initially but RN removed and placed pt on supplemental O2 via nasal cannula.)  Activity Tolerance: Patient limited by lethargy Patient left: in bed;with call bell/phone within reach  OT Visit Diagnosis: Unsteadiness on feet (R26.81);Other abnormalities of gait and mobility (R26.89);Muscle weakness  (generalized) (M62.81);History of falling (Z91.81);Other symptoms and signs involving cognitive function                Time: 9166-9145 OT Time Calculation (min): 21 min Charges:  OT General Charges $OT Visit: 1 Visit OT Evaluation $OT Eval Low Complexity: 1 Low  Lemarcus Baggerly OT, MOT   Jayson Person 11/15/2024, 11:16 AM

## 2024-11-15 NOTE — TOC Initial Note (Addendum)
 Transition of Care [facility]) - Initial/Assessment Note    Patient Details  Name: Diana Mathews MRN: [id number] Date of Birth: 1951/04/18  Transition of [facility]) CM/SW Contact:    Noreen KATHEE Cleotilde ISRAEL Phone Number: 11/15/2024, 10:47 AM  Clinical Narrative:                  Patient only orient to self. CSW called daughter Diana Mathews to assess patient and discuss PT recommendation for SNF. Daughter reports that patient lives alone and has a rollator, but rarely used it. She reports that patient is suppose to be using a CPAP machine but does not have one. She reports that when she spoke with patient , patient told her that she had been at the hospital for 3-days. Daughter is agreeable to SNF and request for [facility] to be first choice, but was open to other facilities in [place] and [place]. Process for SNF has been started.   Addendum 2:52 pm  CSW spoke with patient daughter Diana Mathews at bedside about [facility] being able to offer patient a bed- daughter agreeable and accepted bed. [facility] has been started.   Expected Discharge Plan: Skilled Nursing Facility Barriers to Discharge: Continued Medical Work up   Patient Goals and CMS Choice Patient states their goals for this hospitalization and ongoing recovery are:: short-term rehab CMS Medicare.gov Compare Post Acute Care list provided to:: Patient Represenative (must comment) (daughter Diana Mathews) Choice offered to / list presented to : Adult Children      Expected Discharge Plan and Services In-house Referral: Clinical Social Work Discharge Planning Services: CM Consult Post Acute Care Choice: Durable Medical Equipment Living arrangements for the past 2 months: Apartment                                      Prior Living Arrangements/Services Living arrangements for the past 2 months: Apartment Lives with:: Self Patient language and need for interpreter reviewed:: Yes Do you feel safe going back to the place where you live?: No   Daughter  expressed not being able to care for patient and she lives alone  Need for Family Participation in Patient Care: Yes (Comment) Care giver support system in place?: No (comment) Current home services: DME Criminal Activity/Legal Involvement Pertinent to Current Situation/Hospitalization: No - Comment as needed  Activities of Daily Living   ADL Screening (condition at time of admission) Independently performs ADLs?: No Does the patient have a NEW difficulty with bathing/dressing/toileting/self-feeding that is expected to last >3 days?: Yes (Initiates electronic notice to provider for possible OT consult) Does the patient have a NEW difficulty with getting in/out of bed, walking, or climbing stairs that is expected to last >3 days?: Yes (Initiates electronic notice to provider for possible PT consult) Does the patient have a NEW difficulty with communication that is expected to last >3 days?: No Is the patient deaf or have difficulty hearing?: No Does the patient have difficulty seeing, even when wearing glasses/contacts?: No Does the patient have difficulty concentrating, remembering, or making decisions?: Yes  Permission Sought/Granted      Share Information with NAME: Diana Mathews     Permission granted to share info w Relationship: Daughter     Emotional Assessment   Attitude/Demeanor/Rapport: Unable to Assess Affect (typically observed): Unable to Assess Orientation: : Oriented to Self Alcohol  / Substance Use: Not Applicable Psych Involvement: No (comment)  Admission diagnosis:  Hyperkalemia [E87.5]  Hyponatremia [E87.1] AKI (acute kidney injury) [N17.9] Acute respiratory failure with hypoxia and hypercapnia (HCC) [J96.01, J96.02] Altered mental status, unspecified altered mental status type [R41.82] Multifocal pneumonia [J18.8] Acute hypoxic respiratory failure (HCC) [J96.01] Patient Active Problem List   Diagnosis Date Noted   Acute hypoxic respiratory failure (HCC) 11/14/2024    AKI (acute kidney injury) 11/01/2024   Morbid obesity (HCC) 10/06/2024   Diabetes mellitus treated with injections of non-insulin  medication (HCC) 07/30/2023   Stress incontinence 04/20/2023   Chemotherapy induced neutropenia 03/20/2022   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   Lymphocytosis 02/29/2020   Iron deficiency 02/07/2020   Malignant neoplasm metastatic to bone (HCC) 11/11/2019   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 [place] [address] Phone: [phone] Fax: [phone]     Social Drivers of Health (SDOH) Social History: SDOH Screenings   Food Insecurity: Patient Unable To Answer (11/15/2024)  Recent Concern: Food Insecurity - Food Insecurity Present (10/05/2024)  Housing: Unknown (11/15/2024)  Transportation Needs: Patient Unable To Answer (11/15/2024)  Recent Concern: Transportation Needs - Unmet Transportation Needs (10/05/2024)  Utilities: Patient Unable To Answer (11/15/2024)  Alcohol  Screen: Low Risk (07/04/2024)  Depression (PHQ2-9): Medium Risk (10/06/2024)  Financial Resource Strain: Low Risk (10/05/2024)  Physical Activity: Sufficiently Active (10/05/2024)  Social Connections: Unknown (11/15/2024)  Recent Concern: Social Connections - Socially Isolated (11/01/2024)  Stress: Stress Concern Present (10/05/2024)  Tobacco Use: Medium Risk (11/14/2024)  Health Literacy: Adequate  Health Literacy (07/04/2024)   SDOH Interventions:     Readmission Risk Interventions    11/15/2024   10:43 AM 11/03/2024   10:11 AM  Readmission Risk Prevention Plan  Medication Screening  Complete  Transportation Screening Complete Complete  Home Care Screening Complete   Medication Review (RN CM) Complete

## 2024-11-15 NOTE — Consult Note (Addendum)
 [facility] Nephrology Consultation Note  Requesting MD: Dr. Mcarthur, Derryl Reason for consult: AKI, hyponatremia  HPI:  Diana Mathews is a 74 y.o. 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 The patient was brought into the ER due to shortness onset of shortness of breath.  Her son noticed that she was having increased work of breathing therefore EMS was called.  Initially her oxygen saturation was 82% on room air.  She was placed on nonrebreather and brought to the ER. In the ER she was tachycardic, tachypneic.  Placed on BiPAP.  She was diagnosed with multifocal pneumonia therefore treated with empiric antibiotics including doxycycline  and Rocephin . The labs showed initial sodium level of 118, potassium 6.0, CO2 17, BUN 30, creatinine level 2.39.  Received IV fluid in the beginning followed by a dose of IV Lasix .  The labs this morning showed sodium level of 123, potassium 5.4, CO2 19 and creatinine level of 3.0.  UA with UTI.  Apparently she had a acute urinary retention, bladder scan showing 576 cc therefore Foley catheter was inserted. The patient is still having shortness of breath, hypoxic on BiPAP.  Feels weak and tired.  No chest pain, nausea or vomiting.  PMHx:   Past Medical History:  Diagnosis Date   Atrial fibrillation (HCC) 06/12/2018   Depression    Essential hypertension    Headache    Hyperlipidemia    Metastatic breast cancer    Left breast, metastatic to bone and with pulmonary nodules - [facility]   Osteoarthritis    Sleep apnea    TIA (transient ischemic attack)    Type 2 diabetes mellitus (HCC)     Past Surgical History:  Procedure Laterality Date   ABDOMINAL HYSTERECTOMY     CHOLECYSTECTOMY     FOOT SURGERY Left    HERNIA REPAIR      TUBAL LIGATION     WRIST SURGERY Bilateral    Carpal Tunnel    Family Hx:  Family History  Problem Relation Age of Onset   Arthritis Mother    COPD Mother    Diabetes Mother    Hypertension Mother    Stroke Mother    Arthritis Sister    Diabetes Brother    Arthritis Sister    Diabetes Sister    Diabetes Sister    Anxiety disorder Sister    Depression Sister    Diabetes Brother    Mental illness Brother    Obesity Brother     Social History:  reports that she quit smoking about 39 years ago. Her smoking use included cigarettes. She has never used smokeless tobacco. She reports that she does not drink alcohol  and does not use drugs.  Allergies: Allergies[1]  Medications: Prior to Admission medications  Medication Sig Start Date End Date Taking? Authorizing Provider  apixaban  (ELIQUIS ) 5 MG TABS tablet Take 1 tablet (5 mg total) by mouth 2 (two) times daily. 02/24/24   Zollie Lowers, MD  ARIPiprazole  (ABILIFY ) 5 MG tablet Take 1 tablet (5 mg total) by mouth daily. 10/06/24   Zollie Lowers, MD  atorvastatin  (LIPITOR) 80 MG tablet TAKE ONE (1) TABLET BY MOUTH EVERY DAY 10/14/24   Zollie Lowers, MD  baclofen (LIORESAL) 10 MG tablet Take 10 mg by mouth 4 (four) times daily. 04/17/22   [provider]  Blood Glucose Monitoring Suppl (ACCU-CHEK AVIVA PLUS) w/Device KIT Use to check glucose before breakfast and 2 hours after supper 10/06/24   Zollie Lowers, MD  Blood Glucose Monitoring Suppl DEVI 1 each by Does not apply route in the morning, at noon, and at bedtime. May substitute to any manufacturer covered by patient's insurance. 04/28/23   Zollie Lowers, MD  busPIRone  (BUSPAR ) 10 MG tablet Take 1 tablet (10 mg total) by mouth 3 (three) times daily as needed. 10/06/24   Zollie Lowers, MD  carvedilol  (COREG ) 6.25 MG tablet TAKE 1 TABLET BY MOUTH TWICE DAILY WITH FOOD 09/16/24   Zollie Lowers, MD  diltiazem  (CARDIZEM  CD) 240 MG 24 hr capsule TAKE ONE (1) CAPSULE BY MOUTH 2  TIMES DAILY 09/16/24   Zollie Lowers, MD  diphenhydrAMINE (BENADRYL) 25 mg capsule Take 25 mg by mouth at bedtime as needed.    [provider]  DULoxetine  (CYMBALTA ) 60 MG capsule TAKE 1 CAPSULE BY MOUTH EVERY MORNING & AT BEDTIME 10/06/24   Zollie Lowers, MD  fluconazole (DIFLUCAN) 100 MG tablet Take 100 mg by mouth daily. 10/21/24   [provider]  gabapentin  (NEURONTIN ) 300 MG capsule Take 300 mg by mouth 3 (three) times daily. 05/19/24   [provider]  glucose blood (ONETOUCH VERIO) test strip CHECK MORNING, NOON, & BEDTIME Dx E11.8 04/18/24   Zollie Lowers, MD  HYDROcodone-acetaminophen  [facility]) 10-325 MG tablet Take 1 tablet by mouth every 6 (six) hours as needed.    [provider]  letrozole  (FEMARA ) 2.5 MG tablet Take 2.5 mg by mouth daily. 04/02/22   [provider]  meloxicam (MOBIC) 7.5 MG tablet Take 7.5 mg by mouth daily. 10/14/24   [provider]  metFORMIN  (GLUCOPHAGE -XR) 750 MG 24 hr tablet TAKE ONE (1) TABLET BY MOUTH TWO (2) TIMES DAILY 10/06/24   Zollie Lowers, MD  palbociclib [facility]) 125 MG tablet Take by mouth. 04/09/20   [provider]  promethazine  (PHENERGAN ) 25 MG tablet TAKE 1 TABLET BY MOUTH EVERY 6 HOURS AS NEEDED FOR NAUSEA & VOMITING 06/20/24   Zollie Lowers, MD  rOPINIRole  (REQUIP ) 1 MG tablet Take 1 tablet (1 mg total) by mouth at bedtime. 10/06/24   Zollie Lowers, MD  tirzepatide  (MOUNJARO ) 7.5 MG/0.5ML Pen Inject 7.5 mg into the skin once a week. 07/06/24   Zollie Lowers, MD  trazodone  (DESYREL ) 300 MG tablet TAKE 1/3 TO 1 TABLET AT BEDTIME AS NEEDED FOR SLEEP 09/18/24   Zollie Lowers, MD  TRUEplus Lancets 33G MISC Test BS QID Dx E11.8 08/20/21   Zollie Lowers, MD    I have reviewed the patient's current medications.  Labs: Renal Panel: Recent Labs  Lab 11/14/24 2041 11/14/24 2242 11/15/24 0240 11/15/24 0702  NA 118* 117* 121* 123*  K 6.0* 5.7* 5.3* 5.4*  CL 87* 85* 89* 88*  CO2  17* 17* 17* 19*  GLUCOSE 286* 354* 170* 172*  BUN 30* 29* 30* 29*  CREATININE 2.39* 2.75* 2.84* 3.00*  CALCIUM  7.5* 8.1* 8.0* 7.9*     CBC:    Latest Ref Rng & Units 11/14/2024    8:39 PM 11/02/2024    4:25 AM 11/01/2024    5:11 PM  CBC  WBC 4.0 - 10.5 K/uL 5.9  5.3  6.9   Hemoglobin 12.0 - 15.0 g/dL 9.4  89.9  88.8   Hematocrit 36.0 - 46.0 % 26.9  30.4  32.9   Platelets 150 - 400 K/uL 223  304  358  Anemia Panel:  Recent Labs    07/06/24 1509 10/06/24 1513 11/01/24 1711 11/01/24 1711 11/02/24 0425 11/14/24 2039  HGB 10.0* 10.1* 11.1*  --  10.0* 9.4*  MCV 104* 103* 100.0   < > 100.7* 95.1  VITAMINB12 543  --   --   --   --   --   FOLATE 12.5  --   --   --   --   --   FERRITIN 30  --   --   --   --   --   TIBC 397  --   --   --   --   --   IRON 57  --   --   --   --   --    < > = values in this interval not displayed.    No results for input(s): AST, ALT, ALKPHOS, BILITOT, PROT, ALBUMIN in the last 168 hours.  Lab Results  Component Value Date   HGBA1C 8.3 (H) 11/01/2024    ROS:  Pertinent items noted in HPI and remainder of comprehensive ROS otherwise negative.  Physical Exam: Vitals:   11/15/24 0900 11/15/24 0911  BP: (!) 161/62   Pulse: 93 87  Resp: 16 16  Temp:    SpO2: (!) 88% 98%     General exam: Ill looking female, short of breath. Respiratory system: Coarse crackle bilateral, mild increased work of breathing. Cardiovascular system: S1 & S2 heard, RRR.  Bilateral lower extremities pitting edema and dependent edema. Gastrointestinal system: Abdomen is nondistended, soft and nontender. Normal bowel sounds heard. Central nervous system: Alert awake and following commands.   Extremities: No cyanosis but has significant edema. Skin: No rashes, lesions or ulcers Psychiatry: Alert and awake.  Assessment/Plan:  # Acute hypoxic respiratory failure due to multifocal pneumonia: Requiring BiPAP and on broad-spectrum antibiotics per primary  team.  Ordered a dose of Lasix  to help with fluid.  # Acute kidney injury on CKD 3B presumed due to infection, fluid overload and acute renal retention.  UA with UTI on antibiotics.  Check kidney ultrasound.  Given short of breath and evidence of fluid overload, I will order a dose of Lasix  80 mg today.  She received 40 mg yesterday.  I will check serum albumin level. - Strict ins and outs and monitor lab. - No need for dialysis at this time.  # Hyponatremia, hypervolemic: Pending urine sodium and osmolality.  Sodium level improved with IV Lasix .  Ordering another dose of Lasix  today.  Follow lab.  # Hyperkalemia with AKI: Receiving medical treatment and getting diuretics to excrete potassium.  Thank you for the consult.  We will continue to follow.   Tylan Briguglio Amelie Romney 11/15/2024, 9:43 AM  [employer].       [1]  Allergies Allergen Reactions   Codeine Rash

## 2024-11-15 NOTE — Plan of Care (Signed)
" °  Problem: Acute Rehab PT Goals(only PT should resolve) Goal: Pt Will Go Supine/Side To Sit Outcome: Progressing Flowsheets (Taken 11/15/2024 1409) Pt will go Supine/Side to Sit: with moderate assist Goal: Patient Will Transfer Sit To/From Stand Outcome: Progressing Flowsheets (Taken 11/15/2024 1409) Patient will transfer sit to/from stand: with moderate assist Goal: Pt Will Transfer Bed To Chair/Chair To Bed Outcome: Progressing Flowsheets (Taken 11/15/2024 1409) Pt will Transfer Bed to Chair/Chair to Bed: with mod assist Goal: Pt Will Ambulate Outcome: Progressing Flowsheets (Taken 11/15/2024 1409) Pt will Ambulate:  15 feet  with moderate assist  with rolling walker   2:10 PM, 11/15/24 Lynwood Music, MPT Physical Therapist with [facility] [phone] office [phone] mobile phone  "

## 2024-11-15 NOTE — Telephone Encounter (Signed)
 Copied from CRM [id number]. Topic: Appointments - Appointment Cancel/Reschedule >> Nov 15, 2024  8:06 AM Marda MATSU wrote: Patient daughter cancelled appointment and declined rescheduling at this time. Daughter stated she feels as if her mother is  pretty bad off and not going to make it.

## 2024-11-15 NOTE — [place] FL2 (Signed)
 " [place]  MEDICAID FL2 LEVEL OF CARE FORM     IDENTIFICATION  Patient Name: Diana Mathews Birthdate: 01/18/1951 Sex: female Admission Date (Current Location): 11/14/2024  [facility] and [place] Number:  [employer] and Address:  [facility],  [address] [place] [address]      Provider Number: [id number]  Attending Physician Name and Address:  Mcarthur Pick, MD  Relative Name and Phone Number:  Itzy Adler (daughter)- [phone]    Current Level of Care: Hospital Recommended Level of Care: Skilled Nursing Facility Prior Approval Number:    Date Approved/Denied:   PASRR Number:    Discharge Plan: SNF    Current Diagnoses: Patient Active Problem List   Diagnosis Date Noted   Acute hypoxic respiratory failure (HCC) 11/14/2024   AKI (acute kidney injury) 11/01/2024   Morbid obesity (HCC) 10/06/2024   Diabetes mellitus treated with injections of non-insulin  medication (HCC) 07/30/2023   Stress incontinence 04/20/2023   Chemotherapy induced neutropenia 03/20/2022   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   Lymphocytosis 02/29/2020   Iron deficiency 02/07/2020   Malignant neoplasm metastatic to bone (HCC) 11/11/2019   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) 11/01/2019   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/01/2019   History of stroke 10/20/2019   Controlled type 2 diabetes mellitus with complication, without long-term current use of insulin  (HCC) 07/06/2018   Fibrillation, atrial (HCC) 06/13/2018   Obesity, Class III, BMI 40-49.9 (morbid obesity) (HCC) 06/12/2018   HLD (hyperlipidemia) 08/26/2016   Essential hypertension 04/15/2016   Mood disorder 04/15/2016   Insomnia 04/15/2016    Orientation RESPIRATION BLADDER Height & Weight     Self  O2 (5L) Indwelling catheter Weight: 250 lb 14.1 oz (113.8 kg) Height:  5' 3 (160 cm)  BEHAVIORAL SYMPTOMS/MOOD NEUROLOGICAL BOWEL NUTRITION STATUS       Continent Diet (See DC summary)  AMBULATORY STATUS COMMUNICATION OF NEEDS Skin   Extensive Assist Verbally Normal (Skin redness)                       Personal Care Assistance Level of Assistance  Bathing, Feeding, Dressing Bathing Assistance: Maximum assistance Feeding assistance: Independent Dressing Assistance: Maximum assistance     Functional Limitations Info  Sight, Hearing, Speech Sight Info: Adequate Hearing Info: Adequate Speech Info: Impaired (Difficult speaking)    SPECIAL CARE FACTORS FREQUENCY  PT (By licensed PT), OT (By licensed OT)     PT Frequency: 5 x a week OT Frequency: 5 x a week            Contractures Contractures Info: Not present    Additional Factors Info  Code Status, Allergies Code Status Info: DNR-Limited Allergies Info: Codeine           Current Medications (11/15/2024):  This is the current hospital active medication list Current Facility-Administered Medications  Medication Dose Route Frequency Provider Last Rate Last Admin   0.9 %  sodium chloride  infusion   Intravenous Continuous Shona Terry SAILOR, DO 10 mL/hr at 11/15/24 0747 Infusion Verify at 11/15/24 0747   apixaban  (ELIQUIS ) tablet 5 mg  5 mg Oral BID Hall, Carole N, DO   5 mg at 11/15/24 0901   cefTRIAXone  (ROCEPHIN ) 2 g in sodium chloride  0.9 % 100 mL IVPB  2 g Intravenous Q24H Shona Terry N, DO 200 mL/hr at 11/15/24 0806 2 g at 11/15/24 9193   Chlorhexidine  Gluconate Cloth 2 % PADS  6 each  6 each Topical Daily Shona Terry SAILOR, DO   6 each at 11/15/24 0902   doxycycline  (VIBRAMYCIN ) 100 mg in sodium chloride  0.9 % 250 mL IVPB  100 mg Intravenous Q12H Shona Terry SAILOR, DO 125 mL/hr at 11/15/24 1031 100 mg at 11/15/24 1031   guaiFENesin -dextromethorphan  (ROBITUSSIN DM) 100-10 MG/5ML syrup 5 mL  5 mL Oral Q4H PRN Hall, Carole N, DO       insulin  aspart (novoLOG ) injection 0-20 Units  0-20 Units Subcutaneous Q4H Shona Terry SAILOR, DO   3 Units at 11/15/24 9260    ipratropium-albuterol  (DUONEB) 0.5-2.5 (3) MG/3ML nebulizer solution 3 mL  3 mL Nebulization Q2H PRN Shona Terry N, DO       ipratropium-albuterol  (DUONEB) 0.5-2.5 (3) MG/3ML nebulizer solution 3 mL  3 mL Nebulization Q6H Hall, Carole N, DO   3 mL at 11/15/24 0802   letrozole  (FEMARA ) tablet 2.5 mg  2.5 mg Oral Daily Hall, Carole N, DO   2.5 mg at 11/15/24 0901   melatonin tablet 6 mg  6 mg Oral QHS PRN Hall, Carole N, DO       mupirocin  ointment (BACTROBAN ) 2 % 1 Application  1 Application Nasal BID Shona Terry SAILOR, DO   1 Application at 11/15/24 9193   polyethylene glycol (MIRALAX  / GLYCOLAX ) packet 17 g  17 g Oral Daily PRN Shona Terry SAILOR, DO       prochlorperazine  (COMPAZINE ) injection 5 mg  5 mg Intravenous Q6H PRN Hall, Carole N, DO       sodium zirconium cyclosilicate  (LOKELMA ) packet 10 g  10 g Oral Once Kammerer, Megan L, DO         Discharge Medications: Please see discharge summary for a list of discharge medications.  Relevant Imaging Results:  Relevant Lab Results:   Additional Information SSN: [id number]  Noreen KATHEE Pinal, LCSWA     "

## 2024-11-15 NOTE — Progress Notes (Signed)
 Foley catheter inserted for acute urinary retention with greater than 576 cc from bladder scan.  No charge note.

## 2024-11-16 DIAGNOSIS — J9601 Acute respiratory failure with hypoxia: Secondary | ICD-10-CM | POA: Diagnosis not present

## 2024-11-16 DIAGNOSIS — I480 Paroxysmal atrial fibrillation: Secondary | ICD-10-CM | POA: Diagnosis not present

## 2024-11-16 LAB — CBC WITH DIFFERENTIAL/PLATELET
Abs Immature Granulocytes: 0.2 K/uL — ABNORMAL HIGH (ref 0.00–0.07)
Band Neutrophils: 3 %
Basophils Absolute: 0 K/uL (ref 0.0–0.1)
Basophils Relative: 0 %
Eosinophils Absolute: 0.1 K/uL (ref 0.0–0.5)
Eosinophils Relative: 2 %
HCT: 28.2 % — ABNORMAL LOW (ref 36.0–46.0)
Hemoglobin: 9.7 g/dL — ABNORMAL LOW (ref 12.0–15.0)
Lymphocytes Relative: 20 %
Lymphs Abs: 0.5 K/uL — ABNORMAL LOW (ref 0.7–4.0)
MCH: 33.1 pg (ref 26.0–34.0)
MCHC: 34.4 g/dL (ref 30.0–36.0)
MCV: 96.2 fL (ref 80.0–100.0)
Metamyelocytes Relative: 3 %
Monocytes Absolute: 0.1 K/uL (ref 0.1–1.0)
Monocytes Relative: 3 %
Myelocytes: 3 %
Neutro Abs: 1.9 K/uL (ref 1.7–7.7)
Neutrophils Relative %: 66 %
Platelets: 172 K/uL (ref 150–400)
RBC: 2.93 MIL/uL — ABNORMAL LOW (ref 3.87–5.11)
RDW: 15.6 % — ABNORMAL HIGH (ref 11.5–15.5)
Smear Review: NORMAL
WBC: 2.7 K/uL — ABNORMAL LOW (ref 4.0–10.5)
nRBC: 0 % (ref 0.0–0.2)

## 2024-11-16 LAB — BASIC METABOLIC PANEL WITH GFR
Anion gap: 16 — ABNORMAL HIGH (ref 5–15)
BUN: 34 mg/dL — ABNORMAL HIGH (ref 8–23)
CO2: 19 mmol/L — ABNORMAL LOW (ref 22–32)
Calcium: 7.8 mg/dL — ABNORMAL LOW (ref 8.9–10.3)
Chloride: 90 mmol/L — ABNORMAL LOW (ref 98–111)
Creatinine, Ser: 3.09 mg/dL — ABNORMAL HIGH (ref 0.44–1.00)
GFR, Estimated: 15 mL/min — ABNORMAL LOW
Glucose, Bld: 165 mg/dL — ABNORMAL HIGH (ref 70–99)
Potassium: 5.6 mmol/L — ABNORMAL HIGH (ref 3.5–5.1)
Sodium: 125 mmol/L — ABNORMAL LOW (ref 135–145)

## 2024-11-16 LAB — GLUCOSE, CAPILLARY
Glucose-Capillary: 171 mg/dL — ABNORMAL HIGH (ref 70–99)
Glucose-Capillary: 172 mg/dL — ABNORMAL HIGH (ref 70–99)
Glucose-Capillary: 169 mg/dL — ABNORMAL HIGH (ref 70–99)
Glucose-Capillary: 238 mg/dL — ABNORMAL HIGH (ref 70–99)
Glucose-Capillary: 164 mg/dL — ABNORMAL HIGH (ref 70–99)
Glucose-Capillary: 148 mg/dL — ABNORMAL HIGH (ref 70–99)

## 2024-11-16 MED ORDER — DOXYCYCLINE HYCLATE 100 MG PO TABS
100.0000 mg | ORAL_TABLET | Freq: Two times a day (BID) | ORAL | Status: DC
  Administered 2024-11-16 – 2024-11-20 (×8): 100 mg via ORAL
  Filled 2024-11-16 (×8): qty 1

## 2024-11-16 MED ORDER — PHENOL 1.4 % MT LIQD: 1.0000 | OROMUCOSAL | Status: DC | PRN

## 2024-11-16 MED ORDER — FUROSEMIDE 10 MG/ML IJ SOLN
80.0000 mg | Freq: Once | INTRAMUSCULAR | Status: AC
  Administered 2024-11-16: 80 mg via INTRAVENOUS
  Filled 2024-11-16: qty 8

## 2024-11-16 MED ORDER — ARIPIPRAZOLE 5 MG PO TABS
5.0000 mg | ORAL_TABLET | Freq: Every day | ORAL | Status: DC
  Administered 2024-11-16 – 2024-11-25 (×10): 5 mg via ORAL
  Filled 2024-11-16 (×10): qty 1

## 2024-11-16 MED ORDER — TRAZODONE HCL 50 MG PO TABS
100.0000 mg | ORAL_TABLET | Freq: Every evening | ORAL | Status: DC | PRN
  Administered 2024-11-16 – 2024-11-22 (×7): 100 mg via ORAL
  Filled 2024-11-16 (×8): qty 2

## 2024-11-16 MED ORDER — METOPROLOL TARTRATE 50 MG PO TABS
50.0000 mg | ORAL_TABLET | Freq: Two times a day (BID) | ORAL | Status: DC
  Administered 2024-11-16 – 2024-11-23 (×14): 50 mg via ORAL
  Filled 2024-11-16 (×14): qty 1

## 2024-11-16 MED ORDER — DILTIAZEM HCL ER COATED BEADS 240 MG PO CP24
240.0000 mg | ORAL_CAPSULE | Freq: Every day | ORAL | Status: DC
  Administered 2024-11-16 – 2024-11-19 (×4): 240 mg via ORAL
  Filled 2024-11-16 (×4): qty 1

## 2024-11-16 MED ORDER — ATORVASTATIN CALCIUM 40 MG PO TABS
40.0000 mg | ORAL_TABLET | Freq: Every day | ORAL | Status: DC
  Administered 2024-11-16 – 2024-11-25 (×10): 40 mg via ORAL
  Filled 2024-11-16 (×10): qty 1

## 2024-11-16 MED ORDER — DILTIAZEM HCL 25 MG/5ML IV SOLN
10.0000 mg | Freq: Once | INTRAVENOUS | Status: AC
  Administered 2024-11-16: 10 mg via INTRAVENOUS
  Filled 2024-11-16: qty 5

## 2024-11-16 MED ORDER — ROPINIROLE HCL 1 MG PO TABS
1.0000 mg | ORAL_TABLET | Freq: Every day | ORAL | Status: DC
  Administered 2024-11-16 – 2024-11-24 (×9): 1 mg via ORAL
  Filled 2024-11-16 (×9): qty 1

## 2024-11-16 MED ORDER — BUSPIRONE HCL 5 MG PO TABS
10.0000 mg | ORAL_TABLET | Freq: Three times a day (TID) | ORAL | Status: DC
  Administered 2024-11-16 – 2024-11-25 (×28): 10 mg via ORAL
  Filled 2024-11-16 (×28): qty 2

## 2024-11-16 MED ORDER — DULOXETINE HCL 60 MG PO CPEP
60.0000 mg | ORAL_CAPSULE | Freq: Every day | ORAL | Status: DC
  Administered 2024-11-16 – 2024-11-25 (×10): 60 mg via ORAL
  Filled 2024-11-16 (×10): qty 1

## 2024-11-16 NOTE — Progress Notes (Signed)
 [name] NEPHROLOGY PROGRESS NOTE  Assessment/ Plan: Pt is a 74 y.o. yo female  with past medical history significant for hypertension, dyslipidemia, anemia, morbid obesity, A-fib on Eliquis , CKD 3b with multiple episodes of AKI who was presented with shortness of breath, seen as a consultation for AKI and hyponatremia.   # Acute hypoxic respiratory failure due to multifocal pneumonia: Requiring BiPAP and on broad-spectrum antibiotics per primary team.  Lasix  to off load fluid.    # Acute kidney injury on CKD 3b presumed due to infection, fluid overload and acute renal retention.  UA with UTI on antibiotics.  US  renal no hydro, unable to visualize left kidney due to body habitus. Given SOB and peripheral edema, I will order another dose of lasix  IV. Continue foley cath. Strict ins and outs and monitor lab. - No need for dialysis at this time.   # Hyponatremia, hypervolemic: Sodium level improved with IV Lasix .  Ordering another dose of Lasix  today.  Follow lab.   # Hyperkalemia with AKI: Received medical treatment and getting diuretics to excrete potassium.  Subjective:  Seen and examined at bedside. Urine output 1.45 L/24 hr. She reports feeling much better mainly SOB. No n/v or chest pain. O2 requirement is lower today. Objective Vital signs in last 24 hours: Vitals:   11/16/24 0400 11/16/24 0441 11/16/24 0741 11/16/24 0745  BP: (!) 161/73 (!) 161/73    Pulse: (!) 103     Resp: (!) 29 (!) 28    Temp:    99.2 F (37.3 C)  TempSrc:    Oral  SpO2: 91%  94%   Weight:      Height:       Weight change:   Intake/Output Summary (Last 24 hours) at 11/16/2024 0918 Last data filed at 11/16/2024 0840 Gross per 24 hour  Intake 723.62 ml  Output 1450 ml  Net -726.38 ml       Labs: RENAL PANEL Recent Labs  Lab 11/14/24 2242 11/15/24 0240 11/15/24 0702 11/15/24 0941 11/16/24 0418  NA 117* 121* 123* 123* 125*  K 5.7* 5.3* 5.4* 5.5* 5.6*  CL 85* 89* 88* 90* 90*   CO2 17* 17* 19* 17* 19*  GLUCOSE 354* 170* 172* 190* 165*  BUN 29* 30* 29* 31* 34*  CREATININE 2.75* 2.84* 3.00* 2.95* 3.09*  CALCIUM  8.1* 8.0* 7.9* 7.9* 7.8*  ALBUMIN  --   --   --  3.6  --     Liver Function Tests: Recent Labs  Lab 11/15/24 0941  ALBUMIN 3.6   No results for input(s): LIPASE, AMYLASE in the last 168 hours. No results for input(s): AMMONIA in the last 168 hours. CBC: Recent Labs    07/06/24 1509 10/06/24 1513 11/01/24 1711 11/01/24 1711 11/02/24 0425 11/14/24 2039 11/16/24 0418  HGB 10.0* 10.1* 11.1*  --  10.0* 9.4* 9.7*  MCV 104* 103* 100.0   < > 100.7* 95.1 96.2  VITAMINB12 543  --   --   --   --   --   --   FOLATE 12.5  --   --   --   --   --   --   FERRITIN 30  --   --   --   --   --   --   TIBC 397  --   --   --   --   --   --   IRON 57  --   --   --   --   --   --    < > =  values in this interval not displayed.    Cardiac Enzymes: No results for input(s): CKTOTAL, CKMB, CKMBINDEX, TROPONINI in the last 168 hours. CBG: Recent Labs  Lab 11/15/24 1614 11/15/24 1936 11/15/24 2337 11/16/24 0326 11/16/24 0723  GLUCAP 189* 173* 190* 171* 172*    Iron Studies: No results for input(s): IRON, TIBC, TRANSFERRIN, FERRITIN in the last 72 hours. Studies/Results: US  RENAL Result Date: 11/15/2024 EXAM: RETROPERITONEAL ULTRASOUND OF THE KIDNEYS 11/15/2024 12:31:26 PM TECHNIQUE: Real-time ultrasonography of the retroperitoneum, specifically the kidneys and urinary bladder, was performed. COMPARISON: 04/23/2023 CLINICAL HISTORY: Acute kidney injury. FINDINGS: RIGHT KIDNEY: Right kidney measures 12.4 x 6.0 x 5.2 cm. Normal cortical echogenicity. No hydronephrosis. No calculus. No mass. LEFT KIDNEY: Left kidney not visualized due to patient position, body habitus, and inability to move. BLADDER: Urinary bladder incompletely distended. IMPRESSION: 1. Left kidney not visualized due to patient position, body habitus, and inability to move. 2.  Urinary bladder incompletely distended. Electronically signed by: Katheleen Faes MD 11/15/2024 04:59 PM EST RP Workstation: [id number]   ECHOCARDIOGRAM COMPLETE Result Date: 11/15/2024    ECHOCARDIOGRAM REPORT   Patient Name:   Diana Mathews Date of Exam: 11/15/2024 Medical Rec #:  [id number]        Height:       63.0 in Accession #:    [id number]       Weight:       250.9 lb Date of Birth:  [birth year]       BSA:          2.129 m Patient Age:    73 years         BP:           161/62 mmHg Patient Gender: F                HR:           80 bpm. Exam Location:  [facility] Procedure: 2D Echo, Color Doppler, Cardiac Doppler and Intracardiac            Opacification Agent (Both Spectral and Color Flow Doppler were            utilized during procedure). Indications:    Congestive Heart Failure  History:        Patient has prior history of Echocardiogram examinations, most                 recent 06/13/2018. CHF, Stroke and TIA, Arrythmias:Atrial                 Fibrillation; Risk Factors:Hypertension, Dyslipidemia, Diabetes,                 Former Smoker and Sleep Apnea.  Sonographer:    Juliene Rucks Referring Phys: [id number] SANTOSH SIGDEL  Sonographer Comments: Technically challenging study due to limited acoustic windows, Technically difficult study due to poor echo windows, suboptimal parasternal window, suboptimal apical window, suboptimal subcostal window, patient is obese and echo performed with patient supine and on artificial respirator. Image acquisition challenging due to patient body habitus. IMPRESSIONS  1. Limited visualization even with echocontrast, not able to position patient on side while on bipap and BMI 44. Consider repeat study when off bipap. LVEF is probably low normal. . Left ventricular endocardial border not optimally defined to evaluate regional wall motion. There is mild left ventricular hypertrophy.  2. Right ventricular systolic function was not well visualized. The right ventricular size is  not well visualized. Tricuspid regurgitation signal is inadequate for  assessing PA pressure.  3. The mitral valve was not well visualized.  4. The aortic valve was not well visualized. FINDINGS  Left Ventricle: Limited visualization even with echocontrast, not able to position patient on side while on bipap and BMI 44. Consider repeat study when off bipap. LVEF is probably low normal. Left ventricular endocardial border not optimally defined to  evaluate regional wall motion. Definity  contrast agent was given IV to delineate the left ventricular endocardial borders. There is mild left ventricular hypertrophy. Right Ventricle: The right ventricular size is not well visualized. Right vetricular wall thickness was not well visualized. Right ventricular systolic function was not well visualized. Tricuspid regurgitation signal is inadequate for assessing PA pressure. Left Atrium: Left atrial size was not well visualized. Right Atrium: Right atrial size was not well visualized. Pericardium: There is no evidence of pericardial effusion. Mitral Valve: The mitral valve was not well visualized. Tricuspid Valve: The tricuspid valve is not well visualized. Tricuspid valve regurgitation is not demonstrated. No evidence of tricuspid stenosis. Aortic Valve: The aortic valve was not well visualized. Pulmonic Valve: The pulmonic valve was not well visualized. Pulmonic valve regurgitation is not visualized. No evidence of pulmonic stenosis. Aorta: The aortic root and ascending aorta are structurally normal, with no evidence of dilitation. IAS/Shunts: The interatrial septum was not well visualized.  LEFT VENTRICLE PLAX 2D LVIDd:         4.70 cm LVIDs:         3.70 cm LV PW:         1.10 cm LV IVS:        1.10 cm LVOT diam:     2.00 cm LVOT Area:     3.14 cm   AORTA Ao Root diam: 3.10 cm Ao Asc diam:  3.20 cm  SHUNTS Systemic Diam: 2.00 cm Dorn Ross MD Electronically signed by Dorn Ross MD Signature Date/Time:  11/15/2024/12:56:49 PM    Final    DG CHEST PORT 1 VIEW Result Date: 11/15/2024 EXAM: 1 VIEW(S) XRAY OF THE CHEST 11/15/2024 06:38:00 AM COMPARISON: Portable chest 11/14/2024, chest CT without contrast 11/14/2024 at 9:27 pm. CLINICAL HISTORY: Respiratory compromise. FINDINGS: LUNGS AND PLEURA: Patchy consolidation in the left greater than right lower lobes continues to be seen. Other less dense airspace disease noted on CT in the right upper lobe is not radiographically visible. The overall Aeration seems unchanged No pleural effusion. No pneumothorax. HEART AND MEDIASTINUM: There is mild cardiomegaly. Aortic atherosclerosis with normal mediastinal configuration. BONES AND SOFT TISSUES: Thoracic spondylosis. IMPRESSION: 1. Persistent patchy consolidation in the left greater than right lower lobes. 2. Mild cardiomegaly. Electronically signed by: Francis Quam MD 11/15/2024 07:05 AM EST RP Workstation: [id number]   CT Head Wo Contrast Result Date: 11/14/2024 EXAM: CT HEAD WITHOUT CONTRAST 11/14/2024 09:31:33 PM TECHNIQUE: CT of the head was performed without the administration of intravenous contrast. Automated exposure control, iterative reconstruction, and/or weight based adjustment of the mA/kV was utilized to reduce the radiation dose to as low as reasonably achievable. COMPARISON: 11/01/2024 CLINICAL HISTORY: ams ams FINDINGS: BRAIN AND VENTRICLES: No acute hemorrhage. No evidence of acute infarct. No hydrocephalus. No extra-axial collection. No mass effect or midline shift. Patchy low density throughout the deep white matter, likely chronic small vessel disease, stable. ORBITS: No acute abnormality. SINUSES: No acute abnormality. SOFT TISSUES AND SKULL: No acute soft tissue abnormality. No skull fracture. IMPRESSION: 1. No acute intracranial abnormality. 2. Stable patchy chronic small vessel disease. Electronically signed by: Franky Crease MD 11/14/2024  09:40 PM EST RP Workstation: [id number]   CT Chest Wo  Contrast Result Date: 11/14/2024 EXAM: CT CHEST WITHOUT CONTRAST 11/14/2024 09:32:16 PM TECHNIQUE: CT of the chest was performed without the administration of intravenous contrast. Multiplanar reformatted images are provided for review. Automated exposure control, iterative reconstruction, and/or weight based adjustment of the mA/kV was utilized to reduce the radiation dose to as low as reasonably achievable. COMPARISON: CT angiogram chest 10/32/2023. No images were available for direct comparison. CLINICAL HISTORY: sob sob FINDINGS: MEDIASTINUM: The heart is mildly enlarged. Coronary and aortic atherosclerotic calcifications are noted. The central airways are clear. LYMPH NODES: No mediastinal, hilar or axillary lymphadenopathy. LUNGS AND PLEURA: Mild patchy multifocal airspace opacities are seen in the bilateral lower lobes, lingula and right middle lobe. There are also minimal patchy ground glass opacities in the inferior right upper lobe. Airspace consolidation is the most significant in the left lower lobe. No pleural effusion or pneumothorax. SOFT TISSUES/BONES: Mildly sclerotic changes are seen in the T11 vertebral body and within multiple ribs as mentioned on prior scan. UPPER ABDOMEN: Limited images of the upper abdomen demonstrate likely numerous ill-defined hypodense liver lesions measuring up to 3.6 cm, indeterminate. IMPRESSION: 1. Multifocal pneumonia within the bilateral lower lobes, lingula, right middle lobe, and inferior right upper lobe; airspace consolidation is most significant in the left lower lobe. 2. Mild cardiomegaly. 3. Numerous ill-defined hypodense liver lesions measuring up to 3.6 cm, indeterminate; recommend further evaluation with contrast-enhanced MRI or multiphase CT of the liver. 4. Indeterminate sclerotic osseous lesions involving the T11 vertebral body and multiple ribs. Electronically signed by: Greig Pique MD 11/14/2024 09:40 PM EST RP Workstation: [id number]   CT Cervical  Spine Wo Contrast Result Date: 11/14/2024 EXAM: CT CERVICAL SPINE WITHOUT CONTRAST 11/14/2024 09:31:33 PM TECHNIQUE: CT of the cervical spine was performed without the administration of intravenous contrast. Multiplanar reformatted images are provided for review. Automated exposure control, iterative reconstruction, and/or weight based adjustment of the mA/kV was utilized to reduce the radiation dose to as low as reasonably achievable. COMPARISON: None available. CLINICAL HISTORY: ams, hit head, fell FINDINGS: BONES AND ALIGNMENT: Diffuse sclerosis throughout the C5 vertebral body. Sclerotic focus in the posterior elements at C7. Sclerosis within the mid left clavicle. Findings compatible with sclerotic metastases. No acute fracture or traumatic malalignment. DEGENERATIVE CHANGES: Moderate bilateral degenerative facet disease diffusely. Degenerative disc disease at C5-C6 and C6-C7. SOFT TISSUES: No prevertebral soft tissue swelling. IMPRESSION: 1. 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 Electronically signed by: Franky Crease MD 11/14/2024 09:39 PM EST RP Workstation: [id number]   DG Chest 1 View Result Date: 11/14/2024 CLINICAL DATA:  Shortness of breath. EXAM: CHEST  1 VIEW COMPARISON:  Chest radiograph dated 06/12/2018. FINDINGS: There is mild eventration of the right hemidiaphragm. No focal consolidation, pleural effusion or pneumothorax. There is mild cardiomegaly with mild central vascular congestion. No acute osseous pathology. IMPRESSION: Mild cardiomegaly with mild central vascular congestion. No focal consolidation. Electronically Signed   By: Vanetta Chou M.D.   On: 11/14/2024 20:54    Medications: Infusions:  cefTRIAXone  (ROCEPHIN )  IV Stopped (11/15/24 0836)   doxycycline  (VIBRAMYCIN ) IV Stopped (11/16/24 0148)    Scheduled Medications:  apixaban   5 mg Oral BID   Chlorhexidine  Gluconate Cloth  6 each  Topical Daily   insulin  aspart  0-20 Units Subcutaneous Q4H   ipratropium-albuterol   3 mL Nebulization Q6H   letrozole   2.5 mg Oral Daily  mupirocin  ointment  1 Application Nasal BID   mouth rinse  15 mL Mouth Rinse 4 times per day   sodium zirconium cyclosilicate   10 g Oral Once    have reviewed scheduled and prn medications.  Physical Exam: General:NAD, comfortable Heart:RRR, s1s2 nl Lungs:b/l crackle, no increase work in breath. Abdomen:soft, Non-tender, non-distended Extremities:b/l LE edema++ Neurology: Alert, awake.    Diana Mathews Prasad Elza Sortor 11/16/2024,9:18 AM  LOS: 2 days

## 2024-11-16 NOTE — Progress Notes (Signed)
" °   11/16/24 2235  BiPAP/CPAP/SIPAP  BiPAP/CPAP/SIPAP Pt Type Adult  BiPAP/CPAP/SIPAP SERVO  Mask Type Full face mask  Dentures removed? Yes - Placed in denture cup  Mask Size Medium  Set Rate 20 breaths/min  Respiratory Rate 32 breaths/min  IPAP 16 cmH20  EPAP 6 cmH2O  PEEP 6 cmH20  FiO2 (%) 40 %  Minute Ventilation 17.1  Leak 38  Peak Inspiratory Pressure (PIP) 16  Tidal Volume (Vt) 619  Patient Home Machine No  Patient Home Mask No  Patient Home Tubing No  Auto Titrate No  Press High Alarm 30 cmH2O  Device Plugged into RED Power Outlet Yes  BiPAP/CPAP /SiPAP Vitals  Pulse Rate 94  Resp (!) 32  SpO2 98 %  Bilateral Breath Sounds Clear;Diminished  MEWS Score/Color  MEWS Score 2  MEWS Score Color Yellow    "

## 2024-11-16 NOTE — Progress Notes (Signed)
 Physical Therapy Treatment Patient Details Name: Diana Mathews MRN: [id number] DOB: 04-04-1951 Today's Date: 11/16/2024   History of Present Illness Diana Mathews is a 74 y.o. female with medical history significant for metastatic breast cancer with mets to the bone on letrozole , paroxysmal A-fib on Eliquis , type 2 diabetes, hypertension, hyperlipidemia, iron deficiency anemia, severe morbid obesity, CKD 4, recently admitted from 11/01/2024 through 11/03/2024 for AKI on CKD 4, presents to the ER due to sudden onset shortness of breath tonight.  Her son noticed that she was having increased work of breathing and called EMS.  Upon EMS arrival, the patient was saturating 82% on room air.  She was placed on nonrebreather and brought to the ER for further evaluation.    PT Comments  Patient demonstrates improvement for sitting up at bedside with slow labored movement, had difficulty scooting to EOB due to mild posterior leaning, once seated able to complete BLE exercises without loss of sitting balance and able to complete sit to stand using RW, but limited to a few side steps due to weakness and poor standing balance. Patient tolerated sitting up in chair after therapy - RN aware. Patient will benefit from continued skilled physical therapy in hospital and recommended venue below to increase strength, balance, endurance for safe ADLs and gait.      If plan is discharge home, recommend the following: A lot of help with bathing/dressing/bathroom;A lot of help with walking and/or transfers;Help with stairs or ramp for entrance;Assist for transportation;Assistance with cooking/housework   Can travel by private vehicle     No  Equipment Recommendations  None recommended by PT    Recommendations for Other Services       Precautions / Restrictions Precautions Precautions: Fall Recall of Precautions/Restrictions: Intact Restrictions Weight Bearing Restrictions Per Provider Order: No      Mobility  Bed Mobility Overal bed mobility: Needs Assistance Bed Mobility: Supine to Sit     Supine to sit: Mod assist     General bed mobility comments: increased time, labored movement    Transfers Overall transfer level: Needs assistance Equipment used: Rolling walker (2 wheels) Transfers: Sit to/from Stand, Bed to chair/wheelchair/BSC Sit to Stand: Mod assist   Step pivot transfers: Mod assist       General transfer comment: increased BLE strength for completing sit to stands with slow labored movement    Ambulation/Gait Ambulation/Gait assistance: Mod assist, Max assist Gait Distance (Feet): 5 Feet Assistive device: Rolling walker (2 wheels) Gait Pattern/deviations: Decreased step length - right, Decreased step length - left, Decreased stride length, Knees buckling Gait velocity: slow     General Gait Details: limited to a few slow labored side steps due to fatigue and BLE weakness   Stairs             Wheelchair Mobility     Tilt Bed    Modified Rankin (Stroke Patients Only)       Balance Overall balance assessment: Needs assistance Sitting-balance support: Feet supported, No upper extremity supported Sitting balance-Leahy Scale: Fair Sitting balance - Comments: seated at EOB   Standing balance support: Reliant on assistive device for balance, During functional activity, Bilateral upper extremity supported Standing balance-Leahy Scale: Poor Standing balance comment: using RW                            Communication Communication Communication: No apparent difficulties  Cognition Arousal: Alert Behavior During Therapy: [facility] for tasks  assessed/performed, Anxious                             Following commands: Intact      Cueing Cueing Techniques: Verbal cues, Tactile cues  Exercises General Exercises - Lower Extremity Long Arc Quad: Seated, AROM, Strengthening, Both, 10 reps Hip Flexion/Marching: Seated,  Strengthening, AROM, Both, 10 reps Toe Raises: Seated, AROM, Strengthening, Both, 10 reps Heel Raises: Seated, AROM, Strengthening, Both, 10 reps    General Comments        Pertinent Vitals/Pain Pain Assessment Pain Assessment: No/denies pain    Home Living                          Prior Function            PT Goals (current goals can now be found in the care plan section) Acute Rehab PT Goals Patient Stated Goal: return home PT Goal Formulation: With patient Time For Goal Achievement: 11/29/24 Potential to Achieve Goals: Good Progress towards PT goals: Progressing toward goals    Frequency    Min 3X/week      PT Plan      Co-evaluation              AM-PAC PT 6 Clicks Mobility   Outcome Measure  Help needed turning from your back to your side while in a flat bed without using bedrails?: A Lot Help needed moving from lying on your back to sitting on the side of a flat bed without using bedrails?: A Lot Help needed moving to and from a bed to a chair (including a wheelchair)?: A Lot Help needed standing up from a chair using your arms (e.g., wheelchair or bedside chair)?: A Lot Help needed to walk in hospital room?: A Lot Help needed climbing 3-5 steps with a railing? : Total 6 Click Score: 11    End of Session   Activity Tolerance: Patient tolerated treatment well;Patient limited by fatigue Patient left: in chair;with call bell/phone within reach Nurse Communication: Mobility status PT Visit Diagnosis: Unsteadiness on feet (R26.81);History of falling (Z91.81);Other abnormalities of gait and mobility (R26.89);Difficulty in walking, not elsewhere classified (R26.2)     Time: 8993-8972 PT Time Calculation (min) (ACUTE ONLY): 21 min  Charges:    $Therapeutic Exercise: 8-22 mins $Therapeutic Activity: 8-22 mins PT General Charges $$ ACUTE PT VISIT: 1 Visit                     3:18 PM, 11/16/24 Lynwood Music, MPT Physical Therapist  with [facility] 336 [phone] office [phone] mobile phone

## 2024-11-16 NOTE — Evaluation (Signed)
 Clinical/Bedside Swallow Evaluation Patient Details  Name: Diana Mathews MRN: [id number] Date of Birth: [birth year]  Today's Date: 11/16/2024 Time: SLP Start Time (ACUTE ONLY): 1242 SLP Stop Time (ACUTE ONLY): 1259 SLP Time Calculation (min) (ACUTE ONLY): 17 min  Past Medical History:  Past Medical History:  Diagnosis Date   Atrial fibrillation (HCC) 06/12/2018   Depression    Essential hypertension    Headache    Hyperlipidemia    Metastatic breast cancer    Left breast, metastatic to bone and with pulmonary nodules - [facility]   Osteoarthritis    Sleep apnea    TIA (transient ischemic attack)    Type 2 diabetes mellitus (HCC)    Past Surgical History:  Past Surgical History:  Procedure Laterality Date   ABDOMINAL HYSTERECTOMY     CHOLECYSTECTOMY     FOOT SURGERY Left    HERNIA REPAIR     TUBAL LIGATION     WRIST SURGERY Bilateral    Carpal Tunnel   HPI:  : Diana Mathews is a 74 y.o. female with medical history significant for metastatic breast cancer with mets to the bone on letrozole , paroxysmal A-fib on Eliquis , type 2 diabetes, hypertension, hyperlipidemia, iron deficiency anemia, severe morbid obesity, CKD 4, recently admitted from 11/01/2024 through 11/03/2024 for AKI on CKD 4, presented to the ER on 11/14/24 due to sudden onset shortness of breath tonight.  Her son noticed that she was having increased work of breathing and called EMS.  Upon EMS arrival, the patient was saturating 82% on room air.  She was placed on nonrebreather and brought to the ER for further evaluation.     In the ER, tachycardic and tachypneic.  The patient was placed on BiPAP due to increased work of breathing.  Imaging revealed multifocal pneumonia within the bilateral lower lobes, lingula, right middle lobe and inferior right upper lobe, airspace consolidation is most significant in the left lower lobe.  Pt currently on Regular/thin liquid diet.  ST consulted for clinical swallow evaluation.     Assessment / Plan / Recommendation  Clinical Impression  Recommend continue pt preferred Regular/thin liquid consistencies d/t odynophagia.  Discussed respiratory precautions related to swallowing including utilizing small bites/sips, slow rate and taking respiratory breaks prn when fatigued during oral intake.  ST will f/u briefly in acute setting.  Pt seen for a clinical swallow evaluation with SLP providing min verbal cues for slow rate/small bites and sips during all intake without overt s/s of aspiration noted during small trial.  Pt was fatigued from recent PT session and declined solids; but puree, ice chips and thin liquids via small straw sips were administered and pt able to slowly masticate and swallow efficiently despite missing lower dentition (Pt with partial, natural lower dentition, but upper dentures present).  Pt did c/o odynophagia and slight white coating/material noted on lingual surface paired with xerostomia/cracked labial surface during OME.  Pt exhibited a min hypophonic, hoarse vocal quality when conversing, but demonstrated a strong, volitional cough response when asked during assessment.  ST will f/u in acute setting for diet tolerance/evidence of fatigue during dysphagia tx/education.  Thank you for this consult. SLP Visit Diagnosis: Dysphagia, unspecified (R13.10)    Aspiration Risk  Mild aspiration risk    Diet Recommendation   Thin;Age appropriate regular  Medication Administration: Whole meds with liquid (single pill administration)    Other Recommendations Oral Care Recommendations: Oral care BID     Swallow Evaluation Recommendations  PO diet (Regular/thin  liquids;pt preferred)   Assistance Recommended at Discharge  TBD  Functional Status Assessment Patient has had a recent decline in their functional status and demonstrates the ability to make significant improvements in function in a reasonable and predictable amount of time.  Frequency and Duration min 1  x/week  1 week       Prognosis Prognosis for improved oropharyngeal function: Good      Swallow Study   General Date of Onset: 11/14/24 HPI: : Diana Mathews is a 74 y.o. female with medical history significant for metastatic breast cancer with mets to the bone on letrozole , paroxysmal A-fib on Eliquis , type 2 diabetes, hypertension, hyperlipidemia, iron deficiency anemia, severe morbid obesity, CKD 4, recently admitted from 11/01/2024 through 11/03/2024 for AKI on CKD 4, presented to the ER on 11/14/24 due to sudden onset shortness of breath tonight.  Her son noticed that she was having increased work of breathing and called EMS.  Upon EMS arrival, the patient was saturating 82% on room air.  She was placed on nonrebreather and brought to the ER for further evaluation.     In the ER, tachycardic and tachypneic.  The patient was placed on BiPAP due to increased work of breathing.  Imaging revealed multifocal pneumonia within the bilateral lower lobes, lingula, right middle lobe and inferior right upper lobe, airspace consolidation is most significant in the left lower lobe.  Pt currently on Regular/thin liquid diet.  ST consulted for clinical swallow evaluation. Type of Study: Bedside Swallow Evaluation Previous Swallow Assessment: n/a Diet Prior to this Study: Regular;Thin liquids (Level 0) Temperature Spikes Noted: Yes (low grade, 99) Respiratory Status: Other (comment) (HFNC 10L) History of Recent Intubation: No Behavior/Cognition: Alert;Cooperative Oral Cavity Assessment: Other (comment) (slight white material on lingual surface;) Oral Care Completed by SLP: Recent completion by staff Oral Cavity - Dentition: Dentures, top;Adequate natural dentition Vision: Functional for self-feeding Self-Feeding Abilities: Needs assist Patient Positioning: Upright in bed Baseline Vocal Quality: Hoarse Volitional Cough: Strong Volitional Swallow: Able to elicit    Oral/Motor/Sensory Function Overall  Oral Motor/Sensory Function: Within functional limits   Ice Chips Ice chips: Within functional limits Presentation: Spoon   Thin Liquid Thin Liquid: Within functional limits Presentation: Cup;Straw Other Comments: small sips d/t respiratory status    Nectar Thick Nectar Thick Liquid: Not tested   Honey Thick Honey Thick Liquid: Not tested   Puree Puree: Within functional limits Presentation: Spoon   Solid     Solid: Not tested Other Comments: Pt declined d/t odynophagia      Pat Joncarlos Atkison,M.S.,CCC-SLP 11/16/2024,1:13 PM

## 2024-11-16 NOTE — Plan of Care (Signed)
" °  Problem: Education: Goal: Knowledge of General Education information will improve Description: Including pain rating scale, medication(s)/side effects and non-pharmacologic comfort measures Outcome: Progressing   Problem: Health Behavior/Discharge Planning: Goal: Ability to manage health-related needs will improve Outcome: Progressing   Problem: Clinical Measurements: Goal: Cardiovascular complication will be avoided Outcome: Progressing   Problem: Nutrition: Goal: Adequate nutrition will be maintained Outcome: Not Progressing   Problem: Pain Managment: Goal: General experience of comfort will improve and/or be controlled Outcome: Progressing   "

## 2024-11-16 NOTE — Progress Notes (Signed)
 Patient up to chair with physical therapy earlier. Sat up for a bit, wanted to get back to bed after lunch. Assisted patient back to the bed with walker and max assist. Patient became very fatigued, short of breath. Allowed patient to rest for a maybe around 30 minutes or more. Heart rate has maintained higher 140s. EKG shows Afib RVR. patient with hx of afib. takes cardizem  PO at home. Dr. Sigdel made aware.

## 2024-11-16 NOTE — Progress Notes (Signed)
 PHARMACIST - PHYSICIAN COMMUNICATION DR:   TRH CONCERNING: Antibiotic IV to Oral Route Change Policy  RECOMMENDATION: This patient is receiving Doxycycline  by the intravenous route.  Based on criteria approved by the Pharmacy and Therapeutics Committee, the antibiotic(s) is/are being converted to the equivalent oral dose form(s).   DESCRIPTION: These criteria include: Patient being treated for a respiratory tract infection, urinary tract infection, cellulitis or clostridium difficile associated diarrhea if on metronidazole The patient is not neutropenic and does not exhibit a GI malabsorption state The patient is eating (either orally or via tube) and/or has been taking other orally administered medications for a least 24 hours The patient is improving clinically and has a Tmax < 100.5  Off BiPAP and meets above criteria.  Change to PO due to shortage of IV doxycyline.    If you have questions about this conversion, please contact the Pharmacy Department  [x]   [phone] )  [facility] []   [phone] )  [facility] []   [phone] )  [facility] []   [phone] )  [facility] []   [phone] )  [facility]   Celestine Slovak, PharmD, [place], [place] Work Cell: [phone] 11/16/2024 11:54 AM

## 2024-11-16 NOTE — Progress Notes (Signed)
 " PROGRESS NOTE    Diana Mathews  FMW:[id number] DOB: 10/29/1950 DOA: 11/14/2024 PCP: Zollie Lowers, MD   Brief Narrative:    Diana Mathews is a 74 y.o. female with medical history significant for metastatic breast cancer with mets to the bone on letrozole , paroxysmal A-fib on Eliquis , type 2 diabetes, hypertension, hyperlipidemia, iron deficiency anemia, severe morbid obesity, CKD 4, recently admitted from 11/01/2024 through 11/03/2024 for AKI on CKD 4, presents to the ER due to sudden onset shortness of breath tonight.  Her son noticed that she was having increased work of breathing and called EMS.  Upon EMS arrival, the patient was saturating 82% on room air.  She was placed on nonrebreather and brought to the ER for further evaluation.   In the ER, tachycardic and tachypneic.  The patient was placed on BiPAP due to increased work of breathing.  Imaging revealed multifocal pneumonia within the bilateral lower lobes, lingula, right middle lobe and inferior right upper lobe, airspace consolidation is most significant in the left lower lobe.    ED Course: Temperature 99.3.  BP 131/57, pulse 81, respiration rate 18 blood, O2 saturation 99% on BiPAP 50% FiO2.  Admitted for hypoxia, pneumonia, AKI on CKD, volume overload, hyponatremia, hypernatremia.  Assessment & Plan:   Principal Problem:   Acute hypoxic respiratory failure (HCC) Active Problems:   Essential hypertension   Fibrillation, atrial (HCC)   Malignant neoplasm metastatic to bone (HCC)   AKI (acute kidney injury)   Acute hypoxic respiratory failure secondary to multifocal pneumonia, POA Not on oxygen supplementation at baseline. Reportedly with O2 saturation of 82% on room air, per EMS assessment. Oxygen weaned down to nasal cannula 6 L/min today. Will continue BiPAP at night and as needed during daytime Treat pneumonia with IV ceftriaxone , doxycycline  Volume off with IV Lasix , appreciate nephrology input.   Elevated  troponin in the setting of the above, suspect demand ischemia High sensitive troponin flat 25, 25 No evidence of acute ischemia on 12 lead EKG Monitor on telemetry Echocardiogram obtained with poor study, LV function likely low normal  Hyponatremia, POA Presented with serum sodium of 117 and glucose of 354.  Corrected sodium 121, sodium gradually improving to 125. Nephrology on board.  Appreciate their input.  Receiving IV Lasix .  Potassium is stable at 5.6.  Creatinine 3.1.  Hyperkalemia Potassium is still high 5.7 despite insulin , dextrose  and gluconate, Lokelma . Receiving IV Lasix .    Acute on CKD 4, mild anion gap acidosis -Nephrology consult. - Resume Lasix  today. -Will monitor labs closely.  Avoid nephrotoxins  Type 2 diabetes with hyperglycemia Presented with serum glucose of 354. Hemoglobin A1c 8.3 on 11/01/2024. Insulin  coverage in place.   Paroxysmal A-fib on Eliquis /rapid ventricular response -Will resume home Cardizem  to 50 mg daily, will give IV Cardizem  10 mg x 1 -Continue Eliquis   Anemia of chronic disease Hemoglobin 9.4 No reported overt bleeding Continue to monitor H&H.   Obesity BMI 44 Recommend weight loss outpatient with regular physical activity and healthy dieting.   Metastatic breast cancer with bone metastasis Currently on letrozole , prior to admission Close follow-up appointment with oncology.      DVT prophylaxis: On Eliquis .   Code Status: Full code.   Family Communication:    Disposition Plan: Admitted to stepdown unit.   Consults called: None.   Admission status: Inpatient status.     Status is: Inpatient The patient requires at least 2 midnight for further evaluation and treatment of present condition.  Subjective:  Patient is alert and oriented not in any acute distress.  Appears ill.  Conversant.  Denies chest pain or shortness of breath.  On 6 L/min oxygen this morning.  Wore BiPAP last night Later was tachy when up with  PT Objective: Vitals:   11/16/24 1200 11/16/24 1233 11/16/24 1259 11/16/24 1300  BP: 133/60 (!) 184/67  (!) 168/60  Pulse: (!) 121   (!) 147  Resp:    (!) 37  Temp:      TempSrc:      SpO2: 96%  93% 95%  Weight:      Height:        Intake/Output Summary (Last 24 hours) at 11/16/2024 1348 Last data filed at 11/16/2024 1222 Gross per 24 hour  Intake 662.2 ml  Output 3125 ml  Net -2462.8 ml   Filed Weights   11/14/24 2020 11/14/24 2356  Weight: 107.5 kg 113.8 kg    Examination:  General:alert, on BiPAP Chest: Bilateral crackles, no wheezing  CVS S1, S2, no murmur, regular rhythm Abdomen: Soft, nontender Extremities: Bilateral lower extremity edema   Data Reviewed: I have personally reviewed following labs and imaging studies  CBC: Recent Labs  Lab 11/14/24 2039 11/16/24 0418  WBC 5.9 2.7*  NEUTROABS 4.7 1.9  HGB 9.4* 9.7*  HCT 26.9* 28.2*  MCV 95.1 96.2  PLT 223 172   Basic Metabolic Panel: Recent Labs  Lab 11/14/24 2242 11/15/24 0240 11/15/24 0702 11/15/24 0941 11/16/24 0418  NA 117* 121* 123* 123* 125*  K 5.7* 5.3* 5.4* 5.5* 5.6*  CL 85* 89* 88* 90* 90*  CO2 17* 17* 19* 17* 19*  GLUCOSE 354* 170* 172* 190* 165*  BUN 29* 30* 29* 31* 34*  CREATININE 2.75* 2.84* 3.00* 2.95* 3.09*  CALCIUM  8.1* 8.0* 7.9* 7.9* 7.8*   GFR: Estimated Creatinine Clearance: 20.3 mL/min (A) (by C-G formula based on SCr of 3.09 mg/dL (H)). Liver Function Tests: Recent Labs  Lab 11/15/24 0941  ALBUMIN 3.6   No results for input(s): LIPASE, AMYLASE in the last 168 hours. No results for input(s): AMMONIA in the last 168 hours. Coagulation Profile: No results for input(s): INR, PROTIME in the last 168 hours. Cardiac Enzymes: No results for input(s): CKTOTAL, CKMB, CKMBINDEX, TROPONINI in the last 168 hours. BNP (last 3 results) Recent Labs    11/14/24 2039  PROBNP 814.0*   HbA1C: No results for input(s): HGBA1C in the last 72  hours. CBG: Recent Labs  Lab 11/15/24 1936 11/15/24 2337 11/16/24 0326 11/16/24 0723 11/16/24 1108  GLUCAP 173* 190* 171* 172* 169*   Lipid Profile: No results for input(s): CHOL, HDL, LDLCALC, TRIG, CHOLHDL, LDLDIRECT in the last 72 hours. Thyroid  Function Tests: No results for input(s): TSH, T4TOTAL, FREET4, T3FREE, THYROIDAB in the last 72 hours. Anemia Panel: No results for input(s): VITAMINB12, FOLATE, FERRITIN, TIBC, IRON, RETICCTPCT in the last 72 hours. Sepsis Labs: No results for input(s): PROCALCITON, LATICACIDVEN in the last 168 hours.  Recent Results (from the past 240 hours)  Resp panel by RT-PCR (RSV, Flu A&B, Covid) Anterior Nasal Swab     Status: None   Collection Time: 11/14/24  8:39 PM   Specimen: Anterior Nasal Swab  Result Value Ref Range Status   SARS Coronavirus 2 by RT PCR NEGATIVE NEGATIVE Final    Comment: (NOTE) SARS-CoV-2 target nucleic acids are NOT DETECTED.  The SARS-CoV-2 RNA is generally detectable in upper respiratory specimens during the acute phase of infection. The lowest concentration of SARS-CoV-2 viral  copies this assay can detect is 138 copies/mL. A negative result does not preclude SARS-Cov-2 infection and should not be used as the sole basis for treatment or other patient management decisions. A negative result may occur with  improper specimen collection/handling, submission of specimen other than nasopharyngeal swab, presence of viral mutation(s) within the areas targeted by this assay, and inadequate number of viral copies(<138 copies/mL). A negative result must be combined with clinical observations, patient history, and epidemiological information. The expected result is Negative.  Fact Sheet for Patients:  bloggercourse.com  Fact Sheet for Healthcare Providers:  seriousbroker.it  This test is no t yet approved or cleared by the [place]  [place] FDA and  has been authorized for detection and/or diagnosis of SARS-CoV-2 by FDA under an Emergency Use Authorization (EUA). This EUA will remain  in effect (meaning this test can be used) for the duration of the COVID-19 declaration under Section 564(b)(1) of the Act, 21 U.S.C.section 360bbb-3(b)(1), unless the authorization is terminated  or revoked sooner.       Influenza A by PCR NEGATIVE NEGATIVE Final   Influenza B by PCR NEGATIVE NEGATIVE Final    Comment: (NOTE) The Xpert Xpress SARS-CoV-2/FLU/RSV plus assay is intended as an aid in the diagnosis of influenza from Nasopharyngeal swab specimens and should not be used as a sole basis for treatment. Nasal washings and aspirates are unacceptable for Xpert Xpress SARS-CoV-2/FLU/RSV testing.  Fact Sheet for Patients: bloggercourse.com  Fact Sheet for Healthcare Providers: seriousbroker.it  This test is not yet approved or cleared by the [place]  FDA and has been authorized for detection and/or diagnosis of SARS-CoV-2 by FDA under an Emergency Use Authorization (EUA). This EUA will remain in effect (meaning this test can be used) for the duration of the COVID-19 declaration under Section 564(b)(1) of the Act, 21 U.S.C. section 360bbb-3(b)(1), unless the authorization is terminated or revoked.     Resp Syncytial Virus by PCR NEGATIVE NEGATIVE Final    Comment: (NOTE) Fact Sheet for Patients: bloggercourse.com  Fact Sheet for Healthcare Providers: seriousbroker.it  This test is not yet approved or cleared by the [place]  FDA and has been authorized for detection and/or diagnosis of SARS-CoV-2 by FDA under an Emergency Use Authorization (EUA). This EUA will remain in effect (meaning this test can be used) for the duration of the COVID-19 declaration under Section 564(b)(1) of the Act, 21 U.S.C. section  360bbb-3(b)(1), unless the authorization is terminated or revoked.  Performed at [facility], [address] [place], [place] [address]   MRSA Next Gen by PCR, Nasal     Status: Abnormal   Collection Time: 11/15/24 12:06 AM   Specimen: Nasal Mucosa; Nasal Swab  Result Value Ref Range Status   MRSA by PCR Next Gen DETECTED (A) NOT DETECTED Final    Comment: RESULT CALLED TO, READ BACK BY AND VERIFIED WITH: N RONE,RN@0338  11/15/24 MK (NOTE) The GeneXpert MRSA Assay (FDA approved for NASAL specimens only), is one component of a comprehensive MRSA colonization surveillance program. It is not intended to diagnose MRSA infection nor to guide or monitor treatment for MRSA infections. Test performance is not FDA approved in patients less than 40 years old. Performed at [facility], [address] [place], [place] [address]          Radiology Studies: US  RENAL Result Date: 11/15/2024 EXAM: RETROPERITONEAL ULTRASOUND OF THE KIDNEYS 11/15/2024 12:31:26 PM TECHNIQUE: Real-time ultrasonography of the retroperitoneum, specifically the kidneys and urinary bladder, was performed. COMPARISON: 04/23/2023  CLINICAL HISTORY: Acute kidney injury. FINDINGS: RIGHT KIDNEY: Right kidney measures 12.4 x 6.0 x 5.2 cm. Normal cortical echogenicity. No hydronephrosis. No calculus. No mass. LEFT KIDNEY: Left kidney not visualized due to patient position, body habitus, and inability to move. BLADDER: Urinary bladder incompletely distended. IMPRESSION: 1. Left kidney not visualized due to patient position, body habitus, and inability to move. 2. Urinary bladder incompletely distended. Electronically signed by: Katheleen Faes MD 11/15/2024 04:59 PM EST RP Workstation: [id number]   ECHOCARDIOGRAM COMPLETE Result Date: 11/15/2024    ECHOCARDIOGRAM REPORT   Patient Name:   DANALY BARI Date of Exam: 11/15/2024 Medical Rec #:  [id number]        Height:       63.0 in Accession #:    [id number]       Weight:       250.9  lb Date of Birth:  [birth year]       BSA:          2.129 m Patient Age:    73 years         BP:           161/62 mmHg Patient Gender: F                HR:           80 bpm. Exam Location:  [facility] Procedure: 2D Echo, Color Doppler, Cardiac Doppler and Intracardiac            Opacification Agent (Both Spectral and Color Flow Doppler were            utilized during procedure). Indications:    Congestive Heart Failure  History:        Patient has prior history of Echocardiogram examinations, most                 recent 06/13/2018. CHF, Stroke and TIA, Arrythmias:Atrial                 Fibrillation; Risk Factors:Hypertension, Dyslipidemia, Diabetes,                 Former Smoker and Sleep Apnea.  Sonographer:    Juliene Rucks Referring Phys: [id number] Everitt Wenner  Sonographer Comments: Technically challenging study due to limited acoustic windows, Technically difficult study due to poor echo windows, suboptimal parasternal window, suboptimal apical window, suboptimal subcostal window, patient is obese and echo performed with patient supine and on artificial respirator. Image acquisition challenging due to patient body habitus. IMPRESSIONS  1. Limited visualization even with echocontrast, not able to position patient on side while on bipap and BMI 44. Consider repeat study when off bipap. LVEF is probably low normal. . Left ventricular endocardial border not optimally defined to evaluate regional wall motion. There is mild left ventricular hypertrophy.  2. Right ventricular systolic function was not well visualized. The right ventricular size is not well visualized. Tricuspid regurgitation signal is inadequate for assessing PA pressure.  3. The mitral valve was not well visualized.  4. The aortic valve was not well visualized. FINDINGS  Left Ventricle: Limited visualization even with echocontrast, not able to position patient on side while on bipap and BMI 44. Consider repeat study when off bipap. LVEF is probably low  normal. Left ventricular endocardial border not optimally defined to  evaluate regional wall motion. Definity  contrast agent was given IV to delineate the left ventricular endocardial borders. There is mild left ventricular hypertrophy. Right Ventricle: The right ventricular size is not well  visualized. Right vetricular wall thickness was not well visualized. Right ventricular systolic function was not well visualized. Tricuspid regurgitation signal is inadequate for assessing PA pressure. Left Atrium: Left atrial size was not well visualized. Right Atrium: Right atrial size was not well visualized. Pericardium: There is no evidence of pericardial effusion. Mitral Valve: The mitral valve was not well visualized. Tricuspid Valve: The tricuspid valve is not well visualized. Tricuspid valve regurgitation is not demonstrated. No evidence of tricuspid stenosis. Aortic Valve: The aortic valve was not well visualized. Pulmonic Valve: The pulmonic valve was not well visualized. Pulmonic valve regurgitation is not visualized. No evidence of pulmonic stenosis. Aorta: The aortic root and ascending aorta are structurally normal, with no evidence of dilitation. IAS/Shunts: The interatrial septum was not well visualized.  LEFT VENTRICLE PLAX 2D LVIDd:         4.70 cm LVIDs:         3.70 cm LV PW:         1.10 cm LV IVS:        1.10 cm LVOT diam:     2.00 cm LVOT Area:     3.14 cm   AORTA Ao Root diam: 3.10 cm Ao Asc diam:  3.20 cm  SHUNTS Systemic Diam: 2.00 cm Dorn Ross MD Electronically signed by Dorn Ross MD Signature Date/Time: 11/15/2024/12:56:49 PM    Final    DG CHEST PORT 1 VIEW Result Date: 11/15/2024 EXAM: 1 VIEW(S) XRAY OF THE CHEST 11/15/2024 06:38:00 AM COMPARISON: Portable chest 11/14/2024, chest CT without contrast 11/14/2024 at 9:27 pm. CLINICAL HISTORY: Respiratory compromise. FINDINGS: LUNGS AND PLEURA: Patchy consolidation in the left greater than right lower lobes continues to be seen. Other  less dense airspace disease noted on CT in the right upper lobe is not radiographically visible. The overall Aeration seems unchanged No pleural effusion. No pneumothorax. HEART AND MEDIASTINUM: There is mild cardiomegaly. Aortic atherosclerosis with normal mediastinal configuration. BONES AND SOFT TISSUES: Thoracic spondylosis. IMPRESSION: 1. Persistent patchy consolidation in the left greater than right lower lobes. 2. Mild cardiomegaly. Electronically signed by: Francis Quam MD 11/15/2024 07:05 AM EST RP Workstation: [id number]   CT Head Wo Contrast Result Date: 11/14/2024 EXAM: CT HEAD WITHOUT CONTRAST 11/14/2024 09:31:33 PM TECHNIQUE: CT of the head was performed without the administration of intravenous contrast. Automated exposure control, iterative reconstruction, and/or weight based adjustment of the mA/kV was utilized to reduce the radiation dose to as low as reasonably achievable. COMPARISON: 11/01/2024 CLINICAL HISTORY: ams ams FINDINGS: BRAIN AND VENTRICLES: No acute hemorrhage. No evidence of acute infarct. No hydrocephalus. No extra-axial collection. No mass effect or midline shift. Patchy low density throughout the deep white matter, likely chronic small vessel disease, stable. ORBITS: No acute abnormality. SINUSES: No acute abnormality. SOFT TISSUES AND SKULL: No acute soft tissue abnormality. No skull fracture. IMPRESSION: 1. No acute intracranial abnormality. 2. Stable patchy chronic small vessel disease. Electronically signed by: Franky Crease MD 11/14/2024 09:40 PM EST RP Workstation: [id number]   CT Chest Wo Contrast Result Date: 11/14/2024 EXAM: CT CHEST WITHOUT CONTRAST 11/14/2024 09:32:16 PM TECHNIQUE: CT of the chest was performed without the administration of intravenous contrast. Multiplanar reformatted images are provided for review. Automated exposure control, iterative reconstruction, and/or weight based adjustment of the mA/kV was utilized to reduce the radiation dose to as low  as reasonably achievable. COMPARISON: CT angiogram chest 10/32/2023. No images were available for direct comparison. CLINICAL HISTORY: sob sob FINDINGS: MEDIASTINUM: The heart is mildly enlarged. Coronary and aortic  atherosclerotic calcifications are noted. The central airways are clear. LYMPH NODES: No mediastinal, hilar or axillary lymphadenopathy. LUNGS AND PLEURA: Mild patchy multifocal airspace opacities are seen in the bilateral lower lobes, lingula and right middle lobe. There are also minimal patchy ground glass opacities in the inferior right upper lobe. Airspace consolidation is the most significant in the left lower lobe. No pleural effusion or pneumothorax. SOFT TISSUES/BONES: Mildly sclerotic changes are seen in the T11 vertebral body and within multiple ribs as mentioned on prior scan. UPPER ABDOMEN: Limited images of the upper abdomen demonstrate likely numerous ill-defined hypodense liver lesions measuring up to 3.6 cm, indeterminate. IMPRESSION: 1. Multifocal pneumonia within the bilateral lower lobes, lingula, right middle lobe, and inferior right upper lobe; airspace consolidation is most significant in the left lower lobe. 2. Mild cardiomegaly. 3. Numerous ill-defined hypodense liver lesions measuring up to 3.6 cm, indeterminate; recommend further evaluation with contrast-enhanced MRI or multiphase CT of the liver. 4. Indeterminate sclerotic osseous lesions involving the T11 vertebral body and multiple ribs. Electronically signed by: Greig Pique MD 11/14/2024 09:40 PM EST RP Workstation: [id number]   CT Cervical Spine Wo Contrast Result Date: 11/14/2024 EXAM: CT CERVICAL SPINE WITHOUT CONTRAST 11/14/2024 09:31:33 PM TECHNIQUE: CT of the cervical spine was performed without the administration of intravenous contrast. Multiplanar reformatted images are provided for review. Automated exposure control, iterative reconstruction, and/or weight based adjustment of the mA/kV was utilized to reduce  the radiation dose to as low as reasonably achievable. COMPARISON: None available. CLINICAL HISTORY: ams, hit head, fell FINDINGS: BONES AND ALIGNMENT: Diffuse sclerosis throughout the C5 vertebral body. Sclerotic focus in the posterior elements at C7. Sclerosis within the mid left clavicle. Findings compatible with sclerotic metastases. No acute fracture or traumatic malalignment. DEGENERATIVE CHANGES: Moderate bilateral degenerative facet disease diffusely. Degenerative disc disease at C5-C6 and C6-C7. SOFT TISSUES: No prevertebral soft tissue swelling. IMPRESSION: 1. 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 Electronically signed by: Franky Crease MD 11/14/2024 09:39 PM EST RP Workstation: [id number]   DG Chest 1 View Result Date: 11/14/2024 CLINICAL DATA:  Shortness of breath. EXAM: CHEST  1 VIEW COMPARISON:  Chest radiograph dated 06/12/2018. FINDINGS: There is mild eventration of the right hemidiaphragm. No focal consolidation, pleural effusion or pneumothorax. There is mild cardiomegaly with mild central vascular congestion. No acute osseous pathology. IMPRESSION: Mild cardiomegaly with mild central vascular congestion. No focal consolidation. Electronically Signed   By: Vanetta Chou M.D.   On: 11/14/2024 20:54        Scheduled Meds:  apixaban   5 mg Oral BID   atorvastatin   40 mg Oral Daily   Chlorhexidine  Gluconate Cloth  6 each Topical Daily   diltiazem   240 mg Oral Daily   doxycycline   100 mg Oral Q12H   DULoxetine   60 mg Oral Daily   insulin  aspart  0-20 Units Subcutaneous Q4H   ipratropium-albuterol   3 mL Nebulization Q6H   letrozole   2.5 mg Oral Daily   mupirocin  ointment  1 Application Nasal BID   mouth rinse  15 mL Mouth Rinse 4 times per day   rOPINIRole   1 mg Oral QHS   sodium zirconium cyclosilicate   10 g Oral Once   Continuous Infusions:  cefTRIAXone  (ROCEPHIN )  IV Stopped (11/16/24  0955)          Derryl Duval, MD [facility] 11/16/2024, 1:48 PM   "

## 2024-11-17 ENCOUNTER — Inpatient Hospital Stay: Admitting: Nurse Practitioner

## 2024-11-17 DIAGNOSIS — J9601 Acute respiratory failure with hypoxia: Secondary | ICD-10-CM | POA: Diagnosis not present

## 2024-11-17 DIAGNOSIS — I480 Paroxysmal atrial fibrillation: Secondary | ICD-10-CM | POA: Diagnosis not present

## 2024-11-17 LAB — CBC WITH DIFFERENTIAL/PLATELET
Abs Immature Granulocytes: 0.5 K/uL — ABNORMAL HIGH (ref 0.00–0.07)
Band Neutrophils: 1 %
Basophils Absolute: 0 K/uL (ref 0.0–0.1)
Basophils Relative: 0 %
Eosinophils Absolute: 0 K/uL (ref 0.0–0.5)
Eosinophils Relative: 0 %
HCT: 26.6 % — ABNORMAL LOW (ref 36.0–46.0)
Hemoglobin: 9.3 g/dL — ABNORMAL LOW (ref 12.0–15.0)
Lymphocytes Relative: 16 %
Lymphs Abs: 1.2 K/uL (ref 0.7–4.0)
MCH: 33.6 pg (ref 26.0–34.0)
MCHC: 35 g/dL (ref 30.0–36.0)
MCV: 96 fL (ref 80.0–100.0)
Metamyelocytes Relative: 2 %
Monocytes Absolute: 0 K/uL — ABNORMAL LOW (ref 0.1–1.0)
Monocytes Relative: 0 %
Myelocytes: 4 %
Neutro Abs: 5.9 K/uL (ref 1.7–7.7)
Neutrophils Relative %: 77 %
Platelets: 224 K/uL (ref 150–400)
RBC: 2.77 MIL/uL — ABNORMAL LOW (ref 3.87–5.11)
RDW: 15.9 % — ABNORMAL HIGH (ref 11.5–15.5)
Smear Review: NORMAL
WBC: 7.6 K/uL (ref 4.0–10.5)
nRBC: 0 % (ref 0.0–0.2)

## 2024-11-17 LAB — BASIC METABOLIC PANEL WITH GFR
Anion gap: 16 — ABNORMAL HIGH (ref 5–15)
BUN: 34 mg/dL — ABNORMAL HIGH (ref 8–23)
CO2: 20 mmol/L — ABNORMAL LOW (ref 22–32)
Calcium: 8.3 mg/dL — ABNORMAL LOW (ref 8.9–10.3)
Chloride: 95 mmol/L — ABNORMAL LOW (ref 98–111)
Creatinine, Ser: 2.29 mg/dL — ABNORMAL HIGH (ref 0.44–1.00)
GFR, Estimated: 22 mL/min — ABNORMAL LOW
Glucose, Bld: 199 mg/dL — ABNORMAL HIGH (ref 70–99)
Potassium: 4.9 mmol/L (ref 3.5–5.1)
Sodium: 131 mmol/L — ABNORMAL LOW (ref 135–145)

## 2024-11-17 LAB — RENAL FUNCTION PANEL
Albumin: 3.2 g/dL — ABNORMAL LOW (ref 3.5–5.0)
Anion gap: 16 — ABNORMAL HIGH (ref 5–15)
BUN: 34 mg/dL — ABNORMAL HIGH (ref 8–23)
CO2: 20 mmol/L — ABNORMAL LOW (ref 22–32)
Calcium: 8.3 mg/dL — ABNORMAL LOW (ref 8.9–10.3)
Chloride: 95 mmol/L — ABNORMAL LOW (ref 98–111)
Creatinine, Ser: 2.24 mg/dL — ABNORMAL HIGH (ref 0.44–1.00)
GFR, Estimated: 23 mL/min — ABNORMAL LOW
Glucose, Bld: 196 mg/dL — ABNORMAL HIGH (ref 70–99)
Phosphorus: 3.3 mg/dL (ref 2.5–4.6)
Potassium: 4.9 mmol/L (ref 3.5–5.1)
Sodium: 131 mmol/L — ABNORMAL LOW (ref 135–145)

## 2024-11-17 LAB — GLUCOSE, CAPILLARY
Glucose-Capillary: 176 mg/dL — ABNORMAL HIGH (ref 70–99)
Glucose-Capillary: 187 mg/dL — ABNORMAL HIGH (ref 70–99)
Glucose-Capillary: 191 mg/dL — ABNORMAL HIGH (ref 70–99)
Glucose-Capillary: 213 mg/dL — ABNORMAL HIGH (ref 70–99)
Glucose-Capillary: 246 mg/dL — ABNORMAL HIGH (ref 70–99)
Glucose-Capillary: 261 mg/dL — ABNORMAL HIGH (ref 70–99)

## 2024-11-17 LAB — MAGNESIUM: Magnesium: 1.7 mg/dL (ref 1.7–2.4)

## 2024-11-17 MED ORDER — MAGNESIUM SULFATE IN D5W 1-5 GM/100ML-% IV SOLN
1.0000 g | Freq: Once | INTRAVENOUS | Status: AC
  Administered 2024-11-17: 1 g via INTRAVENOUS
  Filled 2024-11-17: qty 100

## 2024-11-17 MED ORDER — ACETAMINOPHEN 500 MG PO TABS
1000.0000 mg | ORAL_TABLET | Freq: Four times a day (QID) | ORAL | Status: DC | PRN
  Administered 2024-11-18 – 2024-11-24 (×12): 1000 mg via ORAL
  Filled 2024-11-17 (×12): qty 2

## 2024-11-17 MED ORDER — ENSURE PLUS HIGH PROTEIN PO LIQD
237.0000 mL | Freq: Two times a day (BID) | ORAL | Status: DC
  Administered 2024-11-17 – 2024-11-19 (×2): 237 mL via ORAL

## 2024-11-17 MED ORDER — ENSURE PLUS HIGH PROTEIN PO LIQD
237.0000 mL | Freq: Two times a day (BID) | ORAL | Status: DC
  Administered 2024-11-20 – 2024-11-25 (×7): 237 mL via ORAL

## 2024-11-17 MED ORDER — FUROSEMIDE 10 MG/ML IJ SOLN
80.0000 mg | Freq: Once | INTRAMUSCULAR | Status: AC
  Administered 2024-11-17: 80 mg via INTRAVENOUS
  Filled 2024-11-17: qty 8

## 2024-11-17 NOTE — Progress Notes (Signed)
 Occupational Therapy Treatment Patient Details Name: Diana Mathews MRN: [id number] DOB: 10-31-1950 Today's Date: 11/17/2024   History of present illness Diana Mathews is a 74 y.o. female with medical history significant for metastatic breast cancer with mets to the bone on letrozole , paroxysmal A-fib on Eliquis , type 2 diabetes, hypertension, hyperlipidemia, iron deficiency anemia, severe morbid obesity, CKD 4, recently admitted from 11/01/2024 through 11/03/2024 for AKI on CKD 4, presents to the ER due to sudden onset shortness of breath tonight.  Her son noticed that she was having increased work of breathing and called EMS.  Upon EMS arrival, the patient was saturating 82% on room air.  She was placed on nonrebreather and brought to the ER for further evaluation.   OT comments  Pt agreeable to OT and PT co-treatment today. Pt much more awake and able to follow commands compared to evaluation. Pt demonstrated need for min to mod A for bed mobility and mod A for EOB to chair transfer with RW. Pt tolerated B UE shoulder strengthening but required assist to reach as much of full range as possible. Pt reports baseline shoulder flexion deficits. Pt was able to reach to her ankle today but was unable to manage socks. Pt left in the chair with call bell within reach. Pt will benefit from continued OT in the hospital to increase strength, balance, and endurance for safe ADL's.         If plan is discharge home, recommend the following:  A lot of help with bathing/dressing/bathroom;Assistance with cooking/housework;Assistance with feeding;Direct supervision/assist for medications management;Assist for transportation;Help with stairs or ramp for entrance;A lot of help with walking and/or transfers   Equipment Recommendations  None recommended by OT    Recommendations for Other Services      Precautions / Restrictions Precautions Precautions: Fall Recall of Precautions/Restrictions:  Intact Restrictions Weight Bearing Restrictions Per Provider Order: No       Mobility Bed Mobility Overal bed mobility: Needs Assistance Bed Mobility: Supine to Sit     Supine to sit: Min assist, Mod assist, HOB elevated     General bed mobility comments: Labored effort; hand held assist; extended time; HOB elevated.    Transfers Overall transfer level: Needs assistance Equipment used: Rolling walker (2 wheels) Transfers: Sit to/from Stand, Bed to chair/wheelchair/BSC Sit to Stand: Mod assist     Step pivot transfers: Mod assist     General transfer comment: EOB to chair with RW; Pt stood from chair as well with a standing time of ~20 seconds. Unable to take any steps forward or backward.     Balance Overall balance assessment: Needs assistance Sitting-balance support: Feet supported, No upper extremity supported Sitting balance-Leahy Scale: Fair Sitting balance - Comments: seated at EOB   Standing balance support: Reliant on assistive device for balance, During functional activity, Bilateral upper extremity supported Standing balance-Leahy Scale: Poor Standing balance comment: using RW                           ADL either performed or assessed with clinical judgement   ADL Overall ADL's : Needs assistance/impaired                       Lower Body Dressing Details (indicate cue type and reason): Able to reach just above ankle, but unable to manage socks today. Likely at level of mod to max A for lower body dressing.  Extremity/Trunk Assessment Upper Extremity Assessment Upper Extremity Assessment: Generalized weakness (B UE limited to 3-/5 at baseline. Generally weak otherwise.)            Vision       Perception     Praxis     Communication Communication Communication: No apparent difficulties   Cognition Arousal: Alert Behavior During Therapy: WFL for tasks assessed/performed, Anxious Cognition: No  apparent impairments                               Following commands: Intact        Cueing   Cueing Techniques: Verbal cues, Tactile cues  Exercises Exercises: General Upper Extremity General Exercises - Upper Extremity Shoulder Flexion: AAROM, 5 reps, Seated, Both Shoulder ABduction: AAROM, Both, 5 reps, Seated Shoulder Horizontal ABduction: 5 reps, Both, AAROM, Seated (x10 AA/ROM protraction as well.)                 Pertinent Vitals/ Pain       Pain Assessment Pain Assessment: No/denies pain                                                          Frequency  Min 2X/week        Progress Toward Goals  OT Goals(current goals can now be found in the care plan section)  Progress towards OT goals: Progressing toward goals  Acute Rehab OT Goals Patient Stated Goal: none stated OT Goal Formulation: Patient unable to participate in goal setting Time For Goal Achievement: 11/29/24 Potential to Achieve Goals: Fair ADL Goals Pt Will Perform Eating: with modified independence;sitting Pt Will Perform Grooming: with contact guard assist;sitting Pt Will Perform Upper Body Bathing: with contact guard assist;sitting Pt Will Perform Lower Body Bathing: with modified independence Pt Will Perform Upper Body Dressing: with min assist;with adaptive equipment;sitting Pt Will Perform Lower Body Dressing: with min assist;with adaptive equipment;sitting/lateral leans Pt Will Transfer to Toilet: with min assist;ambulating;stand pivot transfer Pt Will Perform Toileting - Clothing Manipulation and hygiene: with contact guard assist;with min assist;sitting/lateral leans;bed level Pt/caregiver will Perform Home Exercise Program: Increased strength;Increased ROM;Both right and left upper extremity;Independently  Plan      Co-evaluation    PT/OT/SLP Co-Evaluation/Treatment: Yes Reason for Co-Treatment: To address functional/ADL transfers   OT  goals addressed during session: ADL's and self-care                         End of Session Equipment Utilized During Treatment: Rolling walker (2 wheels)  OT Visit Diagnosis: Unsteadiness on feet (R26.81);Other abnormalities of gait and mobility (R26.89);Muscle weakness (generalized) (M62.81);History of falling (Z91.81);Other symptoms and signs involving cognitive function   Activity Tolerance Patient tolerated treatment well   Patient Left in chair;with call bell/phone within reach   Nurse Communication Mobility status        Time: 8996-8972 OT Time Calculation (min): 24 min  Charges: OT General Charges $OT Visit: 1 Visit OT Treatments $Therapeutic Exercise: 8-22 mins (1 unit taken due to co-treat with PT)  JAYSON PERSON OT, MOT  Jayson Person 11/17/2024, 11:40 AM

## 2024-11-17 NOTE — Progress Notes (Signed)
 " PROGRESS NOTE    Diana Mathews  FMW:[id number] DOB: [birth year] DOA: 11/14/2024 PCP: Zollie Lowers, MD   Brief Narrative:    Diana Mathews is a 73 y.o. female with medical history significant for metastatic breast cancer with mets to the bone on letrozole , paroxysmal A-fib on Eliquis , type 2 diabetes, hypertension, hyperlipidemia, iron deficiency anemia, severe morbid obesity, CKD 4, recently admitted from 11/01/2024 through 11/03/2024 for AKI on CKD 4, presents to the ER due to sudden onset shortness of breath tonight.  Her son noticed that she was having increased work of breathing and called EMS.  Upon EMS arrival, the patient was saturating 82% on room air.  She was placed on nonrebreather and brought to the ER for further evaluation.   In the ER, tachycardic and tachypneic.  The patient was placed on BiPAP due to increased work of breathing.  Imaging revealed multifocal pneumonia within the bilateral lower lobes, lingula, right middle lobe and inferior right upper lobe, airspace consolidation is most significant in the left lower lobe.    ED Course: Temperature 99.3.  BP 131/57, pulse 81, respiration rate 18 blood, O2 saturation 99% on BiPAP 50% FiO2.  Admitted for hypoxia, pneumonia, AKI on CKD, volume overload, hyponatremia, hypernatremia.  Assessment & Plan:   Principal Problem:   Acute hypoxic respiratory failure (HCC) Active Problems:   Essential hypertension   Fibrillation, atrial (HCC)   Malignant neoplasm metastatic to bone (HCC)   AKI (acute kidney injury)   Acute hypoxic respiratory failure secondary to multifocal pneumonia, POA Not on oxygen supplementation at baseline. Reportedly with O2 saturation of 82% on room air, per EMS assessment. Continue supplemental oxygen through nasal cannula.   Will continue BiPAP at night and as needed during daytime Treat pneumonia with IV ceftriaxone , doxycycline .  Volume off with IV Lasix , appreciate nephrology input.   Elevated  troponin in the setting of the above, suspect demand ischemia High sensitive troponin flat 25, 25 No evidence of acute ischemia on 12 lead EKG Monitor on telemetry Echocardiogram obtained with poor study, LV function likely low normal  Hyponatremia, POA Presented with serum sodium of 117 and glucose of 354.  Corrected sodium 121, potassium 5.6. Electrolytes have improved.  Odium 131, potassium 4.5  Hyperkalemia Improved.  Acute on CKD 4, mild anion gap acidosis - Nephrology following, getting IV Lasix .  Type 2 diabetes with hyperglycemia Presented with serum glucose of 354. Hemoglobin A1c 8.3 on 11/01/2024. Insulin  coverage in place.   Paroxysmal A-fib on Eliquis /rapid ventricular response - Continue with oral Cardizem  to 40 mg daily, metoprolol  50 twice daily -Rate controlled today. -Continue Eliquis   Anemia of chronic disease Hemoglobin 9.4 No reported overt bleeding Continue to monitor H&H.   Obesity BMI 44 Recommend weight loss outpatient with regular physical activity and healthy dieting.   Metastatic breast cancer with bone metastasis Currently on letrozole , prior to admission Close follow-up appointment with oncology.      DVT prophylaxis: On Eliquis .   Code Status: Full code.   Family Communication:    Disposition Plan: Admitted to stepdown unit.   Consults called: None.   Admission status: Inpatient status.     Status is: Inpatient The patient requires at least 2 midnight for further evaluation and treatment of present condition.    Subjective:  Patient is alert and oriented not in any acute distress.  Improving/gradually.  Still on supplemental oxygen 7 to 8 L/min.  Having good urine output with resolution of hyperkalemia and improvement in  sodium.  Patient denies chest pain or shortness of breath.  Vital signs are stable.  Objective: Vitals:   11/17/24 1000 11/17/24 1100 11/17/24 1200 11/17/24 1456  BP: 138/68 (!) 138/55 (!) 136/56 (!) 161/61   Pulse: 96 96 97 98  Resp: (!) 27 (!) 27 (!) 27 (!) 25  Temp:      TempSrc:      SpO2: 97% 97% 96% 93%  Weight:      Height:        Intake/Output Summary (Last 24 hours) at 11/17/2024 1550 Last data filed at 11/17/2024 1225 Gross per 24 hour  Intake 260 ml  Output 1800 ml  Net -1540 ml   Filed Weights   11/14/24 2020 11/14/24 2356  Weight: 107.5 kg 113.8 kg    Examination:  General:alert, on BiPAP Chest: Bilateral crackles, no wheezing  CVS S1, S2, no murmur, regular rhythm Abdomen: Soft, nontender Extremities: Bilateral lower extremity edema   Data Reviewed: I have personally reviewed following labs and imaging studies  CBC: Recent Labs  Lab 11/14/24 2039 11/16/24 0418 11/17/24 0513  WBC 5.9 2.7* 7.6  NEUTROABS 4.7 1.9 5.9  HGB 9.4* 9.7* 9.3*  HCT 26.9* 28.2* 26.6*  MCV 95.1 96.2 96.0  PLT 223 172 224   Basic Metabolic Panel: Recent Labs  Lab 11/15/24 0702 11/15/24 0941 11/16/24 0418 11/17/24 0513 11/17/24 0519  NA 123* 123* 125* 131* 131*  K 5.4* 5.5* 5.6* 4.9 4.9  CL 88* 90* 90* 95* 95*  CO2 19* 17* 19* 20* 20*  GLUCOSE 172* 190* 165* 199* 196*  BUN 29* 31* 34* 34* 34*  CREATININE 3.00* 2.95* 3.09* 2.29* 2.24*  CALCIUM  7.9* 7.9* 7.8* 8.3* 8.3*  MG  --   --   --  1.7  --   PHOS  --   --   --   --  3.3   GFR: Estimated Creatinine Clearance: 28 mL/min (A) (by C-G formula based on SCr of 2.24 mg/dL (H)). Liver Function Tests: Recent Labs  Lab 11/15/24 0941 11/17/24 0519  ALBUMIN 3.6 3.2*   No results for input(s): LIPASE, AMYLASE in the last 168 hours. No results for input(s): AMMONIA in the last 168 hours. Coagulation Profile: No results for input(s): INR, PROTIME in the last 168 hours. Cardiac Enzymes: No results for input(s): CKTOTAL, CKMB, CKMBINDEX, TROPONINI in the last 168 hours. BNP (last 3 results) Recent Labs    11/14/24 2039  PROBNP 814.0*   HbA1C: No results for input(s): HGBA1C in the last 72  hours. CBG: Recent Labs  Lab 11/16/24 2022 11/16/24 2314 11/17/24 0430 11/17/24 0736 11/17/24 1111  GLUCAP 164* 148* 187* 191* 246*   Lipid Profile: No results for input(s): CHOL, HDL, LDLCALC, TRIG, CHOLHDL, LDLDIRECT in the last 72 hours. Thyroid  Function Tests: No results for input(s): TSH, T4TOTAL, FREET4, T3FREE, THYROIDAB in the last 72 hours. Anemia Panel: No results for input(s): VITAMINB12, FOLATE, FERRITIN, TIBC, IRON, RETICCTPCT in the last 72 hours. Sepsis Labs: No results for input(s): PROCALCITON, LATICACIDVEN in the last 168 hours.  Recent Results (from the past 240 hours)  Resp panel by RT-PCR (RSV, Flu A&B, Covid) Anterior Nasal Swab     Status: None   Collection Time: 11/14/24  8:39 PM   Specimen: Anterior Nasal Swab  Result Value Ref Range Status   SARS Coronavirus 2 by RT PCR NEGATIVE NEGATIVE Final    Comment: (NOTE) SARS-CoV-2 target nucleic acids are NOT DETECTED.  The SARS-CoV-2 RNA is generally detectable in  upper respiratory specimens during the acute phase of infection. The lowest concentration of SARS-CoV-2 viral copies this assay can detect is 138 copies/mL. A negative result does not preclude SARS-Cov-2 infection and should not be used as the sole basis for treatment or other patient management decisions. A negative result may occur with  improper specimen collection/handling, submission of specimen other than nasopharyngeal swab, presence of viral mutation(s) within the areas targeted by this assay, and inadequate number of viral copies(<138 copies/mL). A negative result must be combined with clinical observations, patient history, and epidemiological information. The expected result is Negative.  Fact Sheet for Patients:  bloggercourse.com  Fact Sheet for Healthcare Providers:  seriousbroker.it  This test is no t yet approved or cleared by the [place]  [place] FDA and  has been authorized for detection and/or diagnosis of SARS-CoV-2 by FDA under an Emergency Use Authorization (EUA). This EUA will remain  in effect (meaning this test can be used) for the duration of the COVID-19 declaration under Section 564(b)(1) of the Act, 21 U.S.C.section 360bbb-3(b)(1), unless the authorization is terminated  or revoked sooner.       Influenza A by PCR NEGATIVE NEGATIVE Final   Influenza B by PCR NEGATIVE NEGATIVE Final    Comment: (NOTE) The Xpert Xpress SARS-CoV-2/FLU/RSV plus assay is intended as an aid in the diagnosis of influenza from Nasopharyngeal swab specimens and should not be used as a sole basis for treatment. Nasal washings and aspirates are unacceptable for Xpert Xpress SARS-CoV-2/FLU/RSV testing.  Fact Sheet for Patients: bloggercourse.com  Fact Sheet for Healthcare Providers: seriousbroker.it  This test is not yet approved or cleared by the [place]  FDA and has been authorized for detection and/or diagnosis of SARS-CoV-2 by FDA under an Emergency Use Authorization (EUA). This EUA will remain in effect (meaning this test can be used) for the duration of the COVID-19 declaration under Section 564(b)(1) of the Act, 21 U.S.C. section 360bbb-3(b)(1), unless the authorization is terminated or revoked.     Resp Syncytial Virus by PCR NEGATIVE NEGATIVE Final    Comment: (NOTE) Fact Sheet for Patients: bloggercourse.com  Fact Sheet for Healthcare Providers: seriousbroker.it  This test is not yet approved or cleared by the [place]  FDA and has been authorized for detection and/or diagnosis of SARS-CoV-2 by FDA under an Emergency Use Authorization (EUA). This EUA will remain in effect (meaning this test can be used) for the duration of the COVID-19 declaration under Section 564(b)(1) of the Act, 21 U.S.C. section  360bbb-3(b)(1), unless the authorization is terminated or revoked.  Performed at [facility], [address], [place], [place] [address]   MRSA Next Gen by PCR, Nasal     Status: Abnormal   Collection Time: 11/15/24 12:06 AM   Specimen: Nasal Mucosa; Nasal Swab  Result Value Ref Range Status   MRSA by PCR Next Gen DETECTED (A) NOT DETECTED Final    Comment: RESULT CALLED TO, READ BACK BY AND VERIFIED WITH: N RONE,RN@[id number]  11/15/24 MK (NOTE) The GeneXpert MRSA Assay (FDA approved for NASAL specimens only), is one component of a comprehensive MRSA colonization surveillance program. It is not intended to diagnose MRSA infection nor to guide or monitor treatment for MRSA infections. Test performance is not FDA approved in patients less than 56 years old. Performed at [facility], [address] [place], [place] [address]          Radiology Studies: No results found.       Scheduled Meds:  apixaban   5  mg Oral BID   ARIPiprazole   5 mg Oral Daily   atorvastatin   40 mg Oral Daily   busPIRone   10 mg Oral TID   Chlorhexidine  Gluconate Cloth  6 each Topical Daily   diltiazem   240 mg Oral Daily   doxycycline   100 mg Oral Q12H   DULoxetine   60 mg Oral Daily   feeding supplement  237 mL Oral BID BM   feeding supplement  237 mL Oral BID BM   insulin  aspart  0-20 Units Subcutaneous Q4H   ipratropium-albuterol   3 mL Nebulization Q6H   letrozole   2.5 mg Oral Daily   metoprolol  tartrate  50 mg Oral BID   mupirocin  ointment  1 Application Nasal BID   mouth rinse  15 mL Mouth Rinse 4 times per day   rOPINIRole   1 mg Oral QHS   sodium zirconium cyclosilicate   10 g Oral Once   Continuous Infusions:  cefTRIAXone  (ROCEPHIN )  IV 2 g (11/17/24 0817)          Diana Duval, MD [facility] 11/17/2024, 3:50 PM   "

## 2024-11-17 NOTE — Progress Notes (Signed)
 Physical Therapy Treatment Patient Details Name: Diana Mathews MRN: [id number] DOB: 07/22/1951 Today's Date: 11/17/2024   History of Present Illness Diana Mathews is a 74 y.o. female with medical history significant for metastatic breast cancer with mets to the bone on letrozole , paroxysmal A-fib on Eliquis , type 2 diabetes, hypertension, hyperlipidemia, iron deficiency anemia, severe morbid obesity, CKD 4, recently admitted from 11/01/2024 through 11/03/2024 for AKI on CKD 4, presents to the ER due to sudden onset shortness of breath tonight.  Her son noticed that she was having increased work of breathing and called EMS.  Upon EMS arrival, the patient was saturating 82% on room air.  She was placed on nonrebreather and brought to the ER for further evaluation.    PT Comments  Patient demonstrates slow labored movement for sitting up at bedside, very unsteady on feet and limited to a few side steps before having to sit due to fall risk. Patient tolerated sitting up in chair after therapy with family member present. Patient will benefit from continued skilled physical therapy in hospital and recommended venue below to increase strength, balance, endurance for safe ADLs and gait.       If plan is discharge home, recommend the following: A lot of help with bathing/dressing/bathroom;A lot of help with walking and/or transfers;Help with stairs or ramp for entrance;Assist for transportation;Assistance with cooking/housework   Can travel by private vehicle     No  Equipment Recommendations  None recommended by PT    Recommendations for Other Services       Precautions / Restrictions Precautions Precautions: Fall Restrictions Weight Bearing Restrictions Per Provider Order: No     Mobility  Bed Mobility Overal bed mobility: Needs Assistance Bed Mobility: Supine to Sit     Supine to sit: Min assist, Mod assist, HOB elevated Sit to supine: Max assist   General bed mobility comments:  increased time, labord movement    Transfers Overall transfer level: Needs assistance Equipment used: Rolling walker (2 wheels) Transfers: Sit to/from Stand, Bed to chair/wheelchair/BSC Sit to Stand: Mod assist   Step pivot transfers: Mod assist       General transfer comment: unsteady labored movement with buckling of knees due to weakness    Ambulation/Gait Ambulation/Gait assistance: Mod assist, Max assist Gait Distance (Feet): 4 Feet Assistive device: Rolling walker (2 wheels) Gait Pattern/deviations: Decreased step length - right, Decreased step length - left, Decreased stride length, Knees buckling Gait velocity: slow     General Gait Details: unsteady on feet an limited to a few side ste before having to sit due to fall risk   Stairs             Wheelchair Mobility     Tilt Bed    Modified Rankin (Stroke Patients Only)       Balance Overall balance assessment: Needs assistance Sitting-balance support: Feet supported, No upper extremity supported Sitting balance-Leahy Scale: Fair Sitting balance - Comments: seated at EOB   Standing balance support: Reliant on assistive device for balance, During functional activity, Bilateral upper extremity supported Standing balance-Leahy Scale: Poor Standing balance comment: using RW                            Communication Communication Communication: No apparent difficulties  Cognition Arousal: Alert Behavior During Therapy: WFL for tasks assessed/performed, Anxious  Following commands: Intact Following commands impaired: Follows one step commands with increased time, Follows one step commands inconsistently    Cueing Cueing Techniques: Verbal cues, Tactile cues  Exercises General Exercises - Lower Extremity Long Arc Quad: Seated, AROM, Strengthening, Both, 10 reps Hip Flexion/Marching: Seated, Strengthening, AROM, Both, 10 reps Toe Raises: Seated,  AROM, Strengthening, Both, 10 reps Heel Raises: Seated, AROM, Strengthening, Both, 10 reps    General Comments        Pertinent Vitals/Pain Pain Assessment Pain Assessment: No/denies pain    Home Living                          Prior Function            PT Goals (current goals can now be found in the care plan section) Acute Rehab PT Goals Patient Stated Goal: return home PT Goal Formulation: With patient Time For Goal Achievement: 11/29/24 Potential to Achieve Goals: Good Progress towards PT goals: Progressing toward goals    Frequency    Min 3X/week      PT Plan      Co-evaluation PT/OT/SLP Co-Evaluation/Treatment: Yes Reason for Co-Treatment: To address functional/ADL transfers PT goals addressed during session: Mobility/safety with mobility;Balance;Proper use of DME        AM-PAC PT 6 Clicks Mobility   Outcome Measure  Help needed turning from your back to your side while in a flat bed without using bedrails?: A Lot Help needed moving from lying on your back to sitting on the side of a flat bed without using bedrails?: A Lot Help needed moving to and from a bed to a chair (including a wheelchair)?: A Lot Help needed standing up from a chair using your arms (e.g., wheelchair or bedside chair)?: A Lot Help needed to walk in hospital room?: A Lot Help needed climbing 3-5 steps with a railing? : Total 6 Click Score: 11    End of Session Equipment Utilized During Treatment: Gait belt Activity Tolerance: Patient tolerated treatment well;Patient limited by fatigue Patient left: in chair;with call bell/phone within reach Nurse Communication: Mobility status PT Visit Diagnosis: Unsteadiness on feet (R26.81);History of falling (Z91.81);Other abnormalities of gait and mobility (R26.89);Difficulty in walking, not elsewhere classified (R26.2)     Time: 8988-8974 PT Time Calculation (min) (ACUTE ONLY): 14 min  Charges:    $Therapeutic Activity:  8-22 mins PT General Charges $$ ACUTE PT VISIT: 1 Visit                     3:41 PM, 11/17/24 Lynwood Music, MPT Physical Therapist with [facility] [phone] office [phone] mobile phone

## 2024-11-17 NOTE — Plan of Care (Signed)

## 2024-11-17 NOTE — Progress Notes (Signed)
 [facility] NEPHROLOGY PROGRESS NOTE  Assessment/ Plan: Pt is a 74 y.o. yo female  with past medical history significant for hypertension, dyslipidemia, anemia, morbid obesity, A-fib on Eliquis , CKD 3b with multiple episodes of AKI who was presented with shortness of breath, seen as a consultation for AKI and hyponatremia.   # Acute hypoxic respiratory failure due to multifocal pneumonia: Improving oxygen requirements and on broad-spectrum antibiotics per primary team.  Will provide another dose of Lasix  to off load fluid.  After today I would place her on 40 mg of torsemide PO every day.   # Improving acute kidney injury on CKD 3b presumed due to infection, fluid overload and acute renal retention.  UA with UTI on antibiotics.  US  renal no hydro, unable to visualize left kidney due to body habitus.  Overall stable. No need for dialysis at this time.  Will go ahead and arrange ongoing outpatient CKD follow-up in our [place] clinic.   # Hyponatremia, hypervolemic: Sodium level stable at 131.   # Hyperkalemia with AKI: Resolved  Will sign off for now.  Please call with any questions or concerns.  Pt does need follow up with nephrology and I will make arrangements to be seen closely in our office.  Subjective:   Seen and examined, working with therapy. Creatinine stable at 2.24, BUN 34, K4.9. 3.6 L urine output with an 80 mg IV dose of Lasix  one-time yesterday Continues to require oxygen but this is stable.  Blood pressures are stable.  Objective Vital signs in last 24 hours: Vitals:   11/17/24 0600 11/17/24 0749 11/17/24 0810 11/17/24 0900  BP: 135/65  (!) 167/89 (!) 148/66  Pulse: (!) 103  (!) 113 (!) 106  Resp: (!) 25   (!) 24  Temp:  98.7 F (37.1 C)    TempSrc:  Oral    SpO2: 96%   96%  Weight:      Height:       Weight change:   Intake/Output Summary (Last 24 hours) at 11/17/2024 1032 Last data filed at 11/17/2024 0956 Gross per 24 hour  Intake 501.66 ml   Output 2650 ml  Net -2148.34 ml       Labs: RENAL PANEL Recent Labs  Lab 11/15/24 0702 11/15/24 0941 11/16/24 0418 11/17/24 0513 11/17/24 0519  NA 123* 123* 125* 131* 131*  K 5.4* 5.5* 5.6* 4.9 4.9  CL 88* 90* 90* 95* 95*  CO2 19* 17* 19* 20* 20*  GLUCOSE 172* 190* 165* 199* 196*  BUN 29* 31* 34* 34* 34*  CREATININE 3.00* 2.95* 3.09* 2.29* 2.24*  CALCIUM  7.9* 7.9* 7.8* 8.3* 8.3*  MG  --   --   --  1.7  --   PHOS  --   --   --   --  3.3  ALBUMIN  --  3.6  --   --  3.2*    Liver Function Tests: Recent Labs  Lab 11/15/24 0941 11/17/24 0519  ALBUMIN 3.6 3.2*   No results for input(s): LIPASE, AMYLASE in the last 168 hours. No results for input(s): AMMONIA in the last 168 hours. CBC: Recent Labs    07/06/24 1509 10/06/24 1513 11/01/24 1711 11/02/24 0425 11/14/24 2039 11/16/24 0418 11/17/24 0513  HGB 10.0*   < > 11.1* 10.0* 9.4* 9.7* 9.3*  MCV 104*   < > 100.0 100.7* 95.1 96.2 96.0  VITAMINB12 543  --   --   --   --   --   --  FOLATE 12.5  --   --   --   --   --   --   FERRITIN 30  --   --   --   --   --   --   TIBC 397  --   --   --   --   --   --   IRON 57  --   --   --   --   --   --    < > = values in this interval not displayed.    Cardiac Enzymes: No results for input(s): CKTOTAL, CKMB, CKMBINDEX, TROPONINI in the last 168 hours. CBG: Recent Labs  Lab 11/16/24 1630 11/16/24 2022 11/16/24 2314 11/17/24 0430 11/17/24 0736  GLUCAP 238* 164* 148* 187* 191*    Iron Studies: No results for input(s): IRON, TIBC, TRANSFERRIN, FERRITIN in the last 72 hours. Studies/Results: US  RENAL Result Date: 11/15/2024 EXAM: RETROPERITONEAL ULTRASOUND OF THE KIDNEYS 11/15/2024 12:31:26 PM TECHNIQUE: Real-time ultrasonography of the retroperitoneum, specifically the kidneys and urinary bladder, was performed. COMPARISON: 04/23/2023 CLINICAL HISTORY: Acute kidney injury. FINDINGS: RIGHT KIDNEY: Right kidney measures 12.4 x 6.0 x 5.2 cm. Normal  cortical echogenicity. No hydronephrosis. No calculus. No mass. LEFT KIDNEY: Left kidney not visualized due to patient position, body habitus, and inability to move. BLADDER: Urinary bladder incompletely distended. IMPRESSION: 1. Left kidney not visualized due to patient position, body habitus, and inability to move. 2. Urinary bladder incompletely distended. Electronically signed by: Dayne Hassell MD 11/15/2024 04:59 PM EST RP Workstation: [id number]    Medications: Infusions:  cefTRIAXone  (ROCEPHIN )  IV 2 g (11/17/24 0817)    Scheduled Medications:  apixaban   5 mg Oral BID   ARIPiprazole   5 mg Oral Daily   atorvastatin   40 mg Oral Daily   busPIRone   10 mg Oral TID   Chlorhexidine  Gluconate Cloth  6 each Topical Daily   diltiazem   240 mg Oral Daily   doxycycline   100 mg Oral Q12H   DULoxetine   60 mg Oral Daily   feeding supplement  237 mL Oral BID BM   feeding supplement  237 mL Oral BID BM   insulin  aspart  0-20 Units Subcutaneous Q4H   ipratropium-albuterol   3 mL Nebulization Q6H   letrozole   2.5 mg Oral Daily   metoprolol  tartrate  50 mg Oral BID   mupirocin  ointment  1 Application Nasal BID   mouth rinse  15 mL Mouth Rinse 4 times per day   rOPINIRole   1 mg Oral QHS   sodium zirconium cyclosilicate   10 g Oral Once    have reviewed scheduled and prn medications.  Physical Exam: General:NAD, comfortable Heart:RRR, s1s2 nl Lungs:b/l crackle, no increase work in breath. Abdomen:soft, Non-tender, non-distended Extremities:b/l LE edema+ Neurology: Alert, awake.    Liv Rallis B Selita Staiger 11/17/2024,10:32 AM  LOS: 3 days

## 2024-11-17 NOTE — TOC Progression Note (Signed)
 Transition of Care [facility]) - Progression Note    Patient Details  Name: Diana Mathews MRN: [id number] Date of Birth: [birth year]  [facility]) CM/SW Contact  Lucie Lunger, [place] Phone Number: 11/17/2024, 12:38 PM  Clinical Narrative:    CSW updated by admissions with [facility] who states admissions reviewed and do not feel they can offer a SNF bed for pt at this time. CSW spoke with pts daughter who is understanding and states she would like to accept bed at [facility] at this time. CSW updated HUB to reflect bed choice and updated Debbie in admissions with CV. TOC to follow.   Expected Discharge Plan: Skilled Nursing Facility Barriers to Discharge: Continued Medical Work up               Expected Discharge Plan and Services In-house Referral: Clinical Social Work Discharge Planning Services: CM Consult Post Acute Care Choice: Durable Medical Equipment Living arrangements for the past 2 months: Apartment                                       Social Drivers of Health (SDOH) Interventions SDOH Screenings   Food Insecurity: Patient Unable To Answer (11/15/2024)  Recent Concern: Food Insecurity - Food Insecurity Present (10/05/2024)  Housing: Unknown (11/15/2024)  Transportation Needs: Patient Unable To Answer (11/15/2024)  Recent Concern: Transportation Needs - Unmet Transportation Needs (10/05/2024)  Utilities: Patient Unable To Answer (11/15/2024)  Alcohol  Screen: Low Risk (07/04/2024)  Depression (PHQ2-9): Medium Risk (10/06/2024)  Financial Resource Strain: Low Risk (10/05/2024)  Physical Activity: Sufficiently Active (10/05/2024)  Social Connections: Unknown (11/15/2024)  Recent Concern: Social Connections - Socially Isolated (11/01/2024)  Stress: Stress Concern Present (10/05/2024)  Tobacco Use: Medium Risk (11/14/2024)  Health Literacy: Adequate Health Literacy (07/04/2024)    Readmission Risk Interventions    11/17/2024   12:06 PM  11/16/2024    7:50 AM 11/15/2024   10:43 AM  Readmission Risk Prevention Plan  Transportation Screening Complete Complete Complete  Home Care Screening   Complete  Medication Review (RN CM)   Complete  HRI or Home Care Consult Complete Complete   Social Work Consult for Recovery Care Planning/Counseling Complete Complete   Palliative Care Screening Not Applicable Not Applicable   Medication Review Oceanographer) Complete Complete

## 2024-11-17 NOTE — Progress Notes (Signed)
 SLP Cancellation Note  Patient Details Name: VELDA WENDT MRN: [id number] DOB: 1951-03-01   Cancelled treatment:       Reason Eval/Treat Not Completed: Patient declined, no reason specified;pt declined; had lunch tray prior and was fatigued from working with PT.  ST will continue efforts in acute setting.   Pat Aleeah Greeno,M.S., CCC-SLP 11/17/2024, 12:39 PM

## 2024-11-17 NOTE — Plan of Care (Signed)
" °  Problem: Clinical Measurements: Goal: Respiratory complications will improve Outcome: Progressing Goal: Cardiovascular complication will be avoided Outcome: Not Progressing   Problem: Activity: Goal: Risk for activity intolerance will decrease Outcome: Not Progressing   Problem: Nutrition: Goal: Adequate nutrition will be maintained Outcome: Progressing   "

## 2024-11-18 DIAGNOSIS — I480 Paroxysmal atrial fibrillation: Secondary | ICD-10-CM | POA: Diagnosis not present

## 2024-11-18 DIAGNOSIS — J9601 Acute respiratory failure with hypoxia: Secondary | ICD-10-CM | POA: Diagnosis not present

## 2024-11-18 LAB — GLUCOSE, CAPILLARY
Glucose-Capillary: 178 mg/dL — ABNORMAL HIGH (ref 70–99)
Glucose-Capillary: 180 mg/dL — ABNORMAL HIGH (ref 70–99)
Glucose-Capillary: 188 mg/dL — ABNORMAL HIGH (ref 70–99)
Glucose-Capillary: 223 mg/dL — ABNORMAL HIGH (ref 70–99)
Glucose-Capillary: 226 mg/dL — ABNORMAL HIGH (ref 70–99)
Glucose-Capillary: 243 mg/dL — ABNORMAL HIGH (ref 70–99)

## 2024-11-18 LAB — RENAL FUNCTION PANEL
Albumin: 3.4 g/dL — ABNORMAL LOW (ref 3.5–5.0)
Anion gap: 14 (ref 5–15)
BUN: 38 mg/dL — ABNORMAL HIGH (ref 8–23)
CO2: 24 mmol/L (ref 22–32)
Calcium: 8.9 mg/dL (ref 8.9–10.3)
Chloride: 97 mmol/L — ABNORMAL LOW (ref 98–111)
Creatinine, Ser: 2.13 mg/dL — ABNORMAL HIGH (ref 0.44–1.00)
GFR, Estimated: 24 mL/min — ABNORMAL LOW
Glucose, Bld: 171 mg/dL — ABNORMAL HIGH (ref 70–99)
Phosphorus: 2.7 mg/dL (ref 2.5–4.6)
Potassium: 4.3 mmol/L (ref 3.5–5.1)
Sodium: 135 mmol/L (ref 135–145)

## 2024-11-18 MED ORDER — TORSEMIDE 20 MG PO TABS
40.0000 mg | ORAL_TABLET | Freq: Every day | ORAL | Status: DC
  Administered 2024-11-18 – 2024-11-25 (×8): 40 mg via ORAL
  Filled 2024-11-18 (×8): qty 2

## 2024-11-18 MED ORDER — DILTIAZEM HCL 25 MG/5ML IV SOLN
20.0000 mg | Freq: Once | INTRAVENOUS | Status: AC
  Administered 2024-11-18: 20 mg via INTRAVENOUS
  Filled 2024-11-18: qty 5

## 2024-11-18 MED ORDER — DILTIAZEM HCL-DEXTROSE 125-5 MG/125ML-% IV SOLN (PREMIX)
5.0000 mg/h | INTRAVENOUS | Status: DC
  Administered 2024-11-18: 5 mg/h via INTRAVENOUS
  Administered 2024-11-19 – 2024-11-20 (×3): 15 mg/h via INTRAVENOUS
  Filled 2024-11-18 (×4): qty 125

## 2024-11-18 MED ORDER — IPRATROPIUM-ALBUTEROL 0.5-2.5 (3) MG/3ML IN SOLN
3.0000 mL | Freq: Three times a day (TID) | RESPIRATORY_TRACT | Status: DC
  Administered 2024-11-19 – 2024-11-25 (×19): 3 mL via RESPIRATORY_TRACT
  Filled 2024-11-18 (×18): qty 3

## 2024-11-18 NOTE — Progress Notes (Addendum)
 " PROGRESS NOTE    Diana Mathews  FMW:[id number] DOB: December 23, 1950 DOA: 11/14/2024 PCP: Zollie Lowers, MD   Brief Narrative:    Diana Mathews is a 74 y.o. female with medical history significant for metastatic breast cancer with mets to the bone on letrozole , paroxysmal A-fib on Eliquis , type 2 diabetes, hypertension, hyperlipidemia, iron deficiency anemia, severe morbid obesity, CKD 4, recently admitted from 11/01/2024 through 11/03/2024 for AKI on CKD 4, presents to the ER due to sudden onset shortness of breath tonight.  Her son noticed that she was having increased work of breathing and called EMS.  Upon EMS arrival, the patient was saturating 82% on room air.  She was placed on nonrebreather and brought to the ER for further evaluation.   In the ER, tachycardic and tachypneic.  The patient was placed on BiPAP due to increased work of breathing.  Imaging revealed multifocal pneumonia within the bilateral lower lobes, lingula, right middle lobe and inferior right upper lobe, airspace consolidation is most significant in the left lower lobe.    ED Course: Temperature 99.3.  BP 131/57, pulse 81, respiration rate 18 blood, O2 saturation 99% on BiPAP 50% FiO2.  Admitted for hypoxia, pneumonia, AKI on CKD, volume overload, hyponatremia, hypernatremia. Improving with antibiotics and diuresis  Assessment & Plan:   Principal Problem:   Acute hypoxic respiratory failure (HCC) Active Problems:   Essential hypertension   Fibrillation, atrial (HCC)   Malignant neoplasm metastatic to bone (HCC)   AKI (acute kidney injury)   Acute hypoxic respiratory failure secondary to multifocal pneumonia, POA Not on oxygen supplementation at baseline. Reportedly with O2 saturation of 82% on room air, per EMS assessment. Continue supplemental oxygen through nasal cannula.   Will continue BiPAP at night and as needed during daytime, currently oxygen 6 L/min, will wean down as tolerated Treat pneumonia with IV  ceftriaxone , doxycycline .  Supportive care Volume off with IV Lasix ---> switched to p.o. torsemide 1/23, appreciate nephrology input. Discontinue Foley catheter   Elevated troponin in the setting of the above, suspect demand ischemia High sensitive troponin flat 25, 25 No evidence of acute ischemia on 12 lead EKG Monitor on telemetry Echocardiogram obtained with poor study, LV function likely low normal  Hyponatremia, POA Presented with serum sodium of 117 and glucose of 354.  Corrected sodium 121, potassium 5.6. They have improved with diuresis.  Sodium 135, potassium 4.3, creatinine 2.13 on 1/23  Hyperkalemia Improved.  Acute on CKD 4, mild anion gap acidosis - Creatinine is stable at 2.13.  Needs outpatient nephrology follow-up.  Diuretics as able.  Type 2 diabetes with hyperglycemia Presented with serum glucose of 354. Hemoglobin A1c 8.3 on 11/01/2024. Insulin  coverage in place.   Paroxysmal A-fib on Eliquis /rapid ventricular response - Continue with oral Cardizem  to 40 mg daily, metoprolol  50 twice daily - Rates 100s. -Continue Eliquis   Anemia of chronic disease Hemoglobin 9.4 No reported overt bleeding Continue to monitor H&H.   Obesity BMI 44 Recommend weight loss outpatient with regular physical activity and healthy dieting.   Metastatic breast cancer with bone metastasis Currently on letrozole , prior to admission Close follow-up appointment with oncology.      DVT prophylaxis: On Eliquis .   Code Status: Full code.   Family Communication:    Disposition Plan: Admitted to stepdown unit.    Admission status: Inpatient status.     Status is: Inpatient The patient requires at least 2 midnight for further evaluation and treatment of present condition.  Subjective:  Patient feels improved each day.  Her extremity edema is improving.  She denies shortness of breath or chest pain.  She states she wore BiPAP last night as well.  Diuresed well with IV Lasix .   Electrolytes have improved.  No fever or chills.  Vital signs stable.  Heart rate have normalized with occasional readings in 100s.  Objective: Vitals:   11/18/24 0600 11/18/24 0700 11/18/24 0736 11/18/24 0847  BP:      Pulse: (!) 104 96    Resp: (!) 22 (!) 21    Temp:   98.5 F (36.9 C)   TempSrc:   Oral   SpO2: 95% 97%  97%  Weight:      Height:        Intake/Output Summary (Last 24 hours) at 11/18/2024 1125 Last data filed at 11/18/2024 0426 Gross per 24 hour  Intake 240 ml  Output 2525 ml  Net -2285 ml   Filed Weights   11/14/24 2020 11/14/24 2356  Weight: 107.5 kg 113.8 kg    Examination:  General:alert, oriented, not in any acute distress  chest: Bilateral crackles, no wheezing  CVS S1, S2, no murmur, regular rhythm Abdomen: Soft, nontender Extremities: Bilateral lower extremity edema improving   Data Reviewed: I have personally reviewed following labs and imaging studies  CBC: Recent Labs  Lab 11/14/24 2039 11/16/24 0418 11/17/24 0513  WBC 5.9 2.7* 7.6  NEUTROABS 4.7 1.9 5.9  HGB 9.4* 9.7* 9.3*  HCT 26.9* 28.2* 26.6*  MCV 95.1 96.2 96.0  PLT 223 172 224   Basic Metabolic Panel: Recent Labs  Lab 11/15/24 0941 11/16/24 0418 11/17/24 0513 11/17/24 0519 11/18/24 0245  NA 123* 125* 131* 131* 135  K 5.5* 5.6* 4.9 4.9 4.3  CL 90* 90* 95* 95* 97*  CO2 17* 19* 20* 20* 24  GLUCOSE 190* 165* 199* 196* 171*  BUN 31* 34* 34* 34* 38*  CREATININE 2.95* 3.09* 2.29* 2.24* 2.13*  CALCIUM  7.9* 7.8* 8.3* 8.3* 8.9  MG  --   --  1.7  --   --   PHOS  --   --   --  3.3 2.7   GFR: Estimated Creatinine Clearance: 29.4 mL/min (A) (by C-G formula based on SCr of 2.13 mg/dL (H)). Liver Function Tests: Recent Labs  Lab 11/15/24 0941 11/17/24 0519 11/18/24 0245  ALBUMIN 3.6 3.2* 3.4*   No results for input(s): LIPASE, AMYLASE in the last 168 hours. No results for input(s): AMMONIA in the last 168 hours. Coagulation Profile: No results for input(s):  INR, PROTIME in the last 168 hours. Cardiac Enzymes: No results for input(s): CKTOTAL, CKMB, CKMBINDEX, TROPONINI in the last 168 hours. BNP (last 3 results) Recent Labs    11/14/24 2039  PROBNP 814.0*   HbA1C: No results for input(s): HGBA1C in the last 72 hours. CBG: Recent Labs  Lab 11/17/24 1609 11/17/24 2038 11/17/24 2342 11/18/24 0415 11/18/24 0735  GLUCAP 261* 213* 176* 178* 180*   Lipid Profile: No results for input(s): CHOL, HDL, LDLCALC, TRIG, CHOLHDL, LDLDIRECT in the last 72 hours. Thyroid  Function Tests: No results for input(s): TSH, T4TOTAL, FREET4, T3FREE, THYROIDAB in the last 72 hours. Anemia Panel: No results for input(s): VITAMINB12, FOLATE, FERRITIN, TIBC, IRON, RETICCTPCT in the last 72 hours. Sepsis Labs: No results for input(s): PROCALCITON, LATICACIDVEN in the last 168 hours.  Recent Results (from the past 240 hours)  Resp panel by RT-PCR (RSV, Flu A&B, Covid) Anterior Nasal Swab     Status:  None   Collection Time: 11/14/24  8:39 PM   Specimen: Anterior Nasal Swab  Result Value Ref Range Status   SARS Coronavirus 2 by RT PCR NEGATIVE NEGATIVE Final    Comment: (NOTE) SARS-CoV-2 target nucleic acids are NOT DETECTED.  The SARS-CoV-2 RNA is generally detectable in upper respiratory specimens during the acute phase of infection. The lowest concentration of SARS-CoV-2 viral copies this assay can detect is 138 copies/mL. A negative result does not preclude SARS-Cov-2 infection and should not be used as the sole basis for treatment or other patient management decisions. A negative result may occur with  improper specimen collection/handling, submission of specimen other than nasopharyngeal swab, presence of viral mutation(s) within the areas targeted by this assay, and inadequate number of viral copies(<138 copies/mL). A negative result must be combined with clinical observations, patient history,  and epidemiological information. The expected result is Negative.  Fact Sheet for Patients:  bloggercourse.com  Fact Sheet for Healthcare Providers:  seriousbroker.it  This test is no t yet approved or cleared by the [place]  FDA and  has been authorized for detection and/or diagnosis of SARS-CoV-2 by FDA under an Emergency Use Authorization (EUA). This EUA will remain  in effect (meaning this test can be used) for the duration of the COVID-19 declaration under Section 564(b)(1) of the Act, 21 U.S.C.section 360bbb-3(b)(1), unless the authorization is terminated  or revoked sooner.       Influenza A by PCR NEGATIVE NEGATIVE Final   Influenza B by PCR NEGATIVE NEGATIVE Final    Comment: (NOTE) The Xpert Xpress SARS-CoV-2/FLU/RSV plus assay is intended as an aid in the diagnosis of influenza from Nasopharyngeal swab specimens and should not be used as a sole basis for treatment. Nasal washings and aspirates are unacceptable for Xpert Xpress SARS-CoV-2/FLU/RSV testing.  Fact Sheet for Patients: bloggercourse.com  Fact Sheet for Healthcare Providers: seriousbroker.it  This test is not yet approved or cleared by the [place]  FDA and has been authorized for detection and/or diagnosis of SARS-CoV-2 by FDA under an Emergency Use Authorization (EUA). This EUA will remain in effect (meaning this test can be used) for the duration of the COVID-19 declaration under Section 564(b)(1) of the Act, 21 U.S.C. section 360bbb-3(b)(1), unless the authorization is terminated or revoked.     Resp Syncytial Virus by PCR NEGATIVE NEGATIVE Final    Comment: (NOTE) Fact Sheet for Patients: bloggercourse.com  Fact Sheet for Healthcare Providers: seriousbroker.it  This test is not yet approved or cleared by the [place]  FDA and has  been authorized for detection and/or diagnosis of SARS-CoV-2 by FDA under an Emergency Use Authorization (EUA). This EUA will remain in effect (meaning this test can be used) for the duration of the COVID-19 declaration under Section 564(b)(1) of the Act, 21 U.S.C. section 360bbb-3(b)(1), unless the authorization is terminated or revoked.  Performed at [facility], [address] [place], [place] [address]   MRSA Next Gen by PCR, Nasal     Status: Abnormal   Collection Time: 11/15/24 12:06 AM   Specimen: Nasal Mucosa; Nasal Swab  Result Value Ref Range Status   MRSA by PCR Next Gen DETECTED (A) NOT DETECTED Final    Comment: RESULT CALLED TO, READ BACK BY AND VERIFIED WITH: N RONE,RN@0338  11/15/24 MK (NOTE) The GeneXpert MRSA Assay (FDA approved for NASAL specimens only), is one component of a comprehensive MRSA colonization surveillance program. It is not intended to diagnose MRSA infection nor to guide or monitor treatment for  MRSA infections. Test performance is not FDA approved in patients less than 57 years old. Performed at [facility], [address], [place], [place] [address]          Radiology Studies: No results found.       Scheduled Meds:  apixaban   5 mg Oral BID   ARIPiprazole   5 mg Oral Daily   atorvastatin   40 mg Oral Daily   busPIRone   10 mg Oral TID   Chlorhexidine  Gluconate Cloth  6 each Topical Daily   diltiazem   240 mg Oral Daily   doxycycline   100 mg Oral Q12H   DULoxetine   60 mg Oral Daily   feeding supplement  237 mL Oral BID BM   feeding supplement  237 mL Oral BID BM   insulin  aspart  0-20 Units Subcutaneous Q4H   ipratropium-albuterol   3 mL Nebulization Q6H   letrozole   2.5 mg Oral Daily   metoprolol  tartrate  50 mg Oral BID   mupirocin  ointment  1 Application Nasal BID   mouth rinse  15 mL Mouth Rinse 4 times per day   rOPINIRole   1 mg Oral QHS   sodium zirconium cyclosilicate   10 g Oral Once   Continuous Infusions:  cefTRIAXone   (ROCEPHIN )  IV 2 g (11/18/24 0912)          Derryl Duval, MD [facility] 11/18/2024, 11:25 AM   "

## 2024-11-18 NOTE — Plan of Care (Signed)

## 2024-11-18 NOTE — Progress Notes (Signed)
 Speech Language Pathology Treatment: Dysphagia  Patient Details Name: Diana Mathews MRN: [id number] DOB: [birth year] Today's Date: 11/18/2024 Time: 8456-8442 SLP Time Calculation (min) (ACUTE ONLY): 14 min  Assessment / Plan / Recommendation Clinical Impression  Ongoing diagnostic dysphagia therapy provided. Pt accepted limited trials of thin liquids and pleasantly declined solid trials. Pt consumed thin liquids without overt s/sx of aspiration. SLP reviewed strategies for energy conservation and strategies for taking breaks to breathe. Pt was receptive to strategies and acknowledges understanding. There are no further ST needs acutely. Recommend continue with current diet and aspiration precautions apply.  Our service will sign off.  HPI HPI: : Diana Mathews is a 74 y.o. female with medical history significant for metastatic breast cancer with mets to the bone on letrozole , paroxysmal A-fib on Eliquis , type 2 diabetes, hypertension, hyperlipidemia, iron deficiency anemia, severe morbid obesity, CKD 4, recently admitted from 11/01/2024 through 11/03/2024 for AKI on CKD 4, presented to the ER on 11/14/24 due to sudden onset shortness of breath tonight.  Her son noticed that she was having increased work of breathing and called EMS.  Upon EMS arrival, the patient was saturating 82% on room air.  She was placed on nonrebreather and brought to the ER for further evaluation.     In the ER, tachycardic and tachypneic.  The patient was placed on BiPAP due to increased work of breathing.  Imaging revealed multifocal pneumonia within the bilateral lower lobes, lingula, right middle lobe and inferior right upper lobe, airspace consolidation is most significant in the left lower lobe.  Pt currently on Regular/thin liquid diet.  ST consulted for clinical swallow evaluation.      SLP Plan  All goals met        Swallow Evaluation Recommendations   Recommendations: PO diet PO Diet Recommendation:  Regular;Thin liquids (Level 0) Liquid Administration via: Cup;Straw Medication Administration: Whole meds with liquid Supervision: Patient able to self-feed Postural changes: Position pt fully upright for meals;Stay upright 30-60 min after meals Oral care recommendations: Oral care BID (2x/day)                      Oral care BID     Dysphagia, unspecified (R13.10)     All goals met    Diana Mathews, Diana Mathews Speech Language Pathologist  Diana Mathews  11/18/2024, 3:57 PM

## 2024-11-18 NOTE — Significant Event (Signed)
"  ° °      CROSS COVER NOTE  NAME: Diana Mathews MRN: [id number] DOB : February 22, 1951 ATTENDING PHYSICIAN: Mcarthur Pick, MD    Date of Service   11/18/2024   HPI/Events of Note   TRH cross cover at [facility] received from nurse patient in Afib with RVR rate 140-162 and hypertensive with systolic over 200. No associated symptoms reported Review of chart patient received scheduled cardizem  CD 240 and metoprolol  50 mg  Interventions   Assessment/Plan:    11/18/2024    9:24 PM 11/18/2024    7:00 PM 11/18/2024   12:13 PM  Vitals with BMI  Systolic 143 150 829  Diastolic 110 70 72  Pulse 145 873 109    20 diltiazem  bolus and initiate cardizem  infusion        Erminio LITTIE Cone NP [facility] Cross Cover 7pm-7am - check amion for availability Pager [phone]  "

## 2024-11-19 DIAGNOSIS — L8990 Pressure ulcer of unspecified site, unspecified stage: Secondary | ICD-10-CM | POA: Insufficient documentation

## 2024-11-19 DIAGNOSIS — J9601 Acute respiratory failure with hypoxia: Secondary | ICD-10-CM | POA: Diagnosis not present

## 2024-11-19 LAB — GLUCOSE, CAPILLARY
Glucose-Capillary: 137 mg/dL — ABNORMAL HIGH (ref 70–99)
Glucose-Capillary: 180 mg/dL — ABNORMAL HIGH (ref 70–99)
Glucose-Capillary: 184 mg/dL — ABNORMAL HIGH (ref 70–99)
Glucose-Capillary: 211 mg/dL — ABNORMAL HIGH (ref 70–99)
Glucose-Capillary: 265 mg/dL — ABNORMAL HIGH (ref 70–99)
Glucose-Capillary: 327 mg/dL — ABNORMAL HIGH (ref 70–99)
Glucose-Capillary: 367 mg/dL — ABNORMAL HIGH (ref 70–99)

## 2024-11-19 LAB — MAGNESIUM: Magnesium: 1.3 mg/dL — ABNORMAL LOW (ref 1.7–2.4)

## 2024-11-19 LAB — BASIC METABOLIC PANEL WITH GFR
Anion gap: 12 (ref 5–15)
BUN: 35 mg/dL — ABNORMAL HIGH (ref 8–23)
CO2: 27 mmol/L (ref 22–32)
Calcium: 8.9 mg/dL (ref 8.9–10.3)
Chloride: 101 mmol/L (ref 98–111)
Creatinine, Ser: 1.6 mg/dL — ABNORMAL HIGH (ref 0.44–1.00)
GFR, Estimated: 34 mL/min — ABNORMAL LOW
Glucose, Bld: 224 mg/dL — ABNORMAL HIGH (ref 70–99)
Potassium: 3.6 mmol/L (ref 3.5–5.1)
Sodium: 139 mmol/L (ref 135–145)

## 2024-11-19 MED ORDER — OLANZAPINE 10 MG IM SOLR
5.0000 mg | Freq: Once | INTRAMUSCULAR | Status: AC
  Administered 2024-11-19: 5 mg via INTRAMUSCULAR
  Filled 2024-11-19: qty 10

## 2024-11-19 MED ORDER — ALPRAZOLAM 0.5 MG PO TABS
0.5000 mg | ORAL_TABLET | Freq: Once | ORAL | Status: AC
  Administered 2024-11-19: 0.5 mg via ORAL
  Filled 2024-11-19: qty 1

## 2024-11-19 MED ORDER — MAGNESIUM SULFATE 2 GM/50ML IV SOLN
2.0000 g | Freq: Once | INTRAVENOUS | Status: AC
  Administered 2024-11-19: 2 g via INTRAVENOUS
  Filled 2024-11-19: qty 50

## 2024-11-19 MED ORDER — POTASSIUM CHLORIDE CRYS ER 20 MEQ PO TBCR
40.0000 meq | EXTENDED_RELEASE_TABLET | Freq: Two times a day (BID) | ORAL | Status: AC
  Administered 2024-11-19 (×2): 40 meq via ORAL
  Filled 2024-11-19 (×2): qty 2

## 2024-11-19 NOTE — Plan of Care (Signed)

## 2024-11-19 NOTE — Progress Notes (Signed)
 " PROGRESS NOTE    Diana Mathews  FMW:[id number] DOB: 1951/04/06 DOA: 11/14/2024 PCP: Zollie Lowers, MD   Brief Narrative:    Diana Mathews is a 74 y.o. female with medical history significant for metastatic breast cancer with mets to the bone on letrozole , paroxysmal A-fib on Eliquis , type 2 diabetes, hypertension, hyperlipidemia, iron deficiency anemia, severe morbid obesity, CKD 4, recently admitted from 11/01/2024 through 11/03/2024 for AKI on CKD 4, presents to the ER due to sudden onset shortness of breath tonight.  Her son noticed that she was having increased work of breathing and called EMS.  Upon EMS arrival, the patient was saturating 82% on room air.  She was placed on nonrebreather and brought to the ER for further evaluation.   In the ER, tachycardic and tachypneic.  The patient was placed on BiPAP due to increased work of breathing.  Imaging revealed multifocal pneumonia within the bilateral lower lobes, lingula, right middle lobe and inferior right upper lobe, airspace consolidation is most significant in the left lower lobe.    ED Course: Temperature 99.3.  BP 131/57, pulse 81, respiration rate 18 blood, O2 saturation 99% on BiPAP 50% FiO2.  Admitted for hypoxia, pneumonia, AKI on CKD, volume overload, hyponatremia, hypernatremia. Improving with antibiotics and diuresis.  Recurrent A fib RVR, started on IV Cardizem  drip from 1/23 night  Assessment & Plan:   Principal Problem:   Acute hypoxic respiratory failure (HCC) Active Problems:   Essential hypertension   Fibrillation, atrial (HCC)   Malignant neoplasm metastatic to bone (HCC)   AKI (acute kidney injury)   Acute hypoxic respiratory failure secondary to below Multifocal pneumonia Volume overload  not on oxygen supplementation at baseline. Reportedly with O2 saturation of 82% on room air, on admission,  continue supplemental oxygen through nasal cannula.  Down to 4 to 6 L/min Will continue BiPAP at night and  as needed during daytime, will wean down as tolerated Treat pneumonia with IV ceftriaxone , doxycycline .  Supportive care Volume off with IV Lasix ---> switched to p.o. torsemide  1/23, appreciate nephrology input.   Elevated troponin in the setting of the above, suspect demand ischemia High sensitive troponin flat 25, 25 No evidence of acute ischemia on 12 lead EKG Monitor on telemetry Echocardiogram obtained with poor study, LV function likely low normal  Hyponatremia, POA Presented with serum sodium of 117 and glucose of 354.  Corrected sodium 121, potassium 5.6. They have improved with diuresis.  Sodium 135, potassium 4.3, creatinine 2.13 on 1/23  Hyperkalemia Improved.  Acute on CKD 4, mild anion gap acidosis - Creatinine is down to 1.6.  Needs outpatient nephrology follow-up.  Initiated on torsemide .  Type 2 diabetes with hyperglycemia Presented with serum glucose of 354. Hemoglobin A1c 8.3 on 11/01/2024. Insulin  coverage in place.   Paroxysmal A-fib on Eliquis /rapid ventricular response - Continue with oral Cardizem  240 mg daily, metoprolol  50 twice daily - Started on Cardizem  drip 1/23, will continue.  Rate is still uncontrolled. -Likely contributed by hypokalemia, hypomagnesemia and anxiety. -Replace electrolytes.  P.o. KCl, IV magnesium  -Continue Eliquis   Anemia of chronic disease Hemoglobin 9.4 No reported overt bleeding Continue to monitor H&H.   Obesity BMI 44 Recommend weight loss outpatient with regular physical activity and healthy dieting.   Metastatic breast cancer with bone metastasis Currently on letrozole , prior to admission Close follow-up appointment with oncology.    Continue home medications including BuSpar , Abilify .   DVT prophylaxis: On Eliquis .   Code Status: Full code.  Family Communication:    Disposition Plan: Admitted to stepdown unit.    Admission status: Inpatient status.     Status is: Inpatient The patient requires at least 2  midnight for further evaluation and treatment of present condition.    Subjective:  Patient seen and examined earlier today.  She went into A-fib RVR overnight and started on Cardizem  drip.  Heart rate is still elevated 120s to 130s.  Later in the evening she apparently was hallucinating and having anxiety attacks. Blood pressure stable. Objective: Vitals:   11/19/24 1620 11/19/24 1641 11/19/24 1725 11/19/24 1726  BP:   (!) 179/97   Pulse:  (!) 136 (!) 135   Resp:  (!) 26 (!) 29   Temp: 100.1 F (37.8 C) 97.9 F (36.6 C)    TempSrc: Axillary Oral    SpO2:  90%  94%  Weight:      Height:        Intake/Output Summary (Last 24 hours) at 11/19/2024 1816 Last data filed at 11/19/2024 1809 Gross per 24 hour  Intake 523.89 ml  Output 3365 ml  Net -2841.11 ml   Filed Weights   11/14/24 2020 11/14/24 2356  Weight: 107.5 kg 113.8 kg    Examination:  General:alert, oriented, not in any acute distress  chest: Bilateral crackles, no wheezing  CVS S1, S2, no murmur, regular rhythm Abdomen: Soft, nontender Extremities: Bilateral lower extremity edema improving   Data Reviewed: I have personally reviewed following labs and imaging studies  CBC: Recent Labs  Lab 11/14/24 2039 11/16/24 0418 11/17/24 0513  WBC 5.9 2.7* 7.6  NEUTROABS 4.7 1.9 5.9  HGB 9.4* 9.7* 9.3*  HCT 26.9* 28.2* 26.6*  MCV 95.1 96.2 96.0  PLT 223 172 224   Basic Metabolic Panel: Recent Labs  Lab 11/16/24 0418 11/17/24 0513 11/17/24 0519 11/18/24 0245 11/19/24 0327  NA 125* 131* 131* 135 139  K 5.6* 4.9 4.9 4.3 3.6  CL 90* 95* 95* 97* 101  CO2 19* 20* 20* 24 27  GLUCOSE 165* 199* 196* 171* 224*  BUN 34* 34* 34* 38* 35*  CREATININE 3.09* 2.29* 2.24* 2.13* 1.60*  CALCIUM  7.8* 8.3* 8.3* 8.9 8.9  MG  --  1.7  --   --  1.3*  PHOS  --   --  3.3 2.7  --    GFR: Estimated Creatinine Clearance: 39.2 mL/min (A) (by C-G formula based on SCr of 1.6 mg/dL (H)). Liver Function Tests: Recent Labs  Lab  11/15/24 0941 11/17/24 0519 11/18/24 0245  ALBUMIN 3.6 3.2* 3.4*   No results for input(s): LIPASE, AMYLASE in the last 168 hours. No results for input(s): AMMONIA in the last 168 hours. Coagulation Profile: No results for input(s): INR, PROTIME in the last 168 hours. Cardiac Enzymes: No results for input(s): CKTOTAL, CKMB, CKMBINDEX, TROPONINI in the last 168 hours. BNP (last 3 results) Recent Labs    11/14/24 2039  PROBNP 814.0*   HbA1C: No results for input(s): HGBA1C in the last 72 hours. CBG: Recent Labs  Lab 11/19/24 0011 11/19/24 0353 11/19/24 0730 11/19/24 1108 11/19/24 1612  GLUCAP 184* 211* 180* 367* 265*   Lipid Profile: No results for input(s): CHOL, HDL, LDLCALC, TRIG, CHOLHDL, LDLDIRECT in the last 72 hours. Thyroid  Function Tests: No results for input(s): TSH, T4TOTAL, FREET4, T3FREE, THYROIDAB in the last 72 hours. Anemia Panel: No results for input(s): VITAMINB12, FOLATE, FERRITIN, TIBC, IRON, RETICCTPCT in the last 72 hours. Sepsis Labs: No results for input(s): PROCALCITON, LATICACIDVEN in  the last 168 hours.  Recent Results (from the past 240 hours)  Resp panel by RT-PCR (RSV, Flu A&B, Covid) Anterior Nasal Swab     Status: None   Collection Time: 11/14/24  8:39 PM   Specimen: Anterior Nasal Swab  Result Value Ref Range Status   SARS Coronavirus 2 by RT PCR NEGATIVE NEGATIVE Final    Comment: (NOTE) SARS-CoV-2 target nucleic acids are NOT DETECTED.  The SARS-CoV-2 RNA is generally detectable in upper respiratory specimens during the acute phase of infection. The lowest concentration of SARS-CoV-2 viral copies this assay can detect is 138 copies/mL. A negative result does not preclude SARS-Cov-2 infection and should not be used as the sole basis for treatment or other patient management decisions. A negative result may occur with  improper specimen collection/handling, submission of  specimen other than nasopharyngeal swab, presence of viral mutation(s) within the areas targeted by this assay, and inadequate number of viral copies(<138 copies/mL). A negative result must be combined with clinical observations, patient history, and epidemiological information. The expected result is Negative.  Fact Sheet for Patients:  bloggercourse.com  Fact Sheet for Healthcare Providers:  seriousbroker.it  This test is no t yet approved or cleared by the [place]  FDA and  has been authorized for detection and/or diagnosis of SARS-CoV-2 by FDA under an Emergency Use Authorization (EUA). This EUA will remain  in effect (meaning this test can be used) for the duration of the COVID-19 declaration under Section 564(b)(1) of the Act, 21 U.S.C.section 360bbb-3(b)(1), unless the authorization is terminated  or revoked sooner.       Influenza A by PCR NEGATIVE NEGATIVE Final   Influenza B by PCR NEGATIVE NEGATIVE Final    Comment: (NOTE) The Xpert Xpress SARS-CoV-2/FLU/RSV plus assay is intended as an aid in the diagnosis of influenza from Nasopharyngeal swab specimens and should not be used as a sole basis for treatment. Nasal washings and aspirates are unacceptable for Xpert Xpress SARS-CoV-2/FLU/RSV testing.  Fact Sheet for Patients: bloggercourse.com  Fact Sheet for Healthcare Providers: seriousbroker.it  This test is not yet approved or cleared by the [place]  FDA and has been authorized for detection and/or diagnosis of SARS-CoV-2 by FDA under an Emergency Use Authorization (EUA). This EUA will remain in effect (meaning this test can be used) for the duration of the COVID-19 declaration under Section 564(b)(1) of the Act, 21 U.S.C. section 360bbb-3(b)(1), unless the authorization is terminated or revoked.     Resp Syncytial Virus by PCR NEGATIVE NEGATIVE Final     Comment: (NOTE) Fact Sheet for Patients: bloggercourse.com  Fact Sheet for Healthcare Providers: seriousbroker.it  This test is not yet approved or cleared by the [place]  FDA and has been authorized for detection and/or diagnosis of SARS-CoV-2 by FDA under an Emergency Use Authorization (EUA). This EUA will remain in effect (meaning this test can be used) for the duration of the COVID-19 declaration under Section 564(b)(1) of the Act, 21 U.S.C. section 360bbb-3(b)(1), unless the authorization is terminated or revoked.  Performed at [facility], [address], [place], [place] [address]   MRSA Next Gen by PCR, Nasal     Status: Abnormal   Collection Time: 11/15/24 12:06 AM   Specimen: Nasal Mucosa; Nasal Swab  Result Value Ref Range Status   MRSA by PCR Next Gen DETECTED (A) NOT DETECTED Final    Comment: RESULT CALLED TO, READ BACK BY AND VERIFIED WITH: N RONE,RN@[id number]  11/15/24 MK (NOTE) The GeneXpert MRSA Assay (FDA approved  for NASAL specimens only), is one component of a comprehensive MRSA colonization surveillance program. It is not intended to diagnose MRSA infection nor to guide or monitor treatment for MRSA infections. Test performance is not FDA approved in patients less than 60 years old. Performed at [facility], [address] [place], [place] [address]          Radiology Studies: No results found.       Scheduled Meds:  apixaban   5 mg Oral BID   ARIPiprazole   5 mg Oral Daily   atorvastatin   40 mg Oral Daily   busPIRone   10 mg Oral TID   Chlorhexidine  Gluconate Cloth  6 each Topical Daily   diltiazem   240 mg Oral Daily   doxycycline   100 mg Oral Q12H   DULoxetine   60 mg Oral Daily   feeding supplement  237 mL Oral BID BM   insulin  aspart  0-20 Units Subcutaneous Q4H   ipratropium-albuterol   3 mL Nebulization TID   letrozole   2.5 mg Oral Daily   metoprolol  tartrate  50 mg Oral BID    mupirocin  ointment  1 Application Nasal BID   mouth rinse  15 mL Mouth Rinse 4 times per day   potassium chloride   40 mEq Oral BID   rOPINIRole   1 mg Oral QHS   torsemide   40 mg Oral Daily   Continuous Infusions:  cefTRIAXone  (ROCEPHIN )  IV 2 g (11/19/24 0825)   diltiazem  (CARDIZEM ) infusion 15 mg/hr (11/19/24 1812)   magnesium  sulfate bolus IVPB 2 g (11/19/24 1813)          Derryl Duval, MD [facility] 11/19/2024, 6:16 PM   "

## 2024-11-19 NOTE — Progress Notes (Signed)
 Pt growing increasingly confused into the evening. Pt yelling out saying that she sees smoke in the room and we are trying to get her. Also, she told me that she was going to call her brother to come get her and we better hope he doesn't bring the gun. Pt educated on dangers of leaving and I explained that she may be having some hallucinations due to the infection. HR noted to be markedly higher. Sustaining in the 130's/140's. BP's in the 170's systolic. MD notifed, new orders received.

## 2024-11-20 DIAGNOSIS — J9601 Acute respiratory failure with hypoxia: Secondary | ICD-10-CM | POA: Diagnosis not present

## 2024-11-20 LAB — CBC WITH DIFFERENTIAL/PLATELET
Abs Immature Granulocytes: 0.2 10*3/uL — ABNORMAL HIGH (ref 0.00–0.07)
Basophils Absolute: 0.1 10*3/uL (ref 0.0–0.1)
Basophils Relative: 1 %
Eosinophils Absolute: 0.1 10*3/uL (ref 0.0–0.5)
Eosinophils Relative: 1 %
HCT: 28.7 % — ABNORMAL LOW (ref 36.0–46.0)
Hemoglobin: 9.6 g/dL — ABNORMAL LOW (ref 12.0–15.0)
Immature Granulocytes: 2 %
Lymphocytes Relative: 17 %
Lymphs Abs: 1.8 10*3/uL (ref 0.7–4.0)
MCH: 32.7 pg (ref 26.0–34.0)
MCHC: 33.4 g/dL (ref 30.0–36.0)
MCV: 97.6 fL (ref 80.0–100.0)
Monocytes Absolute: 1.1 10*3/uL — ABNORMAL HIGH (ref 0.1–1.0)
Monocytes Relative: 10 %
Neutro Abs: 7 10*3/uL (ref 1.7–7.7)
Neutrophils Relative %: 69 %
Platelets: 246 10*3/uL (ref 150–400)
RBC: 2.94 MIL/uL — ABNORMAL LOW (ref 3.87–5.11)
RDW: 16.6 % — ABNORMAL HIGH (ref 11.5–15.5)
WBC: 10.2 10*3/uL (ref 4.0–10.5)
nRBC: 0.4 % — ABNORMAL HIGH (ref 0.0–0.2)

## 2024-11-20 LAB — BASIC METABOLIC PANEL WITH GFR
Anion gap: 14 (ref 5–15)
BUN: 41 mg/dL — ABNORMAL HIGH (ref 8–23)
CO2: 27 mmol/L (ref 22–32)
Calcium: 9.1 mg/dL (ref 8.9–10.3)
Chloride: 98 mmol/L (ref 98–111)
Creatinine, Ser: 1.76 mg/dL — ABNORMAL HIGH (ref 0.44–1.00)
GFR, Estimated: 30 mL/min — ABNORMAL LOW
Glucose, Bld: 174 mg/dL — ABNORMAL HIGH (ref 70–99)
Potassium: 4 mmol/L (ref 3.5–5.1)
Sodium: 140 mmol/L (ref 135–145)

## 2024-11-20 LAB — GLUCOSE, CAPILLARY
Glucose-Capillary: 161 mg/dL — ABNORMAL HIGH (ref 70–99)
Glucose-Capillary: 210 mg/dL — ABNORMAL HIGH (ref 70–99)
Glucose-Capillary: 343 mg/dL — ABNORMAL HIGH (ref 70–99)
Glucose-Capillary: 228 mg/dL — ABNORMAL HIGH (ref 70–99)
Glucose-Capillary: 173 mg/dL — ABNORMAL HIGH (ref 70–99)

## 2024-11-20 LAB — MAGNESIUM: Magnesium: 2 mg/dL (ref 1.7–2.4)

## 2024-11-20 MED ORDER — AMIODARONE LOAD VIA INFUSION
150.0000 mg | Freq: Once | INTRAVENOUS | Status: AC
  Administered 2024-11-20: 150 mg via INTRAVENOUS
  Filled 2024-11-20: qty 83.34

## 2024-11-20 MED ORDER — DILTIAZEM HCL ER COATED BEADS 180 MG PO CP24
360.0000 mg | ORAL_CAPSULE | Freq: Every day | ORAL | Status: DC
  Administered 2024-11-20 – 2024-11-25 (×6): 360 mg via ORAL
  Filled 2024-11-20 (×6): qty 2

## 2024-11-20 MED ORDER — AMIODARONE HCL IN DEXTROSE 360-4.14 MG/200ML-% IV SOLN
30.0000 mg/h | INTRAVENOUS | Status: DC
  Administered 2024-11-20 – 2024-11-21 (×3): 30 mg/h via INTRAVENOUS
  Filled 2024-11-20 (×6): qty 200

## 2024-11-20 MED ORDER — GERHARDT'S BUTT CREAM
TOPICAL_CREAM | Freq: Two times a day (BID) | CUTANEOUS | Status: DC
  Administered 2024-11-20 – 2024-11-23 (×4): 1 via TOPICAL
  Filled 2024-11-20: qty 60

## 2024-11-20 MED ORDER — AMIODARONE HCL IN DEXTROSE 360-4.14 MG/200ML-% IV SOLN
60.0000 mg/h | INTRAVENOUS | Status: DC
  Administered 2024-11-20 (×2): 60 mg/h via INTRAVENOUS
  Filled 2024-11-20: qty 200

## 2024-11-20 NOTE — Consult Note (Signed)
" °  CLINICAL SUPPORT TEAM - WOUND OSTOMY AND CONTINENCE TEAM  CONSULTATION SERVICES   WOC Nurse-Inpatient Note  WOC Nurse Consult Note: Reason for Consult: buttocks  Wound type: Partial thickness r/t moisture and friction to lower buttocks  Pressure Injury POA: NA  Measurement: see nursing flowsheet  Wound bed: pink moist  Drainage (amount, consistency, odor) serosanguinous  Periwound: erythema  Dressing procedure/placement/frequency: Cleanse buttocks with soap and water, dry and apply Xeroform gauze (TI#[id number]) to open wounds daily and secure with silicone foam.   Will write for Gerhardt's Butt Cream 2 times daily to buttocks and groin.   POC discussed with bedside nurse. WOC team will not follow. Reconsult if further needs arise.   Thank you,    Delrose Rohwer MSN, RN-BC, CWOCN     "

## 2024-11-20 NOTE — Plan of Care (Signed)

## 2024-11-20 NOTE — Plan of Care (Signed)

## 2024-11-20 NOTE — Progress Notes (Signed)
 " PROGRESS NOTE    Diana Mathews  FMW:[id number] DOB: [birth year] DOA: 11/14/2024 PCP: Diana Lowers, MD   Brief Narrative:    Diana Mathews is a 74 y.o. female with medical history significant for metastatic breast cancer with mets to the bone on letrozole , paroxysmal A-fib on Eliquis , type 2 diabetes, hypertension, hyperlipidemia, iron deficiency anemia, severe morbid obesity, CKD 4, recently admitted from 11/01/2024 through 11/03/2024 for AKI on CKD 4, presents to the ER due to sudden onset shortness of breath tonight.  Her son noticed that she was having increased work of breathing and called EMS.  Upon EMS arrival, the patient was saturating 82% on room air.  She was placed on nonrebreather and brought to the ER for further evaluation.   In the ER, tachycardic and tachypneic.  The patient was placed on BiPAP due to increased work of breathing.  Imaging revealed multifocal pneumonia within the bilateral lower lobes, lingula, right middle lobe and inferior right upper lobe, airspace consolidation is most significant in the left lower lobe.    ED Course: Temperature 99.3.  BP 131/57, pulse 81, respiration rate 18 blood, O2 saturation 99% on BiPAP 50% FiO2.  Admitted for hypoxia, pneumonia, AKI on CKD, volume overload, hyponatremia, hypernatremia. Improving with antibiotics and diuresis.  Recurrent A fib RVR, started on IV Cardizem  drip from 1/23 night Patient remains in A-fib RVR despite IV Cardizem  drip, started on amiodarone  drip 1/25  Assessment & Plan:   Principal Problem:   Acute hypoxic respiratory failure (HCC) Active Problems:   Essential hypertension   Mood disorder   Fibrillation, atrial (HCC)   Malignant neoplasm metastatic to bone (HCC)   Ductal carcinoma in situ (DCIS) of right breast   Diabetes mellitus treated with injections of non-insulin  medication (HCC)   Morbid obesity (HCC)   AKI (acute kidney injury)   Pressure injury of skin   Acute hypoxic respiratory  failure secondary to below Multifocal pneumonia Volume overload  not on oxygen supplementation at baseline. Reportedly with O2 saturation of 82% on room air, on admission,  continue supplemental oxygen through nasal cannula.  Down to 4 to 6 L/min Will continue BiPAP at night and as needed during daytime, will wean down as tolerated Treat pneumonia with IV ceftriaxone , doxycycline .  Supportive care Volume off with IV Lasix ---> switched to p.o. torsemide  1/23, appreciate nephrology input.   Elevated troponin in the setting of the above, suspect demand ischemia High sensitive troponin flat 25, 25 No evidence of acute ischemia on 12 lead EKG Monitor on telemetry Echocardiogram obtained with poor study, LV function likely low normal  Hyponatremia, POA Presented with serum sodium of 117 and glucose of 354.  Corrected sodium 121, potassium 5.6. They have improved with diuresis.  Sodium 135, potassium 4.3, creatinine 2.13 on 1/23  Hyperkalemia Improved.  Acute on CKD 4, mild anion gap acidosis - Creatinine is down to 1.6.  Needs outpatient nephrology follow-up.  Initiated on torsemide .  Type 2 diabetes with hyperglycemia Presented with serum glucose of 354. Hemoglobin A1c 8.3 on 11/01/2024. Insulin  coverage in place.   Paroxysmal A-fib on Eliquis /rapid ventricular response - Patient remains in A-fib RVR despite home oral Cardizem  240 mg daily, metoprolol  50 twice daily - Started on Cardizem  drip 1/23, but rate remains uncontrolled. -Amiodarone  bolus and maintenance drip restarted 1/25 -Likely contributed by hypokalemia, hypomagnesemia and anxiety. -Replace electrolytes.  P.o. KCl, IV magnesium  -Continue Eliquis   Anemia of chronic disease Hemoglobin 9.4 No reported overt bleeding Continue to monitor  H&H.   Obesity BMI 44 Recommend weight loss outpatient with regular physical activity and healthy dieting.   Metastatic breast cancer with bone metastasis Currently on letrozole ,  prior to admission Close follow-up appointment with oncology.    Continue home medications including BuSpar , Abilify .   DVT prophylaxis: On Eliquis .   Code Status: Full code.   Family Communication:    Disposition Plan: Admitted to stepdown unit.    Admission status: Inpatient status.     Status is: Inpatient The patient requires at least 2 midnight for further evaluation and treatment of present condition.    Subjective:  Patient seen and examined at the bedside.  Remains in A-fib RVR despite IV Cardizem  drip for more than 24 hours.  Blood pressure is stable.  Started on IV amiodarone  drip.  No fever or chills.  Patient denies chest pain or shortness of breath.  Supplemental oxygen 6 L/min today, will wean down as tolerated.  Increase activity. Objective: Vitals:   11/20/24 1400 11/20/24 1430 11/20/24 1500 11/20/24 1530  BP: (!) 129/56 120/61 128/74 (!) 130/58  Pulse: (!) 106 100 99 (!) 104  Resp: (!) 23 20 (!) 23 (!) 21  Temp:      TempSrc:      SpO2: 96% 96% 96% 96%  Weight:      Height:        Intake/Output Summary (Last 24 hours) at 11/20/2024 1814 Last data filed at 11/20/2024 1755 Gross per 24 hour  Intake 1067.53 ml  Output 670 ml  Net 397.53 ml   Filed Weights   11/14/24 2020 11/14/24 2356  Weight: 107.5 kg 113.8 kg    Examination:  General:alert, oriented, not in any acute distress  chest: Bilateral crackles, no wheezing  CVS S1, S2, no murmur, A-fib RVR Abdomen: Soft, nontender Extremities: Bilateral lower extremity edema improving   Data Reviewed: I have personally reviewed following labs and imaging studies  CBC: Recent Labs  Lab 11/14/24 2039 11/16/24 0418 11/17/24 0513 11/20/24 0321  WBC 5.9 2.7* 7.6 10.2  NEUTROABS 4.7 1.9 5.9 7.0  HGB 9.4* 9.7* 9.3* 9.6*  HCT 26.9* 28.2* 26.6* 28.7*  MCV 95.1 96.2 96.0 97.6  PLT 223 172 224 246   Basic Metabolic Panel: Recent Labs  Lab 11/17/24 0513 11/17/24 0519 11/18/24 0245 11/19/24 0327  11/20/24 0321  NA 131* 131* 135 139 140  K 4.9 4.9 4.3 3.6 4.0  CL 95* 95* 97* 101 98  CO2 20* 20* 24 27 27   GLUCOSE 199* 196* 171* 224* 174*  BUN 34* 34* 38* 35* 41*  CREATININE 2.29* 2.24* 2.13* 1.60* 1.76*  CALCIUM  8.3* 8.3* 8.9 8.9 9.1  MG 1.7  --   --  1.3* 2.0  PHOS  --  3.3 2.7  --   --    GFR: Estimated Creatinine Clearance: 35.6 mL/min (A) (by C-G formula based on SCr of 1.76 mg/dL (H)). Liver Function Tests: Recent Labs  Lab 11/15/24 0941 11/17/24 0519 11/18/24 0245  ALBUMIN 3.6 3.2* 3.4*   No results for input(s): LIPASE, AMYLASE in the last 168 hours. No results for input(s): AMMONIA in the last 168 hours. Coagulation Profile: No results for input(s): INR, PROTIME in the last 168 hours. Cardiac Enzymes: No results for input(s): CKTOTAL, CKMB, CKMBINDEX, TROPONINI in the last 168 hours. BNP (last 3 results) Recent Labs    11/14/24 2039  PROBNP 814.0*   HbA1C: No results for input(s): HGBA1C in the last 72 hours. CBG: Recent Labs  Lab 11/19/24  2317 11/20/24 0333 11/20/24 0726 11/20/24 1136 11/20/24 1613  GLUCAP 137* 161* 210* 343* 228*   Lipid Profile: No results for input(s): CHOL, HDL, LDLCALC, TRIG, CHOLHDL, LDLDIRECT in the last 72 hours. Thyroid  Function Tests: No results for input(s): TSH, T4TOTAL, FREET4, T3FREE, THYROIDAB in the last 72 hours. Anemia Panel: No results for input(s): VITAMINB12, FOLATE, FERRITIN, TIBC, IRON, RETICCTPCT in the last 72 hours. Sepsis Labs: No results for input(s): PROCALCITON, LATICACIDVEN in the last 168 hours.  Recent Results (from the past 240 hours)  Resp panel by RT-PCR (RSV, Flu A&B, Covid) Anterior Nasal Swab     Status: None   Collection Time: 11/14/24  8:39 PM   Specimen: Anterior Nasal Swab  Result Value Ref Range Status   SARS Coronavirus 2 by RT PCR NEGATIVE NEGATIVE Final    Comment: (NOTE) SARS-CoV-2 target nucleic acids are NOT  DETECTED.  The SARS-CoV-2 RNA is generally detectable in upper respiratory specimens during the acute phase of infection. The lowest concentration of SARS-CoV-2 viral copies this assay can detect is 138 copies/mL. A negative result does not preclude SARS-Cov-2 infection and should not be used as the sole basis for treatment or other patient management decisions. A negative result may occur with  improper specimen collection/handling, submission of specimen other than nasopharyngeal swab, presence of viral mutation(s) within the areas targeted by this assay, and inadequate number of viral copies(<138 copies/mL). A negative result must be combined with clinical observations, patient history, and epidemiological information. The expected result is Negative.  Fact Sheet for Patients:  bloggercourse.com  Fact Sheet for Healthcare Providers:  seriousbroker.it  This test is no t yet approved or cleared by the [place]  FDA and  has been authorized for detection and/or diagnosis of SARS-CoV-2 by FDA under an Emergency Use Authorization (EUA). This EUA will remain  in effect (meaning this test can be used) for the duration of the COVID-19 declaration under Section 564(b)(1) of the Act, 21 U.S.C.section 360bbb-3(b)(1), unless the authorization is terminated  or revoked sooner.       Influenza A by PCR NEGATIVE NEGATIVE Final   Influenza B by PCR NEGATIVE NEGATIVE Final    Comment: (NOTE) The Xpert Xpress SARS-CoV-2/FLU/RSV plus assay is intended as an aid in the diagnosis of influenza from Nasopharyngeal swab specimens and should not be used as a sole basis for treatment. Nasal washings and aspirates are unacceptable for Xpert Xpress SARS-CoV-2/FLU/RSV testing.  Fact Sheet for Patients: bloggercourse.com  Fact Sheet for Healthcare Providers: seriousbroker.it  This test is not yet  approved or cleared by the [place]  FDA and has been authorized for detection and/or diagnosis of SARS-CoV-2 by FDA under an Emergency Use Authorization (EUA). This EUA will remain in effect (meaning this test can be used) for the duration of the COVID-19 declaration under Section 564(b)(1) of the Act, 21 U.S.C. section 360bbb-3(b)(1), unless the authorization is terminated or revoked.     Resp Syncytial Virus by PCR NEGATIVE NEGATIVE Final    Comment: (NOTE) Fact Sheet for Patients: bloggercourse.com  Fact Sheet for Healthcare Providers: seriousbroker.it  This test is not yet approved or cleared by the [place]  FDA and has been authorized for detection and/or diagnosis of SARS-CoV-2 by FDA under an Emergency Use Authorization (EUA). This EUA will remain in effect (meaning this test can be used) for the duration of the COVID-19 declaration under Section 564(b)(1) of the Act, 21 U.S.C. section 360bbb-3(b)(1), unless the authorization is terminated or revoked.  Performed at [facility]  [facility], [address], [place], [place] [address]   MRSA Next Gen by PCR, Nasal     Status: Abnormal   Collection Time: 11/15/24 12:06 AM   Specimen: Nasal Mucosa; Nasal Swab  Result Value Ref Range Status   MRSA by PCR Next Gen DETECTED (A) NOT DETECTED Final    Comment: RESULT CALLED TO, READ BACK BY AND VERIFIED WITH: N RONE,RN@[id number]  11/15/24 MK (NOTE) The GeneXpert MRSA Assay (FDA approved for NASAL specimens only), is one component of a comprehensive MRSA colonization surveillance program. It is not intended to diagnose MRSA infection nor to guide or monitor treatment for MRSA infections. Test performance is not FDA approved in patients less than 35 years old. Performed at [facility], [address] [place], [place] [address]          Radiology Studies: No results found.       Scheduled Meds:  apixaban   5 mg Oral BID    ARIPiprazole   5 mg Oral Daily   atorvastatin   40 mg Oral Daily   busPIRone   10 mg Oral TID   Chlorhexidine  Gluconate Cloth  6 each Topical Daily   diltiazem   360 mg Oral Daily   doxycycline   100 mg Oral Q12H   DULoxetine   60 mg Oral Daily   feeding supplement  237 mL Oral BID BM   Gerhardt's butt cream   Topical BID   insulin  aspart  0-20 Units Subcutaneous Q4H   ipratropium-albuterol   3 mL Nebulization TID   letrozole   2.5 mg Oral Daily   metoprolol  tartrate  50 mg Oral BID   mouth rinse  15 mL Mouth Rinse 4 times per day   rOPINIRole   1 mg Oral QHS   torsemide   40 mg Oral Daily   Continuous Infusions:  amiodarone  30 mg/hr (11/20/24 1536)          Derryl Duval, MD [facility] 11/20/2024, 6:14 PM   "

## 2024-11-21 DIAGNOSIS — J9601 Acute respiratory failure with hypoxia: Secondary | ICD-10-CM | POA: Diagnosis not present

## 2024-11-21 LAB — GLUCOSE, CAPILLARY
Glucose-Capillary: 161 mg/dL — ABNORMAL HIGH (ref 70–99)
Glucose-Capillary: 162 mg/dL — ABNORMAL HIGH (ref 70–99)
Glucose-Capillary: 168 mg/dL — ABNORMAL HIGH (ref 70–99)
Glucose-Capillary: 181 mg/dL — ABNORMAL HIGH (ref 70–99)
Glucose-Capillary: 181 mg/dL — ABNORMAL HIGH (ref 70–99)
Glucose-Capillary: 208 mg/dL — ABNORMAL HIGH (ref 70–99)
Glucose-Capillary: 212 mg/dL — ABNORMAL HIGH (ref 70–99)
Glucose-Capillary: 253 mg/dL — ABNORMAL HIGH (ref 70–99)

## 2024-11-21 LAB — BASIC METABOLIC PANEL WITH GFR
Anion gap: 16 — ABNORMAL HIGH (ref 5–15)
BUN: 52 mg/dL — ABNORMAL HIGH (ref 8–23)
CO2: 28 mmol/L (ref 22–32)
Calcium: 8.5 mg/dL — ABNORMAL LOW (ref 8.9–10.3)
Chloride: 98 mmol/L (ref 98–111)
Creatinine, Ser: 1.82 mg/dL — ABNORMAL HIGH (ref 0.44–1.00)
GFR, Estimated: 29 mL/min — ABNORMAL LOW
Glucose, Bld: 164 mg/dL — ABNORMAL HIGH (ref 70–99)
Potassium: 3.6 mmol/L (ref 3.5–5.1)
Sodium: 142 mmol/L (ref 135–145)

## 2024-11-21 LAB — CBC WITH DIFFERENTIAL/PLATELET
Abs Immature Granulocytes: 0.17 10*3/uL — ABNORMAL HIGH (ref 0.00–0.07)
Basophils Absolute: 0.1 10*3/uL (ref 0.0–0.1)
Basophils Relative: 1 %
Eosinophils Absolute: 0.3 10*3/uL (ref 0.0–0.5)
Eosinophils Relative: 3 %
HCT: 27.5 % — ABNORMAL LOW (ref 36.0–46.0)
Hemoglobin: 9.3 g/dL — ABNORMAL LOW (ref 12.0–15.0)
Immature Granulocytes: 2 %
Lymphocytes Relative: 15 %
Lymphs Abs: 1.4 10*3/uL (ref 0.7–4.0)
MCH: 33.3 pg (ref 26.0–34.0)
MCHC: 33.8 g/dL (ref 30.0–36.0)
MCV: 98.6 fL (ref 80.0–100.0)
Monocytes Absolute: 0.8 10*3/uL (ref 0.1–1.0)
Monocytes Relative: 8 %
Neutro Abs: 6.7 10*3/uL (ref 1.7–7.7)
Neutrophils Relative %: 71 %
Platelets: 229 10*3/uL (ref 150–400)
RBC: 2.79 MIL/uL — ABNORMAL LOW (ref 3.87–5.11)
RDW: 16.4 % — ABNORMAL HIGH (ref 11.5–15.5)
WBC: 9.4 10*3/uL (ref 4.0–10.5)
nRBC: 0 % (ref 0.0–0.2)

## 2024-11-21 LAB — MAGNESIUM: Magnesium: 1.8 mg/dL (ref 1.7–2.4)

## 2024-11-21 MED ORDER — POTASSIUM CHLORIDE CRYS ER 20 MEQ PO TBCR
40.0000 meq | EXTENDED_RELEASE_TABLET | Freq: Once | ORAL | Status: AC
  Administered 2024-11-21: 40 meq via ORAL
  Filled 2024-11-21: qty 2

## 2024-11-21 MED ORDER — MAGNESIUM OXIDE -MG SUPPLEMENT 400 (240 MG) MG PO TABS
200.0000 mg | ORAL_TABLET | Freq: Every day | ORAL | Status: AC
  Administered 2024-11-21 – 2024-11-25 (×5): 200 mg via ORAL
  Filled 2024-11-21 (×5): qty 1

## 2024-11-21 NOTE — Inpatient Diabetes Management (Signed)
 Inpatient Diabetes Program Recommendations  AACE/ADA: New Consensus Statement on Inpatient Glycemic Control   Target Ranges:  Prepandial:   less than 140 mg/dL      Peak postprandial:   less than 180 mg/dL (1-2 hours)      Critically ill patients:  140 - 180 mg/dL    Latest Reference Range & Units 11/20/24 03:33 11/20/24 07:26 11/20/24 11:36 11/20/24 16:13 11/20/24 20:31 11/21/24 00:07 11/21/24 03:54  Glucose-Capillary 70 - 99 mg/dL 838 (H) 789 (H) 656 (H) 228 (H) 173 (H) 162 (H) 168 (H)   Review of Glycemic Control  Diabetes history: DM2 Outpatient Diabetes medications: Metformin  750 mg BID, Mounjaro  7.5 mg Qweek Current orders for Inpatient glycemic control: Novolog  0-20 units Q4H  Inpatient Diabetes Program Recommendations:    Insulin : Please consider ordering Semglee  12 units Q24H.  Thanks, Earnie Gainer, RN, MSN, CDCES Diabetes Coordinator Inpatient Diabetes Program [phone] (Team Pager from 8am to 5pm)

## 2024-11-21 NOTE — Plan of Care (Signed)
" °  Problem: Education: Goal: Knowledge of General Education information will improve Description: Including pain rating scale, medication(s)/side effects and non-pharmacologic comfort measures Outcome: Progressing   Problem: Health Behavior/Discharge Planning: Goal: Ability to manage health-related needs will improve Outcome: Progressing   Problem: Clinical Measurements: Goal: Ability to maintain clinical measurements within normal limits will improve Outcome: Progressing Goal: Will remain free from infection Outcome: Progressing Goal: Diagnostic test results will improve Outcome: Progressing Goal: Respiratory complications will improve Outcome: Progressing Goal: Cardiovascular complication will be avoided Outcome: Progressing   Problem: Activity: Goal: Risk for activity intolerance will decrease Outcome: Not Progressing   Problem: Nutrition: Goal: Adequate nutrition will be maintained Outcome: Not Progressing   Problem: Coping: Goal: Level of anxiety will decrease Outcome: Progressing   Problem: Elimination: Goal: Will not experience complications related to bowel motility Outcome: Progressing Goal: Will not experience complications related to urinary retention Outcome: Progressing   Problem: Pain Managment: Goal: General experience of comfort will improve and/or be controlled Outcome: Progressing   Problem: Safety: Goal: Ability to remain free from injury will improve Outcome: Progressing   Problem: Skin Integrity: Goal: Risk for impaired skin integrity will decrease Outcome: Progressing   Problem: Coping: Goal: Ability to adjust to condition or change in health will improve Outcome: Progressing   Problem: Fluid Volume: Goal: Ability to maintain a balanced intake and output will improve Outcome: Progressing   Problem: Health Behavior/Discharge Planning: Goal: Ability to identify and utilize available resources and services will improve Outcome:  Progressing Goal: Ability to manage health-related needs will improve Outcome: Progressing   Problem: Nutritional: Goal: Maintenance of adequate nutrition will improve Outcome: Not Progressing Goal: Progress toward achieving an optimal weight will improve Outcome: Not Progressing   Problem: Metabolic: Goal: Ability to maintain appropriate glucose levels will improve Outcome: Progressing   Problem: Skin Integrity: Goal: Risk for impaired skin integrity will decrease Outcome: Progressing   Problem: Tissue Perfusion: Goal: Adequacy of tissue perfusion will improve Outcome: Progressing   "

## 2024-11-21 NOTE — Progress Notes (Signed)
 " PROGRESS NOTE    Diana Mathews  FMW:[id number] DOB: 1951/04/30 DOA: 11/14/2024 PCP: Zollie Lowers, MD   Brief Narrative:    Diana Mathews is a 74 y.o. female with medical history significant for metastatic breast cancer with mets to the bone on letrozole , paroxysmal A-fib on Eliquis , type 2 diabetes, hypertension, hyperlipidemia, iron deficiency anemia, severe morbid obesity, CKD 4, recently admitted from 11/01/2024 through 11/03/2024 for AKI on CKD 4, presents to the ER due to sudden onset shortness of breath tonight.  Her son noticed that she was having increased work of breathing and called EMS.  Upon EMS arrival, the patient was saturating 82% on room air.  She was placed on nonrebreather and brought to the ER for further evaluation.   In the ER, tachycardic and tachypneic.  The patient was placed on BiPAP due to increased work of breathing.  Imaging revealed multifocal pneumonia within the bilateral lower lobes, lingula, right middle lobe and inferior right upper lobe, airspace consolidation is most significant in the left lower lobe.    ED Course: Temperature 99.3.  BP 131/57, pulse 81, respiration rate 18 blood, O2 saturation 99% on BiPAP 50% FiO2.  Admitted for hypoxia, pneumonia, AKI on CKD, volume overload, hyponatremia, hypernatremia. Improving with antibiotics and diuresis.  Recurrent A fib RVR, started on IV Cardizem  drip from 1/23 night Patient remains in A-fib RVR despite IV Cardizem  drip, started on amiodarone  drip 1/25 Heart rate improved, patient remains in A-fib.  Assessment & Plan:   Principal Problem:   Acute hypoxic respiratory failure (HCC) Active Problems:   Essential hypertension   Mood disorder   Fibrillation, atrial (HCC)   Malignant neoplasm metastatic to bone (HCC)   Ductal carcinoma in situ (DCIS) of right breast   Diabetes mellitus treated with injections of non-insulin  medication (HCC)   Morbid obesity (HCC)   AKI (acute kidney injury)   Pressure  injury of skin   Acute hypoxic respiratory failure secondary to below Multifocal pneumonia Volume overload  not on oxygen supplementation at baseline. Reportedly with O2 saturation of 82% on room air, on admission,  continue supplemental oxygen through nasal cannula.  Down to 4 to 6 L/min.  Out of bed, PT, incentive spirometry Will continue BiPAP at night and as needed during daytime, will wean down as tolerated Completed IV ceftriaxone  and doxycycline  for 7 days for pneumonia.   Volume off with IV Lasix ---> switched to p.o. torsemide  1/23, appreciate nephrology input.   Elevated troponin in the setting of the above, suspect demand ischemia High sensitive troponin flat 25, 25 No evidence of acute ischemia on 12 lead EKG Monitor on telemetry Echocardiogram obtained with poor study, LV function likely low normal  Hyponatremia, POA Presented with serum sodium of 117 and glucose of 354.  Corrected sodium 121, potassium 5.6. They have improved with diuresis.  Sodium 142, potassium 3.6, creatinine 1.82 on 1/26.  Hyperkalemia Improved.  Acute on CKD 4, mild anion gap acidosis - Creatinine is down to 1.6.  Needs outpatient nephrology follow-up.  Initiated on torsemide .  Type 2 diabetes with hyperglycemia Presented with serum glucose of 354. Hemoglobin A1c 8.3 on 11/01/2024. Insulin  coverage in place.   Paroxysmal A-fib on Eliquis /rapid ventricular response - Patient remains in A-fib RVR despite home oral Cardizem  240 mg daily, metoprolol  50 twice daily - Started on Cardizem  drip 1/23, but rate remains uncontrolled. -Amiodarone  bolus and maintenance drip restarted 1/25 -Likely contributed by hypokalemia, hypomagnesemia and anxiety. -Replace electrolytes.  P.o. KCl, IV  magnesium  -Continue Eliquis   Anemia of chronic disease Hemoglobin 9.4 No reported overt bleeding Continue to monitor H&H.   Obesity BMI 44 Recommend weight loss outpatient with regular physical activity and healthy  dieting.   Metastatic breast cancer with bone metastasis Currently on letrozole , prior to admission Close follow-up appointment with oncology.    Continue home medications including BuSpar , Abilify .   DVT prophylaxis: On Eliquis .   Code Status: Full code.   Family Communication:    Disposition Plan: Admitted to stepdown unit.    Admission status: Inpatient status.     Status is: Inpatient The patient requires at least 2 midnight for further evaluation and treatment of present condition.    Subjective:  Patient seen and examined at the bedside.  Remains in A-fib, rate controlled, continues to be on IV amiodarone .  Patient denies chest pain and shortness of breath.  Vital signs stable.  Still hypoxic needing oxygen 4 L/min.  objective: Vitals:   11/21/24 1100 11/21/24 1139 11/21/24 1200 11/21/24 1300  BP: (!) 140/61  (!) 154/56 134/68  Pulse: 90  91 91  Resp: 19  (!) 23 18  Temp:  98.9 F (37.2 C)    TempSrc:  Oral    SpO2: 93%  92% 92%  Weight:      Height:        Intake/Output Summary (Last 24 hours) at 11/21/2024 1521 Last data filed at 11/21/2024 1510 Gross per 24 hour  Intake 772.57 ml  Output 1500 ml  Net -727.43 ml   Filed Weights   11/14/24 2020 11/14/24 2356 11/21/24 0356  Weight: 107.5 kg 113.8 kg 103.4 kg    Examination:  General:alert, oriented, not in any acute distress  chest: Bilateral crackles, no wheezing  CVS S1, S2, no murmur, A-fib RVR Abdomen: Soft, nontender Extremities: Bilateral lower extremity edema improving   Data Reviewed: I have personally reviewed following labs and imaging studies  CBC: Recent Labs  Lab 11/14/24 2039 11/16/24 0418 11/17/24 0513 11/20/24 0321 11/21/24 0341  WBC 5.9 2.7* 7.6 10.2 9.4  NEUTROABS 4.7 1.9 5.9 7.0 6.7  HGB 9.4* 9.7* 9.3* 9.6* 9.3*  HCT 26.9* 28.2* 26.6* 28.7* 27.5*  MCV 95.1 96.2 96.0 97.6 98.6  PLT 223 172 224 246 229   Basic Metabolic Panel: Recent Labs  Lab 11/17/24 0513  11/17/24 0519 11/18/24 0245 11/19/24 0327 11/20/24 0321 11/21/24 0341  NA 131* 131* 135 139 140 142  K 4.9 4.9 4.3 3.6 4.0 3.6  CL 95* 95* 97* 101 98 98  CO2 20* 20* 24 27 27 28   GLUCOSE 199* 196* 171* 224* 174* 164*  BUN 34* 34* 38* 35* 41* 52*  CREATININE 2.29* 2.24* 2.13* 1.60* 1.76* 1.82*  CALCIUM  8.3* 8.3* 8.9 8.9 9.1 8.5*  MG 1.7  --   --  1.3* 2.0 1.8  PHOS  --  3.3 2.7  --   --   --    GFR: Estimated Creatinine Clearance: 32.6 mL/min (A) (by C-G formula based on SCr of 1.82 mg/dL (H)). Liver Function Tests: Recent Labs  Lab 11/15/24 0941 11/17/24 0519 11/18/24 0245  ALBUMIN 3.6 3.2* 3.4*   No results for input(s): LIPASE, AMYLASE in the last 168 hours. No results for input(s): AMMONIA in the last 168 hours. Coagulation Profile: No results for input(s): INR, PROTIME in the last 168 hours. Cardiac Enzymes: No results for input(s): CKTOTAL, CKMB, CKMBINDEX, TROPONINI in the last 168 hours. BNP (last 3 results) Recent Labs    11/14/24 2039  PROBNP 814.0*   HbA1C: No results for input(s): HGBA1C in the last 72 hours. CBG: Recent Labs  Lab 11/20/24 2031 11/21/24 0007 11/21/24 0354 11/21/24 0752 11/21/24 1131  GLUCAP 173* 162* 168* 181* 212*   Lipid Profile: No results for input(s): CHOL, HDL, LDLCALC, TRIG, CHOLHDL, LDLDIRECT in the last 72 hours. Thyroid  Function Tests: No results for input(s): TSH, T4TOTAL, FREET4, T3FREE, THYROIDAB in the last 72 hours. Anemia Panel: No results for input(s): VITAMINB12, FOLATE, FERRITIN, TIBC, IRON, RETICCTPCT in the last 72 hours. Sepsis Labs: No results for input(s): PROCALCITON, LATICACIDVEN in the last 168 hours.  Recent Results (from the past 240 hours)  Resp panel by RT-PCR (RSV, Flu A&B, Covid) Anterior Nasal Swab     Status: None   Collection Time: 11/14/24  8:39 PM   Specimen: Anterior Nasal Swab  Result Value Ref Range Status   SARS Coronavirus 2  by RT PCR NEGATIVE NEGATIVE Final    Comment: (NOTE) SARS-CoV-2 target nucleic acids are NOT DETECTED.  The SARS-CoV-2 RNA is generally detectable in upper respiratory specimens during the acute phase of infection. The lowest concentration of SARS-CoV-2 viral copies this assay can detect is 138 copies/mL. A negative result does not preclude SARS-Cov-2 infection and should not be used as the sole basis for treatment or other patient management decisions. A negative result may occur with  improper specimen collection/handling, submission of specimen other than nasopharyngeal swab, presence of viral mutation(s) within the areas targeted by this assay, and inadequate number of viral copies(<138 copies/mL). A negative result must be combined with clinical observations, patient history, and epidemiological information. The expected result is Negative.  Fact Sheet for Patients:  bloggercourse.com  Fact Sheet for Healthcare Providers:  seriousbroker.it  This test is no t yet approved or cleared by the [place]  FDA and  has been authorized for detection and/or diagnosis of SARS-CoV-2 by FDA under an Emergency Use Authorization (EUA). This EUA will remain  in effect (meaning this test can be used) for the duration of the COVID-19 declaration under Section 564(b)(1) of the Act, 21 U.S.C.section 360bbb-3(b)(1), unless the authorization is terminated  or revoked sooner.       Influenza A by PCR NEGATIVE NEGATIVE Final   Influenza B by PCR NEGATIVE NEGATIVE Final    Comment: (NOTE) The Xpert Xpress SARS-CoV-2/FLU/RSV plus assay is intended as an aid in the diagnosis of influenza from Nasopharyngeal swab specimens and should not be used as a sole basis for treatment. Nasal washings and aspirates are unacceptable for Xpert Xpress SARS-CoV-2/FLU/RSV testing.  Fact Sheet for Patients: bloggercourse.com  Fact  Sheet for Healthcare Providers: seriousbroker.it  This test is not yet approved or cleared by the [place]  FDA and has been authorized for detection and/or diagnosis of SARS-CoV-2 by FDA under an Emergency Use Authorization (EUA). This EUA will remain in effect (meaning this test can be used) for the duration of the COVID-19 declaration under Section 564(b)(1) of the Act, 21 U.S.C. section 360bbb-3(b)(1), unless the authorization is terminated or revoked.     Resp Syncytial Virus by PCR NEGATIVE NEGATIVE Final    Comment: (NOTE) Fact Sheet for Patients: bloggercourse.com  Fact Sheet for Healthcare Providers: seriousbroker.it  This test is not yet approved or cleared by the [place]  FDA and has been authorized for detection and/or diagnosis of SARS-CoV-2 by FDA under an Emergency Use Authorization (EUA). This EUA will remain in effect (meaning this test can be used) for the duration of the COVID-19 declaration  under Section 564(b)(1) of the Act, 21 U.S.C. section 360bbb-3(b)(1), unless the authorization is terminated or revoked.  Performed at [facility], [address] [place], [place] [address]   MRSA Next Gen by PCR, Nasal     Status: Abnormal   Collection Time: 11/15/24 12:06 AM   Specimen: Nasal Mucosa; Nasal Swab  Result Value Ref Range Status   MRSA by PCR Next Gen DETECTED (A) NOT DETECTED Final    Comment: RESULT CALLED TO, READ BACK BY AND VERIFIED WITH: N RONE,RN@0338  11/15/24 MK (NOTE) The GeneXpert MRSA Assay (FDA approved for NASAL specimens only), is one component of a comprehensive MRSA colonization surveillance program. It is not intended to diagnose MRSA infection nor to guide or monitor treatment for MRSA infections. Test performance is not FDA approved in patients less than 74 years old. Performed at [facility], [address] [place], [place] [address]           Radiology Studies: No results found.       Scheduled Meds:  apixaban   5 mg Oral BID   ARIPiprazole   5 mg Oral Daily   atorvastatin   40 mg Oral Daily   busPIRone   10 mg Oral TID   Chlorhexidine  Gluconate Cloth  6 each Topical Daily   diltiazem   360 mg Oral Daily   DULoxetine   60 mg Oral Daily   feeding supplement  237 mL Oral BID BM   Gerhardt's butt cream   Topical BID   insulin  aspart  0-20 Units Subcutaneous Q4H   ipratropium-albuterol   3 mL Nebulization TID   letrozole   2.5 mg Oral Daily   magnesium  oxide  200 mg Oral Daily   metoprolol  tartrate  50 mg Oral BID   mouth rinse  15 mL Mouth Rinse 4 times per day   rOPINIRole   1 mg Oral QHS   torsemide   40 mg Oral Daily   Continuous Infusions:  amiodarone  30 mg/hr (11/21/24 1510)          Derryl Duval, MD [facility] 11/21/2024, 3:21 PM   "

## 2024-11-21 NOTE — TOC Progression Note (Signed)
 Transition of Care [facility]) - Progression Note    Patient Details  Name: Diana Mathews MRN: [id number] Date of Birth: January 20, 1951  Transition of [facility] CM/SW Contact  Lucie Lunger, [place] Phone Number: 11/21/2024, 10:27 AM  Clinical Narrative:    Plan will still be for SNF at [facility] once medically stable. CSW following and will start SNF auth once being closer to medical stability for D/C. TOC to follow.   Expected Discharge Plan: Skilled Nursing Facility Barriers to Discharge: Continued Medical Work up               Expected Discharge Plan and Services In-house Referral: Clinical Social Work Discharge Planning Services: CM Consult Post Acute Care Choice: Durable Medical Equipment Living arrangements for the past 2 months: Apartment                                       Social Drivers of Health (SDOH) Interventions SDOH Screenings   Food Insecurity: Patient Unable To Answer (11/15/2024)  Recent Concern: Food Insecurity - Food Insecurity Present (10/05/2024)  Housing: Unknown (11/15/2024)  Transportation Needs: Patient Unable To Answer (11/15/2024)  Recent Concern: Transportation Needs - Unmet Transportation Needs (10/05/2024)  Utilities: Patient Unable To Answer (11/15/2024)  Alcohol  Screen: Low Risk (07/04/2024)  Depression (PHQ2-9): Medium Risk (10/06/2024)  Financial Resource Strain: Low Risk (10/05/2024)  Physical Activity: Sufficiently Active (10/05/2024)  Social Connections: Unknown (11/15/2024)  Recent Concern: Social Connections - Socially Isolated (11/01/2024)  Stress: Stress Concern Present (10/05/2024)  Tobacco Use: Medium Risk (11/14/2024)  Health Literacy: Adequate Health Literacy (07/04/2024)    Readmission Risk Interventions    11/17/2024   12:06 PM 11/16/2024    7:50 AM 11/15/2024   10:43 AM  Readmission Risk Prevention Plan  Transportation Screening Complete Complete Complete  Home Care Screening   Complete  Medication Review (RN  CM)   Complete  HRI or Home Care Consult Complete Complete   Social Work Consult for Recovery Care Planning/Counseling Complete Complete   Palliative Care Screening Not Applicable Not Applicable   Medication Review Oceanographer) Complete Complete

## 2024-11-22 ENCOUNTER — Encounter (HOSPITAL_COMMUNITY): Payer: Self-pay | Admitting: Internal Medicine

## 2024-11-22 DIAGNOSIS — N183 Chronic kidney disease, stage 3 unspecified: Secondary | ICD-10-CM | POA: Diagnosis not present

## 2024-11-22 DIAGNOSIS — N179 Acute kidney failure, unspecified: Secondary | ICD-10-CM

## 2024-11-22 DIAGNOSIS — J9601 Acute respiratory failure with hypoxia: Secondary | ICD-10-CM | POA: Diagnosis not present

## 2024-11-22 DIAGNOSIS — I5030 Unspecified diastolic (congestive) heart failure: Secondary | ICD-10-CM

## 2024-11-22 DIAGNOSIS — I480 Paroxysmal atrial fibrillation: Secondary | ICD-10-CM | POA: Diagnosis not present

## 2024-11-22 LAB — CBC WITH DIFFERENTIAL/PLATELET
Abs Immature Granulocytes: 0.16 10*3/uL — ABNORMAL HIGH (ref 0.00–0.07)
Basophils Absolute: 0.1 10*3/uL (ref 0.0–0.1)
Basophils Relative: 1 %
Eosinophils Absolute: 0.3 10*3/uL (ref 0.0–0.5)
Eosinophils Relative: 3 %
HCT: 28.9 % — ABNORMAL LOW (ref 36.0–46.0)
Hemoglobin: 9.7 g/dL — ABNORMAL LOW (ref 12.0–15.0)
Immature Granulocytes: 2 %
Lymphocytes Relative: 14 %
Lymphs Abs: 1.4 10*3/uL (ref 0.7–4.0)
MCH: 32.9 pg (ref 26.0–34.0)
MCHC: 33.6 g/dL (ref 30.0–36.0)
MCV: 98 fL (ref 80.0–100.0)
Monocytes Absolute: 0.7 10*3/uL (ref 0.1–1.0)
Monocytes Relative: 7 %
Neutro Abs: 7.5 10*3/uL (ref 1.7–7.7)
Neutrophils Relative %: 73 %
Platelets: 239 10*3/uL (ref 150–400)
RBC: 2.95 MIL/uL — ABNORMAL LOW (ref 3.87–5.11)
RDW: 16.8 % — ABNORMAL HIGH (ref 11.5–15.5)
WBC: 10.1 10*3/uL (ref 4.0–10.5)
nRBC: 0.2 % (ref 0.0–0.2)

## 2024-11-22 LAB — BASIC METABOLIC PANEL WITH GFR
Anion gap: 13 (ref 5–15)
BUN: 49 mg/dL — ABNORMAL HIGH (ref 8–23)
CO2: 30 mmol/L (ref 22–32)
Calcium: 8.6 mg/dL — ABNORMAL LOW (ref 8.9–10.3)
Chloride: 98 mmol/L (ref 98–111)
Creatinine, Ser: 1.64 mg/dL — ABNORMAL HIGH (ref 0.44–1.00)
GFR, Estimated: 33 mL/min — ABNORMAL LOW
Glucose, Bld: 191 mg/dL — ABNORMAL HIGH (ref 70–99)
Potassium: 3.8 mmol/L (ref 3.5–5.1)
Sodium: 141 mmol/L (ref 135–145)

## 2024-11-22 LAB — GLUCOSE, CAPILLARY
Glucose-Capillary: 160 mg/dL — ABNORMAL HIGH (ref 70–99)
Glucose-Capillary: 161 mg/dL — ABNORMAL HIGH (ref 70–99)
Glucose-Capillary: 162 mg/dL — ABNORMAL HIGH (ref 70–99)
Glucose-Capillary: 170 mg/dL — ABNORMAL HIGH (ref 70–99)
Glucose-Capillary: 186 mg/dL — ABNORMAL HIGH (ref 70–99)
Glucose-Capillary: 204 mg/dL — ABNORMAL HIGH (ref 70–99)
Glucose-Capillary: 232 mg/dL — ABNORMAL HIGH (ref 70–99)
Glucose-Capillary: 284 mg/dL — ABNORMAL HIGH (ref 70–99)

## 2024-11-22 LAB — MAGNESIUM: Magnesium: 1.8 mg/dL (ref 1.7–2.4)

## 2024-11-22 MED ORDER — SPIRONOLACTONE 25 MG PO TABS
25.0000 mg | ORAL_TABLET | Freq: Every day | ORAL | Status: DC
  Administered 2024-11-22 – 2024-11-25 (×4): 25 mg via ORAL
  Filled 2024-11-22 (×4): qty 1

## 2024-11-22 NOTE — Consult Note (Signed)
 "   CARDIOLOGY CONSULTATION  Patient ID: Diana Mathews; [id number]; 18-Apr-1951   Admit date: 11/14/2024 Date of Consult: 11/22/2024  Primary Care Provider: Zollie Lowers, MD Primary Cardiologist: Earla Maude Currier, MD [facility] Cardiology)  HISTORY OF PRESENT ILLNESS  [name] is a 74 y.o. female currently hospitalized with hypoxic respiratory failure in the setting of multifocal pneumonia complicated by relative fluid overload.  She has HFpEF at baseline, also paroxysmal atrial fibrillation and atrial tachycardia currently followed by [facility] cardiology.  She developed breakthrough rapid atrial fibrillation over the weekend, initially started on intravenous diltiazem  and ultimately intravenous amiodarone , currently back in sinus rhythm.  Her baseline outpatient cardiac regimen includes Eliquis , Cardizem  CD, and metoprolol .  Echocardiogram obtained during this admission showed LVEF low normal range with mild LVH.  She had a net urine output of approximately 1000 cc last 24 hours.  ROS  Pertinent review in history of present illness.  Intermittent cough.  Past Medical History:  Diagnosis Date   (HFpEF) heart failure with preserved ejection fraction (HCC)    Atrial fibrillation (HCC)    Atrial tachycardia    Depression    Essential hypertension    Headache    Hyperlipidemia    Metastatic breast cancer    Left breast, metastatic to bone and with pulmonary nodules - [facility] oncology   Osteoarthritis    Sleep apnea    TIA (transient ischemic attack)    Type 2 diabetes mellitus (HCC)     Past Surgical History:  Procedure Laterality Date   ABDOMINAL HYSTERECTOMY     CHOLECYSTECTOMY     FOOT SURGERY Left    HERNIA REPAIR     TUBAL LIGATION     WRIST SURGERY Bilateral    Carpal Tunnel     INPATIENT MEDICATIONS Scheduled Meds:  apixaban   5 mg Oral BID   ARIPiprazole   5 mg Oral Daily   atorvastatin   40 mg Oral Daily   busPIRone   10 mg Oral TID   Chlorhexidine  Gluconate Cloth   6 each Topical Daily   diltiazem   360 mg Oral Daily   DULoxetine   60 mg Oral Daily   feeding supplement  237 mL Oral BID BM   Gerhardt's butt cream   Topical BID   insulin  aspart  0-20 Units Subcutaneous Q4H   ipratropium-albuterol   3 mL Nebulization TID   letrozole   2.5 mg Oral Daily   magnesium  oxide  200 mg Oral Daily   metoprolol  tartrate  50 mg Oral BID   mouth rinse  15 mL Mouth Rinse 4 times per day   rOPINIRole   1 mg Oral QHS   spironolactone   25 mg Oral Daily   torsemide   40 mg Oral Daily   Continuous Infusions:  amiodarone  30 mg/hr (11/22/24 0754)   PRN Meds: acetaminophen , guaiFENesin -dextromethorphan , ipratropium-albuterol , melatonin, mouth rinse, phenol, polyethylene glycol, prochlorperazine , traZODone   ALLERGIES Allergies[1]  SOCIAL HISTORY  Social History   Tobacco Use   Smoking status: Former    Current packs/day: 0.00    Types: Cigarettes    Quit date: 01/09/1985    Years since quitting: 39.8   Smokeless tobacco: Never  Substance Use Topics   Alcohol  use: No    Alcohol /week: 0.0 standard drinks of alcohol     FAMILY HISTORY   The patient's family history includes Anxiety disorder in her sister; Arthritis in her mother, sister, and sister; COPD in her mother; Depression in her sister; Diabetes in her brother, brother, mother, sister, and sister; Hypertension in her  mother; Mental illness in her brother; Obesity in her brother; Stroke in her mother.  PHYSICAL EXAM & DATA  Vitals:   11/22/24 0630 11/22/24 0700 11/22/24 0754 11/22/24 0840  BP: 139/62 (!) 138/55  (!) 142/60  Pulse: 92 91  98  Resp: 20 20    Temp:   99.4 F (37.4 C)   TempSrc:   Axillary   SpO2: 92% 97%    Weight:      Height:        Intake/Output Summary (Last 24 hours) at 11/22/2024 0957 Last data filed at 11/22/2024 0754 Gross per 24 hour  Intake 413.8 ml  Output 1100 ml  Net -686.2 ml   Filed Weights   11/14/24 2020 11/14/24 2356 11/21/24 0356  Weight: 107.5 kg 113.8 kg  103.4 kg   Body mass index is 40.38 kg/m.   Gen: Patient appears comfortable at rest. HEENT: Conjunctiva and lids normal. Neck: Supple, no elevated JVP. Lungs: Scattered rhonchi, no wheezing. Cardiac: Regular rate and rhythm, no S3, soft systolic murmur, no pericardial rub. Abdomen: Soft, nontender, bowel sounds present. Extremities: No pitting edema, distal pulses 2+. Skin: Warm and dry. Musculoskeletal: No kyphosis. Neuropsychiatric: Alert and oriented x3, affect grossly appropriate.  EKG:  An ECG dated 11/16/2024 was personally reviewed today and demonstrated:  Atrial fibrillation with RVR.  Telemetry:  I personally reviewed telemetry which shows sinus rhythm.  RELEVANT CV STUDIES  Echocardiogram 11/15/2024:  1. Limited visualization even with echocontrast, not able to position  patient on side while on bipap and BMI 44. Consider repeat study when off  bipap. LVEF is probably low normal. . Left ventricular endocardial border  not optimally defined to evaluate  regional wall motion. There is mild left ventricular hypertrophy.   2. Right ventricular systolic function was not well visualized. The right  ventricular size is not well visualized. Tricuspid regurgitation signal is  inadequate for assessing PA pressure.   3. The mitral valve was not well visualized.   4. The aortic valve was not well visualized.   LABORATORY DATA  Chemistry Recent Labs  Lab 11/20/24 0321 11/21/24 0341 11/22/24 0419  NA 140 142 141  K 4.0 3.6 3.8  CL 98 98 98  CO2 27 28 30   GLUCOSE 174* 164* 191*  BUN 41* 52* 49*  CREATININE 1.76* 1.82* 1.64*  CALCIUM  9.1 8.5* 8.6*  GFRNONAA 30* 29* 33*  ANIONGAP 14 16* 13    Recent Labs  Lab 11/17/24 0519 11/18/24 0245  ALBUMIN 3.2* 3.4*   Hematology Recent Labs  Lab 11/20/24 0321 11/21/24 0341 11/22/24 0419  WBC 10.2 9.4 10.1  RBC 2.94* 2.79* 2.95*  HGB 9.6* 9.3* 9.7*  HCT 28.7* 27.5* 28.9*  MCV 97.6 98.6 98.0  MCH 32.7 33.3 32.9  MCHC  33.4 33.8 33.6  RDW 16.6* 16.4* 16.8*  PLT 246 229 239   Cardiac Enzymes Recent Labs  Lab 11/14/24 2039 11/14/24 2242  TRNPT 25* 25*     Lipid Panel     Component Value Date/Time   CHOL 143 10/06/2024 1513   TRIG 104 10/06/2024 1513   HDL 72 10/06/2024 1513   CHOLHDL 2.0 10/06/2024 1513   CHOLHDL 5.0 06/13/2018 0246   VLDL 62 (H) 06/13/2018 0246   LDLCALC 52 10/06/2024 1513   LDLDIRECT 144 (H) 07/25/2016 1536   LABVLDL 19 10/06/2024 1513    RADIOLOGY/STUDIES  Chest x-ray 11/15/2024: FINDINGS:   LUNGS AND PLEURA: Patchy consolidation in the left greater than right lower lobes continues  to be seen. Other less dense airspace disease noted on CT in the right upper lobe is not radiographically visible.   The overall Aeration seems unchanged No pleural effusion. No pneumothorax.   HEART AND MEDIASTINUM: There is mild cardiomegaly. Aortic atherosclerosis with normal mediastinal configuration.   BONES AND SOFT TISSUES: Thoracic spondylosis.   IMPRESSION: 1. Persistent patchy consolidation in the left greater than right lower lobes. 2. Mild cardiomegaly.  ASSESSMENT & PLAN  1.  Paroxysmal atrial fibrillation as well as history of atrial tachycardia with CHA2DS2-VASc score of 7.  Had breakthrough episode with RVR over the weekend, now back in sinus rhythm by telemetry.  Currently on Eliquis  5 mg twice daily, Cardizem  CD 360 mg daily, Lopressor  50 mg twice daily, and IV amiodarone .  2.  HFpEF by history, LVEF low normal range by limited echocardiogram this admission.  Has had reasonable diuresis, currently on Demadex  40 mg daily and Aldactone  25 mg daily.  She was on Mounjaro  7.5 mg weekly as an outpatient as well.  3.  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  Creatinine down to 1.64 with GFR 33.  Would continue IV amiodarone  through today and then discontinue if rhythm remains stable.  Can remain on Eliquis  5 mg twice daily, Cardizem  CD 360 mg daily, and Lopressor  50 mg  twice daily otherwise.  Could always consider oral amiodarone  if paroxysmal arrhythmia remains an issue, but it does not sound like this has been problematic in talking with her today.  She should maintain follow-up with her primary cardiologist for further discussion.  We will follow while she is an inpatient.  For questions or updates, please contact [facility] Please consult [email or web address] for contact info under   Signed, Jayson Sierras, MD  11/22/2024 9:57 AM     [1]  Allergies Allergen Reactions   Codeine Rash   "

## 2024-11-22 NOTE — Plan of Care (Signed)
" °  Problem: Education: Goal: Knowledge of General Education information will improve Description: Including pain rating scale, medication(s)/side effects and non-pharmacologic comfort measures Outcome: Progressing   Problem: Health Behavior/Discharge Planning: Goal: Ability to manage health-related needs will improve Outcome: Progressing   Problem: Clinical Measurements: Goal: Ability to maintain clinical measurements within normal limits will improve Outcome: Progressing Goal: Will remain free from infection Outcome: Progressing Goal: Diagnostic test results will improve Outcome: Progressing Goal: Respiratory complications will improve Outcome: Progressing Goal: Cardiovascular complication will be avoided Outcome: Progressing   Problem: Activity: Goal: Risk for activity intolerance will decrease Outcome: Not Progressing   Problem: Nutrition: Goal: Adequate nutrition will be maintained Outcome: Not Progressing   Problem: Coping: Goal: Level of anxiety will decrease Outcome: Progressing   Problem: Elimination: Goal: Will not experience complications related to bowel motility Outcome: Progressing Goal: Will not experience complications related to urinary retention Outcome: Progressing   Problem: Pain Managment: Goal: General experience of comfort will improve and/or be controlled Outcome: Progressing   Problem: Safety: Goal: Ability to remain free from injury will improve Outcome: Progressing   Problem: Skin Integrity: Goal: Risk for impaired skin integrity will decrease Outcome: Progressing   Problem: Education: Goal: Ability to describe self-care measures that may prevent or decrease complications (Diabetes Survival Skills Education) will improve Outcome: Progressing Goal: Individualized Educational Video(s) Outcome: Progressing   Problem: Coping: Goal: Ability to adjust to condition or change in health will improve Outcome: Progressing   Problem: Fluid  Volume: Goal: Ability to maintain a balanced intake and output will improve Outcome: Progressing   Problem: Health Behavior/Discharge Planning: Goal: Ability to identify and utilize available resources and services will improve Outcome: Progressing Goal: Ability to manage health-related needs will improve Outcome: Progressing   Problem: Metabolic: Goal: Ability to maintain appropriate glucose levels will improve Outcome: Progressing   Problem: Nutritional: Goal: Maintenance of adequate nutrition will improve Outcome: Not Progressing Goal: Progress toward achieving an optimal weight will improve Outcome: Not Progressing   Problem: Skin Integrity: Goal: Risk for impaired skin integrity will decrease Outcome: Progressing   Problem: Tissue Perfusion: Goal: Adequacy of tissue perfusion will improve Outcome: Progressing   "

## 2024-11-22 NOTE — Progress Notes (Signed)
 " PROGRESS NOTE    Diana Mathews  FMW:[id number] DOB: 23-Apr-1951 DOA: 11/14/2024 PCP: Zollie Lowers, MD   Brief Narrative:     Diana Mathews is a 74 y.o. female with medical history significant for metastatic breast cancer with mets to the bone on letrozole , paroxysmal A-fib on Eliquis , type 2 diabetes, hypertension, hyperlipidemia, iron deficiency anemia, severe morbid obesity, CKD 4, recently admitted from 11/01/2024 through 11/03/2024 for AKI on CKD 4, presents to the ER due to sudden onset shortness of breath .Her son noticed that she was having increased work of breathing and called EMS.  Upon EMS arrival, the patient was saturating 82% on room air.  She was placed on nonrebreather and brought to the ER for further evaluation.   In the ER, tachycardic and tachypneic.  The patient was placed on BiPAP due to increased work of breathing.  Imaging revealed multifocal pneumonia within the bilateral lower lobes, lingula, right middle lobe and inferior right upper lobe, airspace consolidation is most significant in the left lower lobe. She was noted to have AKI on CKD and felt to be volume overloaded as well.  Was hyponatremic,hyperkalemic as well. This slowly improved with IV diuresis.  Patient now on p.o. torsemide  Completed treatment for pneumonia. Hospital course notable for recurrent A fib RVR, started on IV Cardizem  drip 1/23, patient remains in A-fib RVR despite IV Cardizem  drip, started on amiodarone  drip 1/25.  Assessment & Plan:   Principal Problem:   Acute hypoxic respiratory failure (HCC) Active Problems:   Essential hypertension   Mood disorder   Fibrillation, atrial (HCC)   Malignant neoplasm metastatic to bone (HCC)   Ductal carcinoma in situ (DCIS) of right breast   Diabetes mellitus treated with injections of non-insulin  medication (HCC)   Morbid obesity (HCC)   AKI (acute kidney injury)   Pressure injury of skin   Acute hypoxic respiratory failure secondary to  below Multifocal pneumonia Volume overload  not on oxygen supplementation at baseline. Reportedly with O2 saturation of 82% on room air, on admission,  continue supplemental oxygen through nasal cannula.  Down to 4 to 6 L/min.  Out of bed, PT, incentive spirometry Will continue BiPAP at night and as needed during daytime, will wean down as tolerated Completed IV ceftriaxone  and doxycycline  for 7 days for pneumonia.   Volume off with IV Lasix ---> switched to p.o. torsemide  1/23, appreciate nephrology input.   Elevated troponin in the setting of the above, suspect demand ischemia High sensitive troponin flat 25, 25 No evidence of acute ischemia on 12 lead EKG Monitor on telemetry Echocardiogram obtained with poor study, LV function likely low normal  Hyponatremia/hyperkalemia/AKI on CKD stage III  Presented with serum sodium of 117 and glucose of 354.  Corrected sodium 121, potassium 5.6. They have improved with diuresis.  Sodium 141, potassium 3.8, creatinine 1.64 on 1/26. Nephrology has signed off  Type 2 diabetes with hyperglycemia Presented with serum glucose of 354. Hemoglobin A1c 8.3 on 11/01/2024. Insulin  coverage in place.   Paroxysmal A-fib on Eliquis /rapid ventricular response - Patient remains in A-fib RVR despite home oral Cardizem  240 mg daily, metoprolol  50 twice daily.  Resume increased to 360 mg daily. - Started on Cardizem  drip 1/23, but rate remained uncontrolled and subsequently started on Amiodarone  bolus and maintenance drip 1/25.  No control, rhythm irregular.  Cardiology consulted today. Continue Eliquis    Anemia of chronic disease Hemoglobin 9.4 No reported overt bleeding Continue to monitor H&H.   Obesity BMI 44  Recommend weight loss outpatient with regular physical activity and healthy dieting.   Metastatic breast cancer with bone metastasis Currently on letrozole , prior to admission Close follow-up appointment with oncology.    Physical  deconditioning: Needs SNF  Continue home medications including BuSpar , Abilify .   DVT prophylaxis: On Eliquis .   Code Status: Full code.   Family Communication:    Disposition Plan: SNF, anticipate discharge in next 2 days. Admission status: Inpatient status.     Status is: Inpatient The patient requires at least 2 midnight for further evaluation and treatment of present condition.    Subjective:  Patient seen and examined at the bedside.  Remains in A-fib, rate controlled, continues to be on IV amiodarone .  Patient denies chest pain and shortness of breath.  Vital signs stable.  Still hypoxic needing oxygen 4 L/min.  objective: Vitals:   11/22/24 1100 11/22/24 1145 11/22/24 1200 11/22/24 1300  BP: (!) 156/72  (!) 152/67 136/62  Pulse: 89  87 84  Resp: (!) 31  (!) 25 20  Temp:  98.5 F (36.9 C)    TempSrc:  Oral    SpO2: 92%  95% 96%  Weight:      Height:        Intake/Output Summary (Last 24 hours) at 11/22/2024 1431 Last data filed at 11/22/2024 1100 Gross per 24 hour  Intake 413.8 ml  Output 1050 ml  Net -636.2 ml   Filed Weights   11/14/24 2020 11/14/24 2356 11/21/24 0356  Weight: 107.5 kg 113.8 kg 103.4 kg    Examination:  General:alert, oriented, not in any acute distress  chest: Bilateral crackles, no wheezing  CVS S1, S2, no murmur, A-fib RVR Abdomen: Soft, nontender Extremities: Bilateral lower extremity edema improving   Data Reviewed: I have personally reviewed following labs and imaging studies  CBC: Recent Labs  Lab 11/16/24 0418 11/17/24 0513 11/20/24 0321 11/21/24 0341 11/22/24 0419  WBC 2.7* 7.6 10.2 9.4 10.1  NEUTROABS 1.9 5.9 7.0 6.7 7.5  HGB 9.7* 9.3* 9.6* 9.3* 9.7*  HCT 28.2* 26.6* 28.7* 27.5* 28.9*  MCV 96.2 96.0 97.6 98.6 98.0  PLT 172 224 246 229 239   Basic Metabolic Panel: Recent Labs  Lab 11/17/24 0513 11/17/24 0519 11/18/24 0245 11/19/24 0327 11/20/24 0321 11/21/24 0341 11/22/24 0419  NA 131* 131* 135 139 140  142 141  K 4.9 4.9 4.3 3.6 4.0 3.6 3.8  CL 95* 95* 97* 101 98 98 98  CO2 20* 20* 24 27 27 28 30   GLUCOSE 199* 196* 171* 224* 174* 164* 191*  BUN 34* 34* 38* 35* 41* 52* 49*  CREATININE 2.29* 2.24* 2.13* 1.60* 1.76* 1.82* 1.64*  CALCIUM  8.3* 8.3* 8.9 8.9 9.1 8.5* 8.6*  MG 1.7  --   --  1.3* 2.0 1.8 1.8  PHOS  --  3.3 2.7  --   --   --   --    GFR: Estimated Creatinine Clearance: 36.2 mL/min (A) (by C-G formula based on SCr of 1.64 mg/dL (H)). Liver Function Tests: Recent Labs  Lab 11/17/24 0519 11/18/24 0245  ALBUMIN 3.2* 3.4*   No results for input(s): LIPASE, AMYLASE in the last 168 hours. No results for input(s): AMMONIA in the last 168 hours. Coagulation Profile: No results for input(s): INR, PROTIME in the last 168 hours. Cardiac Enzymes: No results for input(s): CKTOTAL, CKMB, CKMBINDEX, TROPONINI in the last 168 hours. BNP (last 3 results) Recent Labs    11/14/24 2039  PROBNP 814.0*   HbA1C:  No results for input(s): HGBA1C in the last 72 hours. CBG: Recent Labs  Lab 11/22/24 0054 11/22/24 0423 11/22/24 0546 11/22/24 0743 11/22/24 1143  GLUCAP 170* 186* 204* 160* 284*   Lipid Profile: No results for input(s): CHOL, HDL, LDLCALC, TRIG, CHOLHDL, LDLDIRECT in the last 72 hours. Thyroid  Function Tests: No results for input(s): TSH, T4TOTAL, FREET4, T3FREE, THYROIDAB in the last 72 hours. Anemia Panel: No results for input(s): VITAMINB12, FOLATE, FERRITIN, TIBC, IRON, RETICCTPCT in the last 72 hours. Sepsis Labs: No results for input(s): PROCALCITON, LATICACIDVEN in the last 168 hours.  Recent Results (from the past 240 hours)  Resp panel by RT-PCR (RSV, Flu A&B, Covid) Anterior Nasal Swab     Status: None   Collection Time: 11/14/24  8:39 PM   Specimen: Anterior Nasal Swab  Result Value Ref Range Status   SARS Coronavirus 2 by RT PCR NEGATIVE NEGATIVE Final    Comment: (NOTE) SARS-CoV-2 target  nucleic acids are NOT DETECTED.  The SARS-CoV-2 RNA is generally detectable in upper respiratory specimens during the acute phase of infection. The lowest concentration of SARS-CoV-2 viral copies this assay can detect is 138 copies/mL. A negative result does not preclude SARS-Cov-2 infection and should not be used as the sole basis for treatment or other patient management decisions. A negative result may occur with  improper specimen collection/handling, submission of specimen other than nasopharyngeal swab, presence of viral mutation(s) within the areas targeted by this assay, and inadequate number of viral copies(<138 copies/mL). A negative result must be combined with clinical observations, patient history, and epidemiological information. The expected result is Negative.  Fact Sheet for Patients:  bloggercourse.com  Fact Sheet for Healthcare Providers:  seriousbroker.it  This test is no t yet approved or cleared by the [place]  FDA and  has been authorized for detection and/or diagnosis of SARS-CoV-2 by FDA under an Emergency Use Authorization (EUA). This EUA will remain  in effect (meaning this test can be used) for the duration of the COVID-19 declaration under Section 564(b)(1) of the Act, 21 U.S.C.section 360bbb-3(b)(1), unless the authorization is terminated  or revoked sooner.       Influenza A by PCR NEGATIVE NEGATIVE Final   Influenza B by PCR NEGATIVE NEGATIVE Final    Comment: (NOTE) The Xpert Xpress SARS-CoV-2/FLU/RSV plus assay is intended as an aid in the diagnosis of influenza from Nasopharyngeal swab specimens and should not be used as a sole basis for treatment. Nasal washings and aspirates are unacceptable for Xpert Xpress SARS-CoV-2/FLU/RSV testing.  Fact Sheet for Patients: bloggercourse.com  Fact Sheet for Healthcare  Providers: seriousbroker.it  This test is not yet approved or cleared by the [place]  FDA and has been authorized for detection and/or diagnosis of SARS-CoV-2 by FDA under an Emergency Use Authorization (EUA). This EUA will remain in effect (meaning this test can be used) for the duration of the COVID-19 declaration under Section 564(b)(1) of the Act, 21 U.S.C. section 360bbb-3(b)(1), unless the authorization is terminated or revoked.     Resp Syncytial Virus by PCR NEGATIVE NEGATIVE Final    Comment: (NOTE) Fact Sheet for Patients: bloggercourse.com  Fact Sheet for Healthcare Providers: seriousbroker.it  This test is not yet approved or cleared by the [place]  FDA and has been authorized for detection and/or diagnosis of SARS-CoV-2 by FDA under an Emergency Use Authorization (EUA). This EUA will remain in effect (meaning this test can be used) for the duration of the COVID-19 declaration under Section 564(b)(1) of the  Act, 21 U.S.C. section 360bbb-3(b)(1), unless the authorization is terminated or revoked.  Performed at [facility], [address], [place], [place] [address]   MRSA Next Gen by PCR, Nasal     Status: Abnormal   Collection Time: 11/15/24 12:06 AM   Specimen: Nasal Mucosa; Nasal Swab  Result Value Ref Range Status   MRSA by PCR Next Gen DETECTED (A) NOT DETECTED Final    Comment: RESULT CALLED TO, READ BACK BY AND VERIFIED WITH: N RONE,RN@[id number]  11/15/24 MK (NOTE) The GeneXpert MRSA Assay (FDA approved for NASAL specimens only), is one component of a comprehensive MRSA colonization surveillance program. It is not intended to diagnose MRSA infection nor to guide or monitor treatment for MRSA infections. Test performance is not FDA approved in patients less than 68 years old. Performed at [facility], [address], [place], [place] [address]          Radiology  Studies: No results found.       Scheduled Meds:  apixaban   5 mg Oral BID   ARIPiprazole   5 mg Oral Daily   atorvastatin   40 mg Oral Daily   busPIRone   10 mg Oral TID   Chlorhexidine  Gluconate Cloth  6 each Topical Daily   diltiazem   360 mg Oral Daily   DULoxetine   60 mg Oral Daily   feeding supplement  237 mL Oral BID BM   Gerhardt's butt cream   Topical BID   insulin  aspart  0-20 Units Subcutaneous Q4H   ipratropium-albuterol   3 mL Nebulization TID   letrozole   2.5 mg Oral Daily   magnesium  oxide  200 mg Oral Daily   metoprolol  tartrate  50 mg Oral BID   mouth rinse  15 mL Mouth Rinse 4 times per day   rOPINIRole   1 mg Oral QHS   spironolactone   25 mg Oral Daily   torsemide   40 mg Oral Daily   Continuous Infusions:  amiodarone  30 mg/hr (11/22/24 0754)          Derryl Duval, MD [facility] 11/22/2024, 2:31 PM   "

## 2024-11-22 NOTE — Plan of Care (Signed)

## 2024-11-23 DIAGNOSIS — I4811 Longstanding persistent atrial fibrillation: Secondary | ICD-10-CM | POA: Diagnosis not present

## 2024-11-23 DIAGNOSIS — I10 Essential (primary) hypertension: Secondary | ICD-10-CM

## 2024-11-23 DIAGNOSIS — J9601 Acute respiratory failure with hypoxia: Secondary | ICD-10-CM | POA: Diagnosis not present

## 2024-11-23 DIAGNOSIS — N179 Acute kidney failure, unspecified: Secondary | ICD-10-CM | POA: Diagnosis not present

## 2024-11-23 DIAGNOSIS — I4891 Unspecified atrial fibrillation: Secondary | ICD-10-CM

## 2024-11-23 LAB — BASIC METABOLIC PANEL WITH GFR
Anion gap: 16 — ABNORMAL HIGH (ref 5–15)
BUN: 44 mg/dL — ABNORMAL HIGH (ref 8–23)
CO2: 31 mmol/L (ref 22–32)
Calcium: 8.8 mg/dL — ABNORMAL LOW (ref 8.9–10.3)
Chloride: 97 mmol/L — ABNORMAL LOW (ref 98–111)
Creatinine, Ser: 1.48 mg/dL — ABNORMAL HIGH (ref 0.44–1.00)
GFR, Estimated: 37 mL/min — ABNORMAL LOW
Glucose, Bld: 169 mg/dL — ABNORMAL HIGH (ref 70–99)
Potassium: 3.6 mmol/L (ref 3.5–5.1)
Sodium: 143 mmol/L (ref 135–145)

## 2024-11-23 LAB — CBC WITH DIFFERENTIAL/PLATELET
Abs Immature Granulocytes: 0.14 10*3/uL — ABNORMAL HIGH (ref 0.00–0.07)
Basophils Absolute: 0.1 10*3/uL (ref 0.0–0.1)
Basophils Relative: 1 %
Eosinophils Absolute: 0.3 10*3/uL (ref 0.0–0.5)
Eosinophils Relative: 3 %
HCT: 31.2 % — ABNORMAL LOW (ref 36.0–46.0)
Hemoglobin: 10.4 g/dL — ABNORMAL LOW (ref 12.0–15.0)
Immature Granulocytes: 1 %
Lymphocytes Relative: 13 %
Lymphs Abs: 1.4 10*3/uL (ref 0.7–4.0)
MCH: 33.2 pg (ref 26.0–34.0)
MCHC: 33.3 g/dL (ref 30.0–36.0)
MCV: 99.7 fL (ref 80.0–100.0)
Monocytes Absolute: 0.7 10*3/uL (ref 0.1–1.0)
Monocytes Relative: 7 %
Neutro Abs: 7.5 10*3/uL (ref 1.7–7.7)
Neutrophils Relative %: 75 %
Platelets: 296 10*3/uL (ref 150–400)
RBC: 3.13 MIL/uL — ABNORMAL LOW (ref 3.87–5.11)
RDW: 17 % — ABNORMAL HIGH (ref 11.5–15.5)
WBC: 10.1 10*3/uL (ref 4.0–10.5)
nRBC: 0.3 % — ABNORMAL HIGH (ref 0.0–0.2)

## 2024-11-23 LAB — GLUCOSE, CAPILLARY
Glucose-Capillary: 165 mg/dL — ABNORMAL HIGH (ref 70–99)
Glucose-Capillary: 172 mg/dL — ABNORMAL HIGH (ref 70–99)
Glucose-Capillary: 300 mg/dL — ABNORMAL HIGH (ref 70–99)
Glucose-Capillary: 283 mg/dL — ABNORMAL HIGH (ref 70–99)
Glucose-Capillary: 183 mg/dL — ABNORMAL HIGH (ref 70–99)

## 2024-11-23 LAB — MAGNESIUM: Magnesium: 1.7 mg/dL (ref 1.7–2.4)

## 2024-11-23 MED ORDER — INSULIN ASPART 100 UNIT/ML IJ SOLN
0.0000 [IU] | Freq: Three times a day (TID) | INTRAMUSCULAR | Status: DC
  Administered 2024-11-23 (×2): 11 [IU] via SUBCUTANEOUS
  Administered 2024-11-24: 4 [IU] via SUBCUTANEOUS
  Administered 2024-11-24: 7 [IU] via SUBCUTANEOUS
  Administered 2024-11-24: 11 [IU] via SUBCUTANEOUS
  Administered 2024-11-25: 15 [IU] via SUBCUTANEOUS
  Administered 2024-11-25: 4 [IU] via SUBCUTANEOUS
  Filled 2024-11-23 (×8): qty 1

## 2024-11-23 MED ORDER — INSULIN ASPART 100 UNIT/ML IJ SOLN
4.0000 [IU] | Freq: Three times a day (TID) | INTRAMUSCULAR | Status: DC
  Administered 2024-11-23 – 2024-11-24 (×2): 4 [IU] via SUBCUTANEOUS
  Filled 2024-11-23 (×2): qty 1

## 2024-11-23 MED ORDER — METOPROLOL TARTRATE 50 MG PO TABS
50.0000 mg | ORAL_TABLET | Freq: Three times a day (TID) | ORAL | Status: DC
  Administered 2024-11-23 – 2024-11-25 (×7): 50 mg via ORAL
  Filled 2024-11-23 (×7): qty 1

## 2024-11-23 MED ORDER — INSULIN GLARGINE-YFGN 100 UNIT/ML ~~LOC~~ SOLN
12.0000 [IU] | Freq: Every day | SUBCUTANEOUS | Status: DC
  Administered 2024-11-23: 12 [IU] via SUBCUTANEOUS
  Filled 2024-11-23 (×3): qty 0.12

## 2024-11-23 MED ORDER — GABAPENTIN 300 MG PO CAPS: 300.0000 mg | ORAL_CAPSULE | Freq: Three times a day (TID) | ORAL | Status: DC | PRN

## 2024-11-23 MED ORDER — BACLOFEN 10 MG PO TABS: 10.0000 mg | ORAL_TABLET | Freq: Three times a day (TID) | ORAL | Status: DC | PRN

## 2024-11-23 NOTE — Progress Notes (Signed)
 "  Rounding Note   Patient Name: Diana Mathews Date of Encounter: 11/23/2024  [facility] Cardiologist: Siyuan Peter Sheng, MD   Subjective Some ongoing SOB  Scheduled Meds:  apixaban   5 mg Oral BID   ARIPiprazole   5 mg Oral Daily   atorvastatin   40 mg Oral Daily   busPIRone   10 mg Oral TID   Chlorhexidine  Gluconate Cloth  6 each Topical Daily   diltiazem   360 mg Oral Daily   DULoxetine   60 mg Oral Daily   feeding supplement  237 mL Oral BID BM   Gerhardt's butt cream   Topical BID   insulin  aspart  0-20 Units Subcutaneous Q4H   ipratropium-albuterol   3 mL Nebulization TID   letrozole   2.5 mg Oral Daily   magnesium  oxide  200 mg Oral Daily   metoprolol  tartrate  50 mg Oral BID   mouth rinse  15 mL Mouth Rinse 4 times per day   rOPINIRole   1 mg Oral QHS   spironolactone   25 mg Oral Daily   torsemide   40 mg Oral Daily   Continuous Infusions:  amiodarone  30 mg/hr (11/23/24 0945)   PRN Meds: acetaminophen , guaiFENesin -dextromethorphan , ipratropium-albuterol , melatonin, mouth rinse, phenol, polyethylene glycol, prochlorperazine , traZODone    Vital Signs  Vitals:   11/23/24 0830 11/23/24 0848 11/23/24 0900 11/23/24 0930  BP: (!) 152/73  (!) 143/81 (!) 154/67  Pulse: 100  (!) 106 92  Resp: 14  (!) 26 14  Temp:      TempSrc:      SpO2: 98% 95% 96% 94%  Weight:      Height:        Intake/Output Summary (Last 24 hours) at 11/23/2024 0954 Last data filed at 11/23/2024 0945 Gross per 24 hour  Intake 550.42 ml  Output 1500 ml  Net -949.58 ml      11/21/2024    3:56 AM 11/14/2024   11:56 PM 11/14/2024    8:20 PM  Last 3 Weights  Weight (lbs) 227 lb 15.3 oz 250 lb 14.1 oz 236 lb 15.9 oz  Weight (kg) 103.4 kg 113.8 kg 107.5 kg      Telemetry SR - Personally Reviewed  ECG  N/a - Personally Reviewed  Physical Exam  GEN: No acute distress.   Neck: No JVD Cardiac: RRR, no murmurs, rubs, or gallops.  Respiratory: Clear to auscultation bilaterally. GI:  Soft, nontender, non-distended  MS: No edema; No deformity. Neuro:  Nonfocal  Psych: Normal affect   Labs High Sensitivity Troponin:  No results for input(s): TROPONINIHS in the last 720 hours.  Recent Labs  Lab 11/14/24 2039 11/14/24 2242  TRNPT 25* 25*       Chemistry Recent Labs  Lab 11/17/24 0519 11/18/24 0245 11/19/24 0327 11/21/24 0341 11/22/24 0419 11/23/24 0439  NA 131* 135   < > 142 141 143  K 4.9 4.3   < > 3.6 3.8 3.6  CL 95* 97*   < > 98 98 97*  CO2 20* 24   < > 28 30 31   GLUCOSE 196* 171*   < > 164* 191* 169*  BUN 34* 38*   < > 52* 49* 44*  CREATININE 2.24* 2.13*   < > 1.82* 1.64* 1.48*  CALCIUM  8.3* 8.9   < > 8.5* 8.6* 8.8*  MG  --   --    < > 1.8 1.8 1.7  ALBUMIN 3.2* 3.4*  --   --   --   --  GFRNONAA 23* 24*   < > 29* 33* 37*  ANIONGAP 16* 14   < > 16* 13 16*   < > = values in this interval not displayed.    Lipids No results for input(s): CHOL, TRIG, HDL, LABVLDL, LDLCALC, CHOLHDL in the last 168 hours.  Hematology Recent Labs  Lab 11/21/24 0341 11/22/24 0419 11/23/24 0439  WBC 9.4 10.1 10.1  RBC 2.79* 2.95* 3.13*  HGB 9.3* 9.7* 10.4*  HCT 27.5* 28.9* 31.2*  MCV 98.6 98.0 99.7  MCH 33.3 32.9 33.2  MCHC 33.8 33.6 33.3  RDW 16.4* 16.8* 17.0*  PLT 229 239 296   Thyroid  No results for input(s): TSH, FREET4 in the last 168 hours.  BNPNo results for input(s): BNP, PROBNP in the last 168 hours.  DDimer No results for input(s): DDIMER in the last 168 hours.   Radiology  No results found.    Assessment & Plan  1.PAF/atach - issues with tachycarrhythmias this admission in setting of hypoxia/pneumonia - started on IV amiodarone , also on dilt 360 and lopressor  50mg  bid - she is on eliquis  for stroke prevention.   - stable in SR, we will d/c IV amidoarone. At this time would not plan for committement to oral amio as her tachyarrhythmia was driven by severe systemic stress. If recurrent arrhythmias as inpatient would  reconsider oral amio.     2. Chronic HFpEF - Jan 2026 echo: Limited visualization with patient on bipap, LVEF is probably low normal. . Left ventricular endocardial border  not optimally defined to evaluate  - CXR bilateral patchy consolidation, proBNP 814 - per charting negative 10 L this admission, yesterday - currently on torsemide  40mg  daily, prior AKI is resolving.    3.Metastatic breast cancer with bone mets -   No additional recs at this time, we will sign off inpatient care.   For questions or updates, please contact [facility] Please consult [email or web address] for contact info under       Signed, Alvan Carrier, MD  11/23/2024, 9:54 AM    "

## 2024-11-23 NOTE — Progress Notes (Signed)
 " PROGRESS NOTE   Diana Mathews  FMW:[id number] DOB: [birth year] DOA: 11/14/2024 PCP: Zollie Lowers, MD   Chief Complaint  Patient presents with   Shortness of Breath   Level of care: Telemetry  Brief Admission History:  75 y.o. female with medical history significant for metastatic breast cancer with mets to the bone on letrozole , paroxysmal A-fib on Eliquis , type 2 diabetes, hypertension, hyperlipidemia, iron deficiency anemia, severe morbid obesity, CKD 4, recently admitted from 11/01/2024 through 11/03/2024 for AKI on CKD 4, presents to the ER due to sudden onset shortness of breath .Her son noticed that she was having increased work of breathing and called EMS.  Upon EMS arrival, the patient was saturating 82% on room air.  She was placed on nonrebreather and brought to the ER for further evaluation.   In the ER, tachycardic and tachypneic.  The patient was placed on BiPAP due to increased work of breathing.  Imaging revealed multifocal pneumonia within the bilateral lower lobes, lingula, right middle lobe and inferior right upper lobe, airspace consolidation is most significant in the left lower lobe.  She was noted to have AKI on CKD and felt to be volume overloaded as well.  Was hyponatremic,hyperkalemic as well.  This slowly improved with IV diuresis.  Patient now on p.o. torsemide .  Completed treatment for pneumonia.  Hospital course notable for recurrent A fib RVR, started on IV Cardizem  drip 1/23, patient remains in A-fib RVR despite IV Cardizem  drip, started on amiodarone  drip 1/25.   Assessment and Plan: Acute hypoxic respiratory failure secondary to below Multifocal pneumonia Volume overload  not on oxygen supplementation at baseline. Reportedly with O2 saturation of 82% on room air, on admission,  continue supplemental oxygen through nasal cannula.  Down to 4 to 6 L/min.  Out of bed, PT, incentive spirometry Will continue BiPAP at night and as needed during daytime, will wean  down as tolerated Completed IV ceftriaxone  and doxycycline  for 7 days for pneumonia.   Volume off with IV Lasix ---> switched to p.o. torsemide  1/23, appreciate nephrology input.   Elevated troponin in the setting of the above, suspect demand ischemia High sensitive troponin flat 25, 25 No evidence of acute ischemia on 12 lead EKG Monitor on telemetry Echocardiogram obtained with poor study, LV function likely low normal   Hyponatremia/hyperkalemia/AKI on CKD stage III  Presented with serum sodium of 117 and glucose of 354.  Corrected sodium 121, potassium 5.6. They have improved with diuresis.  Sodium 141, potassium 3.8, creatinine 1.64 on 1/26. Nephrology has signed off   Type 2 diabetes with hyperglycemia Presented with serum glucose of 354. Hemoglobin A1c 8.3 on 11/01/2024. Insulin  coverage in place.   Paroxysmal A-fib on Eliquis /rapid ventricular response - Patient remains in A-fib RVR despite home oral Cardizem  240 mg daily, metoprolol  50 twice daily.  Resume increased to 360 mg daily. - Started on Cardizem  drip 1/23, but rate remained uncontrolled and subsequently started on Amiodarone  bolus and maintenance drip   No Cardiology consulted. Continue Eliquis  Pt now is off amiodarone .  Follow rates.     Anemia of chronic disease Hemoglobin 9.4 No reported overt bleeding Continue to monitor H&H.   Obesity BMI 44 Recommend weight loss outpatient with regular physical activity and healthy dieting.   Metastatic breast cancer with bone metastasis Currently on letrozole , prior to admission Close follow-up appointment with oncology.    Physical deconditioning: Needs SNF   Continue home medications including BuSpar , Abilify .   DVT prophylaxis: apixaban   Code  Status: DNR  Family Communication:  Disposition:   Consultants:   Procedures:   Antimicrobials:    Subjective: Pt reports no specific complaints, no palpitations, no CP or SOB.    Objective: Vitals:   11/23/24  1111 11/23/24 1200 11/23/24 1300 11/23/24 1311  BP:    (!) 152/69  Pulse:  95 99 96  Resp:  (!) 21 (!) 24   Temp:      TempSrc:      SpO2: 92% 93% 93% 95%  Weight:      Height:        Intake/Output Summary (Last 24 hours) at 11/23/2024 1404 Last data filed at 11/23/2024 1016 Gross per 24 hour  Intake 798.28 ml  Output 700 ml  Net 98.28 ml   Filed Weights   11/14/24 2020 11/14/24 2356 11/21/24 0356  Weight: 107.5 kg 113.8 kg 103.4 kg   Examination:  General exam: Appears calm and comfortable  Respiratory system: Clear to auscultation. Respiratory effort normal. Cardiovascular system: normal S1 & S2 heard. No JVD, murmurs, rubs, gallops or clicks. No pedal edema. Gastrointestinal system: Abdomen is nondistended, soft and nontender. No organomegaly or masses felt. Normal bowel sounds heard. Central nervous system: Alert and oriented. No focal neurological deficits. Extremities: Symmetric 5 x 5 power. Skin: No rashes, lesions or ulcers. Psychiatry: Judgement and insight appear normal. Mood & affect appropriate.   Data Reviewed: I have personally reviewed following labs and imaging studies  CBC: Recent Labs  Lab 11/17/24 0513 11/20/24 0321 11/21/24 0341 11/22/24 0419 11/23/24 0439  WBC 7.6 10.2 9.4 10.1 10.1  NEUTROABS 5.9 7.0 6.7 7.5 7.5  HGB 9.3* 9.6* 9.3* 9.7* 10.4*  HCT 26.6* 28.7* 27.5* 28.9* 31.2*  MCV 96.0 97.6 98.6 98.0 99.7  PLT 224 246 229 239 296    Basic Metabolic Panel: Recent Labs  Lab 11/17/24 0519 11/18/24 0245 11/19/24 0327 11/20/24 0321 11/21/24 0341 11/22/24 0419 11/23/24 0439  NA 131* 135 139 140 142 141 143  K 4.9 4.3 3.6 4.0 3.6 3.8 3.6  CL 95* 97* 101 98 98 98 97*  CO2 20* 24 27 27 28 30 31   GLUCOSE 196* 171* 224* 174* 164* 191* 169*  BUN 34* 38* 35* 41* 52* 49* 44*  CREATININE 2.24* 2.13* 1.60* 1.76* 1.82* 1.64* 1.48*  CALCIUM  8.3* 8.9 8.9 9.1 8.5* 8.6* 8.8*  MG  --   --  1.3* 2.0 1.8 1.8 1.7  PHOS 3.3 2.7  --   --   --   --   --      CBG: Recent Labs  Lab 11/22/24 1924 11/22/24 2349 11/23/24 0446 11/23/24 0749 11/23/24 1115  GLUCAP 232* 162* 165* 172* 300*    Recent Results (from the past 240 hours)  Resp panel by RT-PCR (RSV, Flu A&B, Covid) Anterior Nasal Swab     Status: None   Collection Time: 11/14/24  8:39 PM   Specimen: Anterior Nasal Swab  Result Value Ref Range Status   SARS Coronavirus 2 by RT PCR NEGATIVE NEGATIVE Final    Comment: (NOTE) SARS-CoV-2 target nucleic acids are NOT DETECTED.  The SARS-CoV-2 RNA is generally detectable in upper respiratory specimens during the acute phase of infection. The lowest concentration of SARS-CoV-2 viral copies this assay can detect is 138 copies/mL. A negative result does not preclude SARS-Cov-2 infection and should not be used as the sole basis for treatment or other patient management decisions. A negative result may occur with  improper specimen collection/handling, submission of specimen  other than nasopharyngeal swab, presence of viral mutation(s) within the areas targeted by this assay, and inadequate number of viral copies(<138 copies/mL). A negative result must be combined with clinical observations, patient history, and epidemiological information. The expected result is Negative.  Fact Sheet for Patients:  bloggercourse.com  Fact Sheet for Healthcare Providers:  seriousbroker.it  This test is no t yet approved or cleared by the [place]  FDA and  has been authorized for detection and/or diagnosis of SARS-CoV-2 by FDA under an Emergency Use Authorization (EUA). This EUA will remain  in effect (meaning this test can be used) for the duration of the COVID-19 declaration under Section 564(b)(1) of the Act, 21 U.S.C.section 360bbb-3(b)(1), unless the authorization is terminated  or revoked sooner.       Influenza A by PCR NEGATIVE NEGATIVE Final   Influenza B by PCR NEGATIVE NEGATIVE  Final    Comment: (NOTE) The Xpert Xpress SARS-CoV-2/FLU/RSV plus assay is intended as an aid in the diagnosis of influenza from Nasopharyngeal swab specimens and should not be used as a sole basis for treatment. Nasal washings and aspirates are unacceptable for Xpert Xpress SARS-CoV-2/FLU/RSV testing.  Fact Sheet for Patients: bloggercourse.com  Fact Sheet for Healthcare Providers: seriousbroker.it  This test is not yet approved or cleared by the [place]  FDA and has been authorized for detection and/or diagnosis of SARS-CoV-2 by FDA under an Emergency Use Authorization (EUA). This EUA will remain in effect (meaning this test can be used) for the duration of the COVID-19 declaration under Section 564(b)(1) of the Act, 21 U.S.C. section 360bbb-3(b)(1), unless the authorization is terminated or revoked.     Resp Syncytial Virus by PCR NEGATIVE NEGATIVE Final    Comment: (NOTE) Fact Sheet for Patients: bloggercourse.com  Fact Sheet for Healthcare Providers: seriousbroker.it  This test is not yet approved or cleared by the [place]  FDA and has been authorized for detection and/or diagnosis of SARS-CoV-2 by FDA under an Emergency Use Authorization (EUA). This EUA will remain in effect (meaning this test can be used) for the duration of the COVID-19 declaration under Section 564(b)(1) of the Act, 21 U.S.C. section 360bbb-3(b)(1), unless the authorization is terminated or revoked.  Performed at [facility], [address] [place], [place] [address]   MRSA Next Gen by PCR, Nasal     Status: Abnormal   Collection Time: 11/15/24 12:06 AM   Specimen: Nasal Mucosa; Nasal Swab  Result Value Ref Range Status   MRSA by PCR Next Gen DETECTED (A) NOT DETECTED Final    Comment: RESULT CALLED TO, READ BACK BY AND VERIFIED WITH: N RONE,RN@0338  11/15/24 MK (NOTE) The GeneXpert  MRSA Assay (FDA approved for NASAL specimens only), is one component of a comprehensive MRSA colonization surveillance program. It is not intended to diagnose MRSA infection nor to guide or monitor treatment for MRSA infections. Test performance is not FDA approved in patients less than 49 years old. Performed at [facility], [address], [place], [place] [address]      Radiology Studies: No results found.  Scheduled Meds:  apixaban   5 mg Oral BID   ARIPiprazole   5 mg Oral Daily   atorvastatin   40 mg Oral Daily   busPIRone   10 mg Oral TID   Chlorhexidine  Gluconate Cloth  6 each Topical Daily   diltiazem   360 mg Oral Daily   DULoxetine   60 mg Oral Daily   feeding supplement  237 mL Oral BID BM   Gerhardt's butt cream  Topical BID   insulin  aspart  0-20 Units Subcutaneous TID WC   insulin  aspart  4 Units Subcutaneous TID WC   insulin  glargine-yfgn  12 Units Subcutaneous Daily   ipratropium-albuterol   3 mL Nebulization TID   letrozole   2.5 mg Oral Daily   magnesium  oxide  200 mg Oral Daily   metoprolol  tartrate  50 mg Oral BID   mouth rinse  15 mL Mouth Rinse 4 times per day   rOPINIRole   1 mg Oral QHS   spironolactone   25 mg Oral Daily   torsemide   40 mg Oral Daily   Continuous Infusions:   LOS: 9 days   Time spent: 55 mins  Calandra Madura Vicci, MD How to contact the [facility] Attending or Consulting provider 7A - 7P or covering provider during after hours 7P -7A, for this patient?  Check the care team in [facility] and look for a) attending/consulting TRH provider listed and b) the TRH team listed Log into [email or web address] to find provider on call.  Locate the TRH provider you are looking for under [facility] and page to a number that you can be directly reached. If you still have difficulty reaching the provider, please page the [facility] (Director on Call) for the Hospitalists listed on amion for assistance.  11/23/2024, 2:04 PM    "

## 2024-11-23 NOTE — Progress Notes (Signed)
Bipap order is PRN; not needed at this time.

## 2024-11-23 NOTE — Plan of Care (Signed)
" °  Problem: Education: Goal: Knowledge of General Education information will improve Description: Including pain rating scale, medication(s)/side effects and non-pharmacologic comfort measures Outcome: Progressing   Problem: Clinical Measurements: Goal: Ability to maintain clinical measurements within normal limits will improve Outcome: Progressing Goal: Will remain free from infection Outcome: Progressing Goal: Diagnostic test results will improve Outcome: Progressing Goal: Respiratory complications will improve Outcome: Progressing Goal: Cardiovascular complication will be avoided Outcome: Progressing   Problem: Nutrition: Goal: Adequate nutrition will be maintained Outcome: Progressing   Problem: Elimination: Goal: Will not experience complications related to bowel motility Outcome: Progressing Goal: Will not experience complications related to urinary retention Outcome: Progressing   Problem: Coping: Goal: Level of anxiety will decrease Outcome: Progressing   Problem: Pain Managment: Goal: General experience of comfort will improve and/or be controlled Outcome: Progressing   Problem: Safety: Goal: Ability to remain free from injury will improve Outcome: Progressing   Problem: Skin Integrity: Goal: Risk for impaired skin integrity will decrease Outcome: Progressing   Problem: Skin Integrity: Goal: Risk for impaired skin integrity will decrease Outcome: Progressing   "

## 2024-11-23 NOTE — Plan of Care (Signed)

## 2024-11-23 NOTE — Hospital Course (Signed)
 74 y.o. female with medical history significant for metastatic breast cancer with mets to the bone on letrozole , paroxysmal A-fib on Eliquis , type 2 diabetes, hypertension, hyperlipidemia, iron deficiency anemia, severe morbid obesity, CKD 4, recently admitted from 11/01/2024 through 11/03/2024 for AKI on CKD 4, presents to the ER due to sudden onset shortness of breath .Her son noticed that she was having increased work of breathing and called EMS.  Upon EMS arrival, the patient was saturating 82% on room air.  She was placed on nonrebreather and brought to the ER for further evaluation.   In the ER, tachycardic and tachypneic.  The patient was placed on BiPAP due to increased work of breathing.  Imaging revealed multifocal pneumonia within the bilateral lower lobes, lingula, right middle lobe and inferior right upper lobe, airspace consolidation is most significant in the left lower lobe.  She was noted to have AKI on CKD and felt to be volume overloaded as well.  Was hyponatremic,hyperkalemic as well.  This slowly improved with IV diuresis.  Patient now on p.o. torsemide .  Completed treatment for pneumonia.  Hospital course notable for recurrent A fib RVR, started on IV Cardizem  drip 1/23, patient remains in A-fib RVR despite IV Cardizem  drip, started on amiodarone  drip 1/25.

## 2024-11-23 NOTE — Progress Notes (Signed)
 Occupational Therapy Treatment Patient Details Name: Diana Mathews MRN: [id number] DOB: [birth year] Today's Date: 11/23/2024   History of present illness Diana Mathews is a 74 y.o. female with medical history significant for metastatic breast cancer with mets to the bone on letrozole , paroxysmal A-fib on Eliquis , type 2 diabetes, hypertension, hyperlipidemia, iron deficiency anemia, severe morbid obesity, CKD 4, recently admitted from 11/01/2024 through 11/03/2024 for AKI on CKD 4, presents to the ER due to sudden onset shortness of breath tonight.  Her son noticed that she was having increased work of breathing and called EMS.  Upon EMS arrival, the patient was saturating 82% on room air.  She was placed on nonrebreather and brought to the ER for further evaluation.   OT comments  Pt agreeable to OT session. Pt required mod assist for bed mobility with HOB raised. Demonstrated good balance seated EOB- reports dizziness, RN in room and stated that HR was elevated as they just checked this. Pt initially required mod/max assist for sit to stand t/f but after second trial completed with mod assist + RW. Pt completed toilet t/f to [facility] with mod assist + RW. While seated on BSC, pt washed face with set up. She required max assist for toilet hygiene and mod assist to pull brief up. Once pt was seated in chair, OT checked O2 sats- 92%. Pt tolerating sittin up in chair at end of session, RN aware of mobility status. Pt would continue to benefit from OT while in acute setting.       If plan is discharge home, recommend the following:  A lot of help with bathing/dressing/bathroom;Assistance with cooking/housework;Assistance with feeding;Direct supervision/assist for medications management;Assist for transportation;Help with stairs or ramp for entrance;A lot of help with walking and/or transfers   Equipment Recommendations  None recommended by OT       Precautions / Restrictions Precautions Precautions:  Fall Recall of Precautions/Restrictions: Intact Restrictions Weight Bearing Restrictions Per Provider Order: No       Mobility Bed Mobility Overal bed mobility: Needs Assistance Bed Mobility: Supine to Sit     Supine to sit: Min assist, Mod assist, HOB elevated     General bed mobility comments: increased time, labored movement with HOB partially raised    Transfers Overall transfer level: Needs assistance Equipment used: Rolling walker (2 wheels) Transfers: Sit to/from Stand, Bed to chair/wheelchair/BSC Sit to Stand: Mod assist     Step pivot transfers: Min assist     General transfer comment: slow labored movement, fatigues easily     Balance Overall balance assessment: Needs assistance Sitting-balance support: Feet supported, No upper extremity supported Sitting balance-Leahy Scale: Good Sitting balance - Comments: good seated EOB   Standing balance support: Reliant on assistive device for balance, During functional activity, Bilateral upper extremity supported Standing balance-Leahy Scale: Fair Standing balance comment: using RW                           ADL either performed or assessed with clinical judgement   ADL Overall ADL's : Needs assistance/impaired     Grooming: Wash/dry face;Sitting;Set up Grooming Details (indicate cue type and reason): pt washed face sitting on BSC with set up             Lower Body Dressing: Minimal assistance;Sitting/lateral leans Lower Body Dressing Details (indicate cue type and reason): OT donning tip of sock and pt reaching down and pulling sock up Toilet Transfer: Moderate assistance;BSC/3in1;Rolling walker (  2 wheels) Toilet Transfer Details (indicate cue type and reason): mod assist toilet t/f, min assist to side step to  [facility] Toileting- Clothing Manipulation and Hygiene: Moderate assistance;Sit to/from stand Toileting - Clothing Manipulation Details (indicate cue type and reason): mod assist to pull brief  up and down            Extremity/Trunk Assessment Upper Extremity Assessment Upper Extremity Assessment: Overall WFL for tasks assessed   Lower Extremity Assessment Lower Extremity Assessment: Defer to PT evaluation                 Communication Communication Communication: No apparent difficulties   Cognition Arousal: Alert Behavior During Therapy: WFL for tasks assessed/performed Cognition: No apparent impairments                               Following commands: Intact        Cueing   Cueing Techniques: Verbal cues  Exercises              Pertinent Vitals/ Pain       Pain Assessment Pain Assessment: No/denies pain         Frequency  Min 2X/week        Progress Toward Goals  OT Goals(current goals can now be found in the care plan section)  Progress towards OT goals: Progressing toward goals  Acute Rehab OT Goals OT Goal Formulation: Patient unable to participate in goal setting Time For Goal Achievement: 11/29/24 Potential to Achieve Goals: Fair  Plan            End of Session Equipment Utilized During Treatment: Oxygen;Rolling walker (2 wheels);Gait belt  OT Visit Diagnosis: Unsteadiness on feet (R26.81);Other abnormalities of gait and mobility (R26.89);Muscle weakness (generalized) (M62.81);History of falling (Z91.81);Other symptoms and signs involving cognitive function   Activity Tolerance Patient tolerated treatment well   Patient Left in chair;with call bell/phone within reach   Nurse Communication Mobility status        Time: 8471-8381 OT Time Calculation (min): 50 min  Charges: OT General Charges $OT Visit: 1 Visit OT Treatments $Self Care/Home Management : 38-52 mins    Chiquita LOISE Sermon, OTR/L 11/23/2024, 4:42 PM

## 2024-11-23 NOTE — Inpatient Diabetes Management (Signed)
 Inpatient Diabetes Program Recommendations  AACE/ADA: New Consensus Statement on Inpatient Glycemic Control  Target Ranges:  Prepandial:   less than 140 mg/dL      Peak postprandial:   less than 180 mg/dL (1-2 hours)      Critically ill patients:  140 - 180 mg/dL    Latest Reference Range & Units 11/22/24 07:43 11/22/24 11:43 11/22/24 15:52 11/22/24 19:24 11/22/24 23:49 11/23/24 04:46 11/23/24 07:49 11/23/24 11:15  Glucose-Capillary 70 - 99 mg/dL 839 (H)  Novolog  4 units 284 (H)  Novolog  11 units 161 (H)  Novolog  4 units 232 (H)  Novolog  7 units 162 (H)  Novolog  4 units 165 (H)  Novolog  4 units 172 (H)  Novolog  4 units 300 (H)  Novolog  11 units    Review of Glycemic Control  Diabetes history: DM2 Outpatient Diabetes medications: Metformin  750 mg BID, Mounjaro  7.5 mg Qweek Current orders for Inpatient glycemic control: Novolog  0-20 units TID with meals   Inpatient Diabetes Program Recommendations:     Insulin : Patient has received a total of Novolog  45 units for correction over the past 24 hours. Noted CBGs and Novolog  changed from Q4H to AC&HS.   Please consider ordering Semglee  15 units Q24H.   Thanks, Earnie Gainer, RN, MSN, CDCES Diabetes Coordinator Inpatient Diabetes Program [phone] (Team Pager from 8am to 5pm)

## 2024-11-23 NOTE — Progress Notes (Signed)
 Physical Therapy Treatment Patient Details Name: Diana Mathews MRN: [id number] DOB: [birth year] Today's Date: 11/23/2024   History of Present Illness Diana Mathews is a 74 y.o. female with medical history significant for metastatic breast cancer with mets to the bone on letrozole , paroxysmal A-fib on Eliquis , type 2 diabetes, hypertension, hyperlipidemia, iron deficiency anemia, severe morbid obesity, CKD 4, recently admitted from 11/01/2024 through 11/03/2024 for AKI on CKD 4, presents to the ER due to sudden onset shortness of breath tonight.  Her son noticed that she was having increased work of breathing and called EMS.  Upon EMS arrival, the patient was saturating 82% on room air.  She was placed on nonrebreather and brought to the ER for further evaluation.    PT Comments  Patient agreeable for therapy. Patient demonstrates slow labored movement for sitting up at bedside with HOB partially raised, improvement for using BUE to scoot self to EOB and air/good return for completing BLE exercises while seated at bedside. Patient tolerated taking a few steps at bedside before having to sit due to fatigue and poor standing balance. Patient tolerated sitting up in chair after therapy. Patient will benefit from continued skilled physical therapy in hospital and recommended venue below to increase strength, balance, endurance for safe ADLs and gait.       If plan is discharge home, recommend the following: A lot of help with bathing/dressing/bathroom;A lot of help with walking and/or transfers;Help with stairs or ramp for entrance;Assist for transportation;Assistance with cooking/housework   Can travel by private vehicle     No  Equipment Recommendations  None recommended by PT    Recommendations for Other Services       Precautions / Restrictions Precautions Precautions: Fall Recall of Precautions/Restrictions: Intact Restrictions Weight Bearing Restrictions Per Provider Order: No      Mobility  Bed Mobility Overal bed mobility: Needs Assistance Bed Mobility: Supine to Sit     Supine to sit: Min assist, Mod assist, HOB elevated     General bed mobility comments: increased time, labored movement with HOB partially raised    Transfers Overall transfer level: Needs assistance Equipment used: Rolling walker (2 wheels) Transfers: Sit to/from Stand, Bed to chair/wheelchair/BSC Sit to Stand: Mod assist   Step pivot transfers: Mod assist       General transfer comment: slow labored movement, fatigues easily    Ambulation/Gait Ambulation/Gait assistance: Mod assist Gait Distance (Feet): 5 Feet Assistive device: Rolling walker (2 wheels) Gait Pattern/deviations: Decreased step length - right, Decreased step length - left, Decreased stride length, Trunk flexed Gait velocity: slow     General Gait Details: limited to a few slow labored side steps before having to sit due to weakness and c/o fatigue   Stairs             Wheelchair Mobility     Tilt Bed    Modified Rankin (Stroke Patients Only)       Balance Overall balance assessment: Needs assistance Sitting-balance support: Feet supported, No upper extremity supported Sitting balance-Leahy Scale: Fair Sitting balance - Comments: fair/good seated at EOB   Standing balance support: Reliant on assistive device for balance, During functional activity, Bilateral upper extremity supported Standing balance-Leahy Scale: Poor Standing balance comment: using RW                            Communication Communication Communication: No apparent difficulties  Cognition Arousal: Alert Behavior During Therapy: [facility]  for tasks assessed/performed                             Following commands: Intact      Cueing Cueing Techniques: Verbal cues, Tactile cues  Exercises General Exercises - Lower Extremity Long Arc Quad: Seated, AROM, Strengthening, Both, 10 reps Hip  Flexion/Marching: Seated, Strengthening, AROM, Both, 10 reps Toe Raises: Seated, AROM, Strengthening, Both, 10 reps Heel Raises: Seated, AROM, Strengthening, Both, 10 reps    General Comments        Pertinent Vitals/Pain Pain Assessment Pain Assessment: No/denies pain    Home Living                          Prior Function            PT Goals (current goals can now be found in the care plan section) Acute Rehab PT Goals Patient Stated Goal: return home PT Goal Formulation: With patient Time For Goal Achievement: 11/29/24 Potential to Achieve Goals: Good Progress towards PT goals: Progressing toward goals    Frequency    Min 3X/week      PT Plan      Co-evaluation              AM-PAC PT 6 Clicks Mobility   Outcome Measure  Help needed turning from your back to your side while in a flat bed without using bedrails?: A Little Help needed moving from lying on your back to sitting on the side of a flat bed without using bedrails?: A Lot Help needed moving to and from a bed to a chair (including a wheelchair)?: A Lot Help needed standing up from a chair using your arms (e.g., wheelchair or bedside chair)?: A Lot Help needed to walk in hospital room?: A Lot Help needed climbing 3-5 steps with a railing? : Total 6 Click Score: 12    End of Session   Activity Tolerance: Patient tolerated treatment well;Patient limited by fatigue Patient left: in chair;with call bell/phone within reach Nurse Communication: Mobility status PT Visit Diagnosis: Unsteadiness on feet (R26.81);History of falling (Z91.81);Other abnormalities of gait and mobility (R26.89);Difficulty in walking, not elsewhere classified (R26.2)     Time: 8954-8889 PT Time Calculation (min) (ACUTE ONLY): 25 min  Charges:    $Therapeutic Exercise: 8-22 mins $Therapeutic Activity: 8-22 mins PT General Charges $$ ACUTE PT VISIT: 1 Visit                     2:25 PM, 11/23/24 Lynwood Music, MPT Physical Therapist with [facility] [phone] office [phone] mobile phone

## 2024-11-23 NOTE — TOC Progression Note (Signed)
 Transition of Care [facility]) - Progression Note    Patient Details  Name: Diana Mathews MRN: [id number] Date of Birth: 1951-05-18  [facility] CM/SW Contact  Lucie Lunger, [place] Phone Number: 11/23/2024, 11:36 AM  Clinical Narrative:    Insurance shara is requesting updated PT notes. CSW reached out to PT and requested pt be seen again. Once note is in CSW will add it to auth for insurance to review. TOC to follow.   Expected Discharge Plan: Skilled Nursing Facility Barriers to Discharge: Continued Medical Work up               Expected Discharge Plan and Services In-house Referral: Clinical Social Work Discharge Planning Services: CM Consult Post Acute Care Choice: Durable Medical Equipment Living arrangements for the past 2 months: Apartment                                       Social Drivers of Health (SDOH) Interventions SDOH Screenings   Food Insecurity: Patient Unable To Answer (11/15/2024)  Recent Concern: Food Insecurity - Food Insecurity Present (10/05/2024)  Housing: Unknown (11/15/2024)  Transportation Needs: Patient Unable To Answer (11/15/2024)  Recent Concern: Transportation Needs - Unmet Transportation Needs (10/05/2024)  Utilities: Patient Unable To Answer (11/15/2024)  Alcohol  Screen: Low Risk (07/04/2024)  Depression (PHQ2-9): Medium Risk (10/06/2024)  Financial Resource Strain: Low Risk (10/05/2024)  Physical Activity: Sufficiently Active (10/05/2024)  Social Connections: Unknown (11/15/2024)  Recent Concern: Social Connections - Socially Isolated (11/01/2024)  Stress: Stress Concern Present (10/05/2024)  Tobacco Use: Medium Risk (11/14/2024)  Health Literacy: Adequate Health Literacy (07/04/2024)    Readmission Risk Interventions    11/17/2024   12:06 PM 11/16/2024    7:50 AM 11/15/2024   10:43 AM  Readmission Risk Prevention Plan  Transportation Screening Complete Complete Complete  Home Care Screening   Complete  Medication  Review (RN CM)   Complete  HRI or Home Care Consult Complete Complete   Social Work Consult for Recovery Care Planning/Counseling Complete Complete   Palliative Care Screening Not Applicable Not Applicable   Medication Review Oceanographer) Complete Complete

## 2024-11-24 DIAGNOSIS — I4811 Longstanding persistent atrial fibrillation: Secondary | ICD-10-CM | POA: Diagnosis not present

## 2024-11-24 DIAGNOSIS — J9601 Acute respiratory failure with hypoxia: Secondary | ICD-10-CM | POA: Diagnosis not present

## 2024-11-24 DIAGNOSIS — N179 Acute kidney failure, unspecified: Secondary | ICD-10-CM | POA: Diagnosis not present

## 2024-11-24 DIAGNOSIS — I10 Essential (primary) hypertension: Secondary | ICD-10-CM | POA: Diagnosis not present

## 2024-11-24 LAB — CBC WITH DIFFERENTIAL/PLATELET
Abs Immature Granulocytes: 0.09 10*3/uL — ABNORMAL HIGH (ref 0.00–0.07)
Basophils Absolute: 0.1 10*3/uL (ref 0.0–0.1)
Basophils Relative: 1 %
Eosinophils Absolute: 0.2 10*3/uL (ref 0.0–0.5)
Eosinophils Relative: 2 %
HCT: 31.2 % — ABNORMAL LOW (ref 36.0–46.0)
Hemoglobin: 10.5 g/dL — ABNORMAL LOW (ref 12.0–15.0)
Immature Granulocytes: 1 %
Lymphocytes Relative: 14 %
Lymphs Abs: 1.3 10*3/uL (ref 0.7–4.0)
MCH: 33 pg (ref 26.0–34.0)
MCHC: 33.7 g/dL (ref 30.0–36.0)
MCV: 98.1 fL (ref 80.0–100.0)
Monocytes Absolute: 0.7 10*3/uL (ref 0.1–1.0)
Monocytes Relative: 7 %
Neutro Abs: 7.2 10*3/uL (ref 1.7–7.7)
Neutrophils Relative %: 75 %
Platelets: 356 10*3/uL (ref 150–400)
RBC: 3.18 MIL/uL — ABNORMAL LOW (ref 3.87–5.11)
RDW: 16.6 % — ABNORMAL HIGH (ref 11.5–15.5)
WBC: 9.6 10*3/uL (ref 4.0–10.5)
nRBC: 0 % (ref 0.0–0.2)

## 2024-11-24 LAB — MAGNESIUM: Magnesium: 1.7 mg/dL (ref 1.7–2.4)

## 2024-11-24 LAB — GLUCOSE, CAPILLARY
Glucose-Capillary: 202 mg/dL — ABNORMAL HIGH (ref 70–99)
Glucose-Capillary: 193 mg/dL — ABNORMAL HIGH (ref 70–99)
Glucose-Capillary: 295 mg/dL — ABNORMAL HIGH (ref 70–99)
Glucose-Capillary: 211 mg/dL — ABNORMAL HIGH (ref 70–99)
Glucose-Capillary: 72 mg/dL (ref 70–99)
Glucose-Capillary: 102 mg/dL — ABNORMAL HIGH (ref 70–99)

## 2024-11-24 MED ORDER — INSULIN GLARGINE-YFGN 100 UNIT/ML ~~LOC~~ SOLN
15.0000 [IU] | Freq: Every day | SUBCUTANEOUS | Status: DC
  Administered 2024-11-25: 15 [IU] via SUBCUTANEOUS
  Filled 2024-11-24 (×2): qty 0.15

## 2024-11-24 MED ORDER — MAGNESIUM SULFATE 2 GM/50ML IV SOLN
2.0000 g | Freq: Once | INTRAVENOUS | Status: AC
  Administered 2024-11-24: 2 g via INTRAVENOUS
  Filled 2024-11-24: qty 50

## 2024-11-24 MED ORDER — INSULIN ASPART 100 UNIT/ML IJ SOLN
6.0000 [IU] | Freq: Three times a day (TID) | INTRAMUSCULAR | Status: DC
  Administered 2024-11-24 – 2024-11-25 (×3): 6 [IU] via SUBCUTANEOUS
  Filled 2024-11-24 (×5): qty 1

## 2024-11-24 MED ORDER — INSULIN GLARGINE-YFGN 100 UNIT/ML ~~LOC~~ SOLN
15.0000 [IU] | Freq: Once | SUBCUTANEOUS | Status: AC
  Administered 2024-11-24: 15 [IU] via SUBCUTANEOUS
  Filled 2024-11-24: qty 0.15

## 2024-11-24 NOTE — Plan of Care (Signed)
   Problem: Education: Goal: Knowledge of General Education information will improve Description Including pain rating scale, medication(s)/side effects and non-pharmacologic comfort measures Outcome: Progressing

## 2024-11-24 NOTE — Progress Notes (Signed)
 " PROGRESS NOTE   Diana Mathews  FMW:[id number] DOB: Sep 14, 1951 DOA: 11/14/2024 PCP: Zollie Lowers, MD   Chief Complaint  Patient presents with   Shortness of Breath   Level of care: Telemetry  Brief Admission History:  74 y.o. female with medical history significant for metastatic breast cancer with mets to the bone on letrozole , paroxysmal A-fib on Eliquis , type 2 diabetes, hypertension, hyperlipidemia, iron deficiency anemia, severe morbid obesity, CKD 4, recently admitted from 11/01/2024 through 11/03/2024 for AKI on CKD 4, presents to the ER due to sudden onset shortness of breath .Her son noticed that she was having increased work of breathing and called EMS.  Upon EMS arrival, the patient was saturating 82% on room air.  She was placed on nonrebreather and brought to the ER for further evaluation.   In the ER, tachycardic and tachypneic.  The patient was placed on BiPAP due to increased work of breathing.  Imaging revealed multifocal pneumonia within the bilateral lower lobes, lingula, right middle lobe and inferior right upper lobe, airspace consolidation is most significant in the left lower lobe.  She was noted to have AKI on CKD and felt to be volume overloaded as well.  Was hyponatremic,hyperkalemic as well.  This slowly improved with IV diuresis.  Patient now on p.o. torsemide .  Completed treatment for pneumonia.  Hospital course notable for recurrent A fib RVR, started on IV Cardizem  drip 1/23, patient remains in A-fib RVR despite IV Cardizem  drip, started on amiodarone  drip 1/25.   Assessment and Plan:  Acute hypoxic respiratory failure secondary to below Multifocal pneumonia Volume overload  not on oxygen supplementation at baseline. Reportedly with O2 saturation of 82% on room air, on admission,  continue supplemental oxygen through nasal cannula.  Down to 4 to 6 L/min.  Out of bed, PT, incentive spirometry Will continue BiPAP at night and as needed during daytime, will wean  down as tolerated Completed IV ceftriaxone  and doxycycline  for 7 days for pneumonia.   Volume off with IV Lasix ---> switched to p.o. torsemide  1/23, appreciate nephrology input.   Elevated troponin in the setting of the above, suspect demand ischemia High sensitive troponin flat 25, 25 No evidence of acute ischemia on 12 lead EKG Monitor on telemetry Echocardiogram obtained with poor study, LV function likely low normal   Hyponatremia/hyperkalemia/AKI on CKD stage III  Presented with serum sodium of 117 and glucose of 354.  Corrected sodium 121, potassium 5.6. They have improved with diuresis.  Sodium 141, potassium 3.8, creatinine 1.64 on 1/26. Nephrology has signed off   Type 2 diabetes with hyperglycemia Presented with serum glucose of 354. Hemoglobin A1c 8.3 on 11/01/2024. Insulin  doses adjusted further today.  CBG (last 3)  Recent Labs    11/24/24 0309 11/24/24 0719 11/24/24 1109  GLUCAP 202* 193* 295*    Paroxysmal A-fib on Eliquis /rapid ventricular response - Increased home oral Cardizem  360 mg daily, increased metoprolol  50 mg TID.   - Started on Cardizem  drip 1/23, but rate remained uncontrolled and subsequently started on Amiodarone  bolus and maintenance drip and subsequently discontinued by cardiology team.  Continue apixaban  for full dose anticoagulation  Pt now is off amiodarone .     Anemia of chronic disease Hemoglobin 9.4 No reported overt bleeding Continue to monitor H&H.   Class III Obesity BMI 40 Recommend weight loss outpatient with regular physical activity and healthy dieting.   Metastatic breast cancer with bone metastasis Currently on letrozole , prior to admission Close follow-up appointment with oncology.  Physical deconditioning: Needs SNF  Behavioral health  Continue home medications including BuSpar , Abilify .   DVT prophylaxis: apixaban   Code Status: DNR  Family Communication:  Disposition: DC to SNF tomorrow   Consultants:    Procedures:   Antimicrobials:    Subjective: Pt reports no palpitations or SOB.    Objective: Vitals:   11/23/24 2010 11/23/24 2143 11/24/24 0127 11/24/24 0805  BP:  133/81 (!) 110/50   Pulse:  95 91   Resp:  18 20   Temp:  98.3 F (36.8 C) 98.3 F (36.8 C)   TempSrc:      SpO2: 92% 93% 93% 91%  Weight:      Height:       No intake or output data in the 24 hours ending 11/24/24 1251  Filed Weights   11/14/24 2020 11/14/24 2356 11/21/24 0356  Weight: 107.5 kg 113.8 kg 103.4 kg   Examination:  General exam: Appears calm and comfortable  Respiratory system: Clear to auscultation. Respiratory effort normal. Cardiovascular system: normal S1 & S2 heard. No JVD, murmurs, rubs, gallops or clicks. No pedal edema. Gastrointestinal system: Abdomen is nondistended, soft and nontender. No organomegaly or masses felt. Normal bowel sounds heard. Central nervous system: Alert and oriented. No focal neurological deficits. Extremities: Symmetric 5 x 5 power. Skin: No rashes, lesions or ulcers. Psychiatry: Judgement and insight appear normal. Mood & affect appropriate.   Data Reviewed: I have personally reviewed following labs and imaging studies  CBC: Recent Labs  Lab 11/20/24 0321 11/21/24 0341 11/22/24 0419 11/23/24 0439 11/24/24 0427  WBC 10.2 9.4 10.1 10.1 9.6  NEUTROABS 7.0 6.7 7.5 7.5 7.2  HGB 9.6* 9.3* 9.7* 10.4* 10.5*  HCT 28.7* 27.5* 28.9* 31.2* 31.2*  MCV 97.6 98.6 98.0 99.7 98.1  PLT 246 229 239 296 356    Basic Metabolic Panel: Recent Labs  Lab 11/18/24 0245 11/18/24 0245 11/19/24 0327 11/20/24 0321 11/21/24 0341 11/22/24 0419 11/23/24 0439 11/24/24 0427  NA 135  --  139 140 142 141 143  --   K 4.3  --  3.6 4.0 3.6 3.8 3.6  --   CL 97*  --  101 98 98 98 97*  --   CO2 24  --  27 27 28 30 31   --   GLUCOSE 171*  --  224* 174* 164* 191* 169*  --   BUN 38*  --  35* 41* 52* 49* 44*  --   CREATININE 2.13*  --  1.60* 1.76* 1.82* 1.64* 1.48*  --    CALCIUM  8.9  --  8.9 9.1 8.5* 8.6* 8.8*  --   MG  --    < > 1.3* 2.0 1.8 1.8 1.7 1.7  PHOS 2.7  --   --   --   --   --   --   --    < > = values in this interval not displayed.    CBG: Recent Labs  Lab 11/23/24 1620 11/23/24 2103 11/24/24 0309 11/24/24 0719 11/24/24 1109  GLUCAP 283* 183* 202* 193* 295*    Recent Results (from the past 240 hours)  Resp panel by RT-PCR (RSV, Flu A&B, Covid) Anterior Nasal Swab     Status: None   Collection Time: 11/14/24  8:39 PM   Specimen: Anterior Nasal Swab  Result Value Ref Range Status   SARS Coronavirus 2 by RT PCR NEGATIVE NEGATIVE Final    Comment: (NOTE) SARS-CoV-2 target nucleic acids are NOT DETECTED.  The SARS-CoV-2 RNA  is generally detectable in upper respiratory specimens during the acute phase of infection. The lowest concentration of SARS-CoV-2 viral copies this assay can detect is 138 copies/mL. A negative result does not preclude SARS-Cov-2 infection and should not be used as the sole basis for treatment or other patient management decisions. A negative result may occur with  improper specimen collection/handling, submission of specimen other than nasopharyngeal swab, presence of viral mutation(s) within the areas targeted by this assay, and inadequate number of viral copies(<138 copies/mL). A negative result must be combined with clinical observations, patient history, and epidemiological information. The expected result is Negative.  Fact Sheet for Patients:  bloggercourse.com  Fact Sheet for Healthcare Providers:  seriousbroker.it  This test is no t yet approved or cleared by the [place]  FDA and  has been authorized for detection and/or diagnosis of SARS-CoV-2 by FDA under an Emergency Use Authorization (EUA). This EUA will remain  in effect (meaning this test can be used) for the duration of the COVID-19 declaration under Section 564(b)(1) of the Act,  21 U.S.C.section 360bbb-3(b)(1), unless the authorization is terminated  or revoked sooner.       Influenza A by PCR NEGATIVE NEGATIVE Final   Influenza B by PCR NEGATIVE NEGATIVE Final    Comment: (NOTE) The Xpert Xpress SARS-CoV-2/FLU/RSV plus assay is intended as an aid in the diagnosis of influenza from Nasopharyngeal swab specimens and should not be used as a sole basis for treatment. Nasal washings and aspirates are unacceptable for Xpert Xpress SARS-CoV-2/FLU/RSV testing.  Fact Sheet for Patients: bloggercourse.com  Fact Sheet for Healthcare Providers: seriousbroker.it  This test is not yet approved or cleared by the [place]  FDA and has been authorized for detection and/or diagnosis of SARS-CoV-2 by FDA under an Emergency Use Authorization (EUA). This EUA will remain in effect (meaning this test can be used) for the duration of the COVID-19 declaration under Section 564(b)(1) of the Act, 21 U.S.C. section 360bbb-3(b)(1), unless the authorization is terminated or revoked.     Resp Syncytial Virus by PCR NEGATIVE NEGATIVE Final    Comment: (NOTE) Fact Sheet for Patients: bloggercourse.com  Fact Sheet for Healthcare Providers: seriousbroker.it  This test is not yet approved or cleared by the [place]  FDA and has been authorized for detection and/or diagnosis of SARS-CoV-2 by FDA under an Emergency Use Authorization (EUA). This EUA will remain in effect (meaning this test can be used) for the duration of the COVID-19 declaration under Section 564(b)(1) of the Act, 21 U.S.C. section 360bbb-3(b)(1), unless the authorization is terminated or revoked.  Performed at [facility], [address], [place], [place] [address]   MRSA Next Gen by PCR, Nasal     Status: Abnormal   Collection Time: 11/15/24 12:06 AM   Specimen: Nasal Mucosa; Nasal Swab  Result Value Ref  Range Status   MRSA by PCR Next Gen DETECTED (A) NOT DETECTED Final    Comment: RESULT CALLED TO, READ BACK BY AND VERIFIED WITH: N RONE,RN@0338  11/15/24 MK (NOTE) The GeneXpert MRSA Assay (FDA approved for NASAL specimens only), is one component of a comprehensive MRSA colonization surveillance program. It is not intended to diagnose MRSA infection nor to guide or monitor treatment for MRSA infections. Test performance is not FDA approved in patients less than 55 years old. Performed at [facility], [address] [place], [place] [address]      Radiology Studies: No results found.  Scheduled Meds:  apixaban   5 mg Oral BID  ARIPiprazole   5 mg Oral Daily   atorvastatin   40 mg Oral Daily   busPIRone   10 mg Oral TID   Chlorhexidine  Gluconate Cloth  6 each Topical Daily   diltiazem   360 mg Oral Daily   DULoxetine   60 mg Oral Daily   feeding supplement  237 mL Oral BID BM   Gerhardt's butt cream   Topical BID   insulin  aspart  0-20 Units Subcutaneous TID WC   insulin  aspart  6 Units Subcutaneous TID WC   [START ON 11/25/2024] insulin  glargine-yfgn  15 Units Subcutaneous Daily   insulin  glargine-yfgn  15 Units Subcutaneous Once   ipratropium-albuterol   3 mL Nebulization TID   letrozole   2.5 mg Oral Daily   magnesium  oxide  200 mg Oral Daily   metoprolol  tartrate  50 mg Oral TID   mouth rinse  15 mL Mouth Rinse 4 times per day   rOPINIRole   1 mg Oral QHS   spironolactone   25 mg Oral Daily   torsemide   40 mg Oral Daily   Continuous Infusions:  magnesium  sulfate bolus IVPB       LOS: 10 days   Time spent: 55 mins  Christain Niznik Vicci, MD How to contact the [facility] Attending or Consulting provider 7A - 7P or covering provider during after hours 7P -7A, for this patient?  Check the care team in [facility] and look for a) attending/consulting TRH provider listed and b) the TRH team listed Log into [email or web address] to find provider on call.  Locate the TRH provider you are looking for under  [facility] and page to a number that you can be directly reached. If you still have difficulty reaching the provider, please page the [facility] (Director on Call) for the Hospitalists listed on amion for assistance.  11/24/2024, 12:51 PM    "

## 2024-11-24 NOTE — Plan of Care (Signed)

## 2024-11-24 NOTE — Progress Notes (Signed)
 Mobility Specialist Progress Note:    11/24/24 1405  Mobility  Activity Pivoted/transferred from chair to bed  Level of Assistance Minimal assist, patient does 75% or more  Assistive Device Front wheel walker  Distance Ambulated (ft) 3 ft  Range of Motion/Exercises Active;All extremities  Activity Response Tolerated well  Mobility Referral Yes  Mobility visit 1 Mobility  Mobility Specialist Start Time (ACUTE ONLY) 1405  Mobility Specialist Stop Time (ACUTE ONLY) 1430  Mobility Specialist Time Calculation (min) (ACUTE ONLY) 25 min   Pt received in chair, requesting assistance to bed. Required MinA to stand and transfer with RW. Once seated EOB she performed 10 reps of heel to toe raises and knee marches. Tolerated well, c/o headache. Left supine, alarm on. All needs met.  Diana Mathews Mobility Specialist Please contact via Special Educational Needs Teacher or  Rehab office at [phone]

## 2024-11-24 NOTE — TOC Progression Note (Signed)
 Transition of Care [facility]) - Progression Note    Patient Details  Name: Diana Mathews MRN: [id number] Date of Birth: [birth year]  Transition of Care [facility] CM/SW Contact  Lucie Lunger, [place] Phone Number: 11/24/2024, 12:02 PM  Clinical Narrative:    CSW notes that SNF insurance shara is approved fro SNF at [facility]. CSW spoke to Debbie in admissions who confirms they will have a bed for pt tomorrow. MD updated on plan. TOC to follow.   Expected Discharge Plan: Skilled Nursing Facility Barriers to Discharge: Continued Medical Work up               Expected Discharge Plan and Services In-house Referral: Clinical Social Work Discharge Planning Services: CM Consult Post Acute Care Choice: Durable Medical Equipment Living arrangements for the past 2 months: Apartment                                       Social Drivers of Health (SDOH) Interventions SDOH Screenings   Food Insecurity: Patient Unable To Answer (11/15/2024)  Recent Concern: Food Insecurity - Food Insecurity Present (10/05/2024)  Housing: Unknown (11/15/2024)  Transportation Needs: Patient Unable To Answer (11/15/2024)  Recent Concern: Transportation Needs - Unmet Transportation Needs (10/05/2024)  Utilities: Patient Unable To Answer (11/15/2024)  Alcohol  Screen: Low Risk (07/04/2024)  Depression (PHQ2-9): Medium Risk (10/06/2024)  Financial Resource Strain: Low Risk (10/05/2024)  Physical Activity: Sufficiently Active (10/05/2024)  Social Connections: Unknown (11/15/2024)  Recent Concern: Social Connections - Socially Isolated (11/01/2024)  Stress: Stress Concern Present (10/05/2024)  Tobacco Use: Medium Risk (11/14/2024)  Health Literacy: Adequate Health Literacy (07/04/2024)    Readmission Risk Interventions    11/17/2024   12:06 PM 11/16/2024    7:50 AM 11/15/2024   10:43 AM  Readmission Risk Prevention Plan  Transportation Screening Complete Complete Complete  Home Care Screening   Complete   Medication Review (RN CM)   Complete  HRI or Home Care Consult Complete Complete   Social Work Consult for Recovery Care Planning/Counseling Complete Complete   Palliative Care Screening Not Applicable Not Applicable   Medication Review Oceanographer) Complete Complete

## 2024-11-25 DIAGNOSIS — J9601 Acute respiratory failure with hypoxia: Secondary | ICD-10-CM | POA: Diagnosis not present

## 2024-11-25 DIAGNOSIS — N179 Acute kidney failure, unspecified: Secondary | ICD-10-CM | POA: Diagnosis not present

## 2024-11-25 DIAGNOSIS — E119 Type 2 diabetes mellitus without complications: Secondary | ICD-10-CM | POA: Diagnosis not present

## 2024-11-25 DIAGNOSIS — I10 Essential (primary) hypertension: Secondary | ICD-10-CM | POA: Diagnosis not present

## 2024-11-25 DIAGNOSIS — Z7985 Long-term (current) use of injectable non-insulin antidiabetic drugs: Secondary | ICD-10-CM

## 2024-11-25 LAB — GLUCOSE, CAPILLARY
Glucose-Capillary: 142 mg/dL — ABNORMAL HIGH (ref 70–99)
Glucose-Capillary: 166 mg/dL — ABNORMAL HIGH (ref 70–99)
Glucose-Capillary: 325 mg/dL — ABNORMAL HIGH (ref 70–99)
Glucose-Capillary: 194 mg/dL — ABNORMAL HIGH (ref 70–99)

## 2024-11-25 MED ORDER — GABAPENTIN 300 MG PO CAPS: 300.0000 mg | ORAL_CAPSULE | Freq: Three times a day (TID) | ORAL | 0 refills | Status: AC | PRN

## 2024-11-25 MED ORDER — INSULIN GLARGINE 100 UNIT/ML SOLOSTAR PEN: 12.0000 [IU] | PEN_INJECTOR | Freq: Every day | SUBCUTANEOUS | Status: AC

## 2024-11-25 MED ORDER — BACLOFEN 10 MG PO TABS: 10.0000 mg | ORAL_TABLET | Freq: Three times a day (TID) | ORAL | Status: AC | PRN

## 2024-11-25 MED ORDER — DILTIAZEM HCL ER COATED BEADS 360 MG PO CP24: 360.0000 mg | ORAL_CAPSULE | Freq: Every day | ORAL | Status: AC

## 2024-11-25 MED ORDER — IPRATROPIUM-ALBUTEROL 0.5-2.5 (3) MG/3ML IN SOLN: 3.0000 mL | RESPIRATORY_TRACT | Status: AC | PRN

## 2024-11-25 MED ORDER — HYDROCODONE-ACETAMINOPHEN 10-325 MG PO TABS: 1.0000 | ORAL_TABLET | Freq: Four times a day (QID) | ORAL | 0 refills | Status: AC | PRN

## 2024-11-25 MED ORDER — GERHARDT'S BUTT CREAM: 1.0000 | TOPICAL_CREAM | Freq: Two times a day (BID) | CUTANEOUS | Status: AC

## 2024-11-25 MED ORDER — IPRATROPIUM-ALBUTEROL 0.5-2.5 (3) MG/3ML IN SOLN: 3.0000 mL | Freq: Three times a day (TID) | RESPIRATORY_TRACT | Status: AC

## 2024-11-25 MED ORDER — TORSEMIDE 40 MG PO TABS: 40.0000 mg | ORAL_TABLET | Freq: Every day | ORAL | Status: AC

## 2024-11-25 MED ORDER — ACETAMINOPHEN 500 MG PO TABS: 1000.0000 mg | ORAL_TABLET | Freq: Four times a day (QID) | ORAL | Status: AC | PRN

## 2024-11-25 MED ORDER — POTASSIUM CHLORIDE CRYS ER 10 MEQ PO TBCR: 10.0000 meq | EXTENDED_RELEASE_TABLET | Freq: Every day | ORAL | Status: AC

## 2024-11-25 MED ORDER — METOPROLOL TARTRATE 50 MG PO TABS: 50.0000 mg | ORAL_TABLET | Freq: Three times a day (TID) | ORAL | Status: AC

## 2024-11-25 NOTE — Care Management Important Message (Signed)
 Important Message  Patient Details  Name: Diana Mathews MRN: [id number] Date of Birth: [birth year]   Important Message Given:  Yes - Medicare IM     Alizandra Loh L Shalina Norfolk 11/25/2024, 12:27 PM

## 2024-11-25 NOTE — TOC Transition Note (Signed)
 Transition of Care [facility]) - Discharge Note   Patient Details  Name: Diana Mathews MRN: [id number] Date of Birth: 1951-06-15  [facility] CM/SW Contact:  Lucie Lunger, LCSWA Phone Number: 11/25/2024, 1:27 PM  Clinical Narrative:    CSW updated that insurance shara is approved for SNF at [facility]. CSW spoke to Debbie in admissions who states pt can arrive to facility today. D/C clinicals sent to facility via HUB. RN provided with room and report numbers. Med necessity printed to floor for RN. EMS called for transport. CSW updated pts daughter of plan for D/C. TOC signing off.   Final next level of care: Skilled Nursing Facility Barriers to Discharge: Barriers Resolved   Patient Goals and CMS Choice Patient states their goals for this hospitalization and ongoing recovery are:: go to SNF CMS Medicare.gov Compare Post Acute Care list provided to:: Patient Represenative (must comment) Choice offered to / list presented to : Adult Children Carlos ownership interest in [facility].provided to:: Adult Children    Discharge Placement                Patient to be transferred to facility by: EMS Name of family member notified: [name] Patient and family notified of of transfer: 11/25/24  Discharge Plan and Services Additional resources added to the After Visit Summary for   In-house Referral: Clinical Social Work Discharge Planning Services: CM Consult Post Acute Care Choice: Durable Medical Equipment                               Social Drivers of Health (SDOH) Interventions SDOH Screenings   Food Insecurity: Patient Unable To Answer (11/15/2024)  Recent Concern: Food Insecurity - Food Insecurity Present (10/05/2024)  Housing: Unknown (11/15/2024)  Transportation Needs: Patient Unable To Answer (11/15/2024)  Recent Concern: Transportation Needs - Unmet Transportation Needs (10/05/2024)  Utilities: Patient Unable To Answer (11/15/2024)   Alcohol  Screen: Low Risk (07/04/2024)  Depression (PHQ2-9): Medium Risk (10/06/2024)  Financial Resource Strain: Low Risk (10/05/2024)  Physical Activity: Sufficiently Active (10/05/2024)  Social Connections: Unknown (11/15/2024)  Recent Concern: Social Connections - Socially Isolated (11/01/2024)  Stress: Stress Concern Present (10/05/2024)  Tobacco Use: Medium Risk (11/14/2024)  Health Literacy: Adequate Health Literacy (07/04/2024)     Readmission Risk Interventions    11/17/2024   12:06 PM 11/16/2024    7:50 AM 11/15/2024   10:43 AM  Readmission Risk Prevention Plan  Transportation Screening Complete Complete Complete  Home Care Screening   Complete  Medication Review (RN CM)   Complete  HRI or Home Care Consult Complete Complete   Social Work Consult for Recovery Care Planning/Counseling Complete Complete   Palliative Care Screening Not Applicable Not Applicable   Medication Review Ocean[employer]) Complete Complete

## 2024-11-25 NOTE — Discharge Instructions (Signed)
IMPORTANT INFORMATION: PAY CLOSE ATTENTION  ? ?PHYSICIAN DISCHARGE INSTRUCTIONS ? ?Follow with Primary care provider  Stacks, Warren, MD  and other consultants as instructed by your Hospitalist Physician ? ?SEEK MEDICAL CARE OR RETURN TO EMERGENCY ROOM IF SYMPTOMS COME BACK, WORSEN OR NEW PROBLEM DEVELOPS  ? ?Please note: ?You were cared for by a hospitalist during your hospital stay. Every effort will be made to forward records to your primary care provider.  You can request that your primary care provider send for your hospital records if they have not received them.  Once you are discharged, your primary care physician will handle any further medical issues. Please note that NO REFILLS for any discharge medications will be authorized once you are discharged, as it is imperative that you return to your primary care physician (or establish a relationship with a primary care physician if you do not have one) for your post hospital discharge needs so that they can reassess your need for medications and monitor your lab values. ? ?Please get a complete blood count and chemistry panel checked by your Primary MD at your next visit, and again as instructed by your Primary MD. ? ?Get Medicines reviewed and adjusted: ?Please take all your medications with you for your next visit with your Primary MD ? ?Laboratory/radiological data: ?Please request your Primary MD to go over all hospital tests and procedure/radiological results at the follow up, please ask your primary care provider to get all Hospital records sent to his/her office. ? ?In some cases, they will be blood work, cultures and biopsy results pending at the time of your discharge. Please request that your primary care provider follow up on these results. ? ?If you are diabetic, please bring your blood sugar readings with you to your follow up appointment with primary care.   ? ?Please call and make your follow up appointments as soon as possible.   ? ?Also Note  the following: ?If you experience worsening of your admission symptoms, develop shortness of breath, life threatening emergency, suicidal or homicidal thoughts you must seek medical attention immediately by calling 911 or calling your MD immediately  if symptoms less severe. ? ?You must read complete instructions/literature along with all the possible adverse reactions/side effects for all the Medicines you take and that have been prescribed to you. Take any new Medicines after you have completely understood and accpet all the possible adverse reactions/side effects.  ? ?Do not drive when taking Pain medications or sleeping medications (Benzodiazepines) ? ?Do not take more than prescribed Pain, Sleep and Anxiety Medications. It is not advisable to combine anxiety,sleep and pain medications without talking with your primary care practitioner ? ?Special Instructions: If you have smoked or chewed Tobacco  in the last 2 yrs please stop smoking, stop any regular Alcohol  and or any Recreational drug use. ? ?Wear Seat belts while driving.  Do not drive if taking any narcotic, mind altering or controlled substances or recreational drugs or alcohol.  ? ? ? ? ? ?

## 2025-01-05 ENCOUNTER — Ambulatory Visit: Admitting: Family Medicine
